# Patient Record
Sex: Female | Born: 1965
Health system: Southern US, Community
[De-identification: ages and names within clinical notes are randomized; demographics above are authoritative.]

## PROBLEM LIST (undated history)

## (undated) DIAGNOSIS — G4730 Sleep apnea, unspecified: Secondary | ICD-10-CM

## (undated) DIAGNOSIS — IMO0001 Reserved for inherently not codable concepts without codable children: Secondary | ICD-10-CM

## (undated) DIAGNOSIS — N951 Menopausal and female climacteric states: Secondary | ICD-10-CM

## (undated) DIAGNOSIS — Z8719 Personal history of other diseases of the digestive system: Secondary | ICD-10-CM

## (undated) DIAGNOSIS — E039 Hypothyroidism, unspecified: Secondary | ICD-10-CM

## (undated) DIAGNOSIS — K219 Gastro-esophageal reflux disease without esophagitis: Secondary | ICD-10-CM

## (undated) DIAGNOSIS — R7303 Prediabetes: Secondary | ICD-10-CM

## (undated) DIAGNOSIS — R112 Nausea with vomiting, unspecified: Secondary | ICD-10-CM

## (undated) DIAGNOSIS — B977 Papillomavirus as the cause of diseases classified elsewhere: Secondary | ICD-10-CM

## (undated) DIAGNOSIS — E785 Hyperlipidemia, unspecified: Secondary | ICD-10-CM

## (undated) DIAGNOSIS — N393 Stress incontinence (female) (male): Secondary | ICD-10-CM

## (undated) DIAGNOSIS — E119 Type 2 diabetes mellitus without complications: Secondary | ICD-10-CM

## (undated) DIAGNOSIS — F329 Major depressive disorder, single episode, unspecified: Secondary | ICD-10-CM

## (undated) DIAGNOSIS — F32A Depression, unspecified: Secondary | ICD-10-CM

## (undated) DIAGNOSIS — F909 Attention-deficit hyperactivity disorder, unspecified type: Secondary | ICD-10-CM

## (undated) DIAGNOSIS — S83289A Other tear of lateral meniscus, current injury, unspecified knee, initial encounter: Secondary | ICD-10-CM

## (undated) DIAGNOSIS — M1990 Unspecified osteoarthritis, unspecified site: Secondary | ICD-10-CM

## (undated) DIAGNOSIS — Z9889 Other specified postprocedural states: Secondary | ICD-10-CM

## (undated) DIAGNOSIS — F419 Anxiety disorder, unspecified: Secondary | ICD-10-CM

## (undated) HISTORY — PX: WISDOM TOOTH EXTRACTION: SHX21

## (undated) HISTORY — DX: Type 2 diabetes mellitus without complications: E11.9

## (undated) HISTORY — DX: Hyperlipidemia, unspecified: E78.5

## (undated) HISTORY — DX: Sleep apnea, unspecified: G47.30

## (undated) HISTORY — PX: OTHER SURGICAL HISTORY: SHX169

## (undated) HISTORY — DX: Menopausal and female climacteric states: N95.1

## (undated) HISTORY — DX: Anxiety disorder, unspecified: F41.9

## (undated) HISTORY — DX: Major depressive disorder, single episode, unspecified: F32.9

## (undated) HISTORY — DX: Depression, unspecified: F32.A

## (undated) HISTORY — PX: APPENDECTOMY: SHX54

---

## 1997-12-01 ENCOUNTER — Ambulatory Visit (HOSPITAL_COMMUNITY): Admission: RE | Admit: 1997-12-01 | Discharge: 1997-12-01 | Payer: Self-pay | Admitting: *Deleted

## 1999-04-04 ENCOUNTER — Other Ambulatory Visit: Admission: RE | Admit: 1999-04-04 | Discharge: 1999-04-04 | Payer: Self-pay | Admitting: Podiatry

## 1999-04-04 HISTORY — PX: EXCISION MORTON'S NEUROMA: SHX5013

## 1999-11-16 ENCOUNTER — Emergency Department (HOSPITAL_COMMUNITY): Admission: EM | Admit: 1999-11-16 | Discharge: 1999-11-16 | Payer: Self-pay | Admitting: Emergency Medicine

## 2000-06-05 ENCOUNTER — Other Ambulatory Visit: Admission: RE | Admit: 2000-06-05 | Discharge: 2000-06-05 | Payer: Self-pay | Admitting: *Deleted

## 2000-12-17 ENCOUNTER — Other Ambulatory Visit: Admission: RE | Admit: 2000-12-17 | Discharge: 2000-12-17 | Payer: Self-pay | Admitting: *Deleted

## 2000-12-17 ENCOUNTER — Encounter (INDEPENDENT_AMBULATORY_CARE_PROVIDER_SITE_OTHER): Payer: Self-pay

## 2001-05-10 ENCOUNTER — Encounter (INDEPENDENT_AMBULATORY_CARE_PROVIDER_SITE_OTHER): Payer: Self-pay | Admitting: Specialist

## 2001-05-10 ENCOUNTER — Ambulatory Visit (HOSPITAL_COMMUNITY): Admission: RE | Admit: 2001-05-10 | Discharge: 2001-05-10 | Payer: Self-pay | Admitting: *Deleted

## 2001-07-19 ENCOUNTER — Other Ambulatory Visit: Admission: RE | Admit: 2001-07-19 | Discharge: 2001-07-19 | Payer: Self-pay | Admitting: *Deleted

## 2001-11-17 ENCOUNTER — Encounter: Admission: RE | Admit: 2001-11-17 | Discharge: 2001-11-17 | Payer: Self-pay | Admitting: *Deleted

## 2002-02-16 ENCOUNTER — Emergency Department (HOSPITAL_COMMUNITY): Admission: EM | Admit: 2002-02-16 | Discharge: 2002-02-16 | Payer: Self-pay | Admitting: Emergency Medicine

## 2002-02-16 ENCOUNTER — Encounter: Payer: Self-pay | Admitting: Emergency Medicine

## 2002-03-23 ENCOUNTER — Encounter: Payer: Self-pay | Admitting: Emergency Medicine

## 2002-03-23 ENCOUNTER — Emergency Department (HOSPITAL_COMMUNITY): Admission: EM | Admit: 2002-03-23 | Discharge: 2002-03-23 | Payer: Self-pay | Admitting: *Deleted

## 2002-04-01 ENCOUNTER — Emergency Department (HOSPITAL_COMMUNITY): Admission: EM | Admit: 2002-04-01 | Discharge: 2002-04-01 | Payer: Self-pay | Admitting: Emergency Medicine

## 2002-07-21 ENCOUNTER — Other Ambulatory Visit: Admission: RE | Admit: 2002-07-21 | Discharge: 2002-07-21 | Payer: Self-pay | Admitting: *Deleted

## 2003-05-18 ENCOUNTER — Inpatient Hospital Stay (HOSPITAL_COMMUNITY): Admission: AD | Admit: 2003-05-18 | Discharge: 2003-05-18 | Payer: Self-pay | Admitting: Obstetrics and Gynecology

## 2003-05-18 ENCOUNTER — Encounter: Payer: Self-pay | Admitting: Obstetrics and Gynecology

## 2003-09-11 ENCOUNTER — Inpatient Hospital Stay (HOSPITAL_COMMUNITY): Admission: AD | Admit: 2003-09-11 | Discharge: 2003-09-15 | Payer: Self-pay | Admitting: *Deleted

## 2003-10-06 ENCOUNTER — Other Ambulatory Visit: Admission: RE | Admit: 2003-10-06 | Discharge: 2003-10-06 | Payer: Self-pay | Admitting: Obstetrics and Gynecology

## 2004-12-08 ENCOUNTER — Emergency Department (HOSPITAL_COMMUNITY): Admission: EM | Admit: 2004-12-08 | Discharge: 2004-12-08 | Payer: Self-pay | Admitting: Emergency Medicine

## 2005-03-05 ENCOUNTER — Ambulatory Visit: Payer: Self-pay | Admitting: Gastroenterology

## 2005-03-26 ENCOUNTER — Ambulatory Visit: Payer: Self-pay | Admitting: Gastroenterology

## 2005-03-31 ENCOUNTER — Ambulatory Visit: Payer: Self-pay | Admitting: Gastroenterology

## 2005-04-03 ENCOUNTER — Ambulatory Visit: Payer: Self-pay | Admitting: Gastroenterology

## 2005-04-03 ENCOUNTER — Encounter (INDEPENDENT_AMBULATORY_CARE_PROVIDER_SITE_OTHER): Payer: Self-pay | Admitting: Specialist

## 2005-04-24 ENCOUNTER — Ambulatory Visit: Payer: Self-pay | Admitting: Gastroenterology

## 2005-10-05 ENCOUNTER — Emergency Department (HOSPITAL_COMMUNITY): Admission: EM | Admit: 2005-10-05 | Discharge: 2005-10-05 | Payer: Self-pay | Admitting: Family Medicine

## 2006-10-21 ENCOUNTER — Emergency Department (HOSPITAL_COMMUNITY): Admission: EM | Admit: 2006-10-21 | Discharge: 2006-10-21 | Payer: Self-pay | Admitting: Family Medicine

## 2007-10-17 ENCOUNTER — Emergency Department (HOSPITAL_COMMUNITY): Admission: EM | Admit: 2007-10-17 | Discharge: 2007-10-17 | Payer: Self-pay | Admitting: Emergency Medicine

## 2008-06-05 ENCOUNTER — Encounter: Admission: RE | Admit: 2008-06-05 | Discharge: 2008-06-05 | Payer: Self-pay | Admitting: Obstetrics and Gynecology

## 2008-11-22 ENCOUNTER — Ambulatory Visit: Payer: Self-pay | Admitting: Family Medicine

## 2008-11-22 DIAGNOSIS — E785 Hyperlipidemia, unspecified: Secondary | ICD-10-CM | POA: Insufficient documentation

## 2008-11-22 DIAGNOSIS — H6690 Otitis media, unspecified, unspecified ear: Secondary | ICD-10-CM | POA: Insufficient documentation

## 2008-11-22 DIAGNOSIS — J019 Acute sinusitis, unspecified: Secondary | ICD-10-CM

## 2008-11-22 LAB — CONVERTED CEMR LAB: Rapid Strep: NEGATIVE

## 2008-12-26 ENCOUNTER — Ambulatory Visit: Payer: Self-pay | Admitting: Occupational Medicine

## 2008-12-26 DIAGNOSIS — M545 Low back pain, unspecified: Secondary | ICD-10-CM | POA: Insufficient documentation

## 2009-06-27 ENCOUNTER — Ambulatory Visit: Payer: Self-pay | Admitting: Family Medicine

## 2009-06-29 ENCOUNTER — Encounter: Payer: Self-pay | Admitting: Family Medicine

## 2009-09-28 ENCOUNTER — Ambulatory Visit: Payer: Self-pay | Admitting: Family Medicine

## 2009-09-28 DIAGNOSIS — J069 Acute upper respiratory infection, unspecified: Secondary | ICD-10-CM | POA: Insufficient documentation

## 2010-11-10 ENCOUNTER — Encounter: Payer: Self-pay | Admitting: Internal Medicine

## 2010-11-20 ENCOUNTER — Encounter: Payer: Self-pay | Admitting: Emergency Medicine

## 2010-11-20 ENCOUNTER — Ambulatory Visit (INDEPENDENT_AMBULATORY_CARE_PROVIDER_SITE_OTHER): Payer: Commercial Managed Care - PPO | Admitting: Emergency Medicine

## 2010-11-20 DIAGNOSIS — J069 Acute upper respiratory infection, unspecified: Secondary | ICD-10-CM

## 2010-11-21 ENCOUNTER — Encounter: Payer: Self-pay | Admitting: Emergency Medicine

## 2010-11-22 ENCOUNTER — Telehealth (INDEPENDENT_AMBULATORY_CARE_PROVIDER_SITE_OTHER): Payer: Self-pay | Admitting: *Deleted

## 2010-11-27 NOTE — Progress Notes (Signed)
  Phone Note Outgoing Call Call back at [facility] Phone [phone]   Call placed by: Lajean Saver RN,  November 22, 2010 1:10 PM Call placed to: Patient Summary of Call: CAllback: No answer, mesaage left with reason for call and call back with any questions or concerns. Throat culture negative

## 2010-11-27 NOTE — Assessment & Plan Note (Signed)
Summary: SORE THROAT,HEADACHE,EAR ACHE/TJ   Vital Signs:  Patient profile:   45 year old female Height:      55 inches Weight:      201 pounds O2 Sat:      98 % on Room air Temp:     98.8 degrees F oral Pulse rate:   79 / minute Resp:     14 per minute BP sitting:   111 / 77  (left arm) Cuff size:   regular  Vitals Entered By: Lajean Saver RN (November 20, 2010 2:26 PM)  O2 Flow:  Room air History of Present Illness Chief Complaint: sore throat, left ear pain, HA History of Present Illness: 45 Years Old Female complains of onset of cold symptoms for 2 days.  Sherrel has been using no OTC meds.  Her son Whitney Post had strep last week. + sore throat No cough No pleuritic pain No wheezing + nasal congestion + post-nasal drainage + sinus pain/pressure No chest congestion No itchy/red eyes No earache No hemoptysis No SOB No chills/sweats No fever No nausea No vomiting No abdominal pain No diarrhea No skin rashes No fatigue No myalgias No headache    CC: sore throat, left ear pain, HA   Allergies: No Known Drug Allergies  Past History:  Past Medical History: Hyperlipidemia sleep apnea anxiety/depression  Past Surgical History: Reviewed history from 12/26/2008 and no changes required. Appendectomy Feet Surgery Wisdom Teeth removed Laproscopy  Family History: Reviewed history from 12/26/2008 and no changes required. Mother healthy Father healthy  Social History: Reviewed history from 09/28/2009 and no changes required. Divorced Never Smoked Alcohol use-yes Drug use-yes Regular exercise-no  Review of Systems       patient c/o sore throat and HA and left ear pain x last night  Physical Exam  General:  Well-developed,well-nourished,in no acute distress; alert,appropriate and cooperative throughout examination Ears:  External ear exam shows no significant lesions or deformities.  Otoscopic examination reveals clear canals, tympanic membranes are  intact bilaterally without bulging, retraction, inflammation or discharge. Hearing is grossly normal bilaterally. Nose:  External nasal examination shows no deformity or inflammation. Nasal mucosa are pink and moist without lesions or exudates. Mouth:  Oral mucosa and oropharynx without lesions or exudates.  Teeth in good repair. Lungs:  Normal respiratory effort, chest expands symmetrically. Lungs are clear to auscultation, no crackles or wheezes. Heart:  Normal rate and regular rhythm. S1 and S2 normal without gallop, murmur, click, rub or other extra sounds. Cervical Nodes:  No lymphadenopathy noted Psych:  Cognition and judgment appear intact. Alert and cooperative with normal attention span and concentration. No apparent delusions, illusions, hallucinations   Impression & Recommendations:  Problem # 1:  URI (ICD-465.9) 1)  No antibiotic given today.  Rapid strep negative.  Culture is pending.  Will call patient in 2-3 days with results. 2)  Use nasal saline solution (over the counter) at least 3 times a day. 3)  Use over the counter decongestants like Zyrtec-D every 12 hours as needed to help with congestion. 4)  Can take tylenol every 6 hours or motrin every 8 hours for pain or fever. 5)  Follow up with your primary doctor  if no improvement in 5-7 days, sooner if increasing pain, fever, or new symptoms.    Orders: Rapid Strep (81191) T-Culture, Throat ([id number])  Complete Medication List: 1)  Xanax 0.5 Mg Tabs (Alprazolam) .Marland Kitchen.. 1 tab by mouth once daily 2)  Zoloft 100 Mg Tabs (Sertraline hcl) .Marland KitchenMarland KitchenMarland Kitchen 1  1/2 tab by mouth once daily 3)  Vitamin D 16109 Unit Caps (Ergocalciferol) .Marland Kitchen.. 1 tab by mouth once daily 4)  Adderall 30 Mg Tabs (Amphetamine-dextroamphetamine) .Marland Kitchen.. 1bid 5)  Azithromycin 250 Mg Tabs (Azithromycin) .... Two tabs by mouth on day 1, then 1 tab daily on days 2 through 5   Orders Added: 1)  Est. Patient Level III [60454] 2)  Rapid Strep [09811] 3)  T-Culture,  Throat [[id number]]    Laboratory Results  Date/Time Received: November 20, 2010 2:36 PM  Date/Time Reported: November 20, 2010 2:36 PM   Other Tests  Rapid Strep: negative  Kit Test Internal QC: Negative   (Normal Range: Negative)

## 2011-03-05 ENCOUNTER — Encounter: Payer: Self-pay | Admitting: Emergency Medicine

## 2011-03-05 ENCOUNTER — Inpatient Hospital Stay (INDEPENDENT_AMBULATORY_CARE_PROVIDER_SITE_OTHER)
Admission: RE | Admit: 2011-03-05 | Discharge: 2011-03-05 | Disposition: A | Discharge status: Home / Self Care | Payer: Commercial Managed Care - PPO | Source: Ambulatory Visit | Attending: Emergency Medicine | Admitting: Emergency Medicine

## 2011-03-05 DIAGNOSIS — J069 Acute upper respiratory infection, unspecified: Secondary | ICD-10-CM

## 2011-03-05 LAB — CONVERTED CEMR LAB: Rapid Strep: NEGATIVE

## 2011-03-07 ENCOUNTER — Telehealth (INDEPENDENT_AMBULATORY_CARE_PROVIDER_SITE_OTHER): Payer: Self-pay | Admitting: Emergency Medicine

## 2011-03-07 NOTE — Op Note (Signed)
NAME:  Glazer, Addilee D                               ACCOUNT NO.:  [id number]   MEDICAL RECORD NO.:  [id number]                   PATIENT TYPE:  INP   LOCATION:  9102                                 FACILITY:  [facility]   PHYSICIAN:  [place] B. Earlene Plater, M.D.               DATE OF BIRTH:  November 30, 1965   DATE OF PROCEDURE:  09/12/2003  DATE OF DISCHARGE:                                 OPERATIVE REPORT   PREOPERATIVE DIAGNOSES:  1. Term intrauterine pregnancy.  2. Gestational hypertension.  3. Suspected macrosomia.  4. Transverse position.  5. Failure to descend.  6. Repetitive variable decelerations.   POSTOPERATIVE DIAGNOSES:  1. Term intrauterine pregnancy.  2. Gestational hypertension.  3. Suspected macrosomia.  4. Transverse position.  5. Failure to descend.  6. Repetitive variable decelerations.   PROCEDURE:  Primary low transverse cesarean section.   SURGEON:  Chester Holstein. Earlene Plater, M.D.   ASSISTANT:  Pershing Cox, M.D.   ANESTHESIA:  Epidural.   FINDINGS:  Viable female infant; weight 8 pounds 12 ounces.  Apgars 8 and 9.  Cord pH 7.23.   Fibroid uterus with a 5-6 cm anterior lower uterine segment fibroid.  Normal  appearing tubes and ovaries.   INDICATIONS:  The patient was being induced at 39+ weeks for the above  issues.  She did progress to complete and was pushing for about an hour and  a half, when she was found to have repetitive variable decelerations, at  times into the 60s.  The fetal vertex was not descending much below plus-2  station for over about an hour and a half.  Given the concern regarding  fetal heart tracing and lack of descent, I recommended we proceed with a  cesarean section.  Prior to this, attempts at manual rotation from the  transverse position were unsuccessful.   PROCEDURE:  The patient was taken to the operating room with epidural  anesthesia in place.  She was prepped and draped in standard fashion and a  Foley catheter was in the bladder.  A  Pfannenstiel incision was made with  the knife and carried sharply to the fascia.  The fascia was divided  sharply.  The superior aspect of the fascial incision was elevated with  Kocher clamps, and the underlying rectus muscles dissected off sharply.  Repeated inferiorly in a similar fashion.  Midline identified at the  superior aspect of the incision and entered sharply.  Extended inferiorly  sharply with good visualization of surrounding organs.   Bladder blade inserted.  Bladder flap created with sharp and blunt  technique.  Uterine incision made in a low transverse fashion with the  knife.  Clear fluid noted at the amniotomy incision and extended laterally  with bandage scissors.  The fetal vertex was elevated through the incision  with the assistance of the Kiwi vacuum.  The vertex was delivered.  It  was  necessary to partially divide the medial aspect of each rectus muscle to  allow additional room to facilitate the head. There were no pop offs  encountered, and approximately three tractions necessary.  The device was  placed over the flexion point in the mid-green zone.  A nuchal cord x2 was  noted.  The remainder of the infant was delivered by first delivering the  anterior arm, and then the remainder of the trunk.  The cord was clamped and  cut, and the infant handed off to the awaiting pediatricians.  Arterial cord  pH obtained.   The placenta was removed manually and the uterus exteriorized and cleared of  all clots and debris.  The uterine incision was free of extension.   It was noted that there was an anterior lower uterine segment fibroid  directly in the path of delivery, and I suspect this may have been somewhat  obstructing, and may have contributed to failure to descend as did the  transverse position and large fetus.   The uterine incision was closed in a running, locked fashion with 0  Monocryl, and a second embrocating layer placed with the same suture.  Bleeder  noted in the midline of the incision and made hemostatic with two  figure-of-eight sutures of 0 Monocryl.   The uterus was returned to the abdomen.  The pelvis was irrigated.  The  incision was inspected, as was the bladder flap.  Both were hemostatic.  Subfascial space was hemostatic.   The fascia was closed in a running stitch of 0 Vicryl.  The subcutaneous  tissue was irrigated and made hemostatic with the Bovie.  The skin was  closed with staples.   The patient tolerated the procedure well and there were no complications.  She was taken to the recovery room awake, alert and in stable condition.  All counts were correct, per the operating room staff.                                               Gerri Spore B. Earlene Plater, M.D.    WBD/MEDQ  D:  09/12/2003  T:  09/12/2003  Job:  [id number]

## 2011-03-07 NOTE — Discharge Summary (Signed)
NAME:  Pulido, Nikya D                               ACCOUNT NO.:  [id number]   MEDICAL RECORD NO.:  [id number]                   PATIENT TYPE:  INP   LOCATION:  9102                                 FACILITY:  [facility]   PHYSICIAN:  [place] B. Earlene Plater, M.D.               DATE OF BIRTH:  Aug 19, 1966   DATE OF ADMISSION:  09/11/2003  DATE OF DISCHARGE:  09/15/2003                                 DISCHARGE SUMMARY   ADMISSION DIAGNOSES:  1. A 39 week intrauterine pregnancy.  2. Polyhydramnios and suspect large for gestational age fetus.  3. Elevated blood pressure and complaints of headache with trace     proteinuria.   DISCHARGE DIAGNOSES:  1. A 39 week intrauterine pregnancy.  2. Polyhydramnios and suspect large for gestational age fetus.  3. Elevated blood pressure and complaints of headache with trace     proteinuria.   PROCEDURE:  1. Admission for induction of labor.  2. Cesarean section for failure to descend and repetitive variable     decelerations.   HISTORY OF PRESENT ILLNESS:  A 45 year old white female gravida 1, para 0,  39+ weeks admitted from the office with blood pressure 140/90, trace  protein, and complaints of a headache without visual disturbances or upper  abdominal complaint of pain. The patient had an unfavorable cervix and was  admitted for cervical ripening and subsequent Pitocin.   HOSPITAL COURSE:  The patient was admitted and PIS labs checked and were  normal. Blood pressure improved with hospitalization and induction  proceeded. The subsequent morning Pitocin was started. Amniotomy performed  and patient progressed well to complete. She pushed for approximately an  hour and a half. There was no descent beyond +1 station. ROT position noted.  In addition, the patient was having repetitive variable decelerations. Given  these concerns, I recommended we proceed with a primary cesarean section.  Findings at the time of surgery included a viable female, Apgar's 8 and 9,  weight 8 pounds 12 ounces, ROT position. Arterial pH 7.23. Postoperatively,  the patient rapidly regained her ability to ambulate, void and tolerate a  regular diet. Her blood pressure's remained normal postpartum and she was  discharged home on the third postoperative day in satisfactory condition.   DISCHARGE MEDICATIONS:  Tylox 1 to 2 p.o. q. 4 to 6 hours p.r.n. pain.   FOLLOW UP:  [facility] in 4 weeks.   DISCHARGE INSTRUCTIONS:  Standard pre-printed instructions given prior to  dismissal.   CONDITION ON DISCHARGE:  Satisfactory.                                               Gerri Spore B. Earlene Plater, M.D.    WBD/MEDQ  D:  10/11/2003  T:  10/11/2003  Job:  [id number] 

## 2011-03-07 NOTE — H&P (Signed)
[name], [name]                               ACCOUNT NO.:  [id number]   MEDICAL RECORD NO.:  [id number]                   PATIENT TYPE:   LOCATION:                                       [facility]ACILITY:  [facility]   PHYSICIAN:  Shannon [name], M.D.               DATE OF BIRTH:  1966-05-20   DATE OF ADMISSION:  09/11/2003  DATE OF DISCHARGE:                                HISTORY & PHYSICAL   ADMISSION DIAGNOSES:  1. Thirty-nine-week intrauterine pregnancy.  2. Polyhydramnios and suspected large for gestational age fetus.  3. Elevated blood pressure and complaints of headache with trace     proteinuria.   HISTORY OF PRESENT ILLNESS:  [age]-year-old white female gravida 1  para 0 at 64 plus weeks seen in the office today in routine followup with a  history of polyhydramnios and probable LGA fetus.  Noted to have a blood  pressure 140/90, trace protein and complaints of a headache without visual  disturbances or upper abdominal pain.  Plan had been for induction given  concerns about the fetus size.  Considering the new onset of hypertension  and complaints of headache I recommend we proceed with this tonight.  Patient in agreement.  Her cervix is unfavorable therefore, we will proceed  with ripening tonight and Pitocin in the morning.   PAST MEDICAL HISTORY:  1. Obesity.  2. Depression.  3. Infertility.  4. Anterior lower uterine segment fibroid.  5. IBS.   FAMILY HISTORY:  Heart disease, diabetes, ovarian cancer in a cousin,  depression.   SURGICAL HISTORY:  Appendectomy in 1975 and foot surgery in 2001.   REVIEW OF SYSTEMS:  Otherwise negative.   PRENATAL LABORATORIES:  Blood type is O positive, group B strep is negative,  glucola normal.   PHYSICAL EXAMINATION:  VITAL SIGNS:  Blood pressure 140/90.  Fetal heart  tones 140s.  Weight 248.  GENERAL:  Alert and oriented; no acute distress.  SKIN:  Warm and dry; no lesions.  HEART:  Regular rate and rhythm.  LUNGS:  Clear to  auscultation.  ABDOMEN:  Obese, fundal height 42 cm.  CERVIX:  One centimeter dilated, 50% effaced, -2 station, and vertex.   Estimated fetal weight on August 31, 2003 of 3538 g (7 pounds 13 ounces)  62nd percentile with polyhydramnios with an AFI of 32.   ASSESSMENT:  1. Thirty-nine-plus week intrauterine pregnancy.  2. New onset hypertension and complaints of headache.  3. Known polyhydramnios and probable large for gestational age fetus with     unfavorable cervix.   PLAN:  Admission for cervical ripening induction of labor and check PIH  labs.  Shannon [name] B. Shannon [name], M.D.    WBD/MEDQ  D:  09/11/2003  T:  09/11/2003  Job:  [id number]

## 2011-03-07 NOTE — Consult Note (Signed)
   NAMEDAFNA, ROMO                               ACCOUNT NO.:  [id number]   MEDICAL RECORD NO.:  [id number]                   PATIENT TYPE:  MAT   LOCATION:  [facility]                                 [facility]ACILITY:  [facility]   PHYSICIAN:  Lenoard Aden, M.D.             DATE OF BIRTH:  1966-06-20   DATE OF CONSULTATION:  05/18/2003  DATE OF DISCHARGE:                                   CONSULTATION   CHIEF COMPLAINT:  Bleeding.   HISTORY OF PRESENT ILLNESS:  The patient is a 45 year old, white female, G1,  P0, EDD September 11, 2003, at 22 weeks with diarrhea and vaginal spotting  this morning.   ALLERGIES:  She has no known drug allergies.   MEDICATIONS:  Prenatal vitamins, Prozac, and BuSpar.   OBSTETRICAL HISTORY:  History of Clomid use with pregnancy.   PAST SURGICAL HISTORY:  1. History of appendectomy.  2. Foot surgery.   PAST MEDICAL HISTORY:  1. History of depression and anxiety.  2. Irritable bowel syndrome.   FAMILY HISTORY:  Myocardial infarction and heart disease.   PRENATAL LABORATORY DATA:  Blood type O positive.  Rubella immune.  Hepatitis and HIV negative.   PHYSICAL EXAMINATION:  GENERAL APPEARANCE:  She is a well-developed, well-  nourished, white female in no acute distress.  HEENT:  Normal.  LUNGS:  Clear.  HEART:  Regular rate and rhythm.  ABDOMEN:  Soft, gravid, and nontender.  PELVIC:  The speculum exam reveals a small amount of bloody mucus.  No  active bleeding.  The cervix is closed, 3 cm long, and soft.  EXTREMITIES:  No cords.  NEUROLOGIC:  Nonfocal.   LABORATORY DATA:  Toco reveals no evidence of fetal contractions.  Ultrasound reveals an appropriate for gestational age fetus at 13 weeks with  normal AFI, breech presenting, anterior placenta, and an anterior uterine  fibroid measuring 6 x 6 cm.   IMPRESSION:  Second trimester bleeding of unknown etiology.  No evidence of  cervical change.  No evidence of preterm labor.   PLAN:  1. Out of work.  2. Cautious observation.  3. Follow up in the office in 24 hours.  4. Strict preterm labor warnings given.  5. Reassurance given.                                               Lenoard Aden, M.D.    RJT/MEDQ  D:  05/18/2003  T:  05/18/2003  Job:  [id number]   cc:   [facility]

## 2011-03-07 NOTE — Op Note (Signed)
[facility]  Patient:    Shannon [name], Shannon [name]                            MRN: [id number] Proc. Date: 05/10/01 Adm. Date:  [id number] Attending:  Marin Comment CC:         Lenoard Aden, M.D.  Marinus Maw, M.D.   Operative Report  PREOPERATIVE DIAGNOSES:       1. Pelvic pain.                               2. Dyspareunia.                               3. History of cervical endometriosis.                               4. Infertility.  POSTOPERATIVE DIAGNOSES:      1. Pelvic pain.                               2. Dyspareunia.                               3. History of cervical endometriosis.                               4. History of infertility.                               5. Multiple adhesions of the pelvis including                                  both fallopian tubes and ovaries with                                  adhesions to the pelvic sidewall, uterine                                  fundus, and rectosigmoid.  OPERATION:                    1. Diagnostic laparoscopy.                               2. Extensive lysis of adhesions.                               3. Chromotubation.  SURGEON:                      Pershing Cox, M.D.  ASSISTANT:                    Lenoard Aden, M.D.  ANESTHESIA:  General endotracheal anesthesia.  INDICATIONS:                  Shannon [name] is a 45 year old female who is followed in my office for routine gynecologic care.  She has had significant pelvic pain making intercourse quite difficult.  She also had a diagnosis of cervical endometriosis made at the time of evaluation for persistent postcoital bleeding.  While in the operating room, we will look at her fallopian tubes to see if they are patent.  The diagnostic laparoscopy was successful.  There were no significant adhesions in the upper abdomen prohibiting our ability to examine the pelvis.  Once the appropriate  instrumentation was available, we were able to elevate the uterus, and there were adhesions of the uterine fundus to the posterior cul-de-sac.  There was a 1 cm bleb-like lesion which was multiseptated, lying on the uterine fundus.  There was adhesive disease from the uterine fundus in a band extending to the posterior cul-de-sac.  The left fallopian tube and ovary were involved in adhesions which extended from the rectosigmoid to the anterior abdominal wall over the round ligament.  The fallopian tube and ovary had adhesions to one another, and the ovary was densely adherent to the sidewall with these adhesions.  All of these adhesions were soft and could be lifted and, with careful dissection, could be removed so that the ovary was free from the sidewall at the time of completing the procedure.  The left ovary was adhesed also along the sidewall extending to the level of the uterosacral ligament on that side, and adhesions were taken down separating the ovary from this band of tissue along the lower broad ligament on the left.  Adhesive disease of the right ovary was also impressive but not nearly as complicated as that on the left.  The ovary had dense adhesions to the sidewall and to the uterosacral ligament.  These were taken down successfully with careful dissection.  Both fallopian tubes were normal in appearance.  Both ovaries were normal in appearance.  The uterus itself was slightly enlarged but no evidence of submucosal myomas.  Both fallopian tubes spilled a small amount of dye under significant pressure.  DESCRIPTION OF PROCEDURE:     Ninah Funari was brought to the operating room with an IV in placed.  She had received 1 g of Ancef in the holding area.  She was placed supine on the OR table, and general endotracheal anesthesia was administered without difficulty.  She was then placed into Allen stirrups, and the anterior abdominal wall, perineum, upper thighs, and vagina were  prepped with a solution of Hibiclens.  Red rubber catheter was used to empty the bladder.  An acorn device was fitted to the cervix for later chromotubation, and this device was used to elevate the uterus during the procedure.  The umbilicus was everted, and Marcaine was instilled into the skin.  A curvilinear incision was made in the umbilicus.  The abdominal wall was lifted, and the Veress needle was inserted easily into the abdomen.  Hanging drop technique was used to discern that we were indeed in the peritoneal cavity and the pressures were low.  CO2 gas was then insufflated until a pressure of 18 was noted, and 6 liters of gas had been instilled.  Through this umbilical incision, a 10-11 trocar was introduced for diagnostic scope placement.  Pictures were taken upon entry to verify that there had been no injury to underlying structures.  The  dome of the liver was also photographed to expose this area.  The uterus could not be adequately lifted.  A 5 mm trocar was placed superior to the bladder, and a probe was used to help lift the uterine fundus.  There were adhesions as noted above connecting the fundus to the cul-de-sac, and, therefore, another 5 mm probe was placed into the right lower quadrant and, through this, the operative instruments were passed.  Using the hook scissors and the probe or grasping forceps to lift the structures, all of the adhesions from the uterine fundus to the cul-de-sac were taken down.  These adhesions were collected for a specimen.  This included the bleb on the fundus of the uterus.  Once the uterus could, therefore, be lifted, we were able to see the cul-de-sac which was clean. There was no evidence of endometriosis in the deep cul-de-sac or along the uterosacral ligaments.  There was dense adhesive disease, especially on the left, and we began anteriorly taking down the adhesions of the rectosigmoid to the anterior abdominal wall just above the  round ligament.  All of this was  done with hook scissors and retraction.  We were then able to lift the rectosigmoid off the fallopian tube and ovary, and adhesions were taken down from the rectosigmoid to the adnexal structures.  We were then able to take the adhesions from the fallopian tube to the ovary which were quite filmy. The ovary was then lifted; and, using the hook scissors, we were able to lift it from the sidewall of the pelvis by careful dissection over the sidewall structures, staying very close to the ovary.  Once this had been completed, an additional two layers of adhesions were taken down in separate strips, and, when this was free, the entire ovary could be lifted freely from the sidewall. Additional adhesions were noted near the insertion of the fallopian tube; and, taking care to avoid the fallopian tube, these adhesions were taken down with hook scissors as well.  The adhesions extended along the broad ligament to the left uterosacral ligament, and these adhesions were taken down.  At no point did we see any sign of active endometriosis, just adhesions reminiscent of scarring from either old pelvic disease or endometriosis.  On the patients right, the ovary was lifted by the utero-ovarian ligament, and the hook scissors were used to taken down a layer of adhesions which extended the entire length of the ovary.  The fallopian tube was not involved in these adhesions.  Once these adhesions had been taken down, the ovary was completely free from the sidewall.  Nezhat was used to irrigate the pelvis.  We then injected 120 cc of indigo carmine to check for spill.  It required the entire amount in order for [place] to see small spill from both fallopian tubes.  The distal fallopian tubes were normal in appearance.  We released the pressure so that the surfaces of the pelvis could be visualized where the adhesions had been taken down, and there was no active bleeding.  All of  the wash material was evacuated, and 350 cc of Interceed gel was injected through the midline port to coat the cul-de-sac, posterior surface of the uterus, and the ovarian structures.  The patient was placed into reverse Trendelenburg and kept there during this procedure. Trocars were removed.  The fascia beneath the umbilical port was grasped; and, using a UR6 needle, the fascia was closed.  Subcutaneous tissues were closed. The skin sites  in all three areas were brought together.  The umbilicus was closely approximated and did not require further suturing.  The skin incisions from the 5 mm port were closed with Dermabond. DD:  05/10/01 TD:  05/11/01 Job: [id number]

## 2011-09-22 NOTE — Telephone Encounter (Signed)
  Phone Note Outgoing Call Call back at Home Phone [phone] [facility]     Call placed by: Emilio Math,  Mar 07, 2011 12:41 PM Call placed to: Patient Summary of Call: left msg hope she's feeling better call with any questions or concerns

## 2011-09-22 NOTE — Progress Notes (Signed)
Summary: SORE THROAT,COUGH,CONGESTION/TJ  (room 4)   Vital Signs:  Patient Profile:   45 Years Old Female CC:      Sore throat and low grade fever x 4days Height:     55 inches Weight:      208.25 pounds O2 Sat:      97 % O2 treatment:    Room Air Temp:     99.2 degrees F oral Pulse rate:   91 / minute Resp:     16 per minute BP sitting:   132 / 76  (left arm) Cuff size:   regular  Pt. in pain?   yes    Location:   throat    Intensity:   7    Type:       sharp  Vitals Entered By: Lavell Islam RN (Mar 05, 2011 6:24 PM)                   Current Allergies: No known allergies History of Present Illness History from: patient Chief Complaint: Sore throat and low grade fever x 4days History of Present Illness: 45 Years Old Female complains of onset of cold symptoms for 4 days.  Joee has been using Mucines which is helping a little bit. ++ sore throat No cough No pleuritic pain No wheezing + nasal congestion + post-nasal drainage + sinus pain/pressure No chest congestion No itchy/red eyes No earache No hemoptysis No SOB + chills/sweats + fever No nausea No vomiting No abdominal pain No diarrhea No skin rashes No fatigue + myalgias + headache   REVIEW OF SYSTEMS Constitutional Symptoms      Denies fever, chills, night sweats, weight loss, weight gain, and fatigue.  Eyes       Denies change in vision, eye pain, eye discharge, glasses, contact lenses, and eye surgery. Ear/Nose/Throat/Mouth       Complains of ear pain and sore throat.      Denies hearing loss/aids, change in hearing, ear discharge, dizziness, frequent runny nose, frequent nose bleeds, sinus problems, hoarseness, and tooth pain or bleeding.  Respiratory       Denies dry cough, productive cough, wheezing, shortness of breath, asthma, bronchitis, and emphysema/COPD.  Cardiovascular       Denies murmurs, chest pain, and tires easily with exhertion.    Gastrointestinal       Denies stomach  pain, nausea/vomiting, diarrhea, constipation, blood in bowel movements, and indigestion. Genitourniary       Denies painful urination, kidney stones, and loss of urinary control. Neurological       Denies paralysis, seizures, and fainting/blackouts. Musculoskeletal       Denies muscle pain, joint pain, joint stiffness, decreased range of motion, redness, swelling, muscle weakness, and gout.  Skin       Denies bruising, unusual mles/lumps or sores, and hair/skin or nail changes.  Psych       Denies mood changes, temper/anger issues, anxiety/stress, speech problems, depression, and sleep problems. Other Comments: Sore throat, low grade temp x 4 days   Past History:  Past Medical History: Last updated: 11/20/2010 Hyperlipidemia sleep apnea anxiety/depression  Family History: Last updated: 12/26/2008 Mother healthy Father healthy  Social History: Last updated: 09/28/2009 Divorced Never Smoked Alcohol use-yes Drug use-yes Regular exercise-no  Past Surgical History: Reviewed history from 12/26/2008 and no changes required. Appendectomy Feet Surgery Wisdom Teeth removed Laproscopy  Family History: Reviewed history from 12/26/2008 and no changes required. Mother healthy Father healthy  Social History: Reviewed history from 09/28/2009  and no changes required. Divorced Never Smoked Alcohol use-yes Drug use-yes Regular exercise-no Physical Exam General appearance: well developed, well nourished, no acute distress Ears: normal, no lesions or deformities Nasal: mucosa pink, nonedematous, no septal deviation, turbinates normal Oral/Pharynx: pharyngeal erythema with exudate, uvula midline without deviation Neck: ant cerv bil tenderness and LAD Chest/Lungs: no rales, wheezes, or rhonchi bilateral, breath sounds equal without effort Heart: regular rate and  rhythm, no murmur MSE: oriented to time, place, and person  Plan New Medications/Changes: AMOXICILLIN 875 MG  TABS (AMOXICILLIN) 1 by mouth two times a day for 7 days  #14 x 0, 03/05/2011, Hoyt Koch MD  New Orders: Est. Patient Level IV [78469] Pulse Oximetry (single measurment) [94760] Services provided After hours-Weekends-Holidays [99051] Rapid Strep [87880] T-Culture, Throat [[id number]] Planning Comments:   1)  Take the prescribed antibiotic as instructed.  Hold until tomorrow.  Start Sudafed as well to help with the congestion. 2)  Use nasal saline solution (over the counter) at least 3 times a day. 3)  Increase hydration 4)  Can take tylenol every 6 hours or motrin every 8 hours for pain or fever. 5)  Follow up with your primary doctor  if no improvement in 5-7 days, sooner if increasing pain, fever, or new symptoms.    The patient and/or caregiver has been counseled thoroughly with regard to medications prescribed including dosage, schedule, interactions, rationale for use, and possible side effects and they verbalize understanding.  Diagnoses and expected course of recovery discussed and will return if not improved as expected or if the condition worsens. Patient and/or caregiver verbalized understanding.  Prescriptions: AMOXICILLIN 875 MG TABS (AMOXICILLIN) 1 by mouth two times a day for 7 days  #14 x 0   Entered and Authorized by:   Hoyt Koch MD   Signed by:   Hoyt Koch MD on 03/05/2011   Method used:   Print then Give to Patient   RxID:   [id number]   Orders Added: 1)  Est. Patient Level IV [47425] 2)  Pulse Oximetry (single measurment) [94760] 3)  Services provided After hours-Weekends-Holidays [99051] 4)  Rapid Strep [87880] 5)  T-Culture, Throat [[id number]]    Laboratory Results  Date/Time Received: Mar 05, 2011 6:38 PM  Date/Time Reported: Mar 05, 2011 6:38 PM   Other Tests  Rapid Strep: negative Comments: sore throat x 4 days  Kit Test Internal QC: Negative   (Normal Range: Negative)

## 2011-12-30 ENCOUNTER — Ambulatory Visit: Payer: Commercial Managed Care - PPO | Admitting: Physician Assistant

## 2011-12-30 DIAGNOSIS — Z0289 Encounter for other administrative examinations: Secondary | ICD-10-CM

## 2012-01-30 ENCOUNTER — Encounter: Payer: Self-pay | Admitting: *Deleted

## 2012-02-05 ENCOUNTER — Encounter: Payer: Self-pay | Admitting: Family Medicine

## 2012-02-05 ENCOUNTER — Ambulatory Visit (INDEPENDENT_AMBULATORY_CARE_PROVIDER_SITE_OTHER): Payer: Commercial Managed Care - PPO | Admitting: Family Medicine

## 2012-02-05 VITALS — BP 116/69 | HR 87 | Ht 65.5 in | Wt 209.0 lb

## 2012-02-05 DIAGNOSIS — G4730 Sleep apnea, unspecified: Secondary | ICD-10-CM

## 2012-02-05 DIAGNOSIS — F39 Unspecified mood [affective] disorder: Secondary | ICD-10-CM | POA: Insufficient documentation

## 2012-02-05 NOTE — Progress Notes (Signed)
  Subjective:    Patient ID: Shannon [name], female    DOB: 09-Dec-1965, 46 y.o.   MRN: [id number]  HPI Patient is here because of sleep apnea trouble. She was evaluated for sleep apnea in 2009 but she has noticed increased fatigue increased tiredness over the last year. She realizes she's not been as faithful or compliant as she should have been in using her sleep apnea machine but even when she does she does not notice a big improvement with her fatigue. Her SO has noticed increased snoring as well. She has not had the machine tested since 2009 as far as pressure.   Review of Systems  Constitutional: Positive for fatigue.  Respiratory: Positive for apnea and shortness of breath.   Musculoskeletal: Positive for back pain.  Psychiatric/Behavioral: Positive for sleep disturbance.  All other systems reviewed and are negative.      BP 116/69  Pulse 87  Ht 5' 5.5" (1.664 m)  Wt 209 lb (94.802 kg)  BMI 34.25 kg/m2  SpO2 97%  LMP 01/23/2012 Objective:   Physical Exam  Constitutional: She is oriented to person, place, and time. She appears well-developed and well-nourished.  HENT:  Head: Normocephalic and atraumatic.  Eyes: Pupils are equal, round, and reactive to light.  Neck: Normal range of motion. Neck supple. No thyromegaly present.  Cardiovascular: Normal rate, regular rhythm and normal heart sounds.   Pulmonary/Chest: Effort normal and breath sounds normal. No respiratory distress. She has no wheezes.  Neurological: She is alert and oriented to person, place, and time.  Skin: Skin is warm and dry.  Psychiatric: She has a normal mood and affect.          Assessment & Plan:  #1 make referral back to [facility] to have the pressure and the machine and equipment recheck. I will send him lab work She reports that her previous physician evaluated her for fatigue and obtain TSH and B12 levels on her.

## 2012-02-05 NOTE — Patient Instructions (Signed)
Sleep Apnea Sleep apnea is a common disorder. The main problem of this disorder is excessive daytime sleepiness and compromised quality of life. This may include social and emotional problems. There are two types of sleep apnea.  Obstructive sleep apnea is when breathing stops due to a blocked airway.   Central sleep apnea is a malfunction of the brain's normal signal to breathe.  SYMPTOMS  Restless sleep.   Falling asleep while driving and/or during the day.   Loss of energy.   Irritability.   Mood or behavior changes.   Loud, heavy snoring.   Morning headaches.   Trouble concentrating.   Forgetfulness.   Anxiety or depression.   Decreased interest in sex.  Not all people with sleep apnea have all of these symptoms. However, people who have a few of these symptoms should visit their caregiver for an evaluation. Problems related to untreated sleep apnea include:  High blood pressure (hypertension).   Coronary artery disease.   Impotence.   Cognitive dysfunction.   Memory loss.  TREATMENT  For mild cases, treatment may include avoiding sleeping on one's back.   For people with nasal congestion, a decongestant may be prescribed.   Patients with obstructive and central apnea should avoid depressants. This includes alcohol, sedatives and narcotics. Weight loss and diet control are encouraged for overweight patients.   Many serious cases of obstructive sleep apnea can be relieved by a treatment called nasal continuous positive airway pressure (nasal CPAP). Nasal CPAP uses a mask-like device and pump that work together to keep the airway open. The pump delivers air pressure during each breath.   Surgery may help some patients by stopping or reducing the narrowing of the airway due to anatomical defects.  PROGNOSIS  Removing the obstruction usually reverses hypertension and cardiac problems. Untreated, sleep apnea sufferers have a tendency to fall asleep during the day.  This is can result in serious accident or loss of ones job. RESEARCH Sleep apnea is currently one of the most active areas of sleep research.  Document Released: 09/26/2002 Document Revised: 09/25/2011 Document Reviewed: 01/22/2006 [employer] Patient Information 2012 [employer]. 

## 2012-03-05 ENCOUNTER — Encounter: Payer: Self-pay | Admitting: *Deleted

## 2012-03-09 ENCOUNTER — Encounter: Payer: Self-pay | Admitting: Family Medicine

## 2012-03-09 ENCOUNTER — Ambulatory Visit (INDEPENDENT_AMBULATORY_CARE_PROVIDER_SITE_OTHER): Payer: 59 | Admitting: Family Medicine

## 2012-03-09 VITALS — BP 110/68 | HR 77 | Ht 65.5 in | Wt 216.0 lb

## 2012-03-09 DIAGNOSIS — R5381 Other malaise: Secondary | ICD-10-CM

## 2012-03-09 DIAGNOSIS — R7309 Other abnormal glucose: Secondary | ICD-10-CM

## 2012-03-09 DIAGNOSIS — R5383 Other fatigue: Secondary | ICD-10-CM

## 2012-03-09 DIAGNOSIS — R739 Hyperglycemia, unspecified: Secondary | ICD-10-CM

## 2012-03-09 LAB — CBC WITH DIFFERENTIAL/PLATELET
Basophils Absolute: 0.1 10*3/uL (ref 0.0–0.1)
Eosinophils Relative: 5 % (ref 0–5)
Lymphocytes Relative: 31 % (ref 12–46)
Lymphs Abs: 2.6 10*3/uL (ref 0.7–4.0)
MCV: 87.6 fL (ref 78.0–100.0)
Neutro Abs: 4.8 10*3/uL (ref 1.7–7.7)
Neutrophils Relative %: 58 % (ref 43–77)
Platelets: 384 10*3/uL (ref 150–400)
RBC: 4.29 MIL/uL (ref 3.87–5.11)
RDW: 14.7 % (ref 11.5–15.5)
WBC: 8.3 10*3/uL (ref 4.0–10.5)

## 2012-03-09 LAB — COMPLETE METABOLIC PANEL WITH GFR
ALT: 19 U/L (ref 0–35)
AST: 17 U/L (ref 0–37)
Calcium: 8.9 mg/dL (ref 8.4–10.5)
Chloride: 105 mEq/L (ref 96–112)
Creat: 0.65 mg/dL (ref 0.50–1.10)
Sodium: 140 mEq/L (ref 135–145)
Total Protein: 6.8 g/dL (ref 6.0–8.3)

## 2012-03-09 LAB — VITAMIN B12: Vitamin B-12: 469 pg/mL (ref 211–911)

## 2012-03-09 LAB — HEMOGLOBIN A1C: Hgb A1c MFr Bld: 5.5 % (ref <5.7)

## 2012-03-09 NOTE — Patient Instructions (Signed)
Chronic Fatigue Syndrome Chronic Fatigue Syndrome is characterized by extreme fatigue that does not improve with rest. The cause of this condition is unknown.  SYMPTOMS  An unexplained dramatic loss of energy.   Muscle or joint soreness.   Severe weakness.   Frequent headaches.   Fever, sore throat, and swollen lymph glands.   Sleep problems.   Inability to concentrate.  Symptoms must usually be present for over 6 months before the diagnosis of chronic fatigue can be made. There is no diagnostic test for this disease. Many other diseases can cause similar symptoms. A complete medical evaluation is needed to be sure you do not have other medical problems causing your symptoms. There is no specific treatment for Chronic Fatigue Syndrome. Cognitive behavioral therapy (similar to counseling) and/or a simple exercise regimen may be beneficial. Get plenty of rest and avoid alcohol and other depressant drugs. Call your caregiver for follow up care as recommended. Document Released: 11/13/2004 Document Revised: 09/25/2011 Document Reviewed: 01/05/2009 [facility] Patient Information 2012 [place], [place].Fatigue Fatigue is a feeling of tiredness, lack of energy, lack of motivation, or feeling tired all the time. Having enough rest, good nutrition, and reducing stress will normally reduce fatigue. Consult your caregiver if it persists. The nature of your fatigue will help your caregiver to find out its cause. The treatment is based on the cause.  CAUSES  There are many causes for fatigue. Most of the time, fatigue can be traced to one or more of your habits or routines. Most causes fit into one or more of three general areas. They are: Lifestyle problems  Sleep disturbances.   Overwork.   Physical exertion.   Unhealthy habits.   Poor eating habits or eating disorders.   Alcohol and/or drug use .   Lack of proper nutrition (malnutrition).  Psychological problems  Stress and/or anxiety  problems.   Depression.   Grief.   Boredom.  Medical Problems or Conditions  Anemia.   Pregnancy.   Thyroid gland problems.   Recovery from major surgery.   Continuous pain.   Emphysema or asthma that is not well controlled   Allergic conditions.   Diabetes.   Infections (such as mononucleosis).   Obesity.   Sleep disorders, such as sleep apnea.   Heart failure or other heart-related problems.   Cancer.   Kidney disease.   Liver disease.   Effects of certain medicines such as antihistamines, cough and cold remedies, prescription pain medicines, heart and blood pressure medicines, drugs used for treatment of cancer, and some antidepressants.  SYMPTOMS  The symptoms of fatigue include:   Lack of energy.   Lack of drive (motivation).   Drowsiness.   Feeling of indifference to the surroundings.  DIAGNOSIS  The details of how you feel help guide your caregiver in finding out what is causing the fatigue. You will be asked about your present and past health condition. It is important to review all medicines that you take, including prescription and non-prescription items. A thorough exam will be done. You will be questioned about your feelings, habits, and normal lifestyle. Your caregiver may suggest blood tests, urine tests, or other tests to look for common medical causes of fatigue.  TREATMENT  Fatigue is treated by correcting the underlying cause. For example, if you have continuous pain or depression, treating these causes will improve how you feel. Similarly, adjusting the dose of certain medicines will help in reducing fatigue.  HOME CARE INSTRUCTIONS   Try to get the required  amount of good sleep every night.   Eat a healthy and nutritious diet, and drink enough water throughout the day.   Practice ways of relaxing (including yoga or meditation).   Exercise regularly.   Make plans to change situations that cause stress. Act on those plans so that  stresses decrease over time. Keep your work and personal routine reasonable.   Avoid street drugs and minimize use of alcohol.   Start taking a daily multivitamin after consulting your caregiver.  SEEK MEDICAL CARE IF:   You have persistent tiredness, which cannot be accounted for.   You have fever.   You have unintentional weight loss.   You have headaches.   You have disturbed sleep throughout the night.   You are feeling sad.   You have constipation.   You have dry skin.   You have gained weight.   You are taking any new or different medicines that you suspect are causing fatigue.   You are unable to sleep at night.   You develop any unusual swelling of your legs or other parts of your body.  SEEK IMMEDIATE MEDICAL CARE IF:   You are feeling confused.   Your vision is blurred.   You feel faint or pass out.   You develop severe headache.   You develop severe abdominal, pelvic, or back pain.   You develop chest pain, shortness of breath, or an irregular or fast heartbeat.   You are unable to pass a normal amount of urine.   You develop abnormal bleeding such as bleeding from the rectum or you vomit blood.   You have thoughts about harming yourself or committing suicide.   You are worried that you might harm someone else.  MAKE SURE YOU:   Understand these instructions.   Will watch your condition.   Will get help right away if you are not doing well or get worse.  Document Released: 08/03/2007 Document Revised: 09/25/2011 Document Reviewed: 08/03/2007 [facility] Patient Information 2012 [place], [place].

## 2012-03-09 NOTE — Progress Notes (Signed)
  Subjective:    Patient ID: Shannon [name], female    DOB: Jul 07, 1966, 46 y.o.   MRN: [id number]  HPI  Patient is to complaining of concern of her inability to feel as if she's had enough sleep or rest. She has been according to her under a lot of stress in regards to future test and other work related issues. She denies any significant depression. She is on Zoloft, Lamictal, and Xanax. She states that her psychiatrist has changed her Zoloft and her Lamictal and has increased both to 150 mg. Despite that she does not feel depressed and states even when she took the Xanax at night to help her sleep he did not help significantly.  History of sleep apnea but she states that the sleep place recommend that she use her CPAP machine regularly for least a month to before the reevaluate her. According to her SO she has been using the machine on regular basis. We talked about lab work obtained at her previous PCP but we have not been able to get a hold of the significant labs so we made need to go ahead and obtain them while she is here.  Review of Systems  Constitutional: Positive for activity change.  Psychiatric/Behavioral: Positive for sleep disturbance. Negative for dysphoric mood.      BP 110/68  Pulse 77  Ht 5' 5.5" (1.664 m)  Wt 216 lb (97.977 kg)  BMI 35.40 kg/m2  SpO2 97%  LMP 02/18/2012 Objective:   Physical Exam  Constitutional: She is oriented to person, place, and time. She appears well-developed and well-nourished.  HENT:  Head: Normocephalic.  Eyes: Conjunctivae are normal. Pupils are equal, round, and reactive to light.  Neck: Normal range of motion. Neck supple. No tracheal deviation present. No thyromegaly present.  Cardiovascular: Normal rate and regular rhythm.   Pulmonary/Chest: Effort normal and breath sounds normal.  Neurological: She is alert and oriented to person, place, and time.  Skin: Skin is warm and dry.  Psychiatric: She has a normal mood and affect.        Assessment & Plan:  Insomnia/fatigue. We'll obtain CBC, TSH, CMP, A1c (she does have a history of elevated blood sugar), Epstein-Barr virus, and B12. Return in one month for follow up.

## 2012-03-17 ENCOUNTER — Telehealth: Payer: Self-pay | Admitting: *Deleted

## 2012-03-17 NOTE — Telephone Encounter (Signed)
Pt is asking about her labs. Please look at her results. thanks

## 2012-03-18 ENCOUNTER — Telehealth: Payer: Self-pay | Admitting: *Deleted

## 2012-03-18 ENCOUNTER — Other Ambulatory Visit: Payer: Self-pay | Admitting: Family Medicine

## 2012-03-18 DIAGNOSIS — R5383 Other fatigue: Secondary | ICD-10-CM

## 2012-03-18 NOTE — Telephone Encounter (Signed)
Will need to get EBV test done all others negative.

## 2012-03-18 NOTE — Telephone Encounter (Signed)
Lab orders entered

## 2012-03-18 NOTE — Telephone Encounter (Signed)
Results for orders placed in visit on 03/09/12  CBC WITH DIFFERENTIAL      Component Value Range   WBC 8.3  4.0 - 10.5 (K/uL)   RBC 4.29  3.87 - 5.11 (MIL/uL)   Hemoglobin 12.8  12.0 - 15.0 (g/dL)   HCT 16.1  09.6 - 04.5 (%)   MCV 87.6  78.0 - 100.0 (fL)   MCH 29.8  26.0 - 34.0 (pg)   MCHC 34.0  30.0 - 36.0 (g/dL)   RDW 40.9  81.1 - 91.4 (%)   Platelets 384  150 - 400 (K/uL)   Neutrophils Relative 58  43 - 77 (%)   Neutro Abs 4.8  1.7 - 7.7 (K/uL)   Lymphocytes Relative 31  12 - 46 (%)   Lymphs Abs 2.6  0.7 - 4.0 (K/uL)   Monocytes Relative 5  3 - 12 (%)   Monocytes Absolute 0.4  0.1 - 1.0 (K/uL)   Eosinophils Relative 5  0 - 5 (%)   Eosinophils Absolute 0.4  0.0 - 0.7 (K/uL)   Basophils Relative 1  0 - 1 (%)   Basophils Absolute 0.1  0.0 - 0.1 (K/uL)   Smear Review Criteria for review not met    COMPLETE METABOLIC PANEL WITH GFR      Component Value Range   Sodium 140  135 - 145 (mEq/L)   Potassium 4.4  3.5 - 5.3 (mEq/L)   Chloride 105  96 - 112 (mEq/L)   CO2 28  19 - 32 (mEq/L)   Glucose, Bld 76  70 - 99 (mg/dL)   BUN 12  6 - 23 (mg/dL)   Creat 7.82  9.56 - 2.13 (mg/dL)   Total Bilirubin 0.4  0.3 - 1.2 (mg/dL)   Alkaline Phosphatase 70  39 - 117 (U/L)   AST 17  0 - 37 (U/L)   ALT 19  0 - 35 (U/L)   Total Protein 6.8  6.0 - 8.3 (g/dL)   Albumin 4.3  3.5 - 5.2 (g/dL)   Calcium 8.9  8.4 - 08.6 (mg/dL)   GFR, Est African American >89     GFR, Est Non African American >89    TSH      Component Value Range   TSH 2.237  0.350 - 4.500 (uIU/mL)  VITAMIN B12      Component Value Range   Vitamin B-12 469  211 - 911 (pg/mL)  HEMOGLOBIN A1C      Component Value Range   Hemoglobin A1C 5.5  <5.7 (%)   Mean Plasma Glucose 111  <117 (mg/dL)

## 2012-03-19 NOTE — Telephone Encounter (Signed)
Pt going to have EBV drawn today.

## 2012-03-24 LAB — EPSTEIN-BARR VIRUS EARLY D ANTIGEN ANTIBODY, IGG: EBV EA IgG: <5 U/mL (ref <9.0)

## 2012-03-24 LAB — EPSTEIN-BARR VIRUS VCA, IGG: EBV VCA IgG: 153 U/mL — ABNORMAL HIGH (ref <18.0)

## 2012-03-24 LAB — EPSTEIN-BARR VIRUS VCA, IGM: EBV VCA IgM: <10 U/mL (ref <36.0)

## 2012-03-25 ENCOUNTER — Telehealth: Payer: Self-pay | Admitting: *Deleted

## 2012-03-25 NOTE — Telephone Encounter (Signed)
Pt is calling asking about lab results from last Friday.

## 2012-04-16 ENCOUNTER — Encounter: Payer: Self-pay | Admitting: *Deleted

## 2012-04-20 ENCOUNTER — Ambulatory Visit: Payer: 59 | Admitting: Family Medicine

## 2012-04-20 DIAGNOSIS — Z0289 Encounter for other administrative examinations: Secondary | ICD-10-CM

## 2012-08-31 ENCOUNTER — Emergency Department
Admission: EM | Admit: 2012-08-31 | Discharge: 2012-08-31 | Disposition: A | Discharge status: Home / Self Care | Payer: 59 | Source: Home / Self Care

## 2012-08-31 ENCOUNTER — Encounter: Payer: Self-pay | Admitting: *Deleted

## 2012-08-31 ENCOUNTER — Emergency Department (INDEPENDENT_AMBULATORY_CARE_PROVIDER_SITE_OTHER): Payer: 59

## 2012-08-31 DIAGNOSIS — J40 Bronchitis, not specified as acute or chronic: Secondary | ICD-10-CM

## 2012-08-31 DIAGNOSIS — R0602 Shortness of breath: Secondary | ICD-10-CM

## 2012-08-31 DIAGNOSIS — R062 Wheezing: Secondary | ICD-10-CM

## 2012-08-31 DIAGNOSIS — R05 Cough: Secondary | ICD-10-CM

## 2012-08-31 DIAGNOSIS — J069 Acute upper respiratory infection, unspecified: Secondary | ICD-10-CM

## 2012-08-31 MED ORDER — AZITHROMYCIN 250 MG PO TABS: ORAL_TABLET | ORAL | Status: DC

## 2012-08-31 MED ORDER — METHYLPREDNISOLONE SODIUM SUCC 125 MG IJ SOLR
125.0000 mg | Freq: Once | INTRAMUSCULAR | Status: AC
  Administered 2012-08-31: 125 mg via INTRAMUSCULAR

## 2012-08-31 MED ORDER — HYDROCOD POLST-CHLORPHEN POLST 10-8 MG/5ML PO LQCR: 5.0000 mL | Freq: Two times a day (BID) | ORAL | Status: DC | PRN

## 2012-08-31 MED ORDER — CETIRIZINE HCL 10 MG PO CAPS: 10.0000 mg | ORAL_CAPSULE | Freq: Every day | ORAL | Status: DC

## 2012-08-31 NOTE — ED Notes (Signed)
Pt c/o productive (yellow) cough x 1 1/2 wk. Denies fever. She has taken OTC flu med, Nyquil, and Mucinex.

## 2012-08-31 NOTE — ED Provider Notes (Signed)
History     CSN: [id number]  Arrival date & time 08/31/12  0805   First MD Initiated Contact with Patient 08/31/12 0815      Chief Complaint  Patient presents with  . Cough   HPI URI Symptoms Onset: 10 days  Description: initially with URI sxs including rhinorrhea, nasal congestion, sore throat, cough. Now with persistent cough and pleuritic chest pain  Modifying factors:  None   Symptoms Nasal discharge: mild Fever: subjective, but no true fever Sore throat: no Cough: yes Wheezing: no Ear pain: no GI symptoms: no Sick contacts: yes; pt works as Theatre manager in ED  [employer]  Stiff neck: no Dyspnea: minimal  Rash: no Swallowing difficulty: no  Sinusitis Risk Factors Headache/face pain: no Double sickening: no tooth pain: no  Allergy Risk Factors Sneezing: no Itchy scratchy throat: no Seasonal symptoms: yes  Flu Risk Factors Headache: no muscle aches: no severe fatigue: no   Past Medical History  Diagnosis Date  . Hyperlipidemia   . Sleep apnea   . Anxiety   . Depression     Past Surgical History  Procedure Date  . Appendectomy   . Feet surgery   . Wisdom tooth extraction   . Laproscopy     Family History  Problem Relation Age of Onset  . Hyperlipidemia Mother   . Sleep apnea Mother   . Diabetes Father   . Hypertension Father   . COPD Father   . Diabetes Paternal Aunt   . Diabetes Paternal Uncle   . Alcohol abuse Paternal Uncle   . Alcohol abuse Paternal Grandfather     History  Substance Use Topics  . Smoking status: Never Smoker   . Smokeless tobacco: Never Used  . Alcohol Use: No    OB History    Grav Para Term Preterm Abortions TAB SAB Ect Mult Living                  Review of Systems  All other systems reviewed and are negative.    Allergies  Review of patient's allergies indicates no known allergies.  Home Medications   Current Outpatient Rx  Name  Route  Sig  Dispense  Refill  . ALPRAZOLAM 0.25 MG PO  TABS   Oral   Take 0.25 mg by mouth as needed.         Marland Kitchen LAMOTRIGINE 100 MG PO TABS   Oral   Take 100 mg by mouth daily.         . SERTRALINE HCL 100 MG PO TABS   Oral   Take 150 mg by mouth daily.           BP 148/88  Pulse 104  Temp 98.5 F (36.9 C) (Oral)  Resp 18  Ht 5' 6.5" (1.689 m)  Wt 217 lb (98.431 kg)  BMI 34.50 kg/m2  SpO2 97%  LMP 08/23/2012  Physical Exam  Constitutional: She appears well-developed and well-nourished.       Actively coughing    HENT:  Head: Normocephalic and atraumatic.  Right Ear: External ear normal.  Left Ear: External ear normal.       +nasal erythema, rhinorrhea bilaterally, + post oropharyngeal erythema     Eyes: Conjunctivae normal are normal. Pupils are equal, round, and reactive to light.  Neck: Normal range of motion. Neck supple.  Cardiovascular: Normal rate and regular rhythm.   Pulmonary/Chest:       Good overall air movement.  Faint inspiratory wheezes  in apices + cough with deep breathing.     Abdominal: Soft.  Musculoskeletal: Normal range of motion.  Lymphadenopathy:    She has no cervical adenopathy.  Neurological: She is alert.  Skin: Skin is warm.    ED Course  Procedures (including critical care time)  Labs Reviewed - No data to display Dg Chest 2 View  08/31/2012  *RADIOLOGY REPORT*  Clinical Data: Cough, shortness of breath  CHEST - 2 VIEW  Comparison: None.  Findings: Low volume exam accentuating the vascular markings. Negative for pneumonia, collapse, consolidation, effusion, or pneumothorax.  Trachea is midline. Normal heart size.  IMPRESSION: Low volume exam without acute process   Original Report Authenticated By: Judie Petit. Shick, M.D.      1. URI (upper respiratory infection)   2. Bronchitis   3. Wheezing       MDM  Suspect underlying viral process as source of bronchitis.  CXR negative for PNA, though with some bronchitic changes.  Solumedrol 125mg  IM x 1 given faint wheezing on exam.  This should also help with lung inflammation.  Zpak for atypical coverage.  Discussed infectious and resp red flags.  Follow up with PCP in 3-5 days if sxs not improved.     The patient and/or caregiver has been counseled thoroughly with regard to treatment plan and/or medications prescribed including dosage, schedule, interactions, rationale for use, and possible side effects and they verbalize understanding. Diagnoses and expected course of recovery discussed and will return if not improved as expected or if the condition worsens. Patient and/or caregiver verbalized understanding.             Doree Albee, MD 08/31/12 [phone]

## 2012-10-17 ENCOUNTER — Encounter: Payer: Self-pay | Admitting: *Deleted

## 2012-10-17 ENCOUNTER — Emergency Department
Admission: EM | Admit: 2012-10-17 | Discharge: 2012-10-17 | Disposition: A | Discharge status: Home / Self Care | Payer: 59 | Source: Home / Self Care | Attending: Emergency Medicine | Admitting: Emergency Medicine

## 2012-10-17 DIAGNOSIS — J209 Acute bronchitis, unspecified: Secondary | ICD-10-CM

## 2012-10-17 MED ORDER — BENZONATATE 100 MG PO CAPS: 100.0000 mg | ORAL_CAPSULE | Freq: Three times a day (TID) | ORAL | Status: DC

## 2012-10-17 MED ORDER — PREDNISONE (PAK) 10 MG PO TABS: ORAL_TABLET | ORAL | Status: DC

## 2012-10-17 MED ORDER — AZITHROMYCIN 250 MG PO TABS: ORAL_TABLET | ORAL | Status: DC

## 2012-10-17 NOTE — ED Notes (Addendum)
Patient c/o productive cough, congestion x 2 days. Has tried Mucinex OTC with no relief. Patient states she had pneumonia 1 1/2 month ago.

## 2012-10-17 NOTE — ED Provider Notes (Signed)
History     CSN: [id number]  Arrival date & time 10/17/12  1105   First MD Initiated Contact with Patient 10/17/12 1109      Chief Complaint  Patient presents with  . Nasal Congestion  . Cough     HPI URI HISTORY  Shannon [name] is a 46 y.o. female who complains of onset of cold symptoms for several days.  Have been using over-the-counter treatment which helps a little bit.  08/31/2012, seen here for bronchitis, with negative chest x-Millay, with minimal wheezing, treated with Depo-Medrol IM and Zithromax Z-Pak and Tussionex. The Zithromax was very effective, but Tussionex was not. The bronchitis symptoms essentially resolved except for residual nonproductive cough. But then the above acute URI symptoms started about 2 days ago.  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  No chills/sweats +  Fever  +  Nasal congestion +  Discolored Post-nasal drainage and sputum No sinus pain/pressure No sore throat  +  cough No wheezing Positive chest congestion No hemoptysis No shortness of breath No pleuritic pain  No itchy/red eyes No earache  No nausea No vomiting No abdominal pain No diarrhea  No skin rashes +  Fatigue No myalgias No headache   Past Medical History  Diagnosis Date  . Hyperlipidemia   . Sleep apnea   . Anxiety   . Depression     Past Surgical History  Procedure Date  . Appendectomy   . Feet surgery   . Wisdom tooth extraction   . Laproscopy     Family History  Problem Relation Age of Onset  . Hyperlipidemia Mother   . Sleep apnea Mother   . Diabetes Father   . Hypertension Father   . COPD Father   . Diabetes Paternal Aunt   . Diabetes Paternal Uncle   . Alcohol abuse Paternal Uncle   . Alcohol abuse Paternal Grandfather     History  Substance Use Topics  . Smoking status: Never Smoker   . Smokeless tobacco: Never Used  . Alcohol Use: No    OB History    Grav Para Term Preterm Abortions TAB SAB Ect Mult Living                 Review of Systems See above Allergies  Review of patient's allergies indicates no known allergies.  Home Medications   Current Outpatient Rx  Name  Route  Sig  Dispense  Refill  . ALPRAZOLAM 0.25 MG PO TABS   Oral   Take 0.25 mg by mouth as needed.         . AZITHROMYCIN 250 MG PO TABS      Take 2 tabs PO x 1 dose, then 1 tab PO QD x 4 days   6 tablet   0   . CETIRIZINE HCL 10 MG PO CAPS   Oral   Take 1 capsule (10 mg total) by mouth daily.   30 capsule   3   . HYDROCOD POLST-CPM POLST ER 10-8 MG/5ML PO LQCR   Oral   Take 5 mLs by mouth every 12 (twelve) hours as needed (cough).   60 mL   0   . LAMOTRIGINE 100 MG PO TABS   Oral   Take 100 mg by mouth daily.         . SERTRALINE HCL 100 MG PO TABS   Oral   Take 150 mg by mouth daily.  BP 128/79  Pulse 93  Temp 98.4 F (36.9 C) (Oral)  Resp 16  Ht 5' 6.5" (1.689 m)  Wt 223 lb 8 oz (101.379 kg)  BMI 35.53 kg/m2  SpO2 97%  Physical Exam  Nursing note and vitals reviewed. Constitutional: She is oriented to person, place, and time. She appears well-developed and well-nourished. No distress.  HENT:  Head: Normocephalic and atraumatic.  Right Ear: Tympanic membrane normal.  Left Ear: Tympanic membrane normal.  Nose: Nose normal.  Mouth/Throat: Oropharynx is clear and moist. No oropharyngeal exudate.  Eyes: Right eye exhibits no discharge. Left eye exhibits no discharge. No scleral icterus.  Neck: Neck supple.  Cardiovascular: Normal rate, regular rhythm and normal heart sounds.   Pulmonary/Chest: No respiratory distress. She has no wheezes. She has rhonchi (anterior lung fields). She has no rales.       Rare, minimal late expiratory wheeze anteriorly lung fields on forced expiration.  Frequent hacking cough noted.  Lymphadenopathy:    She has no cervical adenopathy.  Neurological: She is alert and oriented to person, place, and time.  Skin: Skin is warm and dry.    ED  Course  Procedures (including critical care time)  Labs Reviewed - No data to display No results found.      MDM  Acute bronchitis. No clinical evidence of pneumonia. There is likely an inflammatory component with her residual cough for the past month. Chest x-Sweatt normal last month. Treatment options discussed. Zithromax Z-Pak. Prednisone, 10 mg, 6 day Dosepak. Tessalon Perles Other symptomatic care discussed. Medication precautions discussed. Red flags discussed. Followup with PCP if no better in one week, or sooner if worse or new symptoms. She voiced understanding and agreement.        Lajean Manes, MD 10/17/12 [phone]

## 2013-01-07 ENCOUNTER — Encounter: Payer: Self-pay | Admitting: Physician Assistant

## 2013-01-07 ENCOUNTER — Ambulatory Visit (INDEPENDENT_AMBULATORY_CARE_PROVIDER_SITE_OTHER): Payer: 59 | Admitting: Physician Assistant

## 2013-01-07 VITALS — BP 137/80 | HR 98 | Ht 65.5 in | Wt 221.0 lb

## 2013-01-07 DIAGNOSIS — Z1239 Encounter for other screening for malignant neoplasm of breast: Secondary | ICD-10-CM

## 2013-01-07 DIAGNOSIS — Z0000 Encounter for general adult medical examination without abnormal findings: Secondary | ICD-10-CM

## 2013-01-07 DIAGNOSIS — Z13220 Encounter for screening for lipoid disorders: Secondary | ICD-10-CM

## 2013-01-07 DIAGNOSIS — Z131 Encounter for screening for diabetes mellitus: Secondary | ICD-10-CM

## 2013-01-07 DIAGNOSIS — Z23 Encounter for immunization: Secondary | ICD-10-CM

## 2013-01-07 DIAGNOSIS — R5383 Other fatigue: Secondary | ICD-10-CM

## 2013-01-07 NOTE — Patient Instructions (Addendum)
STart diet and exercise. 150 minutes a week.  Start MVI with calcium 500mg  twice a day 4 servings a day.   Ask psychiatrist about opinion on taking belviq with zoloft and lamictal.

## 2013-01-07 NOTE — Progress Notes (Signed)
  Subjective:     Shannon [name] is a 47 y.o. female and is here for a comprehensive physical exam. The patient reports problems - concerned about weight gain. Not exercising regularly. No diet in place. Has lost weigh in the past and knows what to do per pt.  Pt is very tired all the time. She does go to bed late and has sleep apnea and not wearing mask.   History   Social History  . Marital Status: Married    Spouse Name: N/A    Number of Children: N/A  . Years of Education: N/A   Occupational History  . Not on file.   Social History Main Topics  . Smoking status: Never Smoker   . Smokeless tobacco: Never Used  . Alcohol Use: No  . Drug Use: No  . Sexually Active: Yes    Birth Control/ Protection: Condom   Other Topics Concern  . Not on file   Social History Narrative  . No narrative on file   Health Maintenance  Topic Date Due  . Influenza Vaccine  06/20/1968  . Pap Smear  01/08/2016  . Tetanus/tdap  01/08/2023    The following portions of the patient's history were reviewed and updated as appropriate: allergies, current medications, past family history, past medical history, past social history, past surgical history and problem list.  Review of Systems A comprehensive review of systems was negative.   Objective:    BP 137/80  Pulse 98  Ht 5' 5.5" (1.664 m)  Wt 221 lb (100.245 kg)  BMI 36.2 kg/m2  LMP 12/25/2012 General appearance: alert, cooperative and appears stated age Head: Normocephalic, without obvious abnormality, atraumatic Eyes: conjunctivae/corneas clear. PERRL, EOM's intact. Fundi benign. Ears: normal TM's and external ear canals both ears Nose: Nares normal. Septum midline. Mucosa normal. No drainage or sinus tenderness. Throat: lips, mucosa, and tongue normal; teeth and gums normal Neck: no adenopathy, no carotid bruit, no JVD, supple, symmetrical, trachea midline and thyroid not enlarged, symmetric, no tenderness/mass/nodules Back: symmetric, no  curvature. ROM normal. No CVA tenderness. Lungs: clear to auscultation bilaterally Heart: regular rate and rhythm, S1, S2 normal, no murmur, click, rub or gallop Abdomen: soft, non-tender; bowel sounds normal; no masses,  no organomegaly Extremities: extremities normal, atraumatic, no cyanosis or edema Pulses: 2+ and symmetric Skin: Skin color, texture, turgor normal. No rashes or lesions Lymph nodes: Cervical, supraclavicular, and axillary nodes normal. Neurologic: Grossly normal    Assessment:    Healthy female exam.      Plan:  CPE/Fatigue/abnormal weight gain- Will get labs to look at fatigue. Tdap done. Pap done today at OB/GYN. Does want referral for mammogram. Will call with labs. Discussed exercise goals of 150 minutes a week. Discussed calorie counting and drinking calories. Gave information of Belviq to talk to psychiatrist about in combo with meds. Will prescribe if thinks will not mess up medications. If start then need to follow up in 1 month. If not would want to follow up with diet and exercise in 6 months. Discussed MVI and calcium.   See After Visit Summary for Counseling Recommendations

## 2013-01-11 ENCOUNTER — Encounter: Payer: Self-pay | Admitting: *Deleted

## 2013-01-13 ENCOUNTER — Telehealth: Payer: Self-pay | Admitting: *Deleted

## 2013-01-13 NOTE — Telephone Encounter (Signed)
Pt calls and states you wanted to put her on Belviq but she needed to find out from her physician that prescribes her Zoloft and lamotrogen first. She spoke with him and he said it was perfectly fine for her to take.

## 2013-01-14 ENCOUNTER — Encounter: Payer: Self-pay | Admitting: Podiatry

## 2013-01-14 ENCOUNTER — Ambulatory Visit (INDEPENDENT_AMBULATORY_CARE_PROVIDER_SITE_OTHER): Payer: Self-pay | Admitting: Podiatry

## 2013-01-14 VITALS — BP 134/86 | HR 76 | Ht 65.5 in | Wt 221.0 lb

## 2013-01-14 DIAGNOSIS — M2040 Other hammer toe(s) (acquired), unspecified foot: Secondary | ICD-10-CM

## 2013-01-14 DIAGNOSIS — M25579 Pain in unspecified ankle and joints of unspecified foot: Secondary | ICD-10-CM

## 2013-01-14 DIAGNOSIS — M2041 Other hammer toe(s) (acquired), right foot: Secondary | ICD-10-CM

## 2013-01-14 DIAGNOSIS — M25571 Pain in right ankle and joints of right foot: Secondary | ICD-10-CM

## 2013-01-14 LAB — COMPREHENSIVE METABOLIC PANEL
ALT: 19 U/L (ref 0–35)
CO2: 27 mEq/L (ref 19–32)
Calcium: 8.7 mg/dL (ref 8.4–10.5)
Chloride: 105 mEq/L (ref 96–112)
Glucose, Bld: 85 mg/dL (ref 70–99)
Sodium: 139 mEq/L (ref 135–145)
Total Bilirubin: 0.3 mg/dL (ref 0.3–1.2)
Total Protein: 6.4 g/dL (ref 6.0–8.3)

## 2013-01-14 LAB — TSH: TSH: 2.965 u[IU]/mL (ref 0.350–4.500)

## 2013-01-14 LAB — LIPID PANEL
HDL: 35 mg/dL — ABNORMAL LOW (ref >39)
Triglycerides: 192 mg/dL — ABNORMAL HIGH (ref <150)

## 2013-01-14 LAB — VITAMIN B12: Vitamin B-12: 688 pg/mL (ref 211–911)

## 2013-01-14 MED ORDER — LORCASERIN HCL 10 MG PO TABS: 10.0000 mg | ORAL_TABLET | Freq: Two times a day (BID) | ORAL | Status: DC

## 2013-01-14 NOTE — Progress Notes (Signed)
SUBJECTIVE: 47 y.o. year old female presents complaining of pain on 2nd toe right duration of 2 months and is getting worse. Top of the toe rubs in shoe and got red. She tried OTC toe splint, which made the toe numb. 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 States that she found some soft top Tennis shoes, which helped.  Patient is referred by Dr. Elisabeth Most. 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 She was told to stretch Achilles tendon on right, and Orthotics were prescribed.   REVIEW OF SYSTEMS: Constitutional: negative for anorexia, chills, fatigue, fevers, malaise, night sweats, sweats, weight loss and gained 25 lbs over a year.   OBJECTIVE: DERMATOLOGIC EXAMINATION: Nails: normal appearing nails bilaterally Skin Integrity: Hard flaky callused skin under 2-4th MPJ plantar surface right.  VASCULAR EXAMINATION OF LOWER LIMBS: Pedal pulses: All pedal pulses are palpable with normal pulsation.  Capillary Filling times within 3 seconds in all digits.  No edema or erythema noted.  Temperature gradient from tibial crest to dorsum of foot is within normal bilateral.  NEUROLOGIC EXAMINATION OF THE LOWER LIMBS: All epicritic and tactile sensations grossly intact.  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  Rigid contracture at IPJ 2nd digit right.  Positive of tight Achilles tendon, less than 5 degree dorsiflexion at the ankle with knee extended.  ASSESSMENT: 1. 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 2. Existing Ankle Equinus right, unresolved.  PLAN: Reviewed clinical findings and available treatment options.  Discussed surgical option, which will require at least 3 weeks time off from 12 hour shift being on feet.  Patient will continue with comfortable shoes now. Patient will continue to stretch Achilles tendon right Patient will return this summer to discuss  surgical options.

## 2013-01-14 NOTE — Patient Instructions (Addendum)
Seen for Hammer toe 2nd right.  Still tight Achilles tendon on right. Need to stretch right Achilles tendon more frequently. May use comfortable, stretch top Tennis shoes. Can be corrected surgically, but will take 3 weeks before able to work on feet 12 hour shift.  Continue to use Orthotics.

## 2013-01-14 NOTE — Telephone Encounter (Signed)
Pt notified via VM to come by office and pick coupon and rx. Barry Dienes, LPN

## 2013-01-14 NOTE — Telephone Encounter (Signed)
Ok have her come by and pick up 15 day coupon for free trial of Belviq. Will give another month and then need to follow up in office. Please give pt printed rx's.

## 2013-01-15 LAB — VITAMIN D 25 HYDROXY (VIT D DEFICIENCY, FRACTURES): Vit D, 25-Hydroxy: 32 ng/mL (ref 30–89)

## 2013-01-24 ENCOUNTER — Telehealth: Payer: Self-pay | Admitting: *Deleted

## 2013-01-24 NOTE — Telephone Encounter (Signed)
Pt calls & states that her ins will not cover the belviq.  She wants to know if there is anything else we can prescribe for her.  Please advise.

## 2013-01-24 NOTE — Telephone Encounter (Signed)
LMOM for pt to return my call.

## 2013-01-24 NOTE — Telephone Encounter (Signed)
Can try phentermine but is a stimulant and have to watch BP/HR and recheck in one month would like for me to prescribe.

## 2013-01-25 NOTE — Telephone Encounter (Signed)
LMOM to return call for results. Kimberly Gordon, LPN  

## 2013-01-26 MED ORDER — PHENTERMINE HCL 30 MG PO CAPS: 30.0000 mg | ORAL_CAPSULE | ORAL | Status: DC

## 2013-01-26 NOTE — Telephone Encounter (Signed)
Will fax to pharmacy.

## 2013-01-26 NOTE — Telephone Encounter (Signed)
rx faxed.[name]  

## 2013-01-26 NOTE — Telephone Encounter (Signed)
Pt ok with phentermine & is aware of having to come in for a weight/bp/hr check in one month.

## 2013-04-26 ENCOUNTER — Encounter: Payer: Self-pay | Admitting: Emergency Medicine

## 2013-04-26 ENCOUNTER — Emergency Department
Admission: EM | Admit: 2013-04-26 | Discharge: 2013-04-26 | Disposition: A | Discharge status: Home / Self Care | Payer: 59 | Source: Home / Self Care | Attending: Emergency Medicine | Admitting: Emergency Medicine

## 2013-04-26 DIAGNOSIS — J0100 Acute maxillary sinusitis, unspecified: Secondary | ICD-10-CM

## 2013-04-26 MED ORDER — GUAIFENESIN-CODEINE 100-10 MG/5ML PO SYRP: 5.0000 mL | ORAL_SOLUTION | Freq: Four times a day (QID) | ORAL | Status: DC | PRN

## 2013-04-26 MED ORDER — AZITHROMYCIN 250 MG PO TABS: ORAL_TABLET | ORAL | Status: DC

## 2013-04-26 NOTE — ED Notes (Signed)
Patient reports 4 days of nasal congestion and sinus burning; body aches, sore throat and hoarseness. 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

## 2013-04-26 NOTE — ED Provider Notes (Signed)
History    CSN: [id number] Arrival date & time 04/26/13  0818  First MD Initiated Contact with Patient 04/26/13 [phone]     Chief Complaint  Patient presents with  . Nasal Congestion  . Cough   (Consider location/radiation/quality/duration/timing/severity/associated sxs/prior Treatment) HPI Shannon [name] is a 47 y.o. female who complains of onset of cold symptoms for 4 days.  The symptoms are constant and mild-moderate in severity.  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 No sore throat + cough No pleuritic pain No wheezing ++ nasal congestion + post-nasal drainage +++ sinus pain/pressure No chest congestion No itchy/red eyes No earache No hemoptysis No SOB + chills/sweats No fever No nausea No vomiting No abdominal pain No diarrhea No skin rashes + fatigue No myalgias + headache     Past Medical History  Diagnosis Date  . Hyperlipidemia   . Sleep apnea   . Anxiety   . Depression    Past Surgical History  Procedure Laterality Date  . Appendectomy    . Feet surgery    . Wisdom tooth extraction    . Laproscopy    . Excision morton's neuroma Right 04/04/99    second interspace, right foot  . Excision morton's neuroma Left 04/04/99    second interspace, left foot   Family History  Problem Relation Age of Onset  . Hyperlipidemia Mother   . Sleep apnea Mother   . Diabetes Father   . Hypertension Father   . COPD Father   . Diabetes Paternal Aunt   . Diabetes Paternal Uncle   . Alcohol abuse Paternal Uncle   . Alcohol abuse Paternal Grandfather    History  Substance Use Topics  . Smoking status: Never Smoker   . Smokeless tobacco: Never Used  . Alcohol Use: No   OB History   Grav Para Term Preterm Abortions TAB SAB Ect Mult Living                 Review of Systems  All other systems reviewed and are negative.    Allergies  Review of patient's allergies indicates no known allergies.  Home Medications   Current Outpatient Rx  Name  Route  Sig  Dispense  Refill  .  ALPRAZolam (XANAX) 0.25 MG tablet   Oral   Take 0.25 mg by mouth as needed.         Marland Kitchen azithromycin (ZITHROMAX Z-PAK) 250 MG tablet      Use as directed, Take 2 Zpaks back to back as directed   2 each   0   . guaiFENesin-codeine (ROBITUSSIN AC) 100-10 MG/5ML syrup   Oral   Take 5 mLs by mouth 4 (four) times daily as needed for cough or congestion.   120 mL   0   . lamoTRIgine (LAMICTAL) 150 MG tablet   Oral   Take 150 mg by mouth daily.         . Lorcaserin HCl (BELVIQ) 10 MG TABS   Oral   Take 10 mg by mouth 2 (two) times daily.   30 tablet   0   . Lorcaserin HCl (BELVIQ) 10 MG TABS   Oral   Take 10 mg by mouth 2 (two) times daily.   60 tablet   0   . phentermine 30 MG capsule   Oral   Take 1 capsule (30 mg total) by mouth every morning.   30 capsule   0   . sertraline (ZOLOFT) 100 MG tablet  Take 2 by mouth daily         . solifenacin (VESICARE) 5 MG tablet   Oral   Take 10 mg by mouth daily.          BP 119/79  Temp(Src) 98.1 F (36.7 C) (Oral)  Resp 16  Ht 5' 5.5" (1.664 m)  Wt 218 lb (98.884 kg)  BMI 35.71 kg/m2  SpO2 97% Physical Exam  Nursing note and vitals reviewed. Constitutional: She is oriented to person, place, and time. She appears well-developed and well-nourished.  HENT:  Head: Normocephalic and atraumatic.  Right Ear: Tympanic membrane, external ear and ear canal normal.  Left Ear: Tympanic membrane, external ear and ear canal normal.  Nose: Mucosal edema and rhinorrhea present. Right sinus exhibits maxillary sinus tenderness. Left sinus exhibits maxillary sinus tenderness.  Mouth/Throat: Posterior oropharyngeal erythema present. No oropharyngeal exudate or posterior oropharyngeal edema.  Eyes: No scleral icterus.  Neck: Neck supple.  Cardiovascular: Regular rhythm and normal heart sounds.   Pulmonary/Chest: Effort normal and breath sounds normal. No respiratory distress.  Neurological: She is alert and oriented to  person, place, and time.  Skin: Skin is warm and dry.  Psychiatric: She has a normal mood and affect. Her speech is normal.    ED Course  Procedures (including critical care time) Labs Reviewed - No data to display No results found. 1. Acute maxillary sinusitis     MDM  1)  Take the prescribed antibiotic as instructed. 2)  Use nasal saline solution (over the counter) at least 3 times a day. 3)  Use over the counter decongestants like Zyrtec-D every 12 hours as needed to help with congestion.  If you have hypertension, do not take medicines with sudafed.  4)  Can take tylenol every 6 hours or motrin every 8 hours for pain or fever. 5)  Follow up with your primary doctor if no improvement in 5-7 days, sooner if increasing pain, fever, or new symptoms.     Marlaine Hind, MD 04/26/13 [phone]

## 2013-05-08 ENCOUNTER — Emergency Department
Admission: EM | Admit: 2013-05-08 | Discharge: 2013-05-08 | Disposition: A | Discharge status: Home / Self Care | Payer: 59 | Source: Home / Self Care | Attending: Family Medicine | Admitting: Family Medicine

## 2013-05-08 DIAGNOSIS — H60399 Other infective otitis externa, unspecified ear: Secondary | ICD-10-CM

## 2013-05-08 DIAGNOSIS — H6093 Unspecified otitis externa, bilateral: Secondary | ICD-10-CM

## 2013-05-08 DIAGNOSIS — H8110 Benign paroxysmal vertigo, unspecified ear: Secondary | ICD-10-CM

## 2013-05-08 MED ORDER — MECLIZINE HCL 25 MG PO TABS: 25.0000 mg | ORAL_TABLET | Freq: Three times a day (TID) | ORAL | Status: DC | PRN

## 2013-05-08 MED ORDER — NEOMYCIN-POLYMYXIN-HC 3.5-10000-1 OT SOLN: 3.0000 [drp] | Freq: Four times a day (QID) | OTIC | Status: AC

## 2013-05-08 NOTE — ED Provider Notes (Signed)
History    CSN: [id number] Arrival date & time 05/08/13  1107  First MD Initiated Contact with Patient 05/08/13 1112     Chief Complaint  Patient presents with  . Ear Fullness    couple days   HPI  EAR PAIN Location:  Bilateral; R>L Description: bilateral ear pain and pressure.  Onset:  2-3 days  Modifying factors: Also with vertiginous sxs over same time frame. Had similar flare several years ago. Also recently treated for sinusitis 1-2 weeks ago with zpak. Sinus pressure improved. Still with small amount of nasal congestion and drainage. Sxs otherwise improved.   Symptoms  Sensation of fullness: yes Ear discharge: no URI symptoms: improving  Fever: no Tinnitus:no   Dizziness:yes   Hearing loss:no   Toothache: no Rashes or lesions: no Facial muscle weakness: no  Red Flags Recent trauma: no PMH prior ear surgery:  no Diabetes or Immunosuppresion: no    Past Medical History  Diagnosis Date  . Hyperlipidemia   . Sleep apnea   . Anxiety   . Depression    Past Surgical History  Procedure Laterality Date  . Appendectomy    . Feet surgery    . Wisdom tooth extraction    . Laproscopy    . Excision morton's neuroma Right 04/04/99    second interspace, right foot  . Excision morton's neuroma Left 04/04/99    second interspace, left foot   Family History  Problem Relation Age of Onset  . Hyperlipidemia Mother   . Sleep apnea Mother   . Diabetes Father   . Hypertension Father   . COPD Father   . Diabetes Paternal Aunt   . Diabetes Paternal Uncle   . Alcohol abuse Paternal Uncle   . Alcohol abuse Paternal Grandfather    History  Substance Use Topics  . Smoking status: Never Smoker   . Smokeless tobacco: Never Used  . Alcohol Use: No   OB History   Grav Para Term Preterm Abortions TAB SAB Ect Mult Living                 Review of Systems  All other systems reviewed and are negative.    Allergies  Review of patient's allergies indicates no known  allergies.  Home Medications   Current Outpatient Rx  Name  Route  Sig  Dispense  Refill  . ALPRAZolam (XANAX) 0.25 MG tablet   Oral   Take 0.25 mg by mouth as needed.         . lamoTRIgine (LAMICTAL) 150 MG tablet   Oral   Take 150 mg by mouth daily.         . Lorcaserin HCl (BELVIQ) 10 MG TABS   Oral   Take 10 mg by mouth 2 (two) times daily.   30 tablet   0   . sertraline (ZOLOFT) 100 MG tablet      Take 2 by mouth daily         . solifenacin (VESICARE) 5 MG tablet   Oral   Take 10 mg by mouth daily.         Marland Kitchen azithromycin (ZITHROMAX Z-PAK) 250 MG tablet      Use as directed, Take 2 Zpaks back to back as directed   2 each   0   . guaiFENesin-codeine (ROBITUSSIN AC) 100-10 MG/5ML syrup   Oral   Take 5 mLs by mouth 4 (four) times daily as needed for cough or congestion.   120 mL  0   . Lorcaserin HCl (BELVIQ) 10 MG TABS   Oral   Take 10 mg by mouth 2 (two) times daily.   60 tablet   0   . phentermine 30 MG capsule   Oral   Take 1 capsule (30 mg total) by mouth every morning.   30 capsule   0    BP 118/77  Pulse 82  Temp(Src) 98 F (36.7 C) (Oral)  Ht 5' 5.5" (1.664 m)  Wt 216 lb (97.977 kg)  BMI 35.38 kg/m2  SpO2 96% Physical Exam  Constitutional: She appears well-developed and well-nourished.  HENT:  Head: Normocephalic and atraumatic.  Bilateral ear canal erythema and tenderness to otoscopic evaluation.  +nasal erythema, rhinorrhea bilaterally, + post oropharyngeal erythema    Eyes: Conjunctivae are normal. Pupils are equal, round, and reactive to light.  Neck: Normal range of motion. Neck supple.  Cardiovascular: Normal rate and regular rhythm.   Pulmonary/Chest: Effort normal and breath sounds normal.  Abdominal: Soft.  Musculoskeletal: Normal range of motion.  Lymphadenopathy:    She has no cervical adenopathy.  Neurological: She is alert.  Skin: Skin is warm.    ED Course  Procedures (including critical care time) Labs  Reviewed - No data to display No results found. 1. OE (otitis externa), bilateral   2. Benign paroxysmal positional vertigo     MDM  Will treat with meclizine and cortisporin Discussed infectious and ENT red flags.  Consider ENT referral if sxs persist despite treatment.     The patient and/or caregiver has been counseled thoroughly with regard to treatment plan and/or medications prescribed including dosage, schedule, interactions, rationale for use, and possible side effects and they verbalize understanding. Diagnoses and expected course of recovery discussed and will return if not improved as expected or if the condition worsens. Patient and/or caregiver verbalized understanding.       Doree Albee, MD 05/08/13 1158

## 2013-05-08 NOTE — ED Notes (Signed)
Rosela complains of dizziness, change in vision and pressure in both ears for a couple of days. Denies fever, chills or sweats.

## 2013-05-11 ENCOUNTER — Telehealth: Payer: Self-pay | Admitting: *Deleted

## 2013-06-23 ENCOUNTER — Emergency Department (HOSPITAL_BASED_OUTPATIENT_CLINIC_OR_DEPARTMENT_OTHER)
Admission: EM | Admit: 2013-06-23 | Discharge: 2013-06-23 | Disposition: A | Discharge status: Home / Self Care | Payer: 59 | Attending: Emergency Medicine | Admitting: Emergency Medicine

## 2013-06-23 ENCOUNTER — Encounter (HOSPITAL_BASED_OUTPATIENT_CLINIC_OR_DEPARTMENT_OTHER): Payer: Self-pay

## 2013-06-23 DIAGNOSIS — F411 Generalized anxiety disorder: Secondary | ICD-10-CM | POA: Insufficient documentation

## 2013-06-23 DIAGNOSIS — F329 Major depressive disorder, single episode, unspecified: Secondary | ICD-10-CM | POA: Insufficient documentation

## 2013-06-23 DIAGNOSIS — F3289 Other specified depressive episodes: Secondary | ICD-10-CM | POA: Insufficient documentation

## 2013-06-23 DIAGNOSIS — Z862 Personal history of diseases of the blood and blood-forming organs and certain disorders involving the immune mechanism: Secondary | ICD-10-CM | POA: Insufficient documentation

## 2013-06-23 DIAGNOSIS — Z3202 Encounter for pregnancy test, result negative: Secondary | ICD-10-CM | POA: Insufficient documentation

## 2013-06-23 DIAGNOSIS — Z8639 Personal history of other endocrine, nutritional and metabolic disease: Secondary | ICD-10-CM | POA: Insufficient documentation

## 2013-06-23 DIAGNOSIS — Z8669 Personal history of other diseases of the nervous system and sense organs: Secondary | ICD-10-CM | POA: Insufficient documentation

## 2013-06-23 DIAGNOSIS — Z79899 Other long term (current) drug therapy: Secondary | ICD-10-CM | POA: Insufficient documentation

## 2013-06-23 DIAGNOSIS — R200 Anesthesia of skin: Secondary | ICD-10-CM

## 2013-06-23 DIAGNOSIS — R209 Unspecified disturbances of skin sensation: Secondary | ICD-10-CM | POA: Insufficient documentation

## 2013-06-23 LAB — CBC WITH DIFFERENTIAL/PLATELET
Basophils Absolute: 0.1 10*3/uL (ref 0.0–0.1)
Eosinophils Relative: 3 % (ref 0–5)
HCT: 40.3 % (ref 36.0–46.0)
Hemoglobin: 13.4 g/dL (ref 12.0–15.0)
Lymphocytes Relative: 26 % (ref 12–46)
Lymphs Abs: 3.1 10*3/uL (ref 0.7–4.0)
MCV: 90 fL (ref 78.0–100.0)
Monocytes Absolute: 1 10*3/uL (ref 0.1–1.0)
Monocytes Relative: 8 % (ref 3–12)
Neutro Abs: 7.4 10*3/uL (ref 1.7–7.7)
RBC: 4.48 MIL/uL (ref 3.87–5.11)
RDW: 14 % (ref 11.5–15.5)
WBC: 11.9 10*3/uL — ABNORMAL HIGH (ref 4.0–10.5)

## 2013-06-23 LAB — BASIC METABOLIC PANEL
CO2: 26 mEq/L (ref 19–32)
Calcium: 9.4 mg/dL (ref 8.4–10.5)
Chloride: 101 mEq/L (ref 96–112)
Creatinine, Ser: 0.7 mg/dL (ref 0.50–1.10)
Glucose, Bld: 111 mg/dL — ABNORMAL HIGH (ref 70–99)

## 2013-06-23 LAB — RAPID URINE DRUG SCREEN, HOSP PERFORMED
Amphetamines: NOT DETECTED
Barbiturates: NOT DETECTED
Benzodiazepines: NOT DETECTED
Cocaine: NOT DETECTED
Opiates: NOT DETECTED
Tetrahydrocannabinol: NOT DETECTED

## 2013-06-23 LAB — URINALYSIS, ROUTINE W REFLEX MICROSCOPIC
Glucose, UA: NEGATIVE mg/dL
Ketones, ur: NEGATIVE mg/dL
Leukocytes, UA: NEGATIVE
Nitrite: NEGATIVE
Protein, ur: NEGATIVE mg/dL
Urobilinogen, UA: 0.2 mg/dL (ref 0.0–1.0)

## 2013-06-23 LAB — PREGNANCY, URINE: Preg Test, Ur: NEGATIVE

## 2013-06-23 MED ORDER — SODIUM CHLORIDE 0.9 % IV BOLUS (SEPSIS)
1000.0000 mL | Freq: Once | INTRAVENOUS | Status: AC
  Administered 2013-06-23: 1000 mL via INTRAVENOUS

## 2013-06-23 NOTE — ED Notes (Signed)
Patient reports that she has been more emotional over the past week. Reports that she has had a lot of lower back pain the past 2 days that has prevented her from sleeping. Reports this am has had episodes of crying and laughter, states that she doesn't feel right. States that she has does have mood disorder but denies SI/HI thoughts or plan. Alert and oriented on arrival

## 2013-06-23 NOTE — ED Notes (Signed)
MD at bedside. 

## 2013-06-28 NOTE — ED Provider Notes (Signed)
CSN: [id number]     Arrival date & time 06/23/13  1220 History   First MD Initiated Contact with Patient 06/23/13 1240     Chief Complaint  Patient presents with  . Medical Clearance   (Consider location/radiation/quality/duration/timing/severity/associated sxs/prior Treatment) HPI Comments: Pt comes in with cc of sudden emotional lability. Pt has not been sleeping as well, she has been having episodes of crying and laughing - and doesn't feel "allright." She is not suicidal, homicidal, no hallucinations. She also reports some numbness to her upper extremities. Pt stopped taking zoloft about a week ago on her own. She denies any new stressors in her life.  The history is provided by the patient.    Past Medical History  Diagnosis Date  . Hyperlipidemia   . Sleep apnea   . Anxiety   . Depression    Past Surgical History  Procedure Laterality Date  . Appendectomy    . Feet surgery    . Wisdom tooth extraction    . Laproscopy    . Excision morton's neuroma Right 04/04/99    second interspace, right foot  . Excision morton's neuroma Left 04/04/99    second interspace, left foot   Family History  Problem Relation Age of Onset  . Hyperlipidemia Mother   . Sleep apnea Mother   . Diabetes Father   . Hypertension Father   . COPD Father   . Diabetes Paternal Aunt   . Diabetes Paternal Uncle   . Alcohol abuse Paternal Uncle   . Alcohol abuse Paternal Grandfather    History  Substance Use Topics  . Smoking status: Never Smoker   . Smokeless tobacco: Never Used  . Alcohol Use: No   OB History   Grav Para Term Preterm Abortions TAB SAB Ect Mult Living                 Review of Systems  Constitutional: Negative for activity change.  HENT: Negative for neck pain.   Respiratory: Negative for shortness of breath.   Cardiovascular: Negative for chest pain.  Gastrointestinal: Negative for nausea, vomiting and abdominal pain.  Genitourinary: Negative for dysuria.  Neurological:  Positive for numbness. Negative for headaches.    Allergies  Review of patient's allergies indicates no known allergies.  Home Medications   Current Outpatient Rx  Name  Route  Sig  Dispense  Refill  . ALPRAZolam (XANAX) 0.25 MG tablet   Oral   Take 0.25 mg by mouth as needed.         . lamoTRIgine (LAMICTAL) 150 MG tablet   Oral   Take 150 mg by mouth daily.         . sertraline (ZOLOFT) 100 MG tablet      Take 2 by mouth daily         . solifenacin (VESICARE) 5 MG tablet   Oral   Take 10 mg by mouth daily.          BP 133/83  Pulse 80  Temp(Src) 99 F (37.2 C) (Oral)  Resp 16  SpO2 99%  LMP 06/12/2013 Physical Exam  Nursing note and vitals reviewed. Constitutional: She is oriented to person, place, and time. She appears well-developed and well-nourished.  HENT:  Head: Normocephalic and atraumatic.  Eyes: EOM are normal. Pupils are equal, round, and reactive to light.  Neck: Neck supple.  Cardiovascular: Normal rate, regular rhythm and normal heart sounds.   No murmur heard. Pulmonary/Chest: Effort normal. No respiratory distress.  Abdominal:  Soft. She exhibits no distension. There is no tenderness. There is no rebound and no guarding.  Neurological: She is alert and oriented to person, place, and time. No cranial nerve deficit.  Cerebellar exam is normal (finger to nose) Sensory exam normal for bilateral upper and lower extremities - and patient is able to discriminate between sharp and dull.    Skin: Skin is warm and dry.    ED Course  Procedures (including critical care time) Labs Review Labs Reviewed  CBC WITH DIFFERENTIAL - Abnormal; Notable for the following:    WBC 11.9 (*)    All other components within normal limits  BASIC METABOLIC PANEL - Abnormal; Notable for the following:    Glucose, Bld 111 (*)    All other components within normal limits  URINALYSIS, ROUTINE W REFLEX MICROSCOPIC  PREGNANCY, URINE  URINE RAPID DRUG SCREEN (HOSP  PERFORMED)   Imaging Review No results found.  MDM   1. Numbness    Pt comes in with cc of numbness, and some emotional lability. Neuro exam is non focal, numbness is bilateral. No concerns for stroke - which was patient's main concern, as she had numbness and other constellation of neuro sx.  Emotional complains are not emergent, and she is psychologically stable. Psych f/u recommended.   Derwood Kaplan, MD 06/28/13 2054

## 2013-06-29 ENCOUNTER — Encounter: Payer: Self-pay | Admitting: Physician Assistant

## 2013-06-29 ENCOUNTER — Ambulatory Visit (INDEPENDENT_AMBULATORY_CARE_PROVIDER_SITE_OTHER): Payer: 59 | Admitting: Physician Assistant

## 2013-06-29 VITALS — BP 144/89 | HR 98 | Wt 223.0 lb

## 2013-06-29 DIAGNOSIS — L732 Hidradenitis suppurativa: Secondary | ICD-10-CM

## 2013-06-29 DIAGNOSIS — E669 Obesity, unspecified: Secondary | ICD-10-CM

## 2013-06-29 DIAGNOSIS — L0292 Furuncle, unspecified: Secondary | ICD-10-CM

## 2013-06-29 DIAGNOSIS — Z6836 Body mass index (BMI) 36.0-36.9, adult: Secondary | ICD-10-CM

## 2013-06-29 MED ORDER — DOXYCYCLINE HYCLATE 100 MG PO CAPS: 100.0000 mg | ORAL_CAPSULE | Freq: Two times a day (BID) | ORAL | Status: DC

## 2013-06-29 MED ORDER — PHENTERMINE HCL 15 MG PO CAPS: 15.0000 mg | ORAL_CAPSULE | ORAL | Status: DC

## 2013-06-29 NOTE — Patient Instructions (Addendum)
APP: Lose it/My fitness pal  Calories 1300-1500 calories a day.  Nutritionist referral to be made.   Hidradenitis Suppurativa, Sweat Gland Abscess Hidradenitis suppurativa is a long lasting (chronic), uncommon disease of the sweat glands. With this, boil-like lumps and scarring develop in the groin, some times under the arms (axillae), and under the breasts. It may also uncommonly occur behind the ears, in the crease of the buttocks, and around the genitals.  CAUSES  The cause is from a blocking of the sweat glands. They then become infected. It may cause drainage and odor. It is not contagious. So it cannot be given to someone else. It most often shows up in puberty (about 44 to 45 years of age). But it may happen much later. It is similar to acne which is a disease of the sweat glands. This condition is slightly more common in African-Americans and women. SYMPTOMS   Hidradenitis usually starts as one or more red, tender, swellings in the groin or under the arms (axilla).  Over a period of hours to days the lesions get larger. They often open to the skin surface, draining clear to yellow-colored fluid.  The infected area heals with scarring. DIAGNOSIS  Your caregiver makes this diagnosis by looking at you. Sometimes cultures (growing germs on plates in the lab) may be taken. This is to see what germ (bacterium) is causing the infection.  TREATMENT   Topical germ killing medicine applied to the skin (antibiotics) are the treatment of choice. Antibiotics taken by mouth (systemic) are sometimes needed when the condition is getting worse or is severe.  Avoid tight-fitting clothing which traps moisture in.  Dirt does not cause hidradenitis and it is not caused by poor hygiene.  Involved areas should be cleaned daily using an antibacterial soap. Some patients find that the liquid form of Lever 2000, applied to the involved areas as a lotion after bathing, can help reduce the odor related to this  condition.  Sometimes surgery is needed to drain infected areas or remove scarred tissue. Removal of large amounts of tissue is used only in severe cases.  Birth control pills may be helpful.  Oral retinoids (vitamin A derivatives) for 6 to 12 months which are effective for acne may also help this condition.  Weight loss will improve but not cure hidradenitis. It is made worse by being overweight. But the condition is not caused by being overweight.  This condition is more common in people who have had acne.  It may become worse under stress. There is no medical cure for hidradenitis. It can be controlled, but not cured. The condition usually continues for years with periods of getting worse and getting better (remission). Document Released: 05/20/2004 Document Revised: 12/29/2011 Document Reviewed: 06/05/2008 [facility] Patient Information 2014 [place], [place].

## 2013-06-29 NOTE — Progress Notes (Signed)
  Subjective:    Patient ID: Shannon [name], female    DOB: 1966-07-04, 47 y.o.   MRN: [id number]  HPI Patient is a 47 year old female who presents to the clinic with a boil on her left inner thigh and would like to discuss weight loss.  Patient has boils but will pop up in her inner thighs on both legs occasionally. She is gaining weight she notices they're coming more frequently. Sometimes they will pop on their own and drained and then resolve. This most recent boil has been present for the last week and a half and does not seem to be improving. It is very tender and warm to the touch. She has not tried anything to make better. Percussion seems to make worse. She denies any fever, chills, nausea, vomiting. She denies any urinary symptoms.  Patient is very concerned with her weight currently. She reports she has no self-control. She has tried phentermine in the past but did not feel like if her any benefit. She did take it at different times of the day and wonders if that could have been 1 it did not give her benefit. She has a membership to the clinic fitness but is not using it. 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 She does see the orthopedist on September 22. She is aware that her diet is very uncontrolled as well.     Review of Systems     Objective:   Physical Exam  Constitutional: She is oriented to person, place, and time. She appears well-developed and well-nourished.  Obese.  HENT:  Head: Normocephalic and atraumatic.  Cardiovascular: Normal rate, regular rhythm and normal heart sounds.   Pulmonary/Chest: Effort normal and breath sounds normal. She has no wheezes.  Neurological: She is alert and oriented to person, place, and time.  Skin:  Bilateral groin area are tracks of where boils were present and then healed. Boil on left inner thigh 1cm but 1cm. Not opened or draining. Tender to touch. Warm around area.   Psychiatric: She has a normal mood and  affect. Her behavior is normal.          Assessment & Plan:  hidradenitis suppurativa- Gave handout. Discussed keeping clean with dial soap. Discussed how weight loss could improve this. Gave Doxy for 10 days. Call if not improving. I did not feel today that I needed to excise and drain. I think abx will cover.   Obesity- discussed with pt about the fact that she is on mood medication and her on motivation is low contributing to not much success with weight loss. Discuss with patient to load onto her phone for encouragement such as my fitness panel and lose it. Talked about counting her calories just to see how many she is consuming a day and maybe to bring like to decrease him by 300-500 a day. I will get a half dose of phentermine and told patient to take in the mornings before her today. Patient is aware that could increase her blood pressure and/or pulse. We'll monitor in one month. I know she can not exercise vigorously encouraged her to incorporate walking plan for at least 30 minutes a day. Discussed she might be a good candidate for belviq however we need to try to diet and exercise due to insurance coverage. I do think pt would benefit from nutrition referral.

## 2013-07-18 ENCOUNTER — Ambulatory Visit: Payer: 59 | Admitting: Physical Therapy

## 2013-07-20 ENCOUNTER — Ambulatory Visit: Payer: 59 | Attending: Physician Assistant

## 2013-07-20 DIAGNOSIS — IMO0001 Reserved for inherently not codable concepts without codable children: Secondary | ICD-10-CM | POA: Insufficient documentation

## 2013-07-20 DIAGNOSIS — M2560 Stiffness of unspecified joint, not elsewhere classified: Secondary | ICD-10-CM | POA: Insufficient documentation

## 2013-07-20 DIAGNOSIS — M6281 Muscle weakness (generalized): Secondary | ICD-10-CM | POA: Insufficient documentation

## 2013-07-20 DIAGNOSIS — M545 Low back pain, unspecified: Secondary | ICD-10-CM | POA: Insufficient documentation

## 2013-07-20 DIAGNOSIS — R293 Abnormal posture: Secondary | ICD-10-CM | POA: Insufficient documentation

## 2013-08-02 ENCOUNTER — Ambulatory Visit: Payer: 59 | Admitting: Dietician

## 2013-11-11 ENCOUNTER — Encounter: Payer: Self-pay | Admitting: Physician Assistant

## 2013-11-11 ENCOUNTER — Ambulatory Visit (INDEPENDENT_AMBULATORY_CARE_PROVIDER_SITE_OTHER): Payer: 59 | Admitting: Physician Assistant

## 2013-11-11 VITALS — BP 124/79 | HR 111 | Temp 98.0°F | Wt 225.0 lb

## 2013-11-11 DIAGNOSIS — J069 Acute upper respiratory infection, unspecified: Secondary | ICD-10-CM

## 2013-11-11 DIAGNOSIS — R6889 Other general symptoms and signs: Secondary | ICD-10-CM

## 2013-11-11 MED ORDER — BENZONATATE 200 MG PO CAPS: 200.0000 mg | ORAL_CAPSULE | Freq: Three times a day (TID) | ORAL | Status: DC | PRN

## 2013-11-11 MED ORDER — AMOXICILLIN 500 MG PO TABS: 500.0000 mg | ORAL_TABLET | Freq: Two times a day (BID) | ORAL | Status: DC

## 2013-11-11 NOTE — Progress Notes (Signed)
   Subjective:    Patient ID: Arleta Creek, female    DOB: [birth year], 48 y.o.   MRN: [id number]  HPI Pt presents to the clinic with flu like symptoms. 2 days ago pt started having body ache and fever of 100. Yesterday she started vomiting. She has not vomited today. She does have ST and cough that was productive with clear sputum. No ear pain or abdominal pain. Cough was painful around ribs due to coughing so hard. No diarrhea. Taking ibuprofen and tylenol. Helping some with symptoms. No wheezing or SOB.    Review of Systems     Objective:   Physical Exam  Constitutional: She is oriented to person, place, and time. She appears well-developed and well-nourished.  HENT:  Head: Normocephalic and atraumatic.  Right Ear: External ear normal.  Left Ear: External ear normal.  Nose: Nose normal.  Mouth/Throat: Oropharynx is clear and moist.  TM's clear bilaterally.    Eyes: Conjunctivae are normal. Right eye exhibits no discharge. Left eye exhibits no discharge.  Neck: Normal range of motion. Neck supple.  Cardiovascular: Regular rhythm and normal heart sounds.   Tachycardia at 111.   Pulmonary/Chest: Effort normal and breath sounds normal. She has no wheezes.  Lymphadenopathy:    She has no cervical adenopathy.  Neurological: She is alert and oriented to person, place, and time.          Assessment & Plan:  URI- Discussed likely viral. Outside of flu window and low suspsion for. Gave symptomatic care instructions. Vitamin C, Zinc, ibuprofen, and mucinex were encouraged. Since weekend I did give amoxil to use if not improving or symptoms worsening. Tessalon were given for cough. Pt declined anything for nausea. Stay hydrated. Wrote out of work for another day.

## 2013-11-11 NOTE — Patient Instructions (Addendum)
Zinc and vitamin C. Mucinex DM.   Upper Respiratory Infection, Adult An upper respiratory infection (URI) is also known as the common cold. It is often caused by a type of germ (virus). Colds are easily spread (contagious). You can pass it to others by kissing, coughing, sneezing, or drinking out of the same glass. Usually, you get better in 1 or 2 weeks.  HOME CARE   Only take medicine as told by your doctor.  Use a warm mist humidifier or breathe in steam from a hot shower.  Drink enough water and fluids to keep your pee (urine) clear or pale yellow.  Get plenty of rest.  Return to work when your temperature is back to normal or as told by your doctor. You may use a face mask and wash your hands to stop your cold from spreading. GET HELP RIGHT AWAY IF:   After the first few days, you feel you are getting worse.  You have questions about your medicine.  You have chills, shortness of breath, or brown or red spit (mucus).  You have yellow or brown snot (nasal discharge) or pain in the face, especially when you bend forward.  You have a fever, puffy (swollen) neck, pain when you swallow, or white spots in the back of your throat.  You have a bad headache, ear pain, sinus pain, or chest pain.  You have a high-pitched whistling sound when you breathe in and out (wheezing).  You have a lasting cough or cough up blood.  You have sore muscles or a stiff neck. MAKE SURE YOU:   Understand these instructions.  Will watch your condition.  Will get help right away if you are not doing well or get worse. Document Released: 03/24/2008 Document Revised: 12/29/2011 Document Reviewed: 02/10/2011 [facility] Patient Information 2014 [place], [place].

## 2013-12-12 ENCOUNTER — Telehealth: Payer: Self-pay | Admitting: Physician Assistant

## 2013-12-12 NOTE — Telephone Encounter (Signed)
Pt called. She believes she may need another sleep test done(none since '09) because she's feeling tired. She does use a c-pap machine which uses for 1hr every night.

## 2013-12-12 NOTE — Telephone Encounter (Signed)
Does she still use [facility] for supplies? I could send referral there for another sleep study. Is there a reason you only use for 1 hour?

## 2013-12-14 NOTE — Telephone Encounter (Signed)
Spoke with pt & she would like another referral for sleep study sent to [facility] or [place].  She states that she has only been able to wear for about an hour at a time due to feeling like she "is smothering".

## 2013-12-19 ENCOUNTER — Other Ambulatory Visit: Payer: Self-pay | Admitting: Physician Assistant

## 2013-12-19 DIAGNOSIS — Z91199 Patient's noncompliance with other medical treatment and regimen due to unspecified reason: Secondary | ICD-10-CM

## 2013-12-19 DIAGNOSIS — G4733 Obstructive sleep apnea (adult) (pediatric): Secondary | ICD-10-CM

## 2013-12-19 DIAGNOSIS — Z9114 Patient's other noncompliance with medication regimen: Secondary | ICD-10-CM

## 2013-12-19 DIAGNOSIS — R5381 Other malaise: Secondary | ICD-10-CM

## 2013-12-19 DIAGNOSIS — R5383 Other fatigue: Secondary | ICD-10-CM

## 2013-12-19 NOTE — Telephone Encounter (Signed)
Ok referral placed.

## 2013-12-20 NOTE — Telephone Encounter (Signed)
Pt notified of referral  

## 2014-01-31 ENCOUNTER — Ambulatory Visit (HOSPITAL_BASED_OUTPATIENT_CLINIC_OR_DEPARTMENT_OTHER): Payer: 59 | Attending: Family Medicine | Admitting: Radiology

## 2014-01-31 VITALS — Ht 66.0 in | Wt 225.0 lb

## 2014-01-31 DIAGNOSIS — G4710 Hypersomnia, unspecified: Secondary | ICD-10-CM | POA: Insufficient documentation

## 2014-01-31 DIAGNOSIS — Z9989 Dependence on other enabling machines and devices: Secondary | ICD-10-CM

## 2014-01-31 DIAGNOSIS — G4733 Obstructive sleep apnea (adult) (pediatric): Secondary | ICD-10-CM

## 2014-01-31 DIAGNOSIS — G4730 Sleep apnea, unspecified: Principal | ICD-10-CM

## 2014-02-04 DIAGNOSIS — G4730 Sleep apnea, unspecified: Secondary | ICD-10-CM

## 2014-02-04 DIAGNOSIS — G4710 Hypersomnia, unspecified: Secondary | ICD-10-CM

## 2014-02-04 NOTE — Sleep Study (Signed)
   NAME: Shannon [name] DATE OF BIRTH:  November 04, 1965 MEDICAL RECORD NUMBER [id number]  [facility]  PHYSICIAN: Sadiyah Kangas D Kaoir Loree  DATE OF STUDY: 01/31/2014  SLEEP STUDY TYPE: Nocturnal Polysomnogram               REFERRING PHYSICIAN: Breeback, Jade L, PA-C  INDICATION FOR STUDY:  hypersomnia with sleep apnea  EPWORTH SLEEPINESS SCORE:   16/24 HEIGHT: 5\' 6"  (167.6 cm)  WEIGHT: 225 lb (102.059 kg)    Body mass index is 36.33 kg/(m^2).  NECK SIZE: 15 in.  MEDICATIONS: Charted for review  SLEEP ARCHITECTURE: A split study protocol. During the diagnostic phase, total sleep time 150 minutes with sleep efficiency 93.2%. Stage I was 6.7%, stage II 67.3%, stage III 26%, REM absent. Sleep latency 6 minutes, awake after sleep onset 3 minutes, arousal index 28.8, bedtime medication: None  RESPIRATORY DATA: Apnea hypopnea index (AHI) 46 per hour. 115 total events scored including 9 obstructive apneas and 106 hypopneas. Events were associated with non-supine sleep position. CPAP was titrated to 14 CWP, AHI 0 per hour. She wore a medium ResMed air fit F. 10 fullface mask with heated humidifier and an EPR of 2.  OXYGEN DATA: Moderate snoring before CPAP with oxygen desaturation to a nadir of 85% on room air. With CPAP control, snoring was prevented and mean oxygen saturation of 94.1% on room air.  CARDIAC DATA: Sinus rhythm with PACs  MOVEMENT/PARASOMNIA: No significant movement disturbance, bathroom x1  IMPRESSION/ RECOMMENDATION:   1) Severe obstructive sleep apnea/hypopneas syndrome, AHI 46 per hour with nonsupine events. Moderate snoring with oxygen desaturation to a nadir of 85% on room air. 2) Successful CPAP titration to 14 CWP, AHI 0 per hour. She wore a medium ResMed air fit F- 10 fullface mask with heated humidifier and an EPR of 2. Starting was prevented and mean oxygen saturation held 94.1% on room air with CPAP.   Signed Baird Lyons M.D. [place], [facility]  ELECTRONICALLY SIGNED ON:  02/04/2014, 3:51 PM [place] PH: [phone]   FX: [phone] [address] [place]

## 2014-02-13 ENCOUNTER — Encounter: Payer: Self-pay | Admitting: Physician Assistant

## 2014-02-13 ENCOUNTER — Telehealth: Payer: Self-pay | Admitting: Physician Assistant

## 2014-02-13 NOTE — Telephone Encounter (Signed)
Ok I am more than willing to get set up but didn't know if they were or we were.

## 2014-02-13 NOTE — Telephone Encounter (Signed)
Confirm with pt she is on CPAP and doing well? Let pt know that after starting Cpap some time within 90 days needs follow up appt for insurance purposes and then yearly.

## 2014-02-13 NOTE — Telephone Encounter (Signed)
Pt stated that she has not been using her cpap.  She states it wasn't helping. She just had another sleep study & is waiting to hear if they are going to give her a new cpap with a different type of mask.  She said she was going by there tomorrow to find out so I told her to let us know what they tell her & that she needs to f/u 90 days from that.

## 2014-02-13 NOTE — Telephone Encounter (Signed)
Pt called back & left vm stating that [facility] was suppose to be sending [place] a form or some type of referral for [place] to fill out & send back to them for a new CPAP.

## 2014-02-14 NOTE — Telephone Encounter (Signed)
Please contact [facility] and see if they can send paperwork for me to fill out.

## 2014-03-29 ENCOUNTER — Encounter: Payer: Self-pay | Admitting: Physician Assistant

## 2014-03-29 ENCOUNTER — Ambulatory Visit (INDEPENDENT_AMBULATORY_CARE_PROVIDER_SITE_OTHER): Payer: 59 | Admitting: Physician Assistant

## 2014-03-29 VITALS — BP 115/70 | HR 94 | Ht 66.0 in | Wt 230.0 lb

## 2014-03-29 DIAGNOSIS — R635 Abnormal weight gain: Secondary | ICD-10-CM

## 2014-03-29 DIAGNOSIS — E669 Obesity, unspecified: Secondary | ICD-10-CM

## 2014-03-29 DIAGNOSIS — G4733 Obstructive sleep apnea (adult) (pediatric): Secondary | ICD-10-CM

## 2014-03-29 DIAGNOSIS — Z9989 Dependence on other enabling machines and devices: Secondary | ICD-10-CM

## 2014-03-29 MED ORDER — NALTREXONE-BUPROPION HCL ER 8-90 MG PO TB12: ORAL_TABLET | ORAL | Status: DC

## 2014-03-29 NOTE — Progress Notes (Signed)
   Subjective:    Patient ID: Shannon [name], female    DOB: [birth year], 48 y.o.   MRN: [id number]  HPI  Patient is a 48 year old female who presents to the clinic to followup on starting CPAP. She reports she's been using every night for a little over one month. She approximately wears it for 5-8 hours nightly. She does feel like it is working properly she has no complaints. She does feel like she has a little more energy.  At this point she is very concerned with her weight. She feels like her weight and self-confidence is also weighing her down. She has tried phentermine in the past and it did work but since she has tried and not been effective. She really struggles with cravings. She works third shift and it's really hard not to drink soft drinks and eat sugary foods. She admits she is not exercising. She would like some motivation.   Review of Systems  All other systems reviewed and are negative.      Objective:   Physical Exam  Constitutional: She is oriented to person, place, and time. She appears well-developed and well-nourished.  Obese  HENT:  Head: Normocephalic and atraumatic.  Cardiovascular: Normal rate, regular rhythm and normal heart sounds.   Pulmonary/Chest: Effort normal and breath sounds normal. She has no wheezes.  Neurological: She is alert and oriented to person, place, and time.  Skin: Skin is dry.  Psychiatric: She has a normal mood and affect. Her behavior is normal.          Assessment & Plan:    OSA on CPaP- patient is doing very well on slice. She has no concerns or complaints. Discussed if she did have any concerns or complaints to call CPAP company first. Continue to use nightly for best benefits.  Obesity/abnormal weight gain- discussed options to try Belviq, Qsymia and contrave. That contrast has a scale down program that focuses on diet and exercise I think would be a great component. I also feel like since cravings are more of her issue that the  component could benefit her. Discussed gradual increase. If she has any changes in her mood or anxiety level please call office and stop medication. Discuss other potential side effects with patient. I did give her a coupon cards sent she will be a self pay. Followup in 4-6 weeks. Encouraged patient that the pill is only as good as diet and exercise. Take this time to really focus on making some small changes that in the and will be picked changes.  Spent 30 minutes with patient greater than 50% of patient time spent counseling patient regarding weight loss.

## 2014-03-31 DIAGNOSIS — Z9989 Dependence on other enabling machines and devices: Secondary | ICD-10-CM

## 2014-03-31 DIAGNOSIS — R635 Abnormal weight gain: Secondary | ICD-10-CM | POA: Insufficient documentation

## 2014-03-31 DIAGNOSIS — G4733 Obstructive sleep apnea (adult) (pediatric): Secondary | ICD-10-CM | POA: Insufficient documentation

## 2014-03-31 DIAGNOSIS — Z6837 Body mass index (BMI) 37.0-37.9, adult: Secondary | ICD-10-CM | POA: Insufficient documentation

## 2014-03-31 DIAGNOSIS — E669 Obesity, unspecified: Secondary | ICD-10-CM | POA: Insufficient documentation

## 2014-04-04 ENCOUNTER — Telehealth: Payer: Self-pay | Admitting: *Deleted

## 2014-04-04 NOTE — Telephone Encounter (Signed)
Stop contrave. What would she like to try belviq or qsymia?

## 2014-04-04 NOTE — Telephone Encounter (Signed)
Pt left vm stating that the Contrave is making her extremely sleepy & she has "nervousness" in her hands.  She states that she is still at the one tab a day & is worried how she would feel at the 4 tabs a day.  She's wanting to know if she needs to switch to one of the other meds you guys discussed.  Please advise.

## 2014-04-05 ENCOUNTER — Other Ambulatory Visit: Payer: Self-pay | Admitting: Physician Assistant

## 2014-04-05 MED ORDER — LORCASERIN HCL 10 MG PO TABS: 1.0000 | ORAL_TABLET | Freq: Two times a day (BID) | ORAL | Status: DC

## 2014-04-05 NOTE — Telephone Encounter (Signed)
Pt would like to try belviq.

## 2014-04-05 NOTE — Telephone Encounter (Signed)
Give her sample card a free sample and see how she tolerates. i will print rx.

## 2014-05-15 ENCOUNTER — Ambulatory Visit: Payer: 59 | Admitting: Physician Assistant

## 2014-08-07 ENCOUNTER — Ambulatory Visit: Payer: 59 | Admitting: Physician Assistant

## 2014-08-14 ENCOUNTER — Encounter: Payer: Self-pay | Admitting: Physician Assistant

## 2014-08-14 ENCOUNTER — Ambulatory Visit (INDEPENDENT_AMBULATORY_CARE_PROVIDER_SITE_OTHER): Payer: 59 | Admitting: Physician Assistant

## 2014-08-14 ENCOUNTER — Other Ambulatory Visit: Payer: Self-pay | Admitting: *Deleted

## 2014-08-14 VITALS — BP 151/99 | HR 92 | Ht 65.0 in | Wt 239.0 lb

## 2014-08-14 DIAGNOSIS — R202 Paresthesia of skin: Secondary | ICD-10-CM

## 2014-08-14 DIAGNOSIS — Z13220 Encounter for screening for lipoid disorders: Secondary | ICD-10-CM

## 2014-08-14 DIAGNOSIS — E119 Type 2 diabetes mellitus without complications: Secondary | ICD-10-CM

## 2014-08-14 DIAGNOSIS — Z79899 Other long term (current) drug therapy: Secondary | ICD-10-CM

## 2014-08-14 DIAGNOSIS — F39 Unspecified mood [affective] disorder: Secondary | ICD-10-CM

## 2014-08-14 LAB — POCT GLYCOSYLATED HEMOGLOBIN (HGB A1C): HEMOGLOBIN A1C: 6.5

## 2014-08-14 MED ORDER — MELOXICAM 15 MG PO TABS: 15.0000 mg | ORAL_TABLET | Freq: Every day | ORAL | Status: DC

## 2014-08-14 MED ORDER — PREDNISONE 50 MG PO TABS: ORAL_TABLET | ORAL | Status: DC

## 2014-08-14 MED ORDER — METFORMIN HCL 500 MG PO TABS: 500.0000 mg | ORAL_TABLET | Freq: Two times a day (BID) | ORAL | Status: DC

## 2014-08-14 NOTE — Patient Instructions (Addendum)
Peripheral Edema You have swelling in your legs (peripheral edema). This swelling is due to excess accumulation of salt and water in your body. Edema may be a sign of heart, kidney or liver disease, or a side effect of a medication. It may also be due to problems in the leg veins. Elevating your legs and using special support stockings may be very helpful, if the cause of the swelling is due to poor venous circulation. Avoid long periods of standing, whatever the cause. Treatment of edema depends on identifying the cause. Chips, pretzels, pickles and other salty foods should be avoided. Restricting salt in your diet is almost always needed. Water pills (diuretics) are often used to remove the excess salt and water from your body via urine. These medicines prevent the kidney from reabsorbing sodium. This increases urine flow. Diuretic treatment may also result in lowering of potassium levels in your body. Potassium supplements may be needed if you have to use diuretics daily. Daily weights can help you keep track of your progress in clearing your edema. You should call your caregiver for follow up care as recommended. SEEK IMMEDIATE MEDICAL CARE IF:   You have increased swelling, pain, redness, or heat in your legs.  You develop shortness of breath, especially when lying down.  You develop chest or abdominal pain, weakness, or fainting.  You have a fever. Document Released: 11/13/2004 Document Revised: 12/29/2011 Document Reviewed: 10/24/2009 [facility] Patient Information 2015 [place], [place]. This information is not intended to replace advice given to you by your health care provider. Make sure you discuss any questions you have with your health care provider. Low Back Sprain with Rehab  A sprain is an injury in which a ligament is torn. The ligaments of the lower back are vulnerable to sprains. However, they are strong and require great force to be injured. These ligaments are important for  stabilizing the spinal column. Sprains are classified into three categories. Grade 1 sprains cause pain, but the tendon is not lengthened. Grade 2 sprains include a lengthened ligament, due to the ligament being stretched or partially ruptured. With grade 2 sprains there is still function, although the function may be decreased. Grade 3 sprains involve a complete tear of the tendon or muscle, and function is usually impaired. SYMPTOMS   Severe pain in the lower back.  Sometimes, a feeling of a "pop," "snap," or tear, at the time of injury.  Tenderness and sometimes swelling at the injury site.  Uncommonly, bruising (contusion) within 48 hours of injury.  Muscle spasms in the back. CAUSES  Low back sprains occur when a force is placed on the ligaments that is greater than they can handle. Common causes of injury include:  Performing a stressful act while off-balance.  Repetitive stressful activities that involve movement of the lower back.  Direct hit (trauma) to the lower back. RISK INCREASES WITH:  Contact sports (football, wrestling).  Collisions (major skiing accidents).  Sports that require throwing or lifting (baseball, weightlifting).  Sports involving twisting of the spine (gymnastics, diving, tennis, golf).  Poor strength and flexibility.  Inadequate protection.  Previous back injury or surgery (especially fusion). PREVENTION  Wear properly fitted and padded protective equipment.  Warm up and stretch properly before activity.  Allow for adequate recovery between workouts.  Maintain physical fitness:  Strength, flexibility, and endurance.  Cardiovascular fitness.  Maintain a healthy body weight. PROGNOSIS  If treated properly, low back sprains usually heal with non-surgical treatment. The length of time for  healing depends on the severity of the injury.  RELATED COMPLICATIONS   Recurring symptoms, resulting in a chronic problem.  Chronic inflammation and  pain in the low back.  Delayed healing or resolution of symptoms, especially if activity is resumed too soon.  Prolonged impairment.  Unstable or arthritic joints of the low back. TREATMENT  Treatment first involves the use of ice and medicine, to reduce pain and inflammation. The use of strengthening and stretching exercises may help reduce pain with activity. These exercises may be performed at home or with a therapist. Severe injuries may require referral to a therapist for further evaluation and treatment, such as ultrasound. Your caregiver may advise that you wear a back brace or corset, to help reduce pain and discomfort. Often, prolonged bed rest results in greater harm then benefit. Corticosteroid injections may be recommended. However, these should be reserved for the most serious cases. It is important to avoid using your back when lifting objects. At night, sleep on your back on a firm mattress, with a pillow placed under your knees. If non-surgical treatment is unsuccessful, surgery may be needed.  MEDICATION   If pain medicine is needed, nonsteroidal anti-inflammatory medicines (aspirin and ibuprofen), or other minor pain relievers (acetaminophen), are often advised.  Do not take pain medicine for 7 days before surgery.  Prescription pain relievers may be given, if your caregiver thinks they are needed. Use only as directed and only as much as you need.  Ointments applied to the skin may be helpful.  Corticosteroid injections may be given by your caregiver. These injections should be reserved for the most serious cases, because they may only be given a certain number of times. HEAT AND COLD  Cold treatment (icing) should be applied for 10 to 15 minutes every 2 to 3 hours for inflammation and pain, and immediately after activity that aggravates your symptoms. Use ice packs or an ice massage.  Heat treatment may be used before performing stretching and strengthening activities  prescribed by your caregiver, physical therapist, or athletic trainer. Use a heat pack or a warm water soak. SEEK MEDICAL CARE IF:   Symptoms get worse or do not improve in 2 to 4 weeks, despite treatment.  You develop numbness or weakness in either leg.  You lose bowel or bladder function.  Any of the following occur after surgery: fever, increased pain, swelling, redness, drainage of fluids, or bleeding in the affected area.  New, unexplained symptoms develop. (Drugs used in treatment may produce side effects.) EXERCISES  RANGE OF MOTION (ROM) AND STRETCHING EXERCISES - Low Back Sprain Most people with lower back pain will find that their symptoms get worse with excessive bending forward (flexion) or arching at the lower back (extension). The exercises that will help resolve your symptoms will focus on the opposite motion.  Your physician, physical therapist or athletic trainer will help you determine which exercises will be most helpful to resolve your lower back pain. Do not complete any exercises without first consulting with your caregiver. Discontinue any exercises which make your symptoms worse, until you speak to your caregiver. If you have pain, numbness or tingling which travels down into your buttocks, leg or foot, the goal of the therapy is for these symptoms to move closer to your back and eventually resolve. Sometimes, these leg symptoms will get better, but your lower back pain may worsen. This is often an indication of progress in your rehabilitation. Be very alert to any changes in your symptoms  and the activities in which you participated in the 24 hours prior to the change. Sharing this information with your caregiver will allow him or her to most efficiently treat your condition. These exercises may help you when beginning to rehabilitate your injury. Your symptoms may resolve with or without further involvement from your physician, physical therapist or athletic trainer. While  completing these exercises, remember:   Restoring tissue flexibility helps normal motion to return to the joints. This allows healthier, less painful movement and activity.  An effective stretch should be held for at least 30 seconds.  A stretch should never be painful. You should only feel a gentle lengthening or release in the stretched tissue. FLEXION RANGE OF MOTION AND STRETCHING EXERCISES: STRETCH - Flexion, Single Knee to Chest   Lie on a firm bed or floor with both legs extended in front of you.  Keeping one leg in contact with the floor, bring your opposite knee to your chest. Hold your leg in place by either grabbing behind your thigh or at your knee.  Pull until you feel a gentle stretch in your low back. Hold __________ seconds.  Slowly release your grasp and repeat the exercise with the opposite side. Repeat __________ times. Complete this exercise __________ times per day.  STRETCH - Flexion, Double Knee to Chest  Lie on a firm bed or floor with both legs extended in front of you.  Keeping one leg in contact with the floor, bring your opposite knee to your chest.  Tense your stomach muscles to support your back and then lift your other knee to your chest. Hold your legs in place by either grabbing behind your thighs or at your knees.  Pull both knees toward your chest until you feel a gentle stretch in your low back. Hold __________ seconds.  Tense your stomach muscles and slowly return one leg at a time to the floor. Repeat __________ times. Complete this exercise __________ times per day.  STRETCH - Low Trunk Rotation  Lie on a firm bed or floor. Keeping your legs in front of you, bend your knees so they are both pointed toward the ceiling and your feet are flat on the floor.  Extend your arms out to the side. This will stabilize your upper body by keeping your shoulders in contact with the floor.  Gently and slowly drop both knees together to one side until you  feel a gentle stretch in your low back. Hold for __________ seconds.  Tense your stomach muscles to support your lower back as you bring your knees back to the starting position. Repeat the exercise to the other side. Repeat __________ times. Complete this exercise __________ times per day  EXTENSION RANGE OF MOTION AND FLEXIBILITY EXERCISES: STRETCH - Extension, Prone on Elbows   Lie on your stomach on the floor, a bed will be too soft. Place your palms about shoulder width apart and at the height of your head.  Place your elbows under your shoulders. If this is too painful, stack pillows under your chest.  Allow your body to relax so that your hips drop lower and make contact more completely with the floor.  Hold this position for __________ seconds.  Slowly return to lying flat on the floor. Repeat __________ times. Complete this exercise __________ times per day.  RANGE OF MOTION - Extension, Prone Press Ups  Lie on your stomach on the floor, a bed will be too soft. Place your palms about shoulder width apart  and at the height of your head.  Keeping your back as relaxed as possible, slowly straighten your elbows while keeping your hips on the floor. You may adjust the placement of your hands to maximize your comfort. As you gain motion, your hands will come more underneath your shoulders.  Hold this position __________ seconds.  Slowly return to lying flat on the floor. Repeat __________ times. Complete this exercise __________ times per day.  RANGE OF MOTION- Quadruped, Neutral Spine   Assume a hands and knees position on a firm surface. Keep your hands under your shoulders and your knees under your hips. You may place padding under your knees for comfort.  Drop your head and point your tailbone toward the ground below you. This will round out your lower back like an angry cat. Hold this position for __________ seconds.  Slowly lift your head and release your tail bone so that  your back sags into a large arch, like an old horse.  Hold this position for __________ seconds.  Repeat this until you feel limber in your low back.  Now, find your "sweet spot." This will be the most comfortable position somewhere between the two previous positions. This is your neutral spine. Once you have found this position, tense your stomach muscles to support your low back.  Hold this position for __________ seconds. Repeat __________ times. Complete this exercise __________ times per day.  STRENGTHENING EXERCISES - Low Back Sprain These exercises may help you when beginning to rehabilitate your injury. These exercises should be done near your "sweet spot." This is the neutral, low-back arch, somewhere between fully rounded and fully arched, that is your least painful position. When performed in this safe range of motion, these exercises can be used for people who have either a flexion or extension based injury. These exercises may resolve your symptoms with or without further involvement from your physician, physical therapist or athletic trainer. While completing these exercises, remember:   Muscles can gain both the endurance and the strength needed for everyday activities through controlled exercises.  Complete these exercises as instructed by your physician, physical therapist or athletic trainer. Increase the resistance and repetitions only as guided.  You may experience muscle soreness or fatigue, but the pain or discomfort you are trying to eliminate should never worsen during these exercises. If this pain does worsen, stop and make certain you are following the directions exactly. If the pain is still present after adjustments, discontinue the exercise until you can discuss the trouble with your caregiver. STRENGTHENING - Deep Abdominals, Pelvic Tilt   Lie on a firm bed or floor. Keeping your legs in front of you, bend your knees so they are both pointed toward the ceiling and  your feet are flat on the floor.  Tense your lower abdominal muscles to press your low back into the floor. This motion will rotate your pelvis so that your tail bone is scooping upwards rather than pointing at your feet or into the floor. With a gentle tension and even breathing, hold this position for __________ seconds. Repeat __________ times. Complete this exercise __________ times per day.  STRENGTHENING - Abdominals, Crunches   Lie on a firm bed or floor. Keeping your legs in front of you, bend your knees so they are both pointed toward the ceiling and your feet are flat on the floor. Cross your arms over your chest.  Slightly tip your chin down without bending your neck.  Tense your abdominals and  slowly lift your trunk high enough to just clear your shoulder blades. Lifting higher can put excessive stress on the lower back and does not further strengthen your abdominal muscles.  Control your return to the starting position. Repeat __________ times. Complete this exercise __________ times per day.  STRENGTHENING - Quadruped, Opposite UE/LE Lift   Assume a hands and knees position on a firm surface. Keep your hands under your shoulders and your knees under your hips. You may place padding under your knees for comfort.  Find your neutral spine and gently tense your abdominal muscles so that you can maintain this position. Your shoulders and hips should form a rectangle that is parallel with the floor and is not twisted.  Keeping your trunk steady, lift your right hand no higher than your shoulder and then your left leg no higher than your hip. Make sure you are not holding your breath. Hold this position for __________ seconds.  Continuing to keep your abdominal muscles tense and your back steady, slowly return to your starting position. Repeat with the opposite arm and leg. Repeat __________ times. Complete this exercise __________ times per day.  STRENGTHENING - Abdominals and  Quadriceps, Straight Leg Raise   Lie on a firm bed or floor with both legs extended in front of you.  Keeping one leg in contact with the floor, bend the other knee so that your foot can rest flat on the floor.  Find your neutral spine, and tense your abdominal muscles to maintain your spinal position throughout the exercise.  Slowly lift your straight leg off the floor about 6 inches for a count of 15, making sure to not hold your breath.  Still keeping your neutral spine, slowly lower your leg all the way to the floor. Repeat this exercise with each leg __________ times. Complete this exercise __________ times per day. POSTURE AND BODY MECHANICS CONSIDERATIONS - Low Back Sprain Keeping correct posture when sitting, standing or completing your activities will reduce the stress put on different body tissues, allowing injured tissues a chance to heal and limiting painful experiences. The following are general guidelines for improved posture. Your physician or physical therapist will provide you with any instructions specific to your needs. While reading these guidelines, remember:  The exercises prescribed by your provider will help you have the flexibility and strength to maintain correct postures.  The correct posture provides the best environment for your joints to work. All of your joints have less wear and tear when properly supported by a spine with good posture. This means you will experience a healthier, less painful body.  Correct posture must be practiced with all of your activities, especially prolonged sitting and standing. Correct posture is as important when doing repetitive low-stress activities (typing) as it is when doing a single heavy-load activity (lifting). RESTING POSITIONS Consider which positions are most painful for you when choosing a resting position. If you have pain with flexion-based activities (sitting, bending, stooping, squatting), choose a position that allows  you to rest in a less flexed posture. You would want to avoid curling into a fetal position on your side. If your pain worsens with extension-based activities (prolonged standing, working overhead), avoid resting in an extended position such as sleeping on your stomach. Most people will find more comfort when they rest with their spine in a more neutral position, neither too rounded nor too arched. Lying on a non-sagging bed on your side with a pillow between your knees, or on your  back with a pillow under your knees will often provide some relief. Keep in mind, being in any one position for a prolonged period of time, no matter how correct your posture, can still lead to stiffness. PROPER SITTING POSTURE In order to minimize stress and discomfort on your spine, you must sit with correct posture. Sitting with good posture should be effortless for a healthy body. Returning to good posture is a gradual process. Many people can work toward this most comfortably by using various supports until they have the flexibility and strength to maintain this posture on their own. When sitting with proper posture, your ears will fall over your shoulders and your shoulders will fall over your hips. You should use the back of the chair to support your upper back. Your lower back will be in a neutral position, just slightly arched. You may place a small pillow or folded towel at the base of your lower back for  support.  When working at a desk, create an environment that supports good, upright posture. Without extra support, muscles tire, which leads to excessive strain on joints and other tissues. Keep these recommendations in mind: CHAIR:  A chair should be able to slide under your desk when your back makes contact with the back of the chair. This allows you to work closely.  The chair's height should allow your eyes to be level with the upper part of your monitor and your hands to be slightly lower than your  elbows. BODY POSITION  Your feet should make contact with the floor. If this is not possible, use a foot rest.  Keep your ears over your shoulders. This will reduce stress on your neck and low back. INCORRECT SITTING POSTURES  If you are feeling tired and unable to assume a healthy sitting posture, do not slouch or slump. This puts excessive strain on your back tissues, causing more damage and pain. Healthier options include:  Using more support, like a lumbar pillow.  Switching tasks to something that requires you to be upright or walking.  Talking a brief walk.  Lying down to rest in a neutral-spine position. PROLONGED STANDING WHILE SLIGHTLY LEANING FORWARD  When completing a task that requires you to lean forward while standing in one place for a long time, place either foot up on a stationary 2-4 inch high object to help maintain the best posture. When both feet are on the ground, the lower back tends to lose its slight inward curve. If this curve flattens (or becomes too large), then the back and your other joints will experience too much stress, tire more quickly, and can cause pain. CORRECT STANDING POSTURES Proper standing posture should be assumed with all daily activities, even if they only take a few moments, like when brushing your teeth. As in sitting, your ears should fall over your shoulders and your shoulders should fall over your hips. You should keep a slight tension in your abdominal muscles to brace your spine. Your tailbone should point down to the ground, not behind your body, resulting in an over-extended swayback posture.  INCORRECT STANDING POSTURES  Common incorrect standing postures include a forward head, locked knees and/or an excessive swayback. WALKING Walk with an upright posture. Your ears, shoulders and hips should all line-up. PROLONGED ACTIVITY IN A FLEXED POSITION When completing a task that requires you to bend forward at your waist or lean over a low  surface, try to find a way to stabilize 3 out of 4  of your limbs. You can place a hand or elbow on your thigh or rest a knee on the surface you are reaching across. This will provide you more stability, so that your muscles do not tire as quickly. By keeping your knees relaxed, or slightly bent, you will also reduce stress across your lower back. CORRECT LIFTING TECHNIQUES DO :  Assume a wide stance. This will provide you more stability and the opportunity to get as close as possible to the object which you are lifting.  Tense your abdominals to brace your spine. Bend at the knees and hips. Keeping your back locked in a neutral-spine position, lift using your leg muscles. Lift with your legs, keeping your back straight.  Test the weight of unknown objects before attempting to lift them.  Try to keep your elbows locked down at your sides in order get the best strength from your shoulders when carrying an object.  Always ask for help when lifting heavy or awkward objects. INCORRECT LIFTING TECHNIQUES DO NOT:   Lock your knees when lifting, even if it is a small object.  Bend and twist. Pivot at your feet or move your feet when needing to change directions.  Assume that you can safely pick up even a paperclip without proper posture. Document Released: 10/06/2005 Document Revised: 12/29/2011 Document Reviewed: 01/18/2009 [facility] Patient Information 2015 [place], [place]. This information is not intended to replace advice given to you by your health care provider. Make sure you discuss any questions you have with your health care provider.

## 2014-08-14 NOTE — Progress Notes (Signed)
   Subjective:    Patient ID: Shannon [name], female    DOB: 1966-04-04, 48 y.o.   MRN: [id number]  HPI Pt presents to the clinic very concerned with bilateral toes numbness and tingling. Has been going on and off for that couple of months but until the last week seems to be fairly constant and noticed more swelling of feet as well. Socks seem to make worse. Will resolve spontanously. Located over middle 3 toes on both feet but worse on left. On left side numbness and tingling pain seems to start around her thigh and go into her middle 3 toes.  She has not tried anything to make better. 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 She has been seen by [facility]. She has even been to PT but not for these symptoms. PT did help. She admits that her low back pain has worsened some as well. She is concerned about her borderline sugar level.   Mood disorder- recently had reaction to latuda with sever aggitation. Office did not call her back for days. She would like to switch providers.   She is also very concerned because she is still very fatigued despite CPAP therapy. She continues to gain weight. Could not tolerate belviq because increased her appetitie and contrave made her more sleepy.       Review of Systems  All other systems reviewed and are negative.      Objective:   Physical Exam  Constitutional: She is oriented to person, place, and time. She appears well-developed and well-nourished.  obesity  HENT:  Head: Normocephalic and atraumatic.  Cardiovascular: Normal rate, regular rhythm and normal heart sounds.   Pulmonary/Chest: Effort normal and breath sounds normal. She has no wheezes.  Musculoskeletal:  Bilateral feet sensation intact today.  Bilateral feet strength 5/5.  No swelling or brusing.  Strength of lower legs 5/5 bilaterally.   Neurological: She is alert and oriented to person, place, and time.  Skin: Skin is dry.  Psychiatric: She has a normal mood and affect. Her  behavior is normal.          Assessment & Plan:  Bilateral numbness and tingling of feet/ddd lumbar spine/periphal edema- ordered lumbar spine xrays. i feel like pt has some lumbar radiculopathy going on. Treated with prednisone burst, mobic daily for next 2 weeks and given exercises. Follow up in 2 weeks or follow up with [facility] where you were previously treated.   DM- A1C was 6.5. Dx of DM. Pt very emotional and cried. Discussed I do not think sugars are what are causing feet numbness and tingling. Started on metformin bid. Discussed progression of disease. Discussed diet. Follow up in 3 months. Will check cmp and lipid.   Fatigue/abnormal weight gain- b12, vitamin D and thyroid will check.   Mood disorder- having problems with office calling her back. Would like new referal. Currently only on lamictal.will make referral for downstairs.

## 2014-08-15 ENCOUNTER — Other Ambulatory Visit: Payer: Self-pay | Admitting: Physician Assistant

## 2014-08-15 ENCOUNTER — Encounter: Payer: Self-pay | Admitting: Physician Assistant

## 2014-08-15 DIAGNOSIS — E119 Type 2 diabetes mellitus without complications: Secondary | ICD-10-CM

## 2014-08-15 DIAGNOSIS — E1121 Type 2 diabetes mellitus with diabetic nephropathy: Secondary | ICD-10-CM | POA: Insufficient documentation

## 2014-08-15 DIAGNOSIS — E039 Hypothyroidism, unspecified: Secondary | ICD-10-CM | POA: Insufficient documentation

## 2014-08-15 HISTORY — DX: Type 2 diabetes mellitus without complications: E11.9

## 2014-08-15 LAB — COMPLETE METABOLIC PANEL WITH GFR
ALK PHOS: 80 U/L (ref 39–117)
ALT: 29 U/L (ref 0–35)
AST: 31 U/L (ref 0–37)
Albumin: 4.2 g/dL (ref 3.5–5.2)
BILIRUBIN TOTAL: 0.4 mg/dL (ref 0.2–1.2)
BUN: 8 mg/dL (ref 6–23)
CO2: 23 mEq/L (ref 19–32)
CREATININE: 0.57 mg/dL (ref 0.50–1.10)
Calcium: 8.9 mg/dL (ref 8.4–10.5)
Chloride: 103 mEq/L (ref 96–112)
GFR, Est African American: >89 mL/min
GLUCOSE: 111 mg/dL — AB (ref 70–99)
Potassium: 4.2 mEq/L (ref 3.5–5.3)
SODIUM: 138 meq/L (ref 135–145)
TOTAL PROTEIN: 6.8 g/dL (ref 6.0–8.3)

## 2014-08-15 LAB — LIPID PANEL
Cholesterol: 228 mg/dL — ABNORMAL HIGH (ref 0–200)
HDL: 37 mg/dL — ABNORMAL LOW (ref >39)
LDL Cholesterol: 125 mg/dL — ABNORMAL HIGH (ref 0–99)
Total CHOL/HDL Ratio: 6.2 Ratio
Triglycerides: 330 mg/dL — ABNORMAL HIGH (ref <150)
VLDL: 66 mg/dL — AB (ref 0–40)

## 2014-08-15 LAB — T4, FREE: Free T4: 0.76 ng/dL — ABNORMAL LOW (ref 0.80–1.80)

## 2014-08-15 LAB — TSH: TSH: 4.366 u[IU]/mL (ref 0.350–4.500)

## 2014-08-15 LAB — VITAMIN D 25 HYDROXY (VIT D DEFICIENCY, FRACTURES): Vit D, 25-Hydroxy: 39 ng/mL (ref 30–89)

## 2014-08-15 LAB — VITAMIN B12: VITAMIN B 12: 705 pg/mL (ref 211–911)

## 2014-08-15 MED ORDER — SIMVASTATIN 20 MG PO TABS: 20.0000 mg | ORAL_TABLET | Freq: Every day | ORAL | Status: DC

## 2014-08-15 MED ORDER — LEVOTHYROXINE SODIUM 25 MCG PO TABS: 25.0000 ug | ORAL_TABLET | Freq: Every day | ORAL | Status: DC

## 2014-08-16 ENCOUNTER — Other Ambulatory Visit: Payer: Self-pay | Admitting: *Deleted

## 2014-08-16 MED ORDER — LEVOTHYROXINE SODIUM 25 MCG PO TABS: 25.0000 ug | ORAL_TABLET | Freq: Every day | ORAL | Status: DC

## 2014-08-16 MED ORDER — SIMVASTATIN 20 MG PO TABS
20.0000 mg | ORAL_TABLET | Freq: Every day | ORAL | Status: DC
Start: 2014-08-16 — End: 2015-01-04

## 2014-08-21 ENCOUNTER — Encounter: Payer: Self-pay | Admitting: Physician Assistant

## 2014-08-28 ENCOUNTER — Encounter: Payer: Self-pay | Admitting: Physician Assistant

## 2014-08-28 ENCOUNTER — Ambulatory Visit (INDEPENDENT_AMBULATORY_CARE_PROVIDER_SITE_OTHER): Payer: 59 | Admitting: Physician Assistant

## 2014-08-28 ENCOUNTER — Ambulatory Visit (INDEPENDENT_AMBULATORY_CARE_PROVIDER_SITE_OTHER): Payer: 59

## 2014-08-28 VITALS — BP 124/73 | HR 79 | Ht 65.0 in | Wt 235.0 lb

## 2014-08-28 DIAGNOSIS — R202 Paresthesia of skin: Secondary | ICD-10-CM

## 2014-08-28 DIAGNOSIS — R208 Other disturbances of skin sensation: Secondary | ICD-10-CM | POA: Insufficient documentation

## 2014-08-28 DIAGNOSIS — F39 Unspecified mood [affective] disorder: Secondary | ICD-10-CM

## 2014-08-28 DIAGNOSIS — M5136 Other intervertebral disc degeneration, lumbar region: Secondary | ICD-10-CM

## 2014-08-28 DIAGNOSIS — R200 Anesthesia of skin: Secondary | ICD-10-CM

## 2014-08-28 DIAGNOSIS — E669 Obesity, unspecified: Secondary | ICD-10-CM

## 2014-08-28 NOTE — Patient Instructions (Signed)
Will get scheduled for ABI.

## 2014-08-28 NOTE — Progress Notes (Signed)
   Subjective:    Patient ID: Shannon [name], female    DOB: 1966-06-06, 48 y.o.   MRN: [id number]  HPI  Pt presents to the clinic for 2 week follow up from bilateral numbness and tingling in lower extremities and feet. Left being worse than right. Hx of lumbar back problems and scaitca. Treated with prednisone, mobic, flexeril and exercise at last visit. No improvement but not worsening. No new symptoms. No over back pain other than her normal lumbar tenderness and on and off pain. Numbness and tingling worse in extremities Worse when she sits down or walks for longer timeframe. Did not get xrays done at last visit. Admits to not doing exercises. Taking mobic and does seem to help some.   Elevated blood pressure- pt does feel more calm. No CP, palpitations, headaches, or vision changes.        Review of Systems  All other systems reviewed and are negative.      Objective:   Physical Exam  Constitutional: She is oriented to person, place, and time. She appears well-developed and well-nourished.  obesity  HENT:  Head: Normocephalic and atraumatic.  Cardiovascular: Normal rate, regular rhythm and normal heart sounds.   Pulmonary/Chest: Effort normal and breath sounds normal.  Musculoskeletal:  Continues to have numbness and tingling of left lateral foot and of every toe except great toe and right medial foot including great toe.   No pain or swelling over calves. She does report on and off numbness and pain after sitting or walking for long timeframes.   Neurological: She is alert and oriented to person, place, and time.  Skin: Skin is dry.  Psychiatric: She has a normal mood and affect. Her behavior is normal.          Assessment & Plan:  Bilateral lower extremity numbness/tingling/feet paresthesia bilateral- will get lumbar xrays today. Discussed certainly sounds like some lumbar radiculopathy. Referred to Dr. Darene Lamer for evaluation. Will get ABI. Continue mobic. Discussed importance of  trying home exercises. Suggested formal PT but pt would like evaluation first and opportunity to try some exercises at home.   Xray- mild DDD, no acute changes.   Elevated blood pressure- looks great today.   Mood disorder- not been called downstairs. Will follow up and see what is going on. Referral placed 10/27.  Obesity- pt has appt with weight loss clinic on November 19th. Not able to tolerate medications that we have tried. belviq and contrave.

## 2014-08-30 ENCOUNTER — Ambulatory Visit (INDEPENDENT_AMBULATORY_CARE_PROVIDER_SITE_OTHER): Payer: 59 | Admitting: Sports Medicine

## 2014-08-30 ENCOUNTER — Telehealth: Payer: Self-pay | Admitting: *Deleted

## 2014-08-30 ENCOUNTER — Encounter: Payer: Self-pay | Admitting: Sports Medicine

## 2014-08-30 VITALS — BP 126/68 | HR 87 | Ht 65.5 in | Wt 239.0 lb

## 2014-08-30 DIAGNOSIS — M51369 Other intervertebral disc degeneration, lumbar region without mention of lumbar back pain or lower extremity pain: Secondary | ICD-10-CM

## 2014-08-30 DIAGNOSIS — M5136 Other intervertebral disc degeneration, lumbar region: Secondary | ICD-10-CM

## 2014-08-30 MED ORDER — PREDNISONE 50 MG PO TABS: ORAL_TABLET | ORAL | Status: DC

## 2014-08-30 NOTE — Progress Notes (Signed)
   Subjective:    I'm seeing this patient as a consultation for:  Iran Planas, PA-C  CC: back pain, foot numbness  HPI: This is an extremely pleasant 48 year old female, for 5 years now she's had problems with her back, pain with sitting, flexion, Valsalva. More recently she developed bilateral numbness in both feet, left worse than right. Moderate, persistent, no bowel or bladder dysfunction or saddle numbness. She has tried chiropractic care which was moderately effective however temporary and expensive. She had an MRI of the distant past that did show a disc protrusion however this was almost 20 years ago.  Past medical history, Surgical history, Family history not pertinant except as noted below, Social history, Allergies, and medications have been entered into the medical record, reviewed, and no changes needed.   Review of Systems: No headache, visual changes, nausea, vomiting, diarrhea, constipation, dizziness, abdominal pain, skin rash, fevers, chills, night sweats, weight loss, swollen lymph nodes, body aches, joint swelling, muscle aches, chest pain, shortness of breath, mood changes, visual or auditory hallucinations.   Objective:   General: Well Developed, well nourished, and in no acute distress.  Neuro/Psych: Alert and oriented x3, extra-ocular muscles intact, able to move all 4 extremities, sensation grossly intact. Skin: Warm and dry, no rashes noted.  Respiratory: Not using accessory muscles, speaking in full sentences, trachea midline.  Cardiovascular: Pulses palpable, no extremity edema. Abdomen: Does not appear distended. Back Exam:  Inspection: Unremarkable  Motion: Flexion 45 deg, Extension 45 deg, Side Bending to 45 deg bilaterally,  Rotation to 45 deg bilaterally  SLR laying: positive XSLR laying: Negative  Palpable tenderness: None. FABER: negative. Sensory change: Gross sensation intact to all lumbar and sacral dermatomes.  Reflexes: 2+ at both patellar  tendons, 2+ at achilles tendons, Babinski's downgoing.  Strength at foot  Plantar-flexion: 5/5 Dorsi-flexion: 5/5 Eversion: 5/5 Inversion: 5/5  Leg strength  Quad: 5/5 Hamstring: 5/5 Hip flexor: 5/5 Hip abductors: 5/5  Gait unremarkable.  Impression and Recommendations:   This case required medical decision making of moderate complexity.

## 2014-08-30 NOTE — Telephone Encounter (Signed)
No prior auth required as long as performed at [facility] facility. Margette Fast, CMA

## 2014-08-30 NOTE — Assessment & Plan Note (Signed)
With bilateral left worse than right L5 radiculitis. She does okay with chiropractic care, relief is temporary. She is looking for longer-term solution. At this point as she is in significant pain we are going to do a prednisone burst. MRI for interventional injection planning which will likely take the form of a bilateral L5-S1 transforaminal epidural.  Return to go over MRI results. Certainly we can further investigate diabetic peripheral neuropathy if the MRI is negative.

## 2014-08-31 ENCOUNTER — Encounter (HOSPITAL_COMMUNITY): Payer: Self-pay | Admitting: Psychiatry

## 2014-08-31 ENCOUNTER — Ambulatory Visit (INDEPENDENT_AMBULATORY_CARE_PROVIDER_SITE_OTHER): Payer: 59 | Admitting: Psychiatry

## 2014-08-31 VITALS — BP 140/90 | HR 90 | Ht 65.5 in | Wt 239.0 lb

## 2014-08-31 DIAGNOSIS — F331 Major depressive disorder, recurrent, moderate: Secondary | ICD-10-CM

## 2014-08-31 DIAGNOSIS — F410 Panic disorder [episodic paroxysmal anxiety] without agoraphobia: Secondary | ICD-10-CM | POA: Diagnosis not present

## 2014-08-31 DIAGNOSIS — F0630 Mood disorder due to known physiological condition, unspecified: Secondary | ICD-10-CM | POA: Diagnosis not present

## 2014-08-31 MED ORDER — LAMOTRIGINE 150 MG PO TABS: 150.0000 mg | ORAL_TABLET | Freq: Every day | ORAL | Status: DC

## 2014-08-31 MED ORDER — CITALOPRAM HYDROBROMIDE 20 MG PO TABS: ORAL_TABLET | ORAL | Status: DC

## 2014-08-31 NOTE — Progress Notes (Signed)
Patient ID: Shannon [name], female   DOB: May 04, 1966, 48 y.o.   MRN: [id number]  [facility] Initial Psychiatric Assessment   LY WASS [id number] 48 y.o.  08/31/2014 9:30 AM  Chief Complaint:  Depression and feeling edgy  History of Present Illness:   Patient Presents for Initial Evaluation with symptoms of depression. She is followed with Dr. Letta Moynahan psychiatrist office for many years. She has been on Lamictal and Zoloft. Recently her Zoloft was discontinued and she was started on that to [place]. Apparently she felt more agitation on the latuda so she called their office for a change. She was not getting a quick responses so she wanted to change provider. Recent aggravating factors included that of her cousin who she was closely attached to. She also has been on steroids. Says medications were changed but apparently it made her more agitated. She wants to get back on Lamictal and some other SSRI.  Her other factors include difficult marriage that started 20 years ago leading to divorced. Tended to take care of her son.  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  Associated symptoms include in the past depressive episode including disturbed sleep, energy, appetite feeling of hopelessness despair. Does not endorse hopelessness to the point of suicidal or homicidal thoughts but has crying spells and anhedonia. She withdraws and does not future she's going to get better when she is in a depressed mood  She has been diagnosed with mood disorder there is no clear history of bipolar but she does get irritable or edgy for some time but she feels it is out of frustration and depression  This family history depression in the family  She also endorses having panic like symptoms in 2013 she had to go to the ED because of chest pain palpitations and she was diagnosed with panic disorder. She does not have frequent panic attacks and with the  current medication regimen she is not too much worried about having another panic attack she does takes Xanax when necessary a small dose of 0.25 mg.  No associated psychotic symptoms delusions, hallucinations. No history of physical or sexual trauma when she was growing up she had difficult relationship with her husband and then later on with her boyfriend but not to the point of having flashbacks or nightmares about it.   Past Psychiatric History/Hospitalization(s) Manged for depression and mood symptoms for more then 20 years.  Hospitalization for psychiatric illness: No History of Electroconvulsive Shock Therapy: No Prior Suicide Attempts: No  Medical History; Past Medical History  Diagnosis Date  . Hyperlipidemia   . Sleep apnea   . Anxiety   . Depression     Allergies: Allergies  Allergen Reactions  . Belviq [Lorcaserin Hcl]     Increased appetitie  . Contrave [Naltrexone-Bupropion Hcl Er]     Sleepy.  Anette Guarneri [Lurasidone Hcl]     Severe aggitation    Medications: Outpatient Encounter Prescriptions as of 08/31/2014  Medication Sig  . ALPRAZolam (XANAX) 0.25 MG tablet Take 0.25 mg by mouth as needed.  . citalopram (CELEXA) 20 MG tablet Take half for first 4 days then change to 1 tablet a day.  . lamoTRIgine (LAMICTAL) 150 MG tablet Take 1 tablet (150 mg total) by mouth daily.  Marland Kitchen levothyroxine (SYNTHROID, LEVOTHROID) 25 MCG tablet Take 1 tablet (25 mcg total) by mouth daily before breakfast.  . meloxicam (MOBIC) 15 MG tablet Take 1 tablet (  15 mg total) by mouth daily.  . metFORMIN (GLUCOPHAGE) 500 MG tablet Take 1 tablet (500 mg total) by mouth 2 (two) times daily with a meal.  . predniSONE (DELTASONE) 50 MG tablet One tab PO daily for 5 days.  . simvastatin (ZOCOR) 20 MG tablet Take 1 tablet (20 mg total) by mouth at bedtime.  . [DISCONTINUED] lamoTRIgine (LAMICTAL) 150 MG tablet Take 150 mg by mouth daily.     Substance Abuse History: Denies using illicit  drugs, alcohol or other substances.  Family History; Family History  Problem Relation Age of Onset  . Hyperlipidemia Mother   . Sleep apnea Mother   . Depression Mother   . Diabetes Father   . Hypertension Father   . COPD Father   . Diabetes Paternal Aunt   . Diabetes Paternal Uncle   . Alcohol abuse Paternal Uncle   . Alcohol abuse Paternal Grandfather   . Depression Sister       Biopsychosocial History:  She grew up with her parents. Growing up was good reasonable. There is no physical or sexual trauma. She has a family history of depression her mom also attempted suicide her sister also attempted suicide.  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 she has 1 son whom she takes care of    Labs:  Recent Results (from the past 2160 hour(s))  POCT HgB A1C     Status: None   Collection Time: 08/14/14  8:44 AM  Result Value Ref Range   Hemoglobin A1C 6.5   TSH     Status: None   Collection Time: 08/14/14  9:23 AM  Result Value Ref Range   TSH 4.366 0.350 - 4.500 uIU/mL  T4, free     Status: Abnormal   Collection Time: 08/14/14  9:23 AM  Result Value Ref Range   Free T4 0.76 (L) 0.80 - 1.80 ng/dL  COMPLETE METABOLIC PANEL WITH GFR     Status: Abnormal   Collection Time: 08/14/14  9:23 AM  Result Value Ref Range   Sodium 138 135 - 145 mEq/L   Potassium 4.2 3.5 - 5.3 mEq/L   Chloride 103 96 - 112 mEq/L   CO2 23 19 - 32 mEq/L   Glucose, Bld 111 (H) 70 - 99 mg/dL   BUN 8 6 - 23 mg/dL   Creat 0.57 0.50 - 1.10 mg/dL   Total Bilirubin 0.4 0.2 - 1.2 mg/dL   Alkaline Phosphatase 80 39 - 117 U/L   AST 31 0 - 37 U/L   ALT 29 0 - 35 U/L   Total Protein 6.8 6.0 - 8.3 g/dL   Albumin 4.2 3.5 - 5.2 g/dL   Calcium 8.9 8.4 - 10.5 mg/dL   GFR, Est African American >89 mL/min   GFR, Est Non African American >89 mL/min    Comment:   The estimated GFR is a calculation valid for adults (>=65 years old) that uses the  CKD-EPI algorithm to adjust for age and sex. It is   not to be used for children, pregnant women, hospitalized patients,    patients on dialysis, or with rapidly changing kidney function. According to the NKDEP, eGFR >89 is normal, 60-89 shows mild impairment, 30-59 shows moderate impairment, 15-29 shows severe impairment and <15 is ESRD.    B12     Status: None   Collection Time: 08/14/14  9:23 AM  Result Value Ref Range  Vitamin B-12 705 211 - 911 pg/mL  Vitamin D (25 hydroxy)     Status: None   Collection Time: 08/14/14  9:23 AM  Result Value Ref Range   Vit D, 25-Hydroxy 39 30 - 89 ng/mL    Comment: This assay accurately quantifies Vitamin D, which is the sum of the 25-Hydroxy forms of Vitamin D2 and D3.  Studies have shown that the optimum concentration of 25-Hydroxy Vitamin D is 30 ng/mL or higher.  Concentrations of Vitamin D between 20 and 29 ng/mL are considered to be insufficient and concentrations less than 20 ng/mL are considered to be deficient for Vitamin D.  Lipid panel     Status: Abnormal   Collection Time: 08/14/14  9:23 AM  Result Value Ref Range   Cholesterol 228 (H) 0 - 200 mg/dL    Comment: ATP III Classification:       < 200        mg/dL        Desirable      200 - 239     mg/dL        Borderline High      >= 240        mg/dL        High     Triglycerides 330 (H) <150 mg/dL   HDL 37 (L) >39 mg/dL   Total CHOL/HDL Ratio 6.2 Ratio   VLDL 66 (H) 0 - 40 mg/dL   LDL Cholesterol 125 (H) 0 - 99 mg/dL    Comment:   Total Cholesterol/HDL Ratio:CHD Risk                        Coronary Heart Disease Risk Table                                        Men       Women          1/2 Average Risk              3.4        3.3              Average Risk              5.0        4.4           2X Average Risk              9.6        7.1           3X Average Risk             23.4       11.0 Use the calculated Patient Ratio above and the CHD Risk table  to determine the  patient's CHD Risk. ATP III Classification (LDL):       < 100        mg/dL         Optimal      100 - 129     mg/dL         Near or Above Optimal      130 - 159     mg/dL         Borderline High      160 - 189     mg/dL  High       > 190        mg/dL         Very High         Musculoskeletal: Strength & Muscle Tone: within normal limits Gait & Station: normal Patient leans: N/A  Mental Status Examination;   Psychiatric Specialty Exam: Physical Exam  Constitutional: She appears well-developed and well-nourished. No distress.  Skin: She is not diaphoretic.    Review of Systems  Constitutional: Negative.   Gastrointestinal: Negative for nausea.  Skin: Negative for rash.  Neurological: Negative for tremors and headaches.  Psychiatric/Behavioral: Positive for depression. Negative for suicidal ideas, hallucinations and substance abuse. The patient is nervous/anxious. The patient does not have insomnia.     Blood pressure 140/90, pulse 90, height 5' 5.5" (1.664 m), weight 239 lb (108.41 kg), last menstrual period 08/18/2014.Body mass index is 39.15 kg/(m^2).  General Appearance: Casual  Eye Contact::  Fair  Speech:  Normal Rate  Volume:  Normal  Mood:  Dysphoric  Affect:  Congruent  Thought Process:  Coherent  Orientation:  Full (Time, Place, and Person)  Thought Content:  Rumination  Suicidal Thoughts:  No  Homicidal Thoughts:  No  Memory:  Immediate;   Fair Recent;   Fair  Judgement:  Fair  Insight:  Shallow  Psychomotor Activity:  Normal  Concentration:  Fair  Recall:  Fair  Akathisia:  Negative  Handed:  Right  AIMS (if indicated):     Assets:  Communication Skills Desire for Improvement Financial Resources/Insurance Housing  Sleep:        Assessment: Axis I: mood disorder unspecified. Rule out mood disorder secondary to general medical condition. Major depressive disorder recurrent moderate. Rule out panic disorder  Axis II: deferred  Axis III:   Past Medical History  Diagnosis Date  . Hyperlipidemia   . Sleep apnea   . Anxiety   . Depression     Axis IV: psychosocial   Treatment Plan and Summary: Discontinue it today she is not taking because she felt agitated on it. Continue Lamictal 150 mg refill given. Start her on Celexa 10 mg increasing to 20 mg the next for 5 days. I would recommend therapy because of psychosocial issues including that of her cousin. She is currently on steroids that may be also causing some imbalance in her moods. Follow primary care providers regarding her hypothyroidism that may also contribute to depression. Pertinent Labs and Relevant Prior Notes reviewed. Medication Side effects, benefits and risks reviewed/discussed with Patient. Time given for patient to respond and asks questions regarding the Diagnosis and Medications. Safety concerns and to report to ER if suicidal or call 911. Relevant Medications refilled or called in to pharmacy. Discussed weight maintenance and Sleep Hygiene. Follow up with Primary care provider in regards to Medical conditions. Recommend compliance with medications and follow up office appointments. Discussed to avail opportunity to consider or/and continue Individual therapy with Counselor. Greater than 50% of time was spend in counseling and coordination of care with the patient.  Schedule for Follow up visit in 3 weeks or call in earlier as necessary.   Merian Capron, MD 08/31/2014

## 2014-09-04 ENCOUNTER — Ambulatory Visit (INDEPENDENT_AMBULATORY_CARE_PROVIDER_SITE_OTHER): Payer: 59

## 2014-09-04 DIAGNOSIS — M2578 Osteophyte, vertebrae: Secondary | ICD-10-CM

## 2014-09-04 DIAGNOSIS — M5136 Other intervertebral disc degeneration, lumbar region: Secondary | ICD-10-CM

## 2014-09-04 DIAGNOSIS — M5137 Other intervertebral disc degeneration, lumbosacral region: Secondary | ICD-10-CM

## 2014-09-05 ENCOUNTER — Other Ambulatory Visit: Payer: Self-pay | Admitting: Physician Assistant

## 2014-09-05 DIAGNOSIS — F39 Unspecified mood [affective] disorder: Secondary | ICD-10-CM

## 2014-09-06 ENCOUNTER — Ambulatory Visit (INDEPENDENT_AMBULATORY_CARE_PROVIDER_SITE_OTHER): Payer: 59 | Admitting: Sports Medicine

## 2014-09-06 ENCOUNTER — Encounter: Payer: Self-pay | Admitting: Sports Medicine

## 2014-09-06 DIAGNOSIS — M51369 Other intervertebral disc degeneration, lumbar region without mention of lumbar back pain or lower extremity pain: Secondary | ICD-10-CM

## 2014-09-06 DIAGNOSIS — M5136 Other intervertebral disc degeneration, lumbar region: Secondary | ICD-10-CM

## 2014-09-06 NOTE — Assessment & Plan Note (Signed)
MRI does confirm an L5-S1 disc protrusion, midline, it is possible that this protrusion can affect both nerve roots, L5 and S1.  Symptoms are predominantly bilateral L5, left worse than right. At this point we are going to proceed with a left-sided L5-S1 interlaminar epidural injection.  Return to see me 2-3 weeks after the injection to evaluate response.

## 2014-09-06 NOTE — Progress Notes (Signed)
  Subjective:    CC: MRI results  HPI: This is a pleasant 48 year old female who returns for follow-up of a lumbar spine MRI, she has left worse than right lumbar radiculitis, L5 distribution with bilateral foot numbness. Symptoms are moderate, persistent, she has failed conservative measures. No bowel or bladder dysfunction, saddle numbness, or constitutional symptoms.  Past medical history, Surgical history, Family history not pertinant except as noted below, Social history, Allergies, and medications have been entered into the medical record, reviewed, and no changes needed.   Review of Systems: No fevers, chills, night sweats, weight loss, chest pain, or shortness of breath.   Objective:    General: Well Developed, well nourished, and in no acute distress.  Neuro: Alert and oriented x3, extra-ocular muscles intact, sensation grossly intact.  HEENT: Normocephalic, atraumatic, pupils equal round reactive to light, neck supple, no masses, no lymphadenopathy, thyroid nonpalpable.  Skin: Warm and dry, no rashes. Cardiac: Regular rate and rhythm, no murmurs rubs or gallops, no lower extremity edema.  Respiratory: Clear to auscultation bilaterally. Not using accessory muscles, speaking in full sentences. Back Exam:  Inspection: Unremarkable  Motion: Flexion 45 deg, Extension 45 deg, Side Bending to 45 deg bilaterally,  Rotation to 45 deg bilaterally  SLR laying: Negative  XSLR laying: Negative  Palpable tenderness: None. FABER: negative. Sensory change: Gross sensation intact to all lumbar and sacral dermatomes.  Reflexes: 2+ at both patellar tendons, 2+ at achilles tendons, Babinski's downgoing.  Strength at foot  Plantar-flexion: 5/5 Dorsi-flexion: 5/5 Eversion: 5/5 Inversion: 5/5  Leg strength  Quad: 5/5 Hamstring: 5/5 Hip flexor: 5/5 Hip abductors: 5/5  Gait unremarkable.  Lumbar spine MRI shows a midline L5-S1 disc protrusion that does appear to indent the thecal sac  slightly.  Impression and Recommendations:

## 2014-09-11 ENCOUNTER — Ambulatory Visit
Admission: RE | Admit: 2014-09-11 | Discharge: 2014-09-11 | Disposition: A | Discharge status: Home / Self Care | Payer: 59 | Source: Ambulatory Visit | Attending: Sports Medicine | Admitting: Sports Medicine

## 2014-09-11 MED ORDER — IOHEXOL 180 MG/ML  SOLN
1.0000 mL | Freq: Once | INTRAMUSCULAR | Status: AC | PRN
  Administered 2014-09-11: 1 mL via EPIDURAL

## 2014-09-11 MED ORDER — METHYLPREDNISOLONE ACETATE 40 MG/ML INJ SUSP (RADIOLOG
120.0000 mg | Freq: Once | INTRAMUSCULAR | Status: AC
  Administered 2014-09-11: 120 mg via EPIDURAL

## 2014-09-11 NOTE — Discharge Instructions (Signed)

## 2014-09-18 ENCOUNTER — Encounter (HOSPITAL_COMMUNITY): Payer: Self-pay | Admitting: Licensed Clinical Social Worker

## 2014-09-18 ENCOUNTER — Ambulatory Visit (INDEPENDENT_AMBULATORY_CARE_PROVIDER_SITE_OTHER): Payer: 59 | Admitting: Licensed Clinical Social Worker

## 2014-09-18 DIAGNOSIS — F411 Generalized anxiety disorder: Secondary | ICD-10-CM

## 2014-09-18 DIAGNOSIS — F341 Dysthymic disorder: Secondary | ICD-10-CM

## 2014-09-18 NOTE — Progress Notes (Signed)
Shannon Scheuermann, LCSW 09/18/2014 Patient:   Shannon [name]   DOB:   07/27/1966  MR Number:  [id number]  Location:  [facility] [address] [place] [address] Dept: [phone]           Date of Service:   09/18/14  Start Time:   9:00am End Time:   10:00am  Provider/Observer:  [facility]       Billing Code/Service: [phone]  Comprehensive Clinical Assessment  Information for assessment provided by: patient   Chief Complaint:    Depression and anxiety     Presenting Problem/Symptoms:  "I'm having a lot of anxiety and depression.Marland Kitchenit's mainly the anxiety.  My heart starts beating really fast.  I've been really antsy.  Just not feeling myself.  It can come on at any time."    Last Wednesday she was feeling overwhelmed and started to have panic symptoms...tightness in her chest, crying spells.   Recent life stressors include the loss of her cousin to cardiac arrest in May (they had been very close), her father had to go to the hospital for his COPD last week (he's home now), and she has had her own health issues: diabetes, hypothyroidism, and had to get an injection in her back (has a bulging disc against a nerve).     Last significant panic attack was "last summer."  She went to the hospital for treatment.  Behavioral Observation: Shannon [name]  presents as a 48 y.o.-year-old  Caucasian Female who appeared her stated age. her dress was Appropriate and she was Casual and her manners were Appropriate to the situation.  There were not any physical disabilities noted.  she displayed an appropriate level of cooperation and motivation.       Mental Health Symptoms:  Depression:  Some tearfulness, insomnia, irritability, fatigue, some hopelessness, worthlessness, difficulty concentrating  Describe severity and duration: reports that these symptoms have decreased within the  past month, estimates she has been feeling depressed for "twenty years"  Mania/hypomania: increased energy, irritability, euphoria, overconfidence, change in energy/activity, flight of ideas  Describe severity and duration: reports the longest she has had these symptoms at the same time is one day  Anxiety: restlessness, fatigue, irritability, tension, worrying, difficulty concentrating, sleep disturbance  Describe severity and duration:  Has been experiencing these symptoms for "a long time", reports symptoms became apparent after her mom tried to kill herself, [name] was 14    Psychosis: na    Trauma: na    Obsessions: na Describe severity and duration:  Compulsions: na     Inattention: disorganized, easily distracted, does not seem to listen, poor follow through on some tasks, forgetful, loses things, avoids/dislikes activities that require focus, symptoms before age 53, symptoms present in 2 or more settings  Reports "I usually I have to do things a couple times, can't focus enough to read a book do housecleaning, tends to procrastinate  Hyperactivity/Impulsivity: always on the go, feelings of restlessness    Oppositional/Defiant Behaviors: na    Borderline Personality: na    Mental Status  Interactions:    Active   Attention:   Good  Memory:   Intact  Speech:   Normal  Flow of Thought:  Normal  Thought Content:  WNL  Orientation:   person, place and time/date  Judgment:   Good  Affect/Mood:   Anxious  Insight:  Good  Intelligence:   normal      Medical History:    Past Medical History  Diagnosis Date  . Hyperlipidemia   . Sleep apnea   . Anxiety   . Depression      Current medications:         Outpatient Encounter Prescriptions as of 09/18/2014  Medication Sig  . ALPRAZolam (XANAX) 0.5 MG tablet   . citalopram (CELEXA) 20 MG tablet Take half for first 4 days then change to 1 tablet a day.  . lamoTRIgine (LAMICTAL) 150 MG  tablet Take 1 tablet (150 mg total) by mouth daily.  Marland Kitchen levothyroxine (SYNTHROID, LEVOTHROID) 25 MCG tablet Take 1 tablet (25 mcg total) by mouth daily before breakfast.  . meloxicam (MOBIC) 15 MG tablet Take 1 tablet (15 mg total) by mouth daily.  . metFORMIN (GLUCOPHAGE) 500 MG tablet Take 1 tablet (500 mg total) by mouth 2 (two) times daily with a meal.  . simvastatin (ZOCOR) 20 MG tablet Take 1 tablet (20 mg total) by mouth at bedtime.  . ALPRAZolam (XANAX) 0.25 MG tablet Take 0.25 mg by mouth as needed.  . lamoTRIgine (LAMICTAL) 200 MG tablet               Mental Health/Substance Use Treatment History:    Type of Treatment: counseling Facility: Dr Solmon Ice When: on and off in 2008 Treated for: marital problems  Was it beneficial? To some extent       Family Med/Psych History:  Family History  Problem Relation Age of Onset  . Hyperlipidemia Mother   . Sleep apnea Mother   . Depression Mother   . Diabetes Father   . Hypertension Father   . COPD Father   . Diabetes Paternal Aunt   . Diabetes Paternal Uncle   . Alcohol abuse Paternal Uncle   . Alcohol abuse Paternal Grandfather   . Depression Sister     Risk of Suicide/Violence:  Self-Harm Potential: Thoughts of Self-Harm: vague thoughts in the past month Method: no plan Availability of means: na Is there a family history of suicide? Mom and sister have attempted Previous attempts? no Preoccupation with death? no History of acts of self-harm? no   Dangerousness to Others Potential:  Method: na Availability of means: na Intent: na Family history of violence? no Active psychosis? no Previous attempts? no       Substance Use History:  Denies current substance use  Alcohol: In early 20s drank regularly on the weekends    Marital Status: Divorced from husband March 2009.  They had been married for 13 years.  "He pretty much chose the internet over me and our son, Rolla Plate."    Lives with: son, Rolla Plate  (13)  Family Relationships:   Very close with her son.    She reports he is having a hard time dealing with the fact that his dad left and moved to [place] about a year ago.  Spent two months over the summer with his dad.  Reports "He is very protective of me."      Parents are married and live in [place], [place].  Close with both.   Has an older sister.  Lives in [place].  "We're not that close."  Sister has depression and fibromyalgia.  Hospitalized for [place] for a year at one point.      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  Likes her job.  Past Employment:  Psychologist, counselling for 18 years  Education:   Secretary/administrator    Religion/Spirituality:  Surveyor, minerals to a [employer], not currently attending church, would like to find a place to go  Hobbies:  Take walks, do crafts, go to [employer] or the [employer]  Strengths/Protective Factors: great smile and accent, has some friends  Legal History:  none  Military Involvement: none   Impression/DX:  Rolla meets criteria for Generalized Anxiety Disorder.  She indicated she worries excessively and finds it difficult to control the worries.  Worries are not specific, primarily related to life stressors.  She endorses restlessness, being easily fatigued, difficulty concentrating, muscle tension, irritability, and sleep disturbance.    She also meets criteria for Persistent Depressive Disorder.  She indicated that she has had depressive symptoms for many years.  Symptoms became apparent in the later years of her marriage.  The PHQ-9 administered today indicates her symptoms are mild at this time.      Disposition/Plan:  Individual therapy every other week with a focus on CBT and DBT interventions to promote development of positive and realistic thinking patterns and integration of relaxation skills into daily life.  Continued medication management is also  recommended.  Diagnosis:    F41.1 Generalized Anxiety Disorder  F34.1 Persistent Depressive Disorder, with intermittent major depressive episodes, mild

## 2014-09-19 ENCOUNTER — Encounter: Payer: 59 | Attending: Physician Assistant

## 2014-09-19 VITALS — Ht 65.5 in | Wt 230.4 lb

## 2014-09-19 DIAGNOSIS — Z713 Dietary counseling and surveillance: Secondary | ICD-10-CM | POA: Diagnosis not present

## 2014-09-19 DIAGNOSIS — E119 Type 2 diabetes mellitus without complications: Secondary | ICD-10-CM | POA: Insufficient documentation

## 2014-09-21 ENCOUNTER — Ambulatory Visit: Payer: Self-pay

## 2014-09-22 NOTE — Progress Notes (Signed)

## 2014-09-23 ENCOUNTER — Ambulatory Visit: Payer: Self-pay

## 2014-09-26 ENCOUNTER — Ambulatory Visit: Payer: Self-pay | Admitting: Sports Medicine

## 2014-09-28 ENCOUNTER — Ambulatory Visit (HOSPITAL_COMMUNITY): Payer: Self-pay | Admitting: Psychiatry

## 2014-09-28 ENCOUNTER — Ambulatory Visit: Payer: Self-pay

## 2014-09-29 ENCOUNTER — Ambulatory Visit (HOSPITAL_COMMUNITY): Payer: Self-pay | Admitting: Psychiatry

## 2014-10-02 ENCOUNTER — Ambulatory Visit: Payer: Self-pay | Admitting: Sports Medicine

## 2014-10-05 ENCOUNTER — Ambulatory Visit (INDEPENDENT_AMBULATORY_CARE_PROVIDER_SITE_OTHER): Payer: 59 | Admitting: Sports Medicine

## 2014-10-05 ENCOUNTER — Encounter: Payer: Self-pay | Admitting: Sports Medicine

## 2014-10-05 VITALS — BP 117/69 | HR 76 | Ht 65.5 in | Wt 232.0 lb

## 2014-10-05 DIAGNOSIS — M51369 Other intervertebral disc degeneration, lumbar region without mention of lumbar back pain or lower extremity pain: Secondary | ICD-10-CM

## 2014-10-05 DIAGNOSIS — M5136 Other intervertebral disc degeneration, lumbar region: Secondary | ICD-10-CM

## 2014-10-05 NOTE — Progress Notes (Signed)
  Subjective:    CC: Follow-up after epidural  HPI: Approximately 3 weeks ago this very pleasant 48 year old female had an L5-S1 interlaminar epidural, she returns today with 70% relief of her pain, she had 100% relief the day following the injection. She is happy with where her symptoms are currently, and does not need any further interventional treatment.  Past medical history, Surgical history, Family history not pertinant except as noted below, Social history, Allergies, and medications have been entered into the medical record, reviewed, and no changes needed.   Review of Systems: No fevers, chills, night sweats, weight loss, chest pain, or shortness of breath.   Objective:    General: Well Developed, well nourished, and in no acute distress.  Neuro: Alert and oriented x3, extra-ocular muscles intact, sensation grossly intact.  HEENT: Normocephalic, atraumatic, pupils equal round reactive to light, neck supple, no masses, no lymphadenopathy, thyroid nonpalpable.  Skin: Warm and dry, no rashes. Cardiac: Regular rate and rhythm, no murmurs rubs or gallops, no lower extremity edema.  Respiratory: Clear to auscultation bilaterally. Not using accessory muscles, speaking in full sentences. Back Exam:  Inspection: Unremarkable  Motion: Flexion 45 deg, Extension 45 deg, Side Bending to 45 deg bilaterally,  Rotation to 45 deg bilaterally  SLR laying: Negative  XSLR laying: Negative  Palpable tenderness: None. FABER: negative. Sensory change: Gross sensation intact to all lumbar and sacral dermatomes.  Reflexes: 2+ at both patellar tendons, 2+ at achilles tendons, Babinski's downgoing.  Strength at foot  Plantar-flexion: 5/5 Dorsi-flexion: 5/5 Eversion: 5/5 Inversion: 5/5  Leg strength  Quad: 5/5 Hamstring: 5/5 Hip flexor: 5/5 Hip abductors: 5/5  Gait unremarkable.  Impression and Recommendations:

## 2014-10-05 NOTE — Assessment & Plan Note (Signed)
70% improvement in symptoms after L5-S1 interlaminar epidural. Happy with results so far, does not need any further intervention. I can see her back on an as-needed basis, and we can order epidurals as needed. Of note she is buying a puppy for Christmas.

## 2014-10-09 ENCOUNTER — Telehealth (HOSPITAL_COMMUNITY): Payer: Self-pay | Admitting: *Deleted

## 2014-10-09 DIAGNOSIS — F331 Major depressive disorder, recurrent, moderate: Secondary | ICD-10-CM

## 2014-10-09 MED ORDER — LAMOTRIGINE 150 MG PO TABS: 150.0000 mg | ORAL_TABLET | Freq: Every day | ORAL | Status: DC

## 2014-10-09 MED ORDER — CITALOPRAM HYDROBROMIDE 20 MG PO TABS: ORAL_TABLET | ORAL | Status: DC

## 2014-10-09 NOTE — Telephone Encounter (Signed)
Pt requested refill for Celexa 20mg  and Lamictal 150mg . Per Dr. De Nurse, pt is authorized for 1 refill for Celexa 20mg , Qty 30 and Lamictal 150mg , Qty 30. Pt was informed of follow up appt on 10/27/14 with Dr. De Nurse. Pt states and shows understanding.

## 2014-10-11 ENCOUNTER — Ambulatory Visit: Payer: Self-pay

## 2014-10-12 ENCOUNTER — Ambulatory Visit (HOSPITAL_COMMUNITY): Payer: Self-pay | Admitting: Licensed Clinical Social Worker

## 2014-10-24 ENCOUNTER — Ambulatory Visit: Payer: Self-pay

## 2014-10-26 ENCOUNTER — Ambulatory Visit (INDEPENDENT_AMBULATORY_CARE_PROVIDER_SITE_OTHER): Payer: 59 | Admitting: Licensed Clinical Social Worker

## 2014-10-26 DIAGNOSIS — F411 Generalized anxiety disorder: Secondary | ICD-10-CM | POA: Diagnosis not present

## 2014-10-26 NOTE — Psych (Signed)
   THERAPIST PROGRESS NOTE  Session Time: 9:00am-9:55am  Participation Level: Active  Behavioral Response: DisheveledAlertDepressed and tearful  Type of Therapy: Individual Therapy  Treatment Goals addressed: Anxiety  Interventions: CBT  Therapist Interventions:  Gathered information about significant events and changes in mood and functioning since last seen in late November.  Discussed how she has come to the conclusion that she cannot continue operating at the same level of stress.  Encouraged patient to give herself permission to take a few minutes each day to slow down and focus her attention on calming herself.       Summary: Javayah reported that over the past month she has been feeling more sad, stressed, and agitated.  She identified several life stressors she has experienced.  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   Dnya said she is considering looking into getting approved for FMLA.  If approved she plans to focus on getting herself well so that she can do a better job of caring for her family.       Suicidal/Homicidal: Patient admitted to having vague SI about not wanting to wake up in the morning.      Plan: Return again in approximately 2 weeks.  Will share relaxation exercises with patient.  Diagnosis: Generalized Anxiety Disorder       Armandina Stammer 10/26/2014

## 2014-10-27 ENCOUNTER — Encounter (HOSPITAL_COMMUNITY): Payer: Self-pay | Admitting: Psychiatry

## 2014-10-27 ENCOUNTER — Ambulatory Visit (INDEPENDENT_AMBULATORY_CARE_PROVIDER_SITE_OTHER): Payer: 59 | Admitting: Psychiatry

## 2014-10-27 VITALS — BP 140/90 | HR 80 | Ht 65.5 in | Wt 239.0 lb

## 2014-10-27 DIAGNOSIS — F331 Major depressive disorder, recurrent, moderate: Secondary | ICD-10-CM

## 2014-10-27 DIAGNOSIS — F39 Unspecified mood [affective] disorder: Secondary | ICD-10-CM

## 2014-10-27 DIAGNOSIS — F411 Generalized anxiety disorder: Secondary | ICD-10-CM

## 2014-10-27 MED ORDER — CITALOPRAM HYDROBROMIDE 20 MG PO TABS: ORAL_TABLET | ORAL | Status: DC

## 2014-10-27 MED ORDER — LAMOTRIGINE 150 MG PO TABS: 150.0000 mg | ORAL_TABLET | Freq: Every day | ORAL | Status: DC

## 2014-10-27 NOTE — Progress Notes (Signed)
Patient ID: Shannon [name], female   DOB: [birth year], 49 y.o.   MRN: [id number]  [facility] Follow up visit  Shannon [name] [id number] 49 y.o.  10/27/2014 12:33 PM  Chief Complaint:  Depression and feeling edgy  History of Present Illness:   Patient Presents for follow up and medication management for Major depression and Generalized anxiety disorder. In past she has  followed with Dr. Letta Moynahan psychiatrist office for many years. She has been on Lamictal and Zoloft. Recently her Zoloft was discontinued and she was started on that to [facility]. Apparently she felt more agitation on the latuda so she called their office for a change. She was not getting a quick responses so she wanted to change provider.   Recent aggravating factors included that of her cousin who she was closely attached to. She also has been on steroids. Says medications were changed but apparently it made her more agitated. She wants to get back on Lamictal and some other SSRI.  Last visit we added celexa which is now 66m. Lamictal 1520m She has noticed some impvorment but continues to have stress related with her son. Her son is 117ears of age but [place] and is diagnosed with ADHD. 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   Her other factors include difficult marriage that started 20 years ago leading to divorced. She now take care of her son.  Modifying factors; she likes to work on crafts goes to [employer] also arts work.  Associated symptoms include in the past depressive episode including disturbed sleep, energy, appetite feeling of hopelessness despair. Does not endorse hopelessness to the point of suicidal or homicidal thoughts but has crying spells  and anhedonia. She withdraws and does not future she's going to get better when she is in a depressed mood  She has been diagnosed with mood disorder there is no clear history of bipolar but she does get irritable or edgy for some time but she feels it is out of frustration and depression  This family history depression in the family  She also endorses having panic like symptoms in 2013 she had to go to the ED because of chest pain palpitations and she was diagnosed with panic disorder. She does not have frequent panic attacks and with the current medication regimen she is not too much worried about having another panic attack she does takes Xanax when necessary a small dose of 0.25 mg.  No associated psychotic symptoms delusions, hallucinations. No history of physical or sexual trauma when she was growing up she had difficult relationship with her husband and then later on with her boyfriend but not to the point of having flashbacks or nightmares about it.   Past Psychiatric History/Hospitalization(s) Manged for depression and mood symptoms for more then 20 years.  Hospitalization for psychiatric illness: No History of Electroconvulsive Shock Therapy: No Prior Suicide Attempts: No  Medical History; Past Medical History  Diagnosis Date  . Hyperlipidemia   . Sleep apnea   . Anxiety   . Depression   . Diabetes mellitus without complication     Allergies: Allergies  Allergen Reactions  . Belviq [Lorcaserin Hcl]     Increased appetitie  .  Contrave [Naltrexone-Bupropion Hcl Er]     Sleepy.  . Food     Big Red Gum  . Latuda [Lurasidone Hcl]     Severe aggitation    Medications: Outpatient Encounter Prescriptions as of 10/27/2014  Medication Sig  . ALPRAZolam (XANAX) 0.25 MG tablet Take 0.25 mg by mouth as needed.  . ALPRAZolam (XANAX) 0.5 MG tablet   . citalopram (CELEXA) 20 MG tablet Take one tablet and half. Total of 18m qd  . lamoTRIgine (LAMICTAL) 150 MG tablet Take 1  tablet (150 mg total) by mouth daily.  .Marland Kitchenlevothyroxine (SYNTHROID, LEVOTHROID) 25 MCG tablet Take 1 tablet (25 mcg total) by mouth daily before breakfast.  . meloxicam (MOBIC) 15 MG tablet Take 1 tablet (15 mg total) by mouth daily.  . metFORMIN (GLUCOPHAGE) 500 MG tablet Take 1 tablet (500 mg total) by mouth 2 (two) times daily with a meal.  . simvastatin (ZOCOR) 20 MG tablet Take 1 tablet (20 mg total) by mouth at bedtime.  . [DISCONTINUED] citalopram (CELEXA) 20 MG tablet Take half for first 4 days then change to 1 tablet a day.  . [DISCONTINUED] lamoTRIgine (LAMICTAL) 150 MG tablet Take 1 tablet (150 mg total) by mouth daily.  . [DISCONTINUED] lamoTRIgine (LAMICTAL) 200 MG tablet      Substance Abuse History: Denies using illicit drugs, alcohol or other substances.  Family History; Family History  Problem Relation Age of Onset  . Hyperlipidemia Mother   . Sleep apnea Mother   . Depression Mother   . Diabetes Father   . Hypertension Father   . COPD Father   . Diabetes Paternal Aunt   . Diabetes Paternal Uncle   . Alcohol abuse Paternal Uncle   . Alcohol abuse Paternal Grandfather   . Depression Sister       Labs:  Recent Results (from the past 2160 hour(s))  POCT HgB A1C     Status: None   Collection Time: 08/14/14  8:44 AM  Result Value Ref Range   Hemoglobin A1C 6.5   TSH     Status: None   Collection Time: 08/14/14  9:23 AM  Result Value Ref Range   TSH 4.366 0.350 - 4.500 uIU/mL  T4, free     Status: Abnormal   Collection Time: 08/14/14  9:23 AM  Result Value Ref Range   Free T4 0.76 (L) 0.80 - 1.80 ng/dL  COMPLETE METABOLIC PANEL WITH GFR     Status: Abnormal   Collection Time: 08/14/14  9:23 AM  Result Value Ref Range   Sodium 138 135 - 145 mEq/L   Potassium 4.2 3.5 - 5.3 mEq/L   Chloride 103 96 - 112 mEq/L   CO2 23 19 - 32 mEq/L   Glucose, Bld 111 (H) 70 - 99 mg/dL   BUN 8 6 - 23 mg/dL   Creat 0.57 0.50 - 1.10 mg/dL   Total Bilirubin 0.4 0.2 - 1.2  mg/dL   Alkaline Phosphatase 80 39 - 117 U/L   AST 31 0 - 37 U/L   ALT 29 0 - 35 U/L   Total Protein 6.8 6.0 - 8.3 g/dL   Albumin 4.2 3.5 - 5.2 g/dL   Calcium 8.9 8.4 - 10.5 mg/dL   GFR, Est African American >89 mL/min   GFR, Est Non African American >89 mL/min    Comment:   The estimated GFR is a calculation valid for adults (>=150years old) that uses the CKD-EPI algorithm to adjust for age and sex.  It is   not to be used for children, pregnant women, hospitalized patients,    patients on dialysis, or with rapidly changing kidney function. According to the NKDEP, eGFR >89 is normal, 60-89 shows mild impairment, 30-59 shows moderate impairment, 15-29 shows severe impairment and <15 is ESRD.    B12     Status: None   Collection Time: 08/14/14  9:23 AM  Result Value Ref Range   Vitamin B-12 705 211 - 911 pg/mL  Vitamin D (25 hydroxy)     Status: None   Collection Time: 08/14/14  9:23 AM  Result Value Ref Range   Vit D, 25-Hydroxy 39 30 - 89 ng/mL    Comment: This assay accurately quantifies Vitamin D, which is the sum of the 25-Hydroxy forms of Vitamin D2 and D3.  Studies have shown that the optimum concentration of 25-Hydroxy Vitamin D is 30 ng/mL or higher.  Concentrations of Vitamin D between 20 and 29 ng/mL are considered to be insufficient and concentrations less than 20 ng/mL are considered to be deficient for Vitamin D.  Lipid panel     Status: Abnormal   Collection Time: 08/14/14  9:23 AM  Result Value Ref Range   Cholesterol 228 (H) 0 - 200 mg/dL    Comment: ATP III Classification:       < 200        mg/dL        Desirable      200 - 239     mg/dL        Borderline High      >= 240        mg/dL        High     Triglycerides 330 (H) <150 mg/dL   HDL 37 (L) >39 mg/dL   Total CHOL/HDL Ratio 6.2 Ratio   VLDL 66 (H) 0 - 40 mg/dL   LDL Cholesterol 125 (H) 0 - 99 mg/dL    Comment:   Total Cholesterol/HDL Ratio:CHD Risk                        Coronary Heart Disease Risk  Table                                        Men       Women          1/2 Average Risk              3.4        3.3              Average Risk              5.0        4.4           2X Average Risk              9.6        7.1           3X Average Risk             23.4       11.0 Use the calculated Patient Ratio above and the CHD Risk table  to determine the patient's CHD Risk. ATP III Classification (LDL):       < 100        mg/dL  Optimal      100 - 129     mg/dL         Near or Above Optimal      130 - 159     mg/dL         Borderline High      160 - 189     mg/dL         High       > 190        mg/dL         Very High         Musculoskeletal: Strength & Muscle Tone: within normal limits Gait & Station: normal Patient leans: N/A  Mental Status Examination;   Psychiatric Specialty Exam: Physical Exam  Constitutional: She appears well-developed and well-nourished. No distress.  Skin: She is not diaphoretic.    Review of Systems  Constitutional: Negative for fever.  Cardiovascular: Negative for chest pain.  Gastrointestinal: Negative for heartburn.  Skin: Negative for itching.  Neurological: Negative for tingling and headaches.  Psychiatric/Behavioral: Positive for depression. Negative for suicidal ideas, hallucinations and substance abuse. The patient is nervous/anxious. The patient does not have insomnia.     Blood pressure 140/90, pulse 80, height 5' 5.5" (1.664 m), weight 239 lb (108.41 kg).Body mass index is 39.15 kg/(m^2).  General Appearance: Casual  Eye Contact::  Fair  Speech:  Normal Rate  Volume:  Normal  Mood:  Dysphoric  Affect:  Congruent  Thought Process:  Coherent  Orientation:  Full (Time, Place, and Person)  Thought Content:  Rumination  Suicidal Thoughts:  No  Homicidal Thoughts:  No  Memory:  Immediate;   Fair Recent;   Fair  Judgement:  Fair  Insight:  Shallow  Psychomotor Activity:  Normal  Concentration:  Fair  Recall:  Fair   Akathisia:  Negative  Handed:  Right  AIMS (if indicated):     Assets:  Communication Skills Desire for Improvement Financial Resources/Insurance Housing  Sleep:        Assessment: Axis I: mood disorder unspecified. Rule out mood disorder secondary to general medical condition. Major depressive disorder recurrent moderate. Rule out panic disorder. Generalized anxiety disorder  Axis II: deferred  Axis III:  Past Medical History  Diagnosis Date  . Hyperlipidemia   . Sleep apnea   . Anxiety   . Depression   . Diabetes mellitus without complication     Axis IV: psychosocial   Treatment Plan and Summary: Increase Celexa to 30 mg.  Continue Lamictal 150 mg. There is no rash otherwise reported side effects. She is seeing or counselor that has been helping her psychosocial issues. She understands medication did not be the only modality of treatment considering her stressors related to psychosocial issue Pertinent Labs and Relevant Prior Notes reviewed. Medication Side effects, benefits and risks reviewed/discussed with Patient. Time given for patient to respond and asks questions regarding the Diagnosis and Medications. Safety concerns and to report to ER if suicidal or call 911. Relevant Medications refilled or called in to pharmacy. Discussed weight maintenance and Sleep Hygiene. Follow up with Primary care provider in regards to Medical conditions. Recommend compliance with medications and follow up office appointments. Discussed to avail opportunity to consider or/and continue Individual therapy with Counselor. Greater than 50% of time was spend in counseling and coordination of care with the patient.  Schedule for Follow up visit in 4  weeks or call in earlier as necessary.   Merian Capron, MD 10/27/2014

## 2014-10-31 ENCOUNTER — Encounter: Payer: 59 | Attending: Physician Assistant

## 2014-10-31 ENCOUNTER — Ambulatory Visit (INDEPENDENT_AMBULATORY_CARE_PROVIDER_SITE_OTHER): Payer: 59 | Admitting: Sports Medicine

## 2014-10-31 ENCOUNTER — Encounter: Payer: Self-pay | Admitting: Sports Medicine

## 2014-10-31 DIAGNOSIS — M51369 Other intervertebral disc degeneration, lumbar region without mention of lumbar back pain or lower extremity pain: Secondary | ICD-10-CM

## 2014-10-31 DIAGNOSIS — E119 Type 2 diabetes mellitus without complications: Secondary | ICD-10-CM | POA: Insufficient documentation

## 2014-10-31 DIAGNOSIS — Z713 Dietary counseling and surveillance: Secondary | ICD-10-CM | POA: Diagnosis not present

## 2014-10-31 DIAGNOSIS — M5136 Other intervertebral disc degeneration, lumbar region: Secondary | ICD-10-CM

## 2014-10-31 MED ORDER — IBUPROFEN 800 MG PO TABS: 800.0000 mg | ORAL_TABLET | Freq: Three times a day (TID) | ORAL | Status: DC | PRN

## 2014-10-31 NOTE — Assessment & Plan Note (Signed)
Good response to left-sided L5-S1 interlaminar epidural 2 months ago. Now having a recurrence of pain, repeat injection as above, and increasing ibuprofen to 800 mg 3 times a day.

## 2014-10-31 NOTE — Progress Notes (Signed)
Patient was seen on 10/31/2014 for the third of a series of three diabetes self-management courses at the [facility]. The following learning objectives were met by the patient during this class:  . State the amount of activity recommended for healthy living . Describe activities suitable for individual needs . Identify ways to regularly incorporate activity into daily life . Identify barriers to activity and ways to over come these barriers  Identify diabetes medications being personally used and their primary action for lowering glucose and possible side effects . Describe role of stress on blood glucose and develop strategies to address psychosocial issues . Identify diabetes complications and ways to prevent them  Explain how to manage diabetes during illness . Evaluate success in meeting personal goal . Establish 2-3 goals that they will plan to diligently work on until they return for the  88-monthfollow-up visit  Goals:   I will count my carb choices at most meals and snacks  I will be active 30 minutes or more 3 times a week  I will take my diabetes medications as scheduled  I will eat less unhealthy fats  I will test my glucose at least 1 times a day, 7 days a week  I will look at patterns in my record book at least 2 days a month  To help manage stress I will  walk at least 5 times a week  Your patient has identified these potential barriers to change:  Motivation Finances Stress  Your patient has identified their diabetes self-care support plan as  Family Support Link to wellness program  Plan:  Attend Core 4 in 4 months

## 2014-10-31 NOTE — Progress Notes (Signed)
  Subjective:    CC: Recheck low back pain  HPI: This is a very pleasant 49 year old female with bilateral low back pain and radiculopathy, left worse than right. She did respond initially very well to a left-sided L5-S1 interlaminar epidural, this was 2 months ago and she is now starting to have a mild recurrence of pain. She is amenable to try epidural #2 of the series, she is currently only using over-the-counter ibuprofen. Symptoms are moderate, persistent.  Past medical history, Surgical history, Family history not pertinant except as noted below, Social history, Allergies, and medications have been entered into the medical record, reviewed, and no changes needed.   Review of Systems: No fevers, chills, night sweats, weight loss, chest pain, or shortness of breath.   Objective:    General: Well Developed, well nourished, and in no acute distress.  Neuro: Alert and oriented x3, extra-ocular muscles intact, sensation grossly intact.  HEENT: Normocephalic, atraumatic, pupils equal round reactive to light, neck supple, no masses, no lymphadenopathy, thyroid nonpalpable.  Skin: Warm and dry, no rashes. Cardiac: Regular rate and rhythm, no murmurs rubs or gallops, no lower extremity edema.  Respiratory: Clear to auscultation bilaterally. Not using accessory muscles, speaking in full sentences.  Impression and Recommendations:

## 2014-11-01 ENCOUNTER — Other Ambulatory Visit: Payer: Self-pay | Admitting: Physician Assistant

## 2014-11-09 ENCOUNTER — Ambulatory Visit (HOSPITAL_COMMUNITY): Payer: Self-pay | Admitting: Licensed Clinical Social Worker

## 2014-11-10 ENCOUNTER — Ambulatory Visit: Payer: Self-pay | Admitting: Family Medicine

## 2014-11-10 ENCOUNTER — Ambulatory Visit: Payer: Self-pay | Admitting: Physician Assistant

## 2014-11-14 ENCOUNTER — Encounter: Payer: Self-pay | Admitting: Physician Assistant

## 2014-11-14 ENCOUNTER — Ambulatory Visit (INDEPENDENT_AMBULATORY_CARE_PROVIDER_SITE_OTHER): Payer: 59 | Admitting: Physician Assistant

## 2014-11-14 VITALS — BP 129/77 | HR 100 | Ht 65.5 in | Wt 230.0 lb

## 2014-11-14 DIAGNOSIS — M5136 Other intervertebral disc degeneration, lumbar region: Secondary | ICD-10-CM

## 2014-11-14 NOTE — Progress Notes (Signed)
   Subjective:    Patient ID: Shannon [name], female    DOB: 12/21/1965, 49 y.o.   MRN: [id number]  HPI Patient is a 49 year old female who presents to the clinic to have FMLA paperwork filled out for her lumbar degenerative disc disease. She's previously seen Dr.Thekkendam in office for this condition. Lumbar DDD seems to be getting some better with epidural injections. She gets her 2nd injection this week. She certainly feels like it is time with her numbness and tingling starting to come back in bilateral legs.    Review of Systems  All other systems reviewed and are negative.      Objective:   Physical Exam        Assessment & Plan:  Lumbar DDD- discuss with patient that Dr. Sophronia Simas should have filled out paperwork. I discussed with him and filled out today. We gave her 1 episode every 3-6 months of 2-3 days per epsiode to miss work with epidural injections as needed every 3 months. Paperwork filled out and copied for out records to scan in.   Discussed with patient need follow up for diabetes to schedule.

## 2014-11-16 ENCOUNTER — Ambulatory Visit
Admission: RE | Admit: 2014-11-16 | Discharge: 2014-11-16 | Disposition: A | Discharge status: Home / Self Care | Payer: 59 | Source: Ambulatory Visit | Attending: Sports Medicine | Admitting: Sports Medicine

## 2014-11-16 DIAGNOSIS — M5136 Other intervertebral disc degeneration, lumbar region: Secondary | ICD-10-CM

## 2014-11-16 MED ORDER — IOHEXOL 180 MG/ML  SOLN
1.0000 mL | Freq: Once | INTRAMUSCULAR | Status: AC | PRN
  Administered 2014-11-16: 1 mL via EPIDURAL

## 2014-11-16 MED ORDER — METHYLPREDNISOLONE ACETATE 40 MG/ML INJ SUSP (RADIOLOG
120.0000 mg | Freq: Once | INTRAMUSCULAR | Status: AC
  Administered 2014-11-16: 120 mg via EPIDURAL

## 2014-11-16 NOTE — Discharge Instructions (Signed)

## 2014-11-23 ENCOUNTER — Ambulatory Visit (INDEPENDENT_AMBULATORY_CARE_PROVIDER_SITE_OTHER): Payer: 59 | Admitting: Licensed Clinical Social Worker

## 2014-11-23 DIAGNOSIS — F411 Generalized anxiety disorder: Secondary | ICD-10-CM | POA: Diagnosis not present

## 2014-11-23 DIAGNOSIS — F341 Dysthymic disorder: Secondary | ICD-10-CM

## 2014-11-23 NOTE — Psych (Signed)
   THERAPIST PROGRESS NOTE  Session Time: 9:00am-9:55am  Participation Level: Active  Behavioral Response: DisheveledAlertDepressed and tearful  Type of Therapy: Individual Therapy  Treatment Goals addressed: Anxiety  Interventions: CBT  Therapist Interventions:  Gathered information about significant events and changes in mood and functioning since last seen about a month ago.  Taught patient some relaxation exercises: deep breathing, visualization, and progressive muscle relaxation.  Encouraged her to take some time each day to practice the exercises.          Summary: Reported that she has moved forward with requesting FMLA.  Continues to experience a significant level of anxiety and depression.  Indicated she was familiar with how to do deep breathing.  Indicated she plans to practice some of the exercises.         Suicidal/Homicidal: Patient admitted to having vague SI about not wanting to wake up in the morning.      Plan: Return again in approximately one to two weeks.  May start helping patient recognize and change negative thinking patterns.  Diagnosis: Generalized Anxiety Disorder       Armandina Stammer 11/23/2014

## 2014-11-24 ENCOUNTER — Telehealth (HOSPITAL_COMMUNITY): Payer: Self-pay

## 2014-11-24 MED ORDER — ALPRAZOLAM 0.5 MG PO TABS: 0.5000 mg | ORAL_TABLET | Freq: Every evening | ORAL | Status: DC | PRN

## 2014-11-24 NOTE — Telephone Encounter (Signed)
Xanax 0.5mg  printed to be faxed. refill

## 2014-11-24 NOTE — Telephone Encounter (Signed)
PT needs a script for Xanax she will pick up asap .

## 2014-11-29 ENCOUNTER — Encounter: Payer: Self-pay | Admitting: Physician Assistant

## 2014-12-01 ENCOUNTER — Encounter (HOSPITAL_COMMUNITY): Payer: Self-pay | Admitting: Psychiatry

## 2014-12-01 ENCOUNTER — Ambulatory Visit (INDEPENDENT_AMBULATORY_CARE_PROVIDER_SITE_OTHER): Payer: 59 | Admitting: Psychiatry

## 2014-12-01 VITALS — Ht 65.5 in | Wt 230.0 lb

## 2014-12-01 DIAGNOSIS — F331 Major depressive disorder, recurrent, moderate: Secondary | ICD-10-CM

## 2014-12-01 DIAGNOSIS — F410 Panic disorder [episodic paroxysmal anxiety] without agoraphobia: Secondary | ICD-10-CM

## 2014-12-01 DIAGNOSIS — F411 Generalized anxiety disorder: Secondary | ICD-10-CM

## 2014-12-01 MED ORDER — LAMOTRIGINE 200 MG PO TABS: 200.0000 mg | ORAL_TABLET | Freq: Every day | ORAL | Status: DC

## 2014-12-01 MED ORDER — CITALOPRAM HYDROBROMIDE 40 MG PO TABS: ORAL_TABLET | ORAL | Status: DC

## 2014-12-01 NOTE — Progress Notes (Signed)
Patient ID: Shannon [name], female   DOB: Feb 12, 1966, 49 y.o.   MRN: [id number]  [facility] up visit  MERCER STALLWORTH [id number] 49 y.o.  12/01/2014 10:14 AM  Chief Complaint:  Depression and feeling edgy  History of Present Illness:   Patient Presents for follow up and medication management for Major depression and Generalized anxiety disorder. In past she has  followed with Dr. Letta Moynahan psychiatrist office for many years. She has been on Lamictal and Zoloft. Recently her Zoloft was discontinued and she was started on that to [place]. Apparently she felt more agitation on the latuda so she called their office for a change. She was not getting a quick responses so she wanted to change provider.    Her son is 33 years of age but his dad has moved to [place]. He is having difficulty adjusting and is diagnosed with ADHD. 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  Last visit and increase the Celexa and Lamictal. She is still having depressive symptoms including anhedonia and decreased interest she still manages her work. She worries about her work and water her son. At times when she wakes up she feels The morning decrease focusing decreased concentration and tiredness. She is thinking of getting FMLA as his having coping concerns at work and guessed stressed out easily she feels that at least she should give more try for taking a FMLA. She still has not talked to much about her divorce with the therapist, which has been a significant contributing factor to her depression.   Severity of depression. 5 out of 10. 10 being no depression.  Her other factors include difficult marriage that started 20 years ago leading to divorced. She now take care of her son.  Modifying factors; she  likes to work on crafts goes to [employer] and also arts work.  She has been diagnosed with mood disorder there is no clear history of bipolar but she does get irritable or edgy for some time but she feels it is out of frustration and depression  This family history depression in the family  She also endorses having panic like symptoms in 2013 she had to go to the ED because of chest pain palpitations and she was diagnosed with panic disorder. She does not have frequent panic attacks and with the current medication regimen she is not too much worried about having another panic attack she does takes Xanax when necessary a small dose of 0.25 mg.  No associated psychotic symptoms delusions, hallucinations. No history of physical or sexual trauma when she was growing up she had difficult relationship with her husband and then later on with her boyfriend but not to the point of having flashbacks or nightmares about it.   Past Psychiatric History/Hospitalization(s) Manged for depression and mood symptoms for more then 20 years.  Hospitalization for psychiatric illness: No History of Electroconvulsive Shock Therapy: No Prior Suicide Attempts: No  Medical History; Past Medical History  Diagnosis Date  . Hyperlipidemia   . Sleep apnea   . Anxiety   . Depression   . Diabetes mellitus without complication     Allergies: Allergies  Allergen Reactions  . Belviq [Lorcaserin Hcl]     Increased appetitie  . Contrave [Naltrexone-Bupropion  Hcl Er]     Sleepy.  . Food     Big Red Gum  . Latuda [Lurasidone Hcl]     Severe aggitation    Medications: Outpatient Encounter Prescriptions as of 12/01/2014  Medication Sig  . ALPRAZolam (XANAX) 0.5 MG tablet   . ALPRAZolam (XANAX) 0.5 MG tablet Take 1 tablet (0.5 mg total) by mouth at bedtime as needed for anxiety.  . citalopram (CELEXA) 40 MG tablet Take one tablet and half. Total of 30mg  qd  . ibuprofen (ADVIL,MOTRIN) 800 MG tablet Take 1 tablet  (800 mg total) by mouth every 8 (eight) hours as needed.  . lamoTRIgine (LAMICTAL) 200 MG tablet Take 1 tablet (200 mg total) by mouth daily.  Marland Kitchen levothyroxine (SYNTHROID, LEVOTHROID) 25 MCG tablet TAKE 1 TABLET (25 MCG TOTAL) BY MOUTH DAILY BEFORE BREAKFAST.  . meloxicam (MOBIC) 15 MG tablet   . metFORMIN (GLUCOPHAGE) 500 MG tablet Take 1 tablet (500 mg total) by mouth 2 (two) times daily with a meal.  . simvastatin (ZOCOR) 20 MG tablet Take 1 tablet (20 mg total) by mouth at bedtime.  . [DISCONTINUED] citalopram (CELEXA) 20 MG tablet Take one tablet and half. Total of 30mg  qd  . [DISCONTINUED] lamoTRIgine (LAMICTAL) 150 MG tablet Take 1 tablet (150 mg total) by mouth daily.     Substance Abuse History: Denies using illicit drugs, alcohol or other substances.  Family History; Family History  Problem Relation Age of Onset  . Hyperlipidemia Mother   . Sleep apnea Mother   . Depression Mother   . Diabetes Father   . Hypertension Father   . COPD Father   . Diabetes Paternal Aunt   . Diabetes Paternal Uncle   . Alcohol abuse Paternal Uncle   . Alcohol abuse Paternal Grandfather   . Depression Sister       Labs:  No results found for this or any previous visit (from the past 2160 hour(s)).     Musculoskeletal: Strength & Muscle Tone: within normal limits Gait & Station: normal Patient leans: N/A  Mental Status Examination;   Psychiatric Specialty Exam: Physical Exam  Constitutional: She appears well-developed and well-nourished. No distress.  Skin: She is not diaphoretic.    Review of Systems  Constitutional: Negative.   Cardiovascular: Negative for chest pain.  Skin: Negative for rash.  Neurological: Negative for tremors.  Psychiatric/Behavioral: Positive for depression.    Height 5' 5.5" (1.664 m), weight 230 lb (104.327 kg).Body mass index is 37.68 kg/(m^2).  General Appearance: Casual  Eye Contact::  Fair  Speech:  Normal Rate  Volume:  Normal  Mood:   Dysphoric  Affect:  Congruent  Thought Process:  Coherent  Orientation:  Full (Time, Place, and Person)  Thought Content:  Rumination  Suicidal Thoughts:  No  Homicidal Thoughts:  No  Memory:  Immediate;   Fair Recent;   Fair  Judgement:  Fair  Insight:  Shallow  Psychomotor Activity:  Normal  Concentration:  Fair  Recall:  Fair  Akathisia:  Negative  Handed:  Right  AIMS (if indicated):     Assets:  Communication Skills Desire for Improvement Financial Resources/Insurance Housing  Sleep:        Assessment: Axis I: mood disorder unspecified. Rule out mood disorder secondary to general medical condition. Major depressive disorder recurrent moderate. Rule out panic disorder. Generalized anxiety disorder  Axis II: deferred  Axis III:  Past Medical History  Diagnosis Date  . Hyperlipidemia   . Sleep apnea   .  Anxiety   . Depression   . Diabetes mellitus without complication     Axis IV: psychosocial   Treatment Plan and Summary: Increase Celexa to 40 mg. Increase lamictal 200mg  qd.  Xanax 0.5 mg qd prn for anxiety and panic attacks.  Talk with therapist in regard to her divorce.   She is seeing or counselor that has been helping her psychosocial issues. She understands medication did not be the only modality of treatment considering her stressors related to psychosocial issue Pertinent Labs and Relevant Prior Notes reviewed. Medication Side effects, benefits and risks reviewed/discussed with Patient. Time given for patient to respond and asks questions regarding the Diagnosis and Medications. Safety concerns and to report to ER if suicidal or call 911. Relevant Medications refilled or called in to pharmacy. Discussed weight maintenance and Sleep Hygiene. Follow up with Primary care provider in regards to Medical conditions. Recommend compliance with medications and follow up office appointments. Discussed to avail opportunity to consider or/and continue Individual  therapy with Counselor. Greater than 50% of time was spend in counseling and coordination of care with the patient.  Schedule for Follow up visit in 4  weeks or call in earlier as necessary.   Merian Capron, MD 12/01/2014

## 2014-12-04 ENCOUNTER — Encounter: Payer: Self-pay | Admitting: Physician Assistant

## 2014-12-08 ENCOUNTER — Ambulatory Visit (HOSPITAL_COMMUNITY): Payer: Self-pay | Admitting: Licensed Clinical Social Worker

## 2014-12-20 ENCOUNTER — Encounter: Payer: Self-pay | Admitting: Physician Assistant

## 2014-12-26 ENCOUNTER — Ambulatory Visit (HOSPITAL_COMMUNITY): Payer: Self-pay | Admitting: Licensed Clinical Social Worker

## 2014-12-29 ENCOUNTER — Ambulatory Visit (INDEPENDENT_AMBULATORY_CARE_PROVIDER_SITE_OTHER): Payer: 59 | Admitting: Physician Assistant

## 2014-12-29 ENCOUNTER — Encounter: Payer: Self-pay | Admitting: Physician Assistant

## 2014-12-29 ENCOUNTER — Ambulatory Visit (HOSPITAL_COMMUNITY): Payer: Self-pay | Admitting: Psychiatry

## 2014-12-29 VITALS — BP 126/80 | HR 87 | Ht 65.5 in | Wt 232.0 lb

## 2014-12-29 DIAGNOSIS — Z0000 Encounter for general adult medical examination without abnormal findings: Secondary | ICD-10-CM | POA: Diagnosis not present

## 2014-12-29 DIAGNOSIS — E669 Obesity, unspecified: Secondary | ICD-10-CM

## 2014-12-29 DIAGNOSIS — Z6838 Body mass index (BMI) 38.0-38.9, adult: Secondary | ICD-10-CM

## 2014-12-29 DIAGNOSIS — E118 Type 2 diabetes mellitus with unspecified complications: Secondary | ICD-10-CM

## 2014-12-29 DIAGNOSIS — E039 Hypothyroidism, unspecified: Secondary | ICD-10-CM

## 2014-12-29 DIAGNOSIS — Z1239 Encounter for other screening for malignant neoplasm of breast: Secondary | ICD-10-CM | POA: Diagnosis not present

## 2014-12-29 DIAGNOSIS — Z23 Encounter for immunization: Secondary | ICD-10-CM | POA: Diagnosis not present

## 2014-12-29 DIAGNOSIS — E785 Hyperlipidemia, unspecified: Secondary | ICD-10-CM

## 2014-12-29 LAB — LIPID PANEL
CHOLESTEROL: 157 mg/dL (ref 0–200)
HDL: 33 mg/dL — AB (ref >=46)
LDL Cholesterol: 92 mg/dL (ref 0–99)
Total CHOL/HDL Ratio: 4.8 Ratio
Triglycerides: 160 mg/dL — ABNORMAL HIGH (ref <150)
VLDL: 32 mg/dL (ref 0–40)

## 2014-12-29 LAB — POCT GLYCOSYLATED HEMOGLOBIN (HGB A1C): Hemoglobin A1C: 6.4

## 2014-12-29 LAB — COMPLETE METABOLIC PANEL WITH GFR
ALBUMIN: 3.9 g/dL (ref 3.5–5.2)
ALT: 20 U/L (ref 0–35)
AST: 17 U/L (ref 0–37)
Alkaline Phosphatase: 77 U/L (ref 39–117)
BUN: 12 mg/dL (ref 6–23)
CALCIUM: 8.7 mg/dL (ref 8.4–10.5)
CHLORIDE: 104 meq/L (ref 96–112)
CO2: 25 mEq/L (ref 19–32)
CREATININE: 0.62 mg/dL (ref 0.50–1.10)
GFR, Est African American: >89 mL/min
GFR, Est Non African American: >89 mL/min
Glucose, Bld: 89 mg/dL (ref 70–99)
POTASSIUM: 4.5 meq/L (ref 3.5–5.3)
Sodium: 138 mEq/L (ref 135–145)
Total Bilirubin: 0.3 mg/dL (ref 0.2–1.2)
Total Protein: 6.4 g/dL (ref 6.0–8.3)

## 2014-12-29 LAB — POCT UA - MICROALBUMIN
Albumin/Creatinine Ratio, Urine, POC: <30
Creatinine, POC: 100 mg/dL
Microalbumin Ur, POC: 10 mg/L

## 2014-12-29 LAB — TSH: TSH: 1.779 u[IU]/mL (ref 0.350–4.500)

## 2014-12-29 NOTE — Progress Notes (Signed)
Subjective:    Patient ID: Shannon [name], female    DOB: 16-Nov-1965, 49 y.o.   MRN: [id number]  HPI DM- walking dog/out of metformin for a    Review of Systems     Objective:   Physical Exam        Assessment & Plan:   Subjective:     Shannon [name] is a 49 y.o. female and is here for a comprehensive physical exam. The patient reports problems - needs DM rechecked. not checking sugars. she is taking metformin daily. no hypoglycemic events. needs medication for thyroid refilled. she is very frustrated with her weight. she would like referral to talk about surgery. Marland Kitchen  History   Social History  . Marital Status: Married    Spouse Name: N/A  . Number of Children: N/A  . Years of Education: N/A   Occupational History  . Not on file.   Social History Main Topics  . Smoking status: Never Smoker   . Smokeless tobacco: Never Used  . Alcohol Use: No  . Drug Use: No  . Sexual Activity: Not Currently    Birth Control/ Protection: Condom   Other Topics Concern  . Not on file   Social History Narrative   Health Maintenance  Topic Date Due  . MAMMOGRAM  06/05/2009  . OPHTHALMOLOGY EXAM  02/07/2015  . INFLUENZA VACCINE  05/21/2015  . HEMOGLOBIN A1C  07/01/2015  . FOOT EXAM  12/29/2015  . URINE MICROALBUMIN  12/29/2015  . PAP SMEAR  01/08/2016  . PNEUMOCOCCAL POLYSACCHARIDE VACCINE (2) 12/29/2019  . TETANUS/TDAP  01/08/2023  . HIV Screening  Completed    The following portions of the patient's history were reviewed and updated as appropriate: allergies, current medications, past family history, past medical history, past social history, past surgical history and problem list.  Review of Systems A comprehensive review of systems was negative.   Objective:    BP 126/80 mmHg  Pulse 87  Ht 5' 5.5" (1.664 m)  Wt 232 lb (105.235 kg)  BMI 38.01 kg/m2 General appearance: alert, cooperative, appears stated age and moderately obese Head: Normocephalic, without obvious  abnormality, atraumatic Eyes: conjunctivae/corneas clear. PERRL, EOM's intact. Fundi benign. Ears: normal TM's and external ear canals both ears Nose: Nares normal. Septum midline. Mucosa normal. No drainage or sinus tenderness. Throat: lips, mucosa, and tongue normal; teeth and gums normal Neck: no adenopathy, no carotid bruit, no JVD, supple, symmetrical, trachea midline and thyroid not enlarged, symmetric, no tenderness/mass/nodules Back: symmetric, no curvature. ROM normal. No CVA tenderness. Lungs: clear to auscultation bilaterally Heart: regular rate and rhythm, S1, S2 normal, no murmur, click, rub or gallop Abdomen: soft, non-tender; bowel sounds normal; no masses,  no organomegaly Extremities: extremities normal, atraumatic, no cyanosis or edema Pulses: 2+ and symmetric Skin: Skin color, texture, turgor normal. No rashes or lesions Lymph nodes: Cervical, supraclavicular, and axillary nodes normal. Neurologic: Grossly normal    Assessment:    Healthy female exam.      Plan:    CPE- pap up to date. mammo ordered today. Fasting labs ordered. Hx of hyperlipidemia. Discussed weight see below. Calcium 1200mg  and vitamin d 800 units daily given.   Hypothyroidism- TSH ordered. Will adjust medications accordingly.   DM-.. Lab Results  Component Value Date   HGBA1C 6.4 12/29/2014   Doing great. Metformin refilled.  Follow up in 3 months.  Pneumonia 23 given today.  mirco normal.   Obesity/abnormal weight gain- tried and failed phentermine with  no weight loss. beliviq could not tolerate. Does not like the way wellbutrin made her feel. Referral made to bariatric surgery to discuss options.  See After Visit Summary for Counseling Recommendations

## 2014-12-29 NOTE — Patient Instructions (Signed)

## 2014-12-30 LAB — HIV ANTIBODY (ROUTINE TESTING W REFLEX): HIV: NONREACTIVE

## 2015-01-02 ENCOUNTER — Encounter (HOSPITAL_COMMUNITY): Payer: Self-pay | Admitting: Psychiatry

## 2015-01-02 ENCOUNTER — Ambulatory Visit (INDEPENDENT_AMBULATORY_CARE_PROVIDER_SITE_OTHER): Payer: 59 | Admitting: Psychiatry

## 2015-01-02 VITALS — BP 122/64 | HR 84 | Ht 65.5 in | Wt 232.0 lb

## 2015-01-02 DIAGNOSIS — F39 Unspecified mood [affective] disorder: Secondary | ICD-10-CM

## 2015-01-02 DIAGNOSIS — F331 Major depressive disorder, recurrent, moderate: Secondary | ICD-10-CM

## 2015-01-02 DIAGNOSIS — F411 Generalized anxiety disorder: Secondary | ICD-10-CM

## 2015-01-02 MED ORDER — CITALOPRAM HYDROBROMIDE 40 MG PO TABS: 40.0000 mg | ORAL_TABLET | Freq: Every day | ORAL | Status: DC

## 2015-01-02 MED ORDER — ALPRAZOLAM 0.5 MG PO TABS: 0.5000 mg | ORAL_TABLET | Freq: Every evening | ORAL | Status: DC | PRN

## 2015-01-02 NOTE — Progress Notes (Signed)
Patient ID: Shannon [name], female   DOB: Mar 12, 1966, 49 y.o.   MRN: [id number]  [facility] visit  KENZLI BARRITT [id number] 49 y.o.  01/02/2015 11:22 AM  Chief Complaint:  Depression and feeling edgy  History of Present Illness:   Patient Presents for follow up and medication management for Major depression and Generalized anxiety disorder. In past she has  followed with Dr. Letta Moynahan psychiatrist office for many years. She has been on Lamictal and Zoloft. Recently her Zoloft was discontinued and she was started on that to [place]. Apparently she felt more agitation on the latuda so she called their office for a change. She was not getting a quick responses so she wanted to change provider.    Her son is 36 years of age but his dad has moved to [place]. He is having difficulty adjusting and is diagnosed with ADHD. 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  Last visit she continued to have crying spells and depression and anxiety. We increased the Celexa to 40 mg. Increase Lamictal to 200 mg. She has benefited from and she appears to be euthymic and is having a reactive affect she feels that medication has helped is no reported side effects. She has not used her FMLA.  She still has not talked to much about her divorce with the therapist, which has been a significant contributing factor to her depression.   Severity of depression. 7 out of 10. 10 being no depression.  Her other factors include difficult marriage that started 20 years ago leading to divorced. She now take care of her son.  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  She has been diagnosed with mood disorder there is no clear history of bipolar but she does get  irritable or edgy for some time but she feels it is out of frustration and depression    Past Psychiatric History/Hospitalization(s) Manged for depression and mood symptoms for more then 20 years.  Hospitalization for psychiatric illness: No History of Electroconvulsive Shock Therapy: No Prior Suicide Attempts: No  Medical History; Past Medical History  Diagnosis Date  . Hyperlipidemia   . Sleep apnea   . Anxiety   . Depression   . Diabetes mellitus without complication     Allergies: Allergies  Allergen Reactions  . Belviq [Lorcaserin Hcl]     Increased appetitie  . Contrave [Naltrexone-Bupropion Hcl Er]     Sleepy.  . Food     Big Red Gum  . Latuda [Lurasidone Hcl]     Severe aggitation    Medications: Outpatient Encounter Prescriptions as of 01/02/2015  Medication Sig  . ALPRAZolam (XANAX) 0.5 MG tablet Take 1 tablet (0.5 mg total) by mouth at bedtime as needed for anxiety.  . citalopram (CELEXA) 40 MG tablet Take 1 tablet (40 mg total) by mouth daily. Take one tablet of 61m qd.  . ibuprofen (ADVIL,MOTRIN) 800 MG tablet Take 1 tablet (800 mg total) by mouth every 8 (eight) hours as needed.  . lamoTRIgine (LAMICTAL) 200 MG tablet Take 1 tablet (200 mg total) by mouth daily.  .Marland Kitchenlevothyroxine (SYNTHROID, LEVOTHROID) 25 MCG tablet TAKE 1 TABLET (25 MCG TOTAL) BY MOUTH DAILY BEFORE BREAKFAST.  .Marland Kitchen  meloxicam (MOBIC) 15 MG tablet   . metFORMIN (GLUCOPHAGE) 500 MG tablet Take 1 tablet (500 mg total) by mouth 2 (two) times daily with a meal.  . simvastatin (ZOCOR) 20 MG tablet Take 1 tablet (20 mg total) by mouth at bedtime.  . [DISCONTINUED] ALPRAZolam (XANAX) 0.5 MG tablet Take 1 tablet (0.5 mg total) by mouth at bedtime as needed for anxiety.  . [DISCONTINUED] citalopram (CELEXA) 40 MG tablet Take one tablet and half. Total of 59m qd (Patient taking differently: 40 mg. Take one tablet and half. Total of 35mqd)     Substance Abuse History: Denies using illicit drugs,  alcohol or other substances.  Family History; Family History  Problem Relation Age of Onset  . Hyperlipidemia Mother   . Sleep apnea Mother   . Depression Mother   . Diabetes Father   . Hypertension Father   . COPD Father   . Diabetes Paternal Aunt   . Diabetes Paternal Uncle   . Alcohol abuse Paternal Uncle   . Alcohol abuse Paternal Grandfather   . Depression Sister       Labs:  Recent Results (from the past 2160 hour(s))  POCT HgB A1C     Status: None   Collection Time: 12/29/14  9:02 AM  Result Value Ref Range   Hemoglobin A1C 6.4   TSH     Status: None   Collection Time: 12/29/14  9:23 AM  Result Value Ref Range   TSH 1.779 0.350 - 4.500 uIU/mL  HIV antibody (with reflex)     Status: None   Collection Time: 12/29/14  9:23 AM  Result Value Ref Range   HIV 1&2 Ab, 4th Generation NONREACTIVE NONREACTIVE    Comment:   A NONREACTIVE HIV Ag/Ab result does not exclude HIV infection since the time frame for seroconversion is variable. If acute HIV infection is suspected, a HIV-1 RNA Qualitative TMA test is recommended.   HIV-1/2 Antibody Diff         Not indicated. HIV-1 RNA, Qual TMA           Not indicated.   PLEASE NOTE: This information has been disclosed to you from records whose confidentiality may be protected by state law. If your state requires such protection, then the state law prohibits you from making any further disclosure of the information without the specific written consent of the person to whom it pertains, or as otherwise permitted by law. A general authorization for the release of medical or other information is NOT sufficient for this purpose.   The performance of this assay has not been clinically validated in patients less than 2 85ears old.   COMPLETE METABOLIC PANEL WITH GFR     Status: None   Collection Time: 12/29/14  9:23 AM  Result Value Ref Range   Sodium 138 135 - 145 mEq/L   Potassium 4.5 3.5 - 5.3 mEq/L   Chloride 104 96 - 112  mEq/L   CO2 25 19 - 32 mEq/L   Glucose, Bld 89 70 - 99 mg/dL   BUN 12 6 - 23 mg/dL   Creat 0.62 0.50 - 1.10 mg/dL   Total Bilirubin 0.3 0.2 - 1.2 mg/dL   Alkaline Phosphatase 77 39 - 117 U/L   AST 17 0 - 37 U/L   ALT 20 0 - 35 U/L   Total Protein 6.4 6.0 - 8.3 g/dL   Albumin 3.9 3.5 - 5.2 g/dL   Calcium 8.7 8.4 - 10.5 mg/dL  GFR, Est African American >89 mL/min   GFR, Est Non African American >89 mL/min    Comment:   The estimated GFR is a calculation valid for adults (>=51 years old) that uses the CKD-EPI algorithm to adjust for age and sex. It is   not to be used for children, pregnant women, hospitalized patients,    patients on dialysis, or with rapidly changing kidney function. According to the NKDEP, eGFR >89 is normal, 60-89 shows mild impairment, 30-59 shows moderate impairment, 15-29 shows severe impairment and <15 is ESRD.     Lipid panel     Status: Abnormal   Collection Time: 12/29/14  9:25 AM  Result Value Ref Range   Cholesterol 157 0 - 200 mg/dL    Comment: ATP III Classification:       < 200        mg/dL        Desirable      200 - 239     mg/dL        Borderline High      >= 240        mg/dL        High      Triglycerides 160 (H) <150 mg/dL   HDL 33 (L) >=46 mg/dL    Comment: ** Please note change in reference range(s). **   Total CHOL/HDL Ratio 4.8 Ratio   VLDL 32 0 - 40 mg/dL   LDL Cholesterol 92 0 - 99 mg/dL    Comment:   Total Cholesterol/HDL Ratio:CHD Risk                        Coronary Heart Disease Risk Table                                        Men       Women          1/2 Average Risk              3.4        3.3              Average Risk              5.0        4.4           2X Average Risk              9.6        7.1           3X Average Risk             23.4       11.0 Use the calculated Patient Ratio above and the CHD Risk table  to determine the patient's CHD Risk. ATP III Classification (LDL):       < 100        mg/dL          Optimal      100 - 129     mg/dL         Near or Above Optimal      130 - 159     mg/dL         Borderline High      160 - 189     mg/dL         High       >  190        mg/dL         Very High     POCT UA - Microalbumin     Status: None   Collection Time: 12/29/14 10:45 AM  Result Value Ref Range   Microalbumin Ur, POC 10 mg/L   Creatinine, POC 100 mg/dL   Albumin/Creatinine Ratio, Urine, POC <30        Musculoskeletal: Strength & Muscle Tone: within normal limits Gait & Station: normal Patient leans: N/A  Mental Status Examination;   Psychiatric Specialty Exam: Physical Exam  Constitutional: She appears well-developed and well-nourished. No distress.  Skin: She is not diaphoretic.    Review of Systems  Constitutional: Negative.   Skin: Negative for rash.  Psychiatric/Behavioral: Negative for depression, suicidal ideas and substance abuse.    Blood pressure 122/64, pulse 84, height 5' 5.5" (1.664 m), weight 232 lb (105.235 kg).Body mass index is 38.01 kg/(m^2).  General Appearance: Casual  Eye Contact::  Fair  Speech:  Normal Rate  Volume:  Normal  Mood:  euthymic  Affect:  Congruent  Thought Process:  Coherent  Orientation:  Full (Time, Place, and Person)  Thought Content:  Rumination  Suicidal Thoughts:  No  Homicidal Thoughts:  No  Memory:  Immediate;   Fair Recent;   Fair  Judgement:  Fair  Insight:  Shallow  Psychomotor Activity:  Normal  Concentration:  Fair  Recall:  Fair  Akathisia:  Negative  Handed:  Right  AIMS (if indicated):     Assets:  Communication Skills Desire for Improvement Financial Resources/Insurance Housing  Sleep:        Assessment: Axis I: mood disorder unspecified. Rule out mood disorder secondary to general medical condition. Major depressive disorder recurrent moderate. Rule out panic disorder. Generalized anxiety disorder  Axis II: deferred  Axis III:  Past Medical History  Diagnosis Date  . Hyperlipidemia   .  Sleep apnea   . Anxiety   . Depression   . Diabetes mellitus without complication     Axis IV: psychosocial   Treatment Plan and Summary: continue Celexa to 40 mg. continue lamictal 233m qd.  Xanax 0.5 mg qd prn for anxiety and panic attacks.  Continue therapy   She is seeing or counselor that has been helping her psychosocial issues. She understands medication did not be the only modality of treatment considering her stressors related to psychosocial issue Pertinent Labs and Relevant Prior Notes reviewed. Medication Side effects, benefits and risks reviewed/discussed with Patient. Time given for patient to respond and asks questions regarding the Diagnosis and Medications. Safety concerns and to report to ER if suicidal or call 911. Relevant Medications refilled or called in to pharmacy. Discussed weight maintenance and Sleep Hygiene. Follow up with Primary care provider in regards to Medical conditions. Recommend compliance with medications and follow up office appointments. Discussed to avail opportunity to consider or/and continue Individual therapy with Counselor. Greater than 50% of time was spend in counseling and coordination of care with the patient.  Schedule for Follow up visit in 6  weeks or call in earlier as necessary.   AMerian Capron MD 01/02/2015

## 2015-01-03 ENCOUNTER — Ambulatory Visit: Payer: Self-pay

## 2015-01-03 ENCOUNTER — Other Ambulatory Visit: Payer: Self-pay | Admitting: *Deleted

## 2015-01-03 MED ORDER — AMBULATORY NON FORMULARY MEDICATION: Status: DC

## 2015-01-04 ENCOUNTER — Other Ambulatory Visit: Payer: Self-pay | Admitting: *Deleted

## 2015-01-04 MED ORDER — SIMVASTATIN 20 MG PO TABS: 20.0000 mg | ORAL_TABLET | Freq: Every day | ORAL | Status: DC

## 2015-01-04 MED ORDER — METFORMIN HCL 500 MG PO TABS: 500.0000 mg | ORAL_TABLET | Freq: Two times a day (BID) | ORAL | Status: DC

## 2015-01-04 MED ORDER — LEVOTHYROXINE SODIUM 25 MCG PO TABS: ORAL_TABLET | ORAL | Status: DC

## 2015-01-08 ENCOUNTER — Telehealth: Payer: Self-pay | Admitting: Physician Assistant

## 2015-01-08 NOTE — Telephone Encounter (Signed)
Received PA for Truetest strips and sent through cover my meds waiting on auth. - CF

## 2015-01-09 ENCOUNTER — Ambulatory Visit (HOSPITAL_COMMUNITY): Payer: Self-pay | Admitting: Licensed Clinical Social Worker

## 2015-01-11 ENCOUNTER — Ambulatory Visit (INDEPENDENT_AMBULATORY_CARE_PROVIDER_SITE_OTHER): Payer: 59

## 2015-01-11 DIAGNOSIS — Z1231 Encounter for screening mammogram for malignant neoplasm of breast: Secondary | ICD-10-CM | POA: Diagnosis not present

## 2015-01-18 ENCOUNTER — Other Ambulatory Visit: Payer: Self-pay

## 2015-01-18 NOTE — Patient Instructions (Addendum)
1. Plan to eat 30-45 GM (2-3) servings of carbohydrate a meal and 15 GM for snacks. 2. Plan to check blood sugars daily either fasting or 1 -2hrs after a meal.  Goals of 80-130 fasting and less than 140 after meals 3. Plan to walk dog for 40 minutes a day  4. Plan to complete EMMI programs by 02/18/2015 5. Plan to see Link to Wellness in April 26, 2015     Diabetes and Exercise Exercising regularly is important. It is not just about losing weight. It has many health benefits, such as:  Improving your overall fitness, flexibility, and endurance.  Increasing your bone density.  Helping with weight control.  Decreasing your body fat.  Increasing your muscle strength.  Reducing stress and tension.  Improving your overall health. People with diabetes who exercise gain additional benefits because exercise:  Reduces appetite.  Improves the body's use of blood sugar (glucose).  Helps lower or control blood glucose.  Decreases blood pressure.  Helps control blood lipids (such as cholesterol and triglycerides).  Improves the body's use of the hormone insulin by:  Increasing the body's insulin sensitivity.  Reducing the body's insulin needs.  Decreases the risk for heart disease because exercising:  Lowers cholesterol and triglycerides levels.  Increases the levels of good cholesterol (such as high-density lipoproteins [HDL]) in the body.  Lowers blood glucose levels. YOUR ACTIVITY PLAN  Choose an activity that you enjoy and set realistic goals. Your health care provider or diabetes educator can help you make an activity plan that works for you. Exercise regularly as directed by your health care provider. This includes:  Performing resistance training twice a week such as push-ups, sit-ups, lifting weights, or using resistance bands.  Performing 150 minutes of cardio exercises each week such as walking, running, or playing sports.  Staying active and spending no more than 90  minutes at one time being inactive. Even short bursts of exercise are good for you. Three 10-minute sessions spread throughout the day are just as beneficial as a single 30-minute session. Some exercise ideas include:  Taking the dog for a walk.  Taking the stairs instead of the elevator.  Dancing to your favorite song.  Doing an exercise video.  Doing your favorite exercise with a friend. RECOMMENDATIONS FOR EXERCISING WITH TYPE 1 OR TYPE 2 DIABETES   Check your blood glucose before exercising. If blood glucose levels are greater than 240 mg/dL, check for urine ketones. Do not exercise if ketones are present.  Avoid injecting insulin into areas of the body that are going to be exercised. For example, avoid injecting insulin into:  The arms when playing tennis.  The legs when jogging.  Keep a record of:  Food intake before and after you exercise.  Expected peak times of insulin action.  Blood glucose levels before and after you exercise.  The type and amount of exercise you have done.  Review your records with your health care provider. Your health care provider will help you to develop guidelines for adjusting food intake and insulin amounts before and after exercising.  If you take insulin or oral hypoglycemic agents, watch for signs and symptoms of hypoglycemia. They include:  Dizziness.  Shaking.  Sweating.  Chills.  Confusion.  Drink plenty of water while you exercise to prevent dehydration or heat stroke. Body water is lost during exercise and must be replaced.  Talk to your health care provider before starting an exercise program to make sure it is safe  for you. Remember, almost any type of activity is better than none. Document Released: 12/27/2003 Document Revised: 02/20/2014 Document Reviewed: 03/15/2013 [facility] Patient Information 2015 [place], [place]. This information is not intended to replace advice given to you by your health care provider. Make sure  you discuss any questions you have with your health care provider.

## 2015-01-18 NOTE — Patient Outreach (Addendum)
[facility]) Care Management   01/18/2015  Shannon [name] 02/03/1966 [id number]  Shannon [name] is an 49 y.o. female  Subjective: States that she learned a lot from her diabetes classes.  States that she has looked into having bariatric surgery.  States she did the online information session and she is ready to turn in her paperwork.  States that her blood sugars have been good and her cholesterol was much better when she had it drawn earlier this month.  Objective:   Review of Systems  All other systems reviewed and are negative.   Physical Exam  Filed Vitals:   01/18/15 0908  BP: 122/84  Pulse: 72     Current Medications:   Current Outpatient Prescriptions  Medication Sig Dispense Refill  . ALPRAZolam (XANAX) 0.5 MG tablet Take 1 tablet (0.5 mg total) by mouth at bedtime as needed for anxiety. 30 tablet 0  . AMBULATORY NON FORMULARY MEDICATION True Result test strips and lancets Dx: E11.9 100 each 3  . citalopram (CELEXA) 40 MG tablet Take 1 tablet (40 mg total) by mouth daily. Take one tablet of 40mg  qd. 30 tablet 0  . ibuprofen (ADVIL,MOTRIN) 800 MG tablet Take 1 tablet (800 mg total) by mouth every 8 (eight) hours as needed. 60 tablet 2  . lamoTRIgine (LAMICTAL) 200 MG tablet Take 1 tablet (200 mg total) by mouth daily. 30 tablet 0  . levothyroxine (SYNTHROID, LEVOTHROID) 25 MCG tablet TAKE 1 TABLET (25 MCG TOTAL) BY MOUTH DAILY BEFORE BREAKFAST. 30 tablet 1  . metFORMIN (GLUCOPHAGE) 500 MG tablet Take 1 tablet (500 mg total) by mouth 2 (two) times daily with a meal. 60 tablet 2  . simvastatin (ZOCOR) 20 MG tablet Take 1 tablet (20 mg total) by mouth at bedtime. 30 tablet 5  . meloxicam (MOBIC) 15 MG tablet   0   No current facility-administered medications for this visit.    Functional Status:   In your present state of health, do you have any difficulty performing the following activities: 01/18/2015  Is the patient deaf or have difficulty hearing? N  Hearing N   Vision N  Difficulty concentrating or making decisions N  Walking or climbing stairs? N  Doing errands, shopping? N    Fall/Depression Screening:    PHQ 2/9 Scores 01/18/2015 09/22/2014  PHQ - 2 Score 2 1  PHQ- 9 Score 8 -  Some encounter information is confidential and restricted. Go to Review Flowsheets activity to see all data.   [place]        Patient Outreach from 01/18/2015 in [place] Problem One  Potential for elevated blood sugars related to dx of Type 2 DM   Care Plan for Problem One  Active   Interventions for Problem One Long Term Goal  Reviewed carbohydrate counting and portion control, Discussed making  healthy choices when eating out, Instructed on importance of weight loss and how it can effect  her diabetes, Encourged to  continue to be evaluated for bariatric surgery   [facility] Long Term Goal (31-90 days)  Member will maintain hemoglobin A1C at or below 6.5 for the next 90 days   [facility] Long Term Goal Start Date  01/18/15   [facility] CM Short Term Goal #1 (0-30 days)  Member will complete EMMI programs by 02/18/15   [facility] CM Short Term Goal #1 Start Date  01/18/15       Assessment:  Presents to [employer] to [employer] for self management program for Type 2 DM.  Member has maintained hemoglobin A1C at 6.4.  She has made changes in her diet but her weight has been steady.  She needs to increase her activity and has plan to walk her dog more.  Plan: Plan to see for Link to Wellness follow up in 3 months  Fayne Mediate [place] [facility] [phone]

## 2015-01-23 ENCOUNTER — Ambulatory Visit (INDEPENDENT_AMBULATORY_CARE_PROVIDER_SITE_OTHER): Payer: 59 | Admitting: Licensed Clinical Social Worker

## 2015-01-23 DIAGNOSIS — F411 Generalized anxiety disorder: Secondary | ICD-10-CM

## 2015-01-23 NOTE — Psych (Signed)
   THERAPIST PROGRESS NOTE  Session Time: 9:00am-10am  Participation Level: Active  Behavioral Response: Disheveled Drowsy Euthymic    Type of Therapy: Individual Therapy  Treatment Goals addressed: Anxiety  Interventions: CBT  Therapist Interventions:  Gathered information about significant events and changes in mood and functioning since last seen about two months ago.   Discussed how worry initiates the fight or flight response.  Introduced a Theatre manager called a Editor, commissioning.  Explained that it is our interpretation of the situation that determines how we feel rather than the situation itself.  Emphasized that you don't have control of the thoughts that pop in your head, but you do have control over whether you hold onto those thoughts or let them go and replace them with more helpful thoughts.       Summary: Decided not to request FMLA for fear it may worsen her depression.  Work continues to be stressful as they continue to be short staffed.  Mood has improved.  PHQ-9 score is currently in the mild range.  Expressed a belief that medication is helping.      Already familiar with the concept of fight or flight.  Seemed to understand the concepts discussed.  With help from therapist was able to complete an entry in the Thought Log.  Indicated she plans to practice using the Thought Log.  Continues to worry much of the day.       Suicidal/Homicidal: Denied both    Plan: Next session scheduled in approximately 2 weeks.  Will review Thought Journal if patient brings it in.  May go over some examples of common unhelpful thinking patterns.  Diagnosis: Generalized Anxiety Disorder       Armandina Stammer 01/23/2015

## 2015-01-30 ENCOUNTER — Telehealth: Payer: Self-pay | Admitting: *Deleted

## 2015-01-30 NOTE — Telephone Encounter (Signed)
Patient called stating she needs letter of medical necessity for  [facility], She decided on Sleve vs the Lap Band She would like to come by on Thursday to pick up letter to drop off all her paper work

## 2015-01-31 ENCOUNTER — Encounter: Payer: Self-pay | Admitting: *Deleted

## 2015-01-31 NOTE — Telephone Encounter (Signed)
Pt has tried and failed belviq and contrave. She did not lose weight on phentermine. She has tried exercise for over a year. She has DM, hyperlipidemia and Lumbar DDD all which weight loss would help with. In medical opinion pt would greatly benefit from weight loss surgery.

## 2015-01-31 NOTE — Telephone Encounter (Signed)
Letter written

## 2015-02-06 ENCOUNTER — Ambulatory Visit (INDEPENDENT_AMBULATORY_CARE_PROVIDER_SITE_OTHER): Payer: 59 | Admitting: Psychiatry

## 2015-02-06 ENCOUNTER — Encounter (HOSPITAL_COMMUNITY): Payer: Self-pay | Admitting: Psychiatry

## 2015-02-06 ENCOUNTER — Ambulatory Visit (INDEPENDENT_AMBULATORY_CARE_PROVIDER_SITE_OTHER): Payer: 59 | Admitting: Licensed Clinical Social Worker

## 2015-02-06 VITALS — BP 118/70 | HR 84 | Ht 65.5 in | Wt 233.0 lb

## 2015-02-06 DIAGNOSIS — F39 Unspecified mood [affective] disorder: Secondary | ICD-10-CM

## 2015-02-06 DIAGNOSIS — F331 Major depressive disorder, recurrent, moderate: Secondary | ICD-10-CM | POA: Diagnosis not present

## 2015-02-06 DIAGNOSIS — F410 Panic disorder [episodic paroxysmal anxiety] without agoraphobia: Secondary | ICD-10-CM

## 2015-02-06 DIAGNOSIS — F411 Generalized anxiety disorder: Secondary | ICD-10-CM

## 2015-02-06 DIAGNOSIS — F0630 Mood disorder due to known physiological condition, unspecified: Secondary | ICD-10-CM

## 2015-02-06 MED ORDER — LAMOTRIGINE 200 MG PO TABS: 200.0000 mg | ORAL_TABLET | Freq: Every day | ORAL | Status: DC

## 2015-02-06 MED ORDER — CITALOPRAM HYDROBROMIDE 40 MG PO TABS: 40.0000 mg | ORAL_TABLET | Freq: Every day | ORAL | Status: DC

## 2015-02-06 NOTE — Psych (Signed)
   THERAPIST PROGRESS NOTE  Session Time: 10am-11am  Participation Level: Active  Behavioral Response: Fairly Groomed Drowsy Euthymic    Type of Therapy: Individual Therapy  Treatment Goals addressed: Anxiety  Interventions: CBT  Therapist Interventions:  Reviewed a list of unhelpful thinking styles.  Asked patient to comment on which ones she could relate to.   Introduced the process of Socratic questioning to counter negative self-talk. Emphasized that even if you have been using unhelpful thinking styles for a long time you can learn to change it, but naturally it takes some time with practice.   Discussed how thinking patterns can change as a result of significant life experiences.          Summary: Indicated she could relate to most of the unhelpful thinking styles.  Was able to provide some examples.   Acknowledged that she has been using those thinking patterns for a long time.  Reported that she believes it is possible for her to learn to change her thinking.   Reflected on how negative experiences in her marriage have changed her thinking to be less trusting.   Expressed satisfaction with how she is coping with anxiety.  Continues to worry quite a bit though.       Suicidal/Homicidal: Denied both    Plan: Next session scheduled in approximately 2 weeks.      Diagnosis: Generalized Anxiety Disorder       Armandina Stammer 02/06/2015

## 2015-02-06 NOTE — Progress Notes (Signed)
Patient ID: Shannon [name], female   DOB: 10-24-1965, 49 y.o.   MRN: [id number]  [facility] Follow up visit  Shannon [name] [id number] 49 y.o.  02/06/2015 10:05 AM  Chief Complaint:  Depression follow up  History of Present Illness:   Patient Presents for follow up and medication management for Major depression and Generalized anxiety disorder. In past she has  followed with Dr. Letta Moynahan psychiatrist office for many years. She has been on Lamictal and Zoloft. Recently her Zoloft was discontinued and she was started on that to [facility]. Apparently she felt more agitation on the latuda so she called their office for a change. She was not getting a quick responses so she wanted to change provider.   She is benefitting from increased dose of celexa and lamictal. She has benefited from and she appears to be euthymic and is having a reactive affect she feels that medication has helped is no reported side effects. She has not used her FMLA. She has seldom used her xanax.  She has gone thru divorce, which has been a significant contributing factor to her depression. She has appointment today with therapist.  Severity of depression. 7 out of 10. 10 being no depression.  Her other factors include difficult marriage that started 20 years ago leading to divorced. She now take care of her son.  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  She has been diagnosed with mood disorder there is no clear history of bipolar but she does get irritable or edgy for some time but she feels it is out of frustration and depression    Past Psychiatric History/Hospitalization(s) Manged for depression and mood symptoms for more then 20 years.  Hospitalization for psychiatric illness: No History of Electroconvulsive Shock Therapy: No Prior Suicide Attempts: No  Medical History; Past Medical History  Diagnosis Date  . Hyperlipidemia   . Sleep apnea    . Anxiety   . Depression   . Diabetes mellitus without complication     Allergies: Allergies  Allergen Reactions  . Belviq [Lorcaserin Hcl]     Increased appetitie  . Contrave [Naltrexone-Bupropion Hcl Er]     Sleepy.  . Food     Big Red Gum  . Latuda [Lurasidone Hcl]     Severe aggitation  . Lipitor [Atorvastatin] Other (See Comments)    Muscle aches    Medications: Outpatient Encounter Prescriptions as of 02/06/2015  Medication Sig  . ALPRAZolam (XANAX) 0.5 MG tablet Take 1 tablet (0.5 mg total) by mouth at bedtime as needed for anxiety.  . AMBULATORY NON FORMULARY MEDICATION True Result test strips and lancets Dx: E11.9  . citalopram (CELEXA) 40 MG tablet Take 1 tablet (40 mg total) by mouth daily. Take one tablet of 46m qd.  . ibuprofen (ADVIL,MOTRIN) 800 MG tablet Take 1 tablet (800 mg total) by mouth every 8 (eight) hours as needed.  . lamoTRIgine (LAMICTAL) 200 MG tablet Take 1 tablet (200 mg total) by mouth daily.  .Marland Kitchenlevothyroxine (SYNTHROID, LEVOTHROID) 25 MCG tablet TAKE 1 TABLET (25 MCG TOTAL) BY MOUTH DAILY BEFORE BREAKFAST.  . meloxicam (MOBIC) 15 MG tablet   . metFORMIN (GLUCOPHAGE) 500 MG tablet Take 1 tablet (500 mg total) by mouth 2 (two) times daily with a meal.  . [DISCONTINUED] citalopram (CELEXA) 40 MG tablet Take 1 tablet (40 mg total) by mouth daily. Take one tablet of 478mqd.  . [DISCONTINUED] lamoTRIgine (  LAMICTAL) 200 MG tablet Take 1 tablet (200 mg total) by mouth daily.  . simvastatin (ZOCOR) 20 MG tablet Take 1 tablet (20 mg total) by mouth at bedtime.     Substance Abuse History: Denies using illicit drugs, alcohol or other substances.  Family History; Family History  Problem Relation Age of Onset  . Hyperlipidemia Mother   . Sleep apnea Mother   . Depression Mother   . Diabetes Father   . Hypertension Father   . COPD Father   . Diabetes Paternal Aunt   . Diabetes Paternal Uncle   . Alcohol abuse Paternal Uncle   . Alcohol abuse  Paternal Grandfather   . Depression Sister       Labs:  Recent Results (from the past 2160 hour(s))  POCT HgB A1C     Status: None   Collection Time: 12/29/14  9:02 AM  Result Value Ref Range   Hemoglobin A1C 6.4   TSH     Status: None   Collection Time: 12/29/14  9:23 AM  Result Value Ref Range   TSH 1.779 0.350 - 4.500 uIU/mL  HIV antibody (with reflex)     Status: None   Collection Time: 12/29/14  9:23 AM  Result Value Ref Range   HIV 1&2 Ab, 4th Generation NONREACTIVE NONREACTIVE    Comment:   A NONREACTIVE HIV Ag/Ab result does not exclude HIV infection since the time frame for seroconversion is variable. If acute HIV infection is suspected, a HIV-1 RNA Qualitative TMA test is recommended.   HIV-1/2 Antibody Diff         Not indicated. HIV-1 RNA, Qual TMA           Not indicated.   PLEASE NOTE: This information has been disclosed to you from records whose confidentiality may be protected by state law. If your state requires such protection, then the state law prohibits you from making any further disclosure of the information without the specific written consent of the person to whom it pertains, or as otherwise permitted by law. A general authorization for the release of medical or other information is NOT sufficient for this purpose.   The performance of this assay has not been clinically validated in patients less than 30 years old.   COMPLETE METABOLIC PANEL WITH GFR     Status: None   Collection Time: 12/29/14  9:23 AM  Result Value Ref Range   Sodium 138 135 - 145 mEq/L   Potassium 4.5 3.5 - 5.3 mEq/L   Chloride 104 96 - 112 mEq/L   CO2 25 19 - 32 mEq/L   Glucose, Bld 89 70 - 99 mg/dL   BUN 12 6 - 23 mg/dL   Creat 0.62 0.50 - 1.10 mg/dL   Total Bilirubin 0.3 0.2 - 1.2 mg/dL   Alkaline Phosphatase 77 39 - 117 U/L   AST 17 0 - 37 U/L   ALT 20 0 - 35 U/L   Total Protein 6.4 6.0 - 8.3 g/dL   Albumin 3.9 3.5 - 5.2 g/dL   Calcium 8.7 8.4 - 10.5 mg/dL    GFR, Est African American >89 mL/min   GFR, Est Non African American >89 mL/min    Comment:   The estimated GFR is a calculation valid for adults (>=64 years old) that uses the CKD-EPI algorithm to adjust for age and sex. It is   not to be used for children, pregnant women, hospitalized patients,    patients on dialysis, or with rapidly  changing kidney function. According to the NKDEP, eGFR >89 is normal, 60-89 shows mild impairment, 30-59 shows moderate impairment, 15-29 shows severe impairment and <15 is ESRD.     Lipid panel     Status: Abnormal   Collection Time: 12/29/14  9:25 AM  Result Value Ref Range   Cholesterol 157 0 - 200 mg/dL    Comment: ATP III Classification:       < 200        mg/dL        Desirable      200 - 239     mg/dL        Borderline High      >= 240        mg/dL        High      Triglycerides 160 (H) <150 mg/dL   HDL 33 (L) >=46 mg/dL    Comment: ** Please note change in reference range(s). **   Total CHOL/HDL Ratio 4.8 Ratio   VLDL 32 0 - 40 mg/dL   LDL Cholesterol 92 0 - 99 mg/dL    Comment:   Total Cholesterol/HDL Ratio:CHD Risk                        Coronary Heart Disease Risk Table                                        Men       Women          1/2 Average Risk              3.4        3.3              Average Risk              5.0        4.4           2X Average Risk              9.6        7.1           3X Average Risk             23.4       11.0 Use the calculated Patient Ratio above and the CHD Risk table  to determine the patient's CHD Risk. ATP III Classification (LDL):       < 100        mg/dL         Optimal      100 - 129     mg/dL         Near or Above Optimal      130 - 159     mg/dL         Borderline High      160 - 189     mg/dL         High       > 190        mg/dL         Very High     POCT UA - Microalbumin     Status: None   Collection Time: 12/29/14 10:45 AM  Result Value Ref Range   Microalbumin Ur, POC 10 mg/L    Creatinine, POC 100 mg/dL   Albumin/Creatinine Ratio,  Urine, POC <30        Musculoskeletal: Strength & Muscle Tone: within normal limits Gait & Station: normal Patient leans: N/A  Mental Status Examination;   Psychiatric Specialty Exam: Physical Exam  Constitutional: She appears well-developed and well-nourished. No distress.  Skin: She is not diaphoretic.    Review of Systems  Cardiovascular: Negative for chest pain.  Skin: Negative for rash.  Neurological: Negative for headaches.  Psychiatric/Behavioral: Negative for suicidal ideas and substance abuse.    Blood pressure 118/70, pulse 84, height 5' 5.5" (1.664 m), weight 233 lb (105.688 kg).Body mass index is 38.17 kg/(m^2).  General Appearance: Casual  Eye Contact::  Fair  Speech:  Normal Rate  Volume:  Normal  Mood:  euthymic  Affect:  Congruent  Thought Process:  Coherent  Orientation:  Full (Time, Place, and Person)  Thought Content:  Rumination  Suicidal Thoughts:  No  Homicidal Thoughts:  No  Memory:  Immediate;   Fair Recent;   Fair  Judgement:  Fair  Insight:  Shallow  Psychomotor Activity:  Normal  Concentration:  Fair  Recall:  Fair  Akathisia:  Negative  Handed:  Right  AIMS (if indicated):     Assets:  Communication Skills Desire for Improvement Financial Resources/Insurance Housing  Sleep:        Assessment: Axis I: mood disorder unspecified. Rule out mood disorder secondary to general medical condition. Major depressive disorder recurrent moderate. Rule out panic disorder. Generalized anxiety disorder  Axis II: deferred  Axis III:  Past Medical History  Diagnosis Date  . Hyperlipidemia   . Sleep apnea   . Anxiety   . Depression   . Diabetes mellitus without complication     Axis IV: psychosocial   Treatment Plan and Summary: continue Celexa to 40 mg. continue lamictal 249m qd.  Xanax 0.5 mg qd prn for anxiety and panic attacks.  Continue therapy   She is seeing or  counselor that has been helping her psychosocial issues. She understands medication did not be the only modality of treatment considering her stressors related to psychosocial issue Pertinent Labs and Relevant Prior Notes reviewed. Medication Side effects, benefits and risks reviewed/discussed with Patient. Time given for patient to respond and asks questions regarding the Diagnosis and Medications. Safety concerns and to report to ER if suicidal or call 911. Relevant Medications refilled or called in to pharmacy. Discussed weight maintenance and Sleep Hygiene. Follow up with Primary care provider in regards to Medical conditions. Recommend compliance with medications and follow up office appointments. Discussed to avail opportunity to consider or/and continue Individual therapy with Counselor. Greater than 50% of time was spend in counseling and coordination of care with the patient.  Schedule for Follow up visit in 8  weeks or call in earlier as necessary.   AMerian Capron MD 02/06/2015

## 2015-02-08 NOTE — Telephone Encounter (Signed)
Test strips denied they will only cover these strips after patient has tried Accucheck and one touch - CF

## 2015-02-09 ENCOUNTER — Other Ambulatory Visit: Payer: Self-pay | Admitting: *Deleted

## 2015-02-09 MED ORDER — AMBULATORY NON FORMULARY MEDICATION: Status: DC

## 2015-02-20 ENCOUNTER — Ambulatory Visit (HOSPITAL_COMMUNITY): Payer: Self-pay | Admitting: Licensed Clinical Social Worker

## 2015-02-23 ENCOUNTER — Encounter: Payer: Self-pay | Admitting: Gastroenterology

## 2015-03-02 ENCOUNTER — Other Ambulatory Visit: Payer: Self-pay | Admitting: General Surgery

## 2015-03-05 ENCOUNTER — Ambulatory Visit: Payer: Self-pay | Admitting: Sports Medicine

## 2015-03-14 ENCOUNTER — Ambulatory Visit: Payer: Self-pay

## 2015-03-16 ENCOUNTER — Ambulatory Visit (INDEPENDENT_AMBULATORY_CARE_PROVIDER_SITE_OTHER): Payer: 59 | Admitting: Licensed Clinical Social Worker

## 2015-03-16 DIAGNOSIS — F411 Generalized anxiety disorder: Secondary | ICD-10-CM | POA: Insufficient documentation

## 2015-03-16 NOTE — Progress Notes (Signed)
  THERAPIST PROGRESS NOTE  Session Time: 11:00am-11:40am  Participation Level: Active  Behavioral Response: Casual   Alert  Euthymic   Type of Therapy: Individual Therapy  Treatment Goals addressed: Anxiety  Interventions: Supportive and Solution Focused  Therapist Interventions:  Had patient complete a GAD-7 to assess for generalized anxiety.  Discussed coping strategies patient has been using to effectively manage stress.  Discussed how patient can best adjust to having time without her son now that his father has moved back into town.      Summary:   Score on the GAD-7 was 7.  This is an improvement.  She indicated that symptoms have been less severe and much less frequent.   Talked about making the decision to get rid of the puppy they have had for the past several months.  Found that it was too stressful to care for him.  He required a lot of attention and had a habit of biting the other animals in the house.   Ex moved back to town about 2 weeks ago.  Their son has been staying with him.  Reports she has felt a bit lonely but she has been taking advantage of the "me time" to get extra rest and accomplish more things around the house.   Continues to express satisfaction with how she is coping with anxiety.       Suicidal/Homicidal: Denied both    Plan: Next session scheduled in approximately one month.     Diagnosis:Generalized Anxiety Disorder

## 2015-04-03 ENCOUNTER — Encounter (HOSPITAL_COMMUNITY)
Admission: RE | Disposition: A | Discharge status: Home / Self Care | Payer: 59 | Source: Ambulatory Visit | Attending: General Surgery

## 2015-04-03 ENCOUNTER — Ambulatory Visit (HOSPITAL_COMMUNITY)
Admission: RE | Admit: 2015-04-03 | Discharge: 2015-04-03 | Disposition: A | Discharge status: Home / Self Care | Payer: 59 | Source: Ambulatory Visit | Attending: General Surgery | Admitting: General Surgery

## 2015-04-03 HISTORY — PX: BREATH TEK H PYLORI: SHX5422

## 2015-04-03 SURGERY — BREATH TEST, FOR HELICOBACTER PYLORI

## 2015-04-03 NOTE — Progress Notes (Signed)
   04/03/15 [place]  Referring MD Greer Pickerel  Time of Last PO Intake 2330  Baseline Breath At: 8022  Pranactin Given At: 1798  Post-Dose Breath At: 0754  Sample 1 1.5  Sample 2 1.6  Test Negative

## 2015-04-04 ENCOUNTER — Encounter (HOSPITAL_COMMUNITY): Payer: Self-pay | Admitting: General Surgery

## 2015-04-05 ENCOUNTER — Ambulatory Visit (HOSPITAL_COMMUNITY)
Admission: RE | Admit: 2015-04-05 | Discharge: 2015-04-05 | Disposition: A | Discharge status: Home / Self Care | Payer: 59 | Source: Ambulatory Visit | Attending: General Surgery | Admitting: General Surgery

## 2015-04-05 ENCOUNTER — Other Ambulatory Visit: Payer: Self-pay

## 2015-04-05 DIAGNOSIS — G4730 Sleep apnea, unspecified: Secondary | ICD-10-CM | POA: Insufficient documentation

## 2015-04-05 DIAGNOSIS — E039 Hypothyroidism, unspecified: Secondary | ICD-10-CM | POA: Insufficient documentation

## 2015-04-05 DIAGNOSIS — G4733 Obstructive sleep apnea (adult) (pediatric): Secondary | ICD-10-CM | POA: Diagnosis not present

## 2015-04-05 DIAGNOSIS — K449 Diaphragmatic hernia without obstruction or gangrene: Secondary | ICD-10-CM | POA: Diagnosis not present

## 2015-04-05 DIAGNOSIS — F39 Unspecified mood [affective] disorder: Secondary | ICD-10-CM | POA: Diagnosis not present

## 2015-04-05 DIAGNOSIS — Z6838 Body mass index (BMI) 38.0-38.9, adult: Secondary | ICD-10-CM | POA: Insufficient documentation

## 2015-04-05 DIAGNOSIS — E119 Type 2 diabetes mellitus without complications: Secondary | ICD-10-CM | POA: Diagnosis not present

## 2015-04-05 DIAGNOSIS — M5136 Other intervertebral disc degeneration, lumbar region: Secondary | ICD-10-CM | POA: Diagnosis not present

## 2015-04-11 ENCOUNTER — Ambulatory Visit (HOSPITAL_COMMUNITY): Payer: Self-pay | Admitting: Psychiatry

## 2015-04-11 ENCOUNTER — Ambulatory Visit (INDEPENDENT_AMBULATORY_CARE_PROVIDER_SITE_OTHER): Payer: 59 | Admitting: Licensed Clinical Social Worker

## 2015-04-12 ENCOUNTER — Encounter: Payer: 59 | Attending: General Surgery | Admitting: Dietician

## 2015-04-12 ENCOUNTER — Encounter: Payer: Self-pay | Admitting: Dietician

## 2015-04-12 VITALS — Ht 65.0 in | Wt 237.4 lb

## 2015-04-12 DIAGNOSIS — E669 Obesity, unspecified: Secondary | ICD-10-CM | POA: Insufficient documentation

## 2015-04-12 DIAGNOSIS — Z713 Dietary counseling and surveillance: Secondary | ICD-10-CM | POA: Insufficient documentation

## 2015-04-12 DIAGNOSIS — Z6838 Body mass index (BMI) 38.0-38.9, adult: Secondary | ICD-10-CM | POA: Diagnosis not present

## 2015-04-12 NOTE — Patient Instructions (Signed)

## 2015-04-12 NOTE — Progress Notes (Signed)
  Pre-Op Assessment Visit:  Pre-Operative Sleeve Gastrectomy Surgery  Medical Nutrition Therapy:  Appt start time: 1000   End time:  1610.  Patient was seen on 04/12/2015 for Pre-Operative Nutrition Assessment. Assessment and letter of approval faxed to [facility] Bariatric Surgery Program coordinator on 04/12/2015.   Preferred Learning Style:   No preference indicated   Learning Readiness:   Ready  Handouts given during visit include:  Pre-Op Goals Bariatric Surgery Protein Shakes   During the appointment today the following Pre-Op Goals were reviewed with the patient: Maintain or lose weight as instructed by your surgeon Make healthy food choices Begin to limit portion sizes Limited concentrated sugars and fried foods Keep fat/sugar in the single digits per serving on   food labels Practice CHEWING your food  (aim for 30 chews per bite or until applesauce consistency) Practice not drinking 15 minutes before, during, and 30 minutes after each meal/snack Avoid all carbonated beverages  Avoid/limit caffeinated beverages  Avoid all sugar-sweetened beverages Consume 3 meals per day; eat every 3-5 hours Make a list of non-food related activities Aim for 64-100 ounces of FLUID daily  Aim for at least 60-80 grams of PROTEIN daily Look for a liquid protein source that contain ?15 g protein and ?5 g carbohydrate  (ex: shakes, drinks, shots)  Patient-Centered Goals: Goals: get rid of CPAP machine, improve/reverse diabetes, increase energy, improve back pain  9 level of confidence/10 level of importance   Demonstrated degree of understanding via:  Teach Back  Teaching Method Utilized:  Visual Auditory Hands on  Barriers to learning/adherence to lifestyle change: none  Patient to call the [facility] to enroll in Pre-Op and Post-Op Nutrition Education when surgery date is scheduled.

## 2015-04-13 ENCOUNTER — Ambulatory Visit (HOSPITAL_COMMUNITY): Payer: Self-pay | Admitting: Licensed Clinical Social Worker

## 2015-04-26 ENCOUNTER — Ambulatory Visit: Payer: Self-pay

## 2015-04-30 ENCOUNTER — Other Ambulatory Visit: Payer: Self-pay | Admitting: Physician Assistant

## 2015-05-02 ENCOUNTER — Other Ambulatory Visit: Payer: Self-pay

## 2015-05-02 ENCOUNTER — Ambulatory Visit (INDEPENDENT_AMBULATORY_CARE_PROVIDER_SITE_OTHER): Payer: 59 | Admitting: Licensed Clinical Social Worker

## 2015-05-02 NOTE — Patient Outreach (Signed)
[facility]) Care Management  05/02/2015  Shannon [name] 1966-08-10 [id number]   Call to member to reschedule missed appointment on 04/26/15.   Member states she has been approved to have bariatric surgery and she plans to have the sleeve surgery.  States that she does not have a date set up yet but expects it to be in the next week or so. Scheduled appointment for 06/14/15 which will be after her surgery. Peter Garter RN, [facility] Care Management Coordinator-Link to [facility] [phone]

## 2015-05-03 ENCOUNTER — Ambulatory Visit (INDEPENDENT_AMBULATORY_CARE_PROVIDER_SITE_OTHER): Payer: 59 | Admitting: Psychiatry

## 2015-05-03 ENCOUNTER — Encounter (HOSPITAL_COMMUNITY): Payer: Self-pay | Admitting: Psychiatry

## 2015-05-03 VITALS — BP 135/79 | HR 80 | Wt 235.0 lb

## 2015-05-03 DIAGNOSIS — F410 Panic disorder [episodic paroxysmal anxiety] without agoraphobia: Secondary | ICD-10-CM

## 2015-05-03 DIAGNOSIS — F331 Major depressive disorder, recurrent, moderate: Secondary | ICD-10-CM | POA: Diagnosis not present

## 2015-05-03 DIAGNOSIS — F341 Dysthymic disorder: Secondary | ICD-10-CM

## 2015-05-03 DIAGNOSIS — F411 Generalized anxiety disorder: Secondary | ICD-10-CM

## 2015-05-03 DIAGNOSIS — F39 Unspecified mood [affective] disorder: Secondary | ICD-10-CM

## 2015-05-03 MED ORDER — CITALOPRAM HYDROBROMIDE 40 MG PO TABS: 40.0000 mg | ORAL_TABLET | Freq: Every day | ORAL | Status: DC

## 2015-05-03 MED ORDER — LAMOTRIGINE 200 MG PO TABS: 200.0000 mg | ORAL_TABLET | Freq: Every day | ORAL | Status: DC

## 2015-05-03 MED ORDER — ALPRAZOLAM 0.5 MG PO TABS: 0.5000 mg | ORAL_TABLET | Freq: Every evening | ORAL | Status: DC | PRN

## 2015-05-03 NOTE — Progress Notes (Signed)
Patient ID: Shannon [name], female   DOB: [birth year], 49 y.o.   MRN: [id number]  [facility] Follow up visit  Shannon [name] [id number] 49 y.o.  05/03/2015 9:11 AM  Chief Complaint:  Depression follow up  History of Present Illness:   Patient Presents for follow up and medication management for Major depression and Generalized anxiety disorder. In past she has  followed with Dr. Letta Moynahan psychiatrist office for many years. She has been on Lamictal and Zoloft. Recently her Zoloft was discontinued and she was started on that to Shannon [name]. Apparently she felt more agitation on the latuda so she called their office for a change. She was not getting a quick responses so she wanted to change provider.  She continues to benefit from Lamictal, Celexa. She is not endorsing frequent panic attacks. Depression was states doing reasonable. She is worried about her expected surgery for bariatric surgery in the next 1 month. She uses Xanax infrequently  She has gone thru divorce, which has been a significant contributing factor to her depression. She has appointment today with therapist.  Severity of depression. 7 out of 10. 10 being no depression.  Her other factors include difficult marriage that started 20 years ago leading to divorced. She now take care of her son.  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  She has been diagnosed with mood disorder there is no clear history of bipolar but she does get irritable or edgy for some time but she feels it is out of frustration and depression    Past Psychiatric History/Hospitalization(s) Manged for depression and mood symptoms for more then 20 years.  Prior Suicide Attempts: No  Medical History; Past Medical History  Diagnosis Date  . Hyperlipidemia   . Sleep apnea   . Anxiety   . Depression   . Diabetes mellitus without complication     Allergies: Allergies  Allergen Reactions   . Belviq [Lorcaserin Hcl]     Increased appetitie  . Contrave [Naltrexone-Bupropion Hcl Er]     Sleepy.  . Food     Big Red Gum  . Latuda [Lurasidone Hcl]     Severe aggitation  . Lipitor [Atorvastatin] Other (See Comments)    Muscle aches    Medications: Outpatient Encounter Prescriptions as of 05/03/2015  Medication Sig  . ALPRAZolam (XANAX) 0.5 MG tablet Take 1 tablet (0.5 mg total) by mouth at bedtime as needed for anxiety.  . AMBULATORY NON FORMULARY MEDICATION AccuCheck One Touch test strips and lancets Dx: E11.9  . citalopram (CELEXA) 40 MG tablet Take 1 tablet (40 mg total) by mouth daily. Take one tablet of 40mg  qd.  . ibuprofen (ADVIL,MOTRIN) 800 MG tablet Take 1 tablet (800 mg total) by mouth every 8 (eight) hours as needed.  . lamoTRIgine (LAMICTAL) 200 MG tablet Take 1 tablet (200 mg total) by mouth daily.  Marland Kitchen levothyroxine (SYNTHROID, LEVOTHROID) 25 MCG tablet TAKE 1 TABLET (25 MCG TOTAL) BY MOUTH DAILY BEFORE BREAKFAST.  . meloxicam (MOBIC) 15 MG tablet   . metFORMIN (GLUCOPHAGE) 500 MG tablet Take 1 tablet (500 mg total) by mouth 2 (two) times daily with a meal.  . simvastatin (ZOCOR) 20 MG tablet Take 1 tablet (20 mg total) by mouth at bedtime.  . [DISCONTINUED] ALPRAZolam (XANAX) 0.5 MG tablet Take 1 tablet (0.5 mg total) by mouth at bedtime as needed for anxiety.  . [DISCONTINUED] citalopram (CELEXA) 40 MG tablet Take 1  tablet (40 mg total) by mouth daily. Take one tablet of 40mg  qd.  . [DISCONTINUED] lamoTRIgine (LAMICTAL) 200 MG tablet Take 1 tablet (200 mg total) by mouth daily.   No facility-administered encounter medications on file as of 05/03/2015.     Family History; Family History  Problem Relation Age of Onset  . Hyperlipidemia Mother   . Sleep apnea Mother   . Depression Mother   . Diabetes Father   . Hypertension Father   . COPD Father   . Diabetes Paternal Aunt   . Diabetes Paternal Uncle   . Alcohol abuse Paternal Uncle   . Alcohol abuse  Paternal Grandfather   . Depression Sister       Labs:  No results found for this or any previous visit (from the past 2160 hour(s)).     Musculoskeletal: Strength & Muscle Tone: within normal limits Gait & Station: normal Patient leans: N/A  Mental Status Examination;   Psychiatric Specialty Exam: Physical Exam  Constitutional: She appears well-developed and well-nourished. No distress.  Skin: She is not diaphoretic.    Review of Systems  Cardiovascular: Negative for chest pain.  Skin: Negative for itching and rash.  Neurological: Negative for tremors and headaches.  Psychiatric/Behavioral: Negative for depression, suicidal ideas and substance abuse.    Blood pressure 135/79, pulse 80, weight 235 lb (106.595 kg), last menstrual period 03/26/2015.Body mass index is 39.11 kg/(m^2).  General Appearance: Casual  Eye Contact::  Fair  Speech:  Normal Rate  Volume:  Normal  Mood:  euthymic  Affect:  Congruent  Thought Process:  Coherent  Orientation:  Full (Time, Place, and Person)  Thought Content:  Rumination  Suicidal Thoughts:  No  Homicidal Thoughts:  No  Memory:  Immediate;   Fair Recent;   Fair  Judgement:  Fair  Insight:  Shallow  Psychomotor Activity:  Normal  Concentration:  Fair  Recall:  Fair  Akathisia:  Negative  Handed:  Right  AIMS (if indicated):     Assets:  Communication Skills Desire for Improvement Financial Resources/Insurance Housing  Sleep:        Assessment: Axis I: mood disorder unspecified. Rule out mood disorder secondary to general medical condition. Major depressive disorder recurrent moderate. Rule out panic disorder. Generalized anxiety disorder  Axis II: deferred  Axis III:  Past Medical History  Diagnosis Date  . Hyperlipidemia   . Sleep apnea   . Anxiety   . Depression   . Diabetes mellitus without complication     Axis IV: psychosocial   Treatment Plan and Summary: continue Celexa to 40 mg. For depression,  anxiety continue lamictal 200mg  qd.  Xanax 0.5 mg qd prn for anxiety and panic attacks.  Continue therapy  She is a non-smoker Pertinent Labs and Relevant Prior Notes reviewed. Medication Side effects, benefits and risks reviewed/discussed with Patient. Time given for patient to respond and asks questions regarding the Diagnosis and Medications. Safety concerns and to report to ER if suicidal or call 911. Relevant Medications refilled or called in to pharmacy. Discussed weight maintenance and Sleep Hygiene. Follow up with Primary care provider in regards to Medical conditions. Recommend compliance with medications and follow up office appointments. Discussed to avail opportunity to consider or/and continue Individual therapy with Counselor. Greater than 50% of time was spend in counseling and coordination of care with the patient.  Schedule for Follow up visit in 8  weeks or call in earlier as necessary. She can come early after bariatric surgery.  Merian Capron, MD 05/03/2015

## 2015-06-14 ENCOUNTER — Ambulatory Visit: Payer: Self-pay

## 2015-06-18 ENCOUNTER — Other Ambulatory Visit: Payer: Self-pay | Admitting: Physician Assistant

## 2015-07-05 ENCOUNTER — Ambulatory Visit (INDEPENDENT_AMBULATORY_CARE_PROVIDER_SITE_OTHER): Payer: 59 | Admitting: Psychiatry

## 2015-07-05 ENCOUNTER — Encounter (HOSPITAL_COMMUNITY): Payer: Self-pay | Admitting: Psychiatry

## 2015-07-05 DIAGNOSIS — F39 Unspecified mood [affective] disorder: Secondary | ICD-10-CM

## 2015-07-05 DIAGNOSIS — F331 Major depressive disorder, recurrent, moderate: Secondary | ICD-10-CM

## 2015-07-05 DIAGNOSIS — F411 Generalized anxiety disorder: Secondary | ICD-10-CM | POA: Diagnosis not present

## 2015-07-05 DIAGNOSIS — F410 Panic disorder [episodic paroxysmal anxiety] without agoraphobia: Secondary | ICD-10-CM

## 2015-07-05 MED ORDER — ALPRAZOLAM 0.5 MG PO TABS: 0.5000 mg | ORAL_TABLET | Freq: Every evening | ORAL | Status: DC | PRN

## 2015-07-05 MED ORDER — LAMOTRIGINE 200 MG PO TABS: 200.0000 mg | ORAL_TABLET | Freq: Every day | ORAL | Status: DC

## 2015-07-05 MED ORDER — CITALOPRAM HYDROBROMIDE 40 MG PO TABS: 40.0000 mg | ORAL_TABLET | Freq: Every day | ORAL | Status: DC

## 2015-07-05 NOTE — Progress Notes (Signed)
Patient ID: Shannon [name], female   DOB: Nov 26, 1965, 49 y.o.   MRN: [id number]  [facility] Follow up visit  Shannon [name] [id number] 49 y.o.  07/05/2015 8:49 AM  Chief Complaint:  Depression follow up  History of Present Illness:   Patient Presents for follow up and medication management for Major depression and Generalized anxiety disorder. In past she has  followed with Dr. Letta Moynahan psychiatrist office for many years. She has been on Lamictal and Zoloft. Recently her Zoloft was discontinued and she was started on that to [place]. Apparently she felt more agitation on the latuda so she called their office for a change. She was not getting a quick responses so she wanted to change provider.  Depression; she had some concern with the first refill said that the Celexa was a different kind of bran. But now she has cut the refill and this medication is working better. Bariatric surgery; expected October 23. She understands that it may lead to depression and she will have to follow up more frequently and she will not stop her medications Anxiety; worried about the surgery. 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  No side effects on Lamictal or rash She has gone thru divorce, which has been a significant contributing factor to her depression. She has appointment today with therapist.  Severity of depression. 7 out of 10. 10 being no depression.  Her other factors include difficult marriage that started 20 years ago leading to divorced. She now take care of her son 11 years of age  Modifying factors; she likes to work on crafts goes to [employer] and also arts work.  She has been diagnosed with mood disorder there is no clear history of bipolar but she does get irritable or edgy for some time but she feels it is out of frustration and depression    Past Psychiatric History/Hospitalization(s) Manged for depression and mood symptoms for more then 20  years.  Prior Suicide Attempts: No  Medical History; Past Medical History  Diagnosis Date  . Hyperlipidemia   . Sleep apnea   . Anxiety   . Depression   . Diabetes mellitus without complication     Allergies: Allergies  Allergen Reactions  . Belviq [Lorcaserin Hcl]     Increased appetitie  . Contrave [Naltrexone-Bupropion Hcl Er]     Sleepy.  . Food     Big Red Gum  . Latuda [Lurasidone Hcl]     Severe aggitation  . Lipitor [Atorvastatin] Other (See Comments)    Muscle aches    Medications: Outpatient Encounter Prescriptions as of 07/05/2015  Medication Sig  . ALPRAZolam (XANAX) 0.5 MG tablet Take 1 tablet (0.5 mg total) by mouth at bedtime as needed for anxiety.  . AMBULATORY NON FORMULARY MEDICATION AccuCheck One Touch test strips and lancets Dx: E11.9  . citalopram (CELEXA) 40 MG tablet Take 1 tablet (40 mg total) by mouth daily. Take one tablet of 40mg  qd.  . ibuprofen (ADVIL,MOTRIN) 800 MG tablet Take 1 tablet (800 mg total) by mouth every 8 (eight) hours as needed.  . lamoTRIgine (LAMICTAL) 200 MG tablet Take 1 tablet (200 mg total) by mouth daily.  Marland Kitchen levothyroxine (SYNTHROID, LEVOTHROID) 25 MCG tablet TAKE 1 TABLET (25 MCG TOTAL) BY MOUTH DAILY BEFORE BREAKFAST.  . meloxicam (MOBIC) 15 MG tablet   . metFORMIN (GLUCOPHAGE) 500 MG tablet Take 1 tablet (500 mg total) by mouth 2 (two) times daily with a meal. Patient needs  to schedule a follow up appointment before more refills.  . simvastatin (ZOCOR) 20 MG tablet Take 1 tablet (20 mg total) by mouth at bedtime.  . [DISCONTINUED] ALPRAZolam (XANAX) 0.5 MG tablet Take 1 tablet (0.5 mg total) by mouth at bedtime as needed for anxiety.  . [DISCONTINUED] citalopram (CELEXA) 40 MG tablet Take 1 tablet (40 mg total) by mouth daily. Take one tablet of 40mg  qd.  . [DISCONTINUED] lamoTRIgine (LAMICTAL) 200 MG tablet Take 1 tablet (200 mg total) by mouth daily.   No facility-administered encounter medications on file as of  07/05/2015.     Family History; Family History  Problem Relation Age of Onset  . Hyperlipidemia Mother   . Sleep apnea Mother   . Depression Mother   . Diabetes Father   . Hypertension Father   . COPD Father   . Diabetes Paternal Aunt   . Diabetes Paternal Uncle   . Alcohol abuse Paternal Uncle   . Alcohol abuse Paternal Grandfather   . Depression Sister       Labs:  No results found for this or any previous visit (from the past 2160 hour(s)).     Musculoskeletal: Strength & Muscle Tone: within normal limits Gait & Station: normal Patient leans: N/A  Mental Status Examination;   Psychiatric Specialty Exam: Physical Exam  Constitutional: She appears well-developed and well-nourished. No distress.  Skin: She is not diaphoretic.    Review of Systems  Cardiovascular: Negative for chest pain and palpitations.  Skin: Negative for itching and rash.  Neurological: Negative for tremors and headaches.  Psychiatric/Behavioral: Negative for suicidal ideas and substance abuse.    There were no vitals taken for this visit.There is no weight on file to calculate BMI.  General Appearance: Casual  Eye Contact::  Fair  Speech:  Normal Rate  Volume:  Normal  Mood:  euthymic  Affect:  Congruent  Thought Process:  Coherent  Orientation:  Full (Time, Place, and Person)  Thought Content:  Rumination  Suicidal Thoughts:  No  Homicidal Thoughts:  No  Memory:  Immediate;   Fair Recent;   Fair  Judgement:  Fair  Insight:  Shallow  Psychomotor Activity:  Normal  Concentration:  Fair  Recall:  Fair  Akathisia:  Negative  Handed:  Right  AIMS (if indicated):     Assets:  Communication Skills Desire for Improvement Financial Resources/Insurance Housing  Sleep:        Assessment: Axis I: mood disorder unspecified. Rule out mood disorder secondary to general medical condition. Major depressive disorder recurrent moderate. Rule out panic disorder. Generalized anxiety  disorder  Axis II: deferred  Axis III:  Past Medical History  Diagnosis Date  . Hyperlipidemia   . Sleep apnea   . Anxiety   . Depression   . Diabetes mellitus without complication     Axis IV: psychosocial   Treatment Plan and Summary: continue Celexa to 40 mg. For depression, anxiety continue lamictal 200mg  qd.  Xanax 0.5 mg qd prn for anxiety and panic attacks. Take drug disorder nor those at times to prevent dependence Continue therapy  She is a non-smoker Pertinent Labs and Relevant Prior Notes reviewed. Medication Side effects, benefits and risks reviewed/discussed with Patient. Time given for patient to respond and asks questions regarding the Diagnosis and Medications. Safety concerns and to report to ER if suicidal or call 911. Relevant Medications refilled or called in to pharmacy. Discussed weight maintenance and Sleep Hygiene. Follow up with Primary care provider in  regards to Medical conditions. Recommend compliance with medications and follow up office appointments. Discussed to avail opportunity to consider or/and continue Individual therapy with Counselor. Greater than 50% of time was spend in counseling and coordination of care with the patient.  Schedule for Follow up visit in 8  weeks or call in earlier as necessary. She can come early after bariatric surgery expected October 23rd.   Merian Capron, MD 07/05/2015

## 2015-07-06 ENCOUNTER — Ambulatory Visit: Payer: Self-pay | Admitting: General Surgery

## 2015-07-09 ENCOUNTER — Ambulatory Visit: Payer: Self-pay

## 2015-07-16 ENCOUNTER — Encounter: Payer: 59 | Attending: General Surgery

## 2015-07-16 VITALS — Wt 241.3 lb

## 2015-07-16 DIAGNOSIS — Z6841 Body Mass Index (BMI) 40.0 and over, adult: Secondary | ICD-10-CM | POA: Insufficient documentation

## 2015-07-16 DIAGNOSIS — Z713 Dietary counseling and surveillance: Secondary | ICD-10-CM | POA: Diagnosis not present

## 2015-07-16 DIAGNOSIS — E669 Obesity, unspecified: Secondary | ICD-10-CM | POA: Insufficient documentation

## 2015-07-17 ENCOUNTER — Other Ambulatory Visit: Payer: Self-pay | Admitting: Physician Assistant

## 2015-07-18 NOTE — Progress Notes (Signed)
  Pre-Operative Nutrition Class:  Appt start time: 7793   End time:  1830.  Patient was seen on 07/16/15 for Pre-Operative Bariatric Surgery Education at [facility].   Surgery date: 08/13/15 Surgery type: Sleeve gastrectomy Start weight at [facility]: 237 lbs on 04/12/15 Weight today: 241.3 lbs  TANITA  BODY COMP RESULTS  07/16/15   BMI (kg/m^2) N/A   Fat Mass (lbs)    Fat Free Mass (lbs)    Total Body Water (lbs)     The following the learning objectives were met by the patient during this course:  Identify Pre-Op Dietary Goals and will begin 2 weeks pre-operatively  Identify appropriate sources of fluids and proteins   State protein recommendations and appropriate sources pre and post-operatively  Identify Post-Operative Dietary Goals and will follow for 2 weeks post-operatively  Identify appropriate multivitamin and calcium sources  Describe the need for physical activity post-operatively and will follow MD recommendations  State when to call healthcare provider regarding medication questions or post-operative complications  Handouts given during class include:  Pre-Op Bariatric Surgery Diet Handout  Protein Shake Handout  Post-Op Bariatric Surgery Nutrition Handout  BELT Program Information Flyer  Support Group Information Flyer  [facility] Bariatric Supplements Price List  Follow-Up Plan: Patient will follow-up at [facility] 2 weeks post operatively for diet advancement per MD.

## 2015-07-26 ENCOUNTER — Ambulatory Visit: Payer: Self-pay | Admitting: General Surgery

## 2015-07-26 ENCOUNTER — Encounter (HOSPITAL_COMMUNITY): Payer: Self-pay | Admitting: *Deleted

## 2015-07-26 NOTE — H&P (Signed)
Shannon [name] 07/26/2015 9:35 AM Location: [facility] Patient #: [id number] DOB: 06-30-1966 Divorced / Language: [name] / Race: White Female History of Present Illness [name] M. Sieara Bremer MD; 07/26/2015 9:44 AM) Patient words: bari preop.  The patient is a 49 year old female who presents for a pre-op visit. She comes in today for her preoperative appointment. She has been approved for laparoscopic sleeve gastrectomy with possible hiatal hernia repair and upper endoscopy. I initially met her on May 13. Her weight at that time was 228 pounds. She denies any changes to her medical history since I initially met her. She denies any chest pain, chest pressure, shortness of breath, dyspnea on exertion, orthopnea, abdominal pain, diarrhea, or constipation. She denies any lightheadedness or dizziness. She denies any chest pain presents a hospital.  She does have some occasional reflux.  Her preoperative workup showed a small hiatal hernia on her upper GI. Her chest x-Scarlata was normal. Her CBC and other labs were within normal limits. Her vitamin D level was borderline at 31. 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 Cesarean Section - 1 Appendectomy  Diagnostic Studies History Gayland Curry, MD; 07/26/2015 9:44 AM) Mammogram within last year Colonoscopy >10 years ago Pap Smear 1-5 years ago  Allergies (Ammie Eversole, LPN; 72/0/9470 9:62 AM) Melynda Keller  *ADHD/ANTI-NARCOLEPSY/ANTI-OBESITY/ANOREXIANTS* Anette Guarneri *ANTIPSYCHOTICS/ANTIMANIC AGENTS* Lipitor *ANTIHYPERLIPIDEMICS* Contrave *ADHD/ANTI-NARCOLEPSY/ANTI-OBESITY/ANOREXIANTS*  Medication History (Ammie Eversole, LPN; 83/03/6293 7:65 AM) Xanax (0.5MG Tablet, Oral) Active. Citalopram Hydrobromide (40MG Tablet, Oral) Active. Levothyroxine Sodium (25MCG Tablet, Oral) Active. MetFORMIN HCl (500MG Tablet, Oral) Active. Simvastatin (20MG Tablet, Oral) Active. Medications Reconciled  Social History Gayland Curry, MD; 07/26/2015 9:44 AM) No drug use Caffeine use Carbonated beverages, Tea. Tobacco use Never smoker. Alcohol use Occasional alcohol use.  Family History Gayland Curry, MD; 07/26/2015 9:44 AM) Colon Polyps Mother. Arthritis Mother. Diabetes Mellitus Father. Depression Mother, Sister. Respiratory Condition Father. Hypertension Father.  Pregnancy / Birth History Gayland Curry, MD; 07/26/2015 9:44 AM) Romie Minus 1 Maternal age 63-35 Regular periods Gravida 1 Age at menarche 18 years.     Review of Systems [name] M. Nyeemah Jennette MD; 07/26/2015 9:40 AM) General Present- Fatigue, Night Sweats and Weight Gain. Not Present- Appetite Loss, Chills, Fever and Weight Loss. Skin Present- Dryness. Not Present- Change in Wart/Mole, Hives, Jaundice, New Lesions, Non-Healing Wounds, Rash and Ulcer. HEENT Not Present- Earache, Hearing Loss, Hoarseness, Nose Bleed, Oral Ulcers, Ringing in the Ears, Seasonal Allergies, Sinus Pain, Sore Throat, Visual Disturbances, Wears glasses/contact lenses and Yellow Eyes. Respiratory Not Present- Bloody sputum, Chronic Cough, Difficulty Breathing, Snoring and Wheezing. Breast Not Present- Breast Mass, Breast Pain, Nipple Discharge and Skin Changes. Cardiovascular Present- Swelling of Extremities. Not Present- Chest Pain, Difficulty Breathing Lying Down, Leg Cramps, Palpitations, Rapid Heart Rate and Shortness of Breath. Gastrointestinal  Present- Bloating. Not Present- Abdominal Pain, Bloody Stool, Change in Bowel Habits, Chronic diarrhea, Constipation, Difficulty Swallowing, Excessive gas, Gets full quickly at meals, Hemorrhoids, Indigestion, Nausea, Rectal Pain and Vomiting. Female Genitourinary Present- Urgency. Not Present- Frequency, Nocturia, Painful Urination and Pelvic Pain. Musculoskeletal Present- Back Pain and Muscle Weakness.  Not Present- Joint Pain, Joint Stiffness, Muscle Pain and Swelling of Extremities. Neurological Present- Numbness and Tingling. Not Present- Decreased Memory, Fainting, Headaches, Seizures, Tremor, Trouble walking and Weakness. Psychiatric Present- Anxiety and Depression. Not Present- Bipolar, Change in Sleep Pattern, Fearful and Frequent crying. Endocrine Present- Hot flashes. Not Present- Cold Intolerance, Excessive Hunger, Hair Changes, Heat Intolerance and New Diabetes. Hematology Not Present- Easy Bruising, Excessive bleeding, Gland problems, HIV and Persistent Infections.  Vitals (Ammie Eversole LPN; 62/12/7626 3:15 AM) 07/26/2015 9:35 AM Weight: 243.6 lb Height: 65.5in Body Surface Area: 2.26 m Body Mass Index: 39.92 kg/m Temp.: 98.31F(Oral)  Pulse: 74 (Regular)  BP: 144/88 (Sitting, Left Arm, Standard)     Physical Exam [name] M. Rozanne Heumann MD; 07/26/2015 9:40 AM)  General Mental Status-Alert. General Appearance-Consistent with stated age. Hydration-Well hydrated. Voice-Normal. Note: obese   Head and Neck Head-normocephalic, atraumatic with no lesions or palpable masses. Trachea-midline. Thyroid Gland Characteristics - normal size and consistency.  Eye Eyeball - Bilateral-Extraocular movements intact. Sclera/Conjunctiva - Bilateral-No scleral icterus.  Chest and Lung Exam Chest and lung exam reveals -quiet, even and easy respiratory effort with no use of accessory muscles and on auscultation, normal breath sounds, no adventitious sounds and  normal vocal resonance. Inspection Chest Wall - Normal. Back - normal.  Breast - Did not examine.  Cardiovascular Cardiovascular examination reveals -normal heart sounds, regular rate and rhythm with no murmurs and normal pedal pulses bilaterally.  Abdomen Inspection Inspection of the abdomen reveals - No Hernias. Skin - Scar - Note: old c/s scar. Palpation/Percussion Palpation and Percussion of the abdomen reveal - Soft, Non Tender, No Rebound tenderness, No Rigidity (guarding) and No hepatosplenomegaly. Auscultation Auscultation of the abdomen reveals - Bowel sounds normal.  Peripheral Vascular Upper Extremity Palpation - Pulses bilaterally normal.  Neurologic Neurologic evaluation reveals -alert and oriented x 3 with no impairment of recent or remote memory. Mental Status-Normal.  Neuropsychiatric The patient's mood and affect are described as -normal. Judgment and Insight-insight is appropriate concerning matters relevant to self.  Musculoskeletal Normal Exam - Left-Upper Extremity Strength Normal and Lower Extremity Strength Normal. Normal Exam - Right-Upper Extremity Strength Normal and Lower Extremity Strength Normal. Note: bilateral knee crepitus   Lymphatic Head & Neck  General Head & Neck Lymphatics: Bilateral - Description - Normal. Axillary - Did not examine. Femoral & Inguinal - Did not examine.    Assessment & Plan [name] M. Gelila Well MD; 07/26/2015 9:44 AM)  OBESITY (BMI 30-39.9) (E66.9) Impression: We reviewed her preoperative workup. We discussed the finding of a hiatal hernia. We discussed that we would test for one during surgery. If found to have a clinically significant hiatal hernia I recommended proceeding with repair at the time of surgery. We discussed what that would involve including the risk and benefits. We discussed the preoperative diet. We've rediscussed the typical postoperative course. She was given postoperative prescriptions  (on paper) for pain, reflux, and nausea medications today. I encouraged her to contact the office should she have any questions between now and surgery.  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 Redmond Pulling, MD, FACS General, Bariatric, & Minimally Invasive Surgery [facility], [place]

## 2015-07-30 NOTE — Patient Instructions (Signed)
Emerie D Artley  07/30/2015   Your procedure is scheduled on: 08/13/2015    Report to [facility] Main  Entrance take [place]  elevators to 3rd floor to  [place] at   [place] AM.  Call this number if you have problems the morning of surgery [phone]   Remember: ONLY 1 PERSON MAY GO WITH YOU TO SHORT STAY TO GET  READY MORNING OF YOUR SURGERY.  Do not eat food or drink liquids :After Midnight.     Take these medicines the morning of surgery with A SIP OF WATER: lamictal synthroid  DO NOT TAKE ANY DIABETIC MEDICATIONS DAY OF YOUR SURGERY                               You may not have any metal on your body including hair pins and              piercings  Do not wear jewelry, make-up, lotions, powders or perfumes, deodorant             Do not wear nail polish.  Do not shave  48 hours prior to surgery.                Do not bring valuables to the hospital. [place].  Contacts, dentures or bridgework may not be worn into surgery.  Leave suitcase in the car. After surgery it may be brought to your room.       Special Instructions: coughing and deep breathing exercises, leg exercises               Please read over the following fact sheets you were given: _____________________________________________________________________             [facility] - Preparing for Surgery Before surgery, you can play an important role.  Because skin is not sterile, your skin needs to be as free of germs as possible.  You can reduce the number of germs on your skin by washing with CHG (chlorahexidine gluconate) soap before surgery.  CHG is an antiseptic cleaner which kills germs and bonds with the skin to continue killing germs even after washing. Please DO NOT use if you have an allergy to CHG or antibacterial soaps.  If your skin becomes reddened/irritated stop using the CHG and inform your nurse when you arrive at Short  Stay. Do not shave (including legs and underarms) for at least 48 hours prior to the first CHG shower.  You may shave your face/neck. Please follow these instructions carefully:  1.  Shower with CHG Soap the night before surgery and the  morning of Surgery.  2.  If you choose to wash your hair, wash your hair first as usual with your  normal  shampoo.  3.  After you shampoo, rinse your hair and body thoroughly to remove the  shampoo.                           4.  Use CHG as you would any other liquid soap.  You can apply chg directly  to the skin and wash  Gently with a scrungie or clean washcloth.  5.  Apply the CHG Soap to your body ONLY FROM THE NECK DOWN.   Do not use on face/ open                           Wound or open sores. Avoid contact with eyes, ears mouth and genitals (private parts).                       Wash face,  Genitals (private parts) with your normal soap.             6.  Wash thoroughly, paying special attention to the area where your surgery  will be performed.  7.  Thoroughly rinse your body with warm water from the neck down.  8.  DO NOT shower/wash with your normal soap after using and rinsing off  the CHG Soap.                9.  Pat yourself dry with a clean towel.            10.  Wear clean pajamas.            11.  Place clean sheets on your bed the night of your first shower and do not  sleep with pets. Day of Surgery : Do not apply any lotions/deodorants the morning of surgery.  Please wear clean clothes to the hospital/surgery center.  FAILURE TO FOLLOW THESE INSTRUCTIONS MAY RESULT IN THE CANCELLATION OF YOUR SURGERY PATIENT SIGNATURE_________________________________  NURSE SIGNATURE__________________________________  ________________________________________________________________________

## 2015-07-31 ENCOUNTER — Encounter (HOSPITAL_COMMUNITY): Payer: Self-pay

## 2015-07-31 ENCOUNTER — Encounter (HOSPITAL_COMMUNITY)
Admission: RE | Admit: 2015-07-31 | Discharge: 2015-07-31 | Disposition: A | Discharge status: Home / Self Care | Payer: 59 | Source: Ambulatory Visit | Attending: General Surgery | Admitting: General Surgery

## 2015-07-31 DIAGNOSIS — Z01818 Encounter for other preprocedural examination: Secondary | ICD-10-CM | POA: Insufficient documentation

## 2015-07-31 HISTORY — DX: Personal history of other diseases of the digestive system: Z87.19

## 2015-07-31 HISTORY — DX: Hypothyroidism, unspecified: E03.9

## 2015-07-31 HISTORY — DX: Gastro-esophageal reflux disease without esophagitis: K21.9

## 2015-07-31 HISTORY — DX: Reserved for inherently not codable concepts without codable children: IMO0001

## 2015-07-31 HISTORY — DX: Other specified postprocedural states: Z98.890

## 2015-07-31 HISTORY — DX: Stress incontinence (female) (male): N39.3

## 2015-07-31 HISTORY — DX: Unspecified osteoarthritis, unspecified site: M19.90

## 2015-07-31 HISTORY — DX: Nausea with vomiting, unspecified: R11.2

## 2015-07-31 LAB — CBC WITH DIFFERENTIAL/PLATELET
BASOS PCT: 1 %
Basophils Absolute: 0.1 10*3/uL (ref 0.0–0.1)
Eosinophils Absolute: 0.4 10*3/uL (ref 0.0–0.7)
Eosinophils Relative: 5 %
HEMATOCRIT: 40.2 % (ref 36.0–46.0)
HEMOGLOBIN: 12.9 g/dL (ref 12.0–15.0)
LYMPHS PCT: 34 %
Lymphs Abs: 2.6 10*3/uL (ref 0.7–4.0)
MCH: 29.4 pg (ref 26.0–34.0)
MCHC: 32.1 g/dL (ref 30.0–36.0)
MCV: 91.6 fL (ref 78.0–100.0)
MONOS PCT: 5 %
Monocytes Absolute: 0.4 10*3/uL (ref 0.1–1.0)
NEUTROS ABS: 4.3 10*3/uL (ref 1.7–7.7)
NEUTROS PCT: 55 %
Platelets: 419 10*3/uL — ABNORMAL HIGH (ref 150–400)
RBC: 4.39 MIL/uL (ref 3.87–5.11)
RDW: 13.3 % (ref 11.5–15.5)
WBC: 7.8 10*3/uL (ref 4.0–10.5)

## 2015-07-31 LAB — COMPREHENSIVE METABOLIC PANEL
ALBUMIN: 4 g/dL (ref 3.5–5.0)
ALT: 43 U/L (ref 14–54)
ANION GAP: 7 (ref 5–15)
AST: 38 U/L (ref 15–41)
Alkaline Phosphatase: 78 U/L (ref 38–126)
BILIRUBIN TOTAL: 0.3 mg/dL (ref 0.3–1.2)
BUN: 20 mg/dL (ref 6–20)
CALCIUM: 9.4 mg/dL (ref 8.9–10.3)
CO2: 29 mmol/L (ref 22–32)
Chloride: 102 mmol/L (ref 101–111)
Creatinine, Ser: 0.66 mg/dL (ref 0.44–1.00)
GFR calc Af Amer: >60 mL/min (ref >60)
GFR calc non Af Amer: >60 mL/min (ref >60)
GLUCOSE: 113 mg/dL — AB (ref 65–99)
Potassium: 4 mmol/L (ref 3.5–5.1)
Sodium: 138 mmol/L (ref 135–145)
TOTAL PROTEIN: 7.2 g/dL (ref 6.5–8.1)

## 2015-07-31 LAB — HCG, SERUM, QUALITATIVE: Preg, Serum: NEGATIVE

## 2015-07-31 NOTE — Progress Notes (Signed)
EKG and CXR- 03/2015 in [facility]

## 2015-08-08 ENCOUNTER — Encounter: Payer: Self-pay | Admitting: Physician Assistant

## 2015-08-08 ENCOUNTER — Ambulatory Visit (INDEPENDENT_AMBULATORY_CARE_PROVIDER_SITE_OTHER): Payer: 59 | Admitting: Physician Assistant

## 2015-08-08 VITALS — BP 129/62 | HR 93 | Ht 65.5 in | Wt 234.0 lb

## 2015-08-08 DIAGNOSIS — G4733 Obstructive sleep apnea (adult) (pediatric): Secondary | ICD-10-CM | POA: Diagnosis not present

## 2015-08-08 DIAGNOSIS — E669 Obesity, unspecified: Secondary | ICD-10-CM

## 2015-08-08 DIAGNOSIS — E119 Type 2 diabetes mellitus without complications: Secondary | ICD-10-CM

## 2015-08-08 DIAGNOSIS — Z01818 Encounter for other preprocedural examination: Secondary | ICD-10-CM

## 2015-08-08 DIAGNOSIS — Z9989 Dependence on other enabling machines and devices: Secondary | ICD-10-CM

## 2015-08-08 NOTE — Progress Notes (Signed)
   Subjective:    Patient ID: Shannon [name], female    DOB: 19-Apr-1966, 49 y.o.   MRN: [id number]  HPI Patient is a 49 year old female who presents to the clinic for presurgical clearance for gastric sleeve that is scheduled for 08/20/2015. She has no concerns today and feels great.   Review of Systems  All other systems reviewed and are negative.      Objective:   Physical Exam  Constitutional: She is oriented to person, place, and time. She appears well-developed and well-nourished.  HENT:  Head: Normocephalic and atraumatic.  Cardiovascular: Normal rate, regular rhythm and normal heart sounds.   Pulmonary/Chest: Effort normal and breath sounds normal. She has no wheezes.  Neurological: She is alert and oriented to person, place, and time.  Skin: Skin is dry.  Psychiatric: She has a normal mood and affect. Her behavior is normal.          Assessment & Plan:  Presurgical clearance/obesity-EKG normal sinus rhythm, no acute EKG changes, no ST depression or elevation.  Viewed labs drawn in chart. hgb looks great. Kidney and liver look great.  Released for surgery.   Hypothyroidism-patient is due for recheck. She requests to have this done after the surgery. Will refill until we check up 3 months after surgery.  DM type II-patient is on metformin and last A!C 6.4. I suspect that we will be able to discontinue this medicine in 3 months. I thought the surgery will help get her sugars more under control. Discussed with pt metformin can help with weight loss as well.   Sleep apnea-reassured patient that certainly when she starts to lose weight she may not need her CPAP machine anymore. When she gets to her weight loss goal we could consider retesting for sleep apnea.

## 2015-08-13 ENCOUNTER — Encounter (HOSPITAL_COMMUNITY)
Admission: AD | Disposition: A | Discharge status: Home / Self Care | Payer: Self-pay | Source: Ambulatory Visit | Attending: General Surgery

## 2015-08-13 ENCOUNTER — Inpatient Hospital Stay (HOSPITAL_COMMUNITY): Payer: 59 | Admitting: Anesthesiology

## 2015-08-13 ENCOUNTER — Inpatient Hospital Stay (HOSPITAL_COMMUNITY)
Admission: AD | Admit: 2015-08-13 | Discharge: 2015-08-15 | DRG: 621 | Disposition: A | Discharge status: Home / Self Care | Payer: 59 | Source: Ambulatory Visit | Attending: General Surgery | Admitting: General Surgery

## 2015-08-13 ENCOUNTER — Encounter (HOSPITAL_COMMUNITY): Payer: Self-pay | Admitting: Anesthesiology

## 2015-08-13 DIAGNOSIS — Z79899 Other long term (current) drug therapy: Secondary | ICD-10-CM | POA: Diagnosis not present

## 2015-08-13 DIAGNOSIS — F329 Major depressive disorder, single episode, unspecified: Secondary | ICD-10-CM | POA: Diagnosis present

## 2015-08-13 DIAGNOSIS — M47816 Spondylosis without myelopathy or radiculopathy, lumbar region: Secondary | ICD-10-CM | POA: Diagnosis present

## 2015-08-13 DIAGNOSIS — E039 Hypothyroidism, unspecified: Secondary | ICD-10-CM | POA: Diagnosis present

## 2015-08-13 DIAGNOSIS — F419 Anxiety disorder, unspecified: Secondary | ICD-10-CM | POA: Diagnosis present

## 2015-08-13 DIAGNOSIS — K219 Gastro-esophageal reflux disease without esophagitis: Secondary | ICD-10-CM | POA: Diagnosis present

## 2015-08-13 DIAGNOSIS — K449 Diaphragmatic hernia without obstruction or gangrene: Secondary | ICD-10-CM | POA: Diagnosis present

## 2015-08-13 DIAGNOSIS — Z01812 Encounter for preprocedural laboratory examination: Secondary | ICD-10-CM

## 2015-08-13 DIAGNOSIS — R110 Nausea: Secondary | ICD-10-CM

## 2015-08-13 DIAGNOSIS — Z6837 Body mass index (BMI) 37.0-37.9, adult: Secondary | ICD-10-CM

## 2015-08-13 DIAGNOSIS — M5136 Other intervertebral disc degeneration, lumbar region: Secondary | ICD-10-CM | POA: Diagnosis present

## 2015-08-13 DIAGNOSIS — E119 Type 2 diabetes mellitus without complications: Secondary | ICD-10-CM | POA: Diagnosis present

## 2015-08-13 DIAGNOSIS — Z833 Family history of diabetes mellitus: Secondary | ICD-10-CM | POA: Diagnosis not present

## 2015-08-13 DIAGNOSIS — E781 Pure hyperglyceridemia: Secondary | ICD-10-CM | POA: Diagnosis present

## 2015-08-13 DIAGNOSIS — E7800 Pure hypercholesterolemia, unspecified: Secondary | ICD-10-CM | POA: Diagnosis present

## 2015-08-13 DIAGNOSIS — E785 Hyperlipidemia, unspecified: Secondary | ICD-10-CM | POA: Diagnosis present

## 2015-08-13 DIAGNOSIS — E669 Obesity, unspecified: Secondary | ICD-10-CM | POA: Diagnosis present

## 2015-08-13 DIAGNOSIS — Z8249 Family history of ischemic heart disease and other diseases of the circulatory system: Secondary | ICD-10-CM | POA: Diagnosis not present

## 2015-08-13 DIAGNOSIS — Z9884 Bariatric surgery status: Secondary | ICD-10-CM

## 2015-08-13 DIAGNOSIS — G4733 Obstructive sleep apnea (adult) (pediatric): Secondary | ICD-10-CM | POA: Diagnosis present

## 2015-08-13 DIAGNOSIS — M542 Cervicalgia: Secondary | ICD-10-CM | POA: Diagnosis present

## 2015-08-13 DIAGNOSIS — E1121 Type 2 diabetes mellitus with diabetic nephropathy: Secondary | ICD-10-CM

## 2015-08-13 DIAGNOSIS — Z9989 Dependence on other enabling machines and devices: Secondary | ICD-10-CM

## 2015-08-13 HISTORY — PX: LAPAROSCOPIC GASTRIC SLEEVE RESECTION WITH HIATAL HERNIA REPAIR: SHX6512

## 2015-08-13 HISTORY — PX: UPPER GI ENDOSCOPY: SHX6162

## 2015-08-13 LAB — GLUCOSE, CAPILLARY
GLUCOSE-CAPILLARY: 133 mg/dL — AB (ref 65–99)
GLUCOSE-CAPILLARY: 152 mg/dL — AB (ref 65–99)
GLUCOSE-CAPILLARY: 170 mg/dL — AB (ref 65–99)
Glucose-Capillary: 76 mg/dL (ref 65–99)
Glucose-Capillary: 68 mg/dL (ref 65–99)

## 2015-08-13 LAB — HEMOGLOBIN AND HEMATOCRIT, BLOOD
HEMATOCRIT: 39.3 % (ref 36.0–46.0)
HEMOGLOBIN: 12.9 g/dL (ref 12.0–15.0)

## 2015-08-13 SURGERY — GASTRECTOMY, SLEEVE, LAPAROSCOPIC, WITH HIATAL HERNIA REPAIR
Anesthesia: General

## 2015-08-13 MED ORDER — OXYCODONE HCL 5 MG/5ML PO SOLN
5.0000 mg | ORAL | Status: DC | PRN
  Administered 2015-08-14 – 2015-08-15 (×3): 5 mg via ORAL
  Filled 2015-08-13 (×3): qty 5

## 2015-08-13 MED ORDER — LACTATED RINGERS IR SOLN
Status: DC | PRN
  Administered 2015-08-13: 1000 mL

## 2015-08-13 MED ORDER — 0.9 % SODIUM CHLORIDE (POUR BTL) OPTIME
TOPICAL | Status: DC | PRN
  Administered 2015-08-13: 1000 mL

## 2015-08-13 MED ORDER — SUGAMMADEX SODIUM 500 MG/5ML IV SOLN
INTRAVENOUS | Status: DC | PRN
  Administered 2015-08-13: 250 mg via INTRAVENOUS

## 2015-08-13 MED ORDER — SUCCINYLCHOLINE CHLORIDE 20 MG/ML IJ SOLN
INTRAMUSCULAR | Status: DC | PRN
  Administered 2015-08-13: 100 mg via INTRAVENOUS

## 2015-08-13 MED ORDER — DEXTROSE 50 % IV SOLN
INTRAVENOUS | Status: DC | PRN
  Administered 2015-08-13: 15 mL via INTRAVENOUS

## 2015-08-13 MED ORDER — HYDROMORPHONE HCL 1 MG/ML IJ SOLN
INTRAMUSCULAR | Status: AC
  Filled 2015-08-13: qty 1

## 2015-08-13 MED ORDER — LACTATED RINGERS IV SOLN: INTRAVENOUS | Status: DC

## 2015-08-13 MED ORDER — LIDOCAINE HCL (CARDIAC) 20 MG/ML IV SOLN
INTRAVENOUS | Status: DC | PRN
  Administered 2015-08-13: 50 mg via INTRAVENOUS

## 2015-08-13 MED ORDER — SUGAMMADEX SODIUM 500 MG/5ML IV SOLN
INTRAVENOUS | Status: AC
  Filled 2015-08-13: qty 5

## 2015-08-13 MED ORDER — METOCLOPRAMIDE HCL 5 MG/ML IJ SOLN
INTRAMUSCULAR | Status: AC
  Filled 2015-08-13: qty 2

## 2015-08-13 MED ORDER — BUPIVACAINE LIPOSOME 1.3 % IJ SUSP
20.0000 mL | Freq: Once | INTRAMUSCULAR | Status: DC
  Filled 2015-08-13: qty 20

## 2015-08-13 MED ORDER — ONDANSETRON HCL 4 MG/2ML IJ SOLN
4.0000 mg | INTRAMUSCULAR | Status: DC | PRN
  Administered 2015-08-14 (×2): 4 mg via INTRAVENOUS
  Filled 2015-08-13 (×2): qty 2

## 2015-08-13 MED ORDER — FENTANYL CITRATE (PF) 100 MCG/2ML IJ SOLN
INTRAMUSCULAR | Status: DC | PRN
  Administered 2015-08-13 (×3): 50 ug via INTRAVENOUS
  Administered 2015-08-13: 100 ug via INTRAVENOUS

## 2015-08-13 MED ORDER — CHLORHEXIDINE GLUCONATE 4 % EX LIQD: 60.0000 mL | Freq: Once | CUTANEOUS | Status: DC

## 2015-08-13 MED ORDER — DEXTROSE 50 % IV SOLN
INTRAVENOUS | Status: AC
  Filled 2015-08-13: qty 50

## 2015-08-13 MED ORDER — PREMIER PROTEIN SHAKE
2.0000 [oz_av] | Freq: Four times a day (QID) | ORAL | Status: DC
  Administered 2015-08-15: 2 [oz_av] via ORAL

## 2015-08-13 MED ORDER — PROPOFOL 10 MG/ML IV BOLUS
INTRAVENOUS | Status: AC
  Filled 2015-08-13: qty 20

## 2015-08-13 MED ORDER — INSULIN ASPART 100 UNIT/ML ~~LOC~~ SOLN
0.0000 [IU] | SUBCUTANEOUS | Status: DC
  Administered 2015-08-13 – 2015-08-14 (×2): 2 [IU] via SUBCUTANEOUS

## 2015-08-13 MED ORDER — PANTOPRAZOLE SODIUM 40 MG IV SOLR
40.0000 mg | Freq: Every day | INTRAVENOUS | Status: DC
  Administered 2015-08-13 – 2015-08-14 (×2): 40 mg via INTRAVENOUS
  Filled 2015-08-13 (×3): qty 40

## 2015-08-13 MED ORDER — POTASSIUM CHLORIDE IN NACL 20-0.45 MEQ/L-% IV SOLN
INTRAVENOUS | Status: DC
  Administered 2015-08-13: 18:00:00 via INTRAVENOUS
  Administered 2015-08-14: 125 mL/h via INTRAVENOUS
  Administered 2015-08-14: 03:00:00 via INTRAVENOUS
  Administered 2015-08-15: 125 mL/h via INTRAVENOUS
  Filled 2015-08-13 (×10): qty 1000

## 2015-08-13 MED ORDER — ACETAMINOPHEN 160 MG/5ML PO SOLN: 325.0000 mg | ORAL | Status: DC | PRN

## 2015-08-13 MED ORDER — HEPARIN SODIUM (PORCINE) 5000 UNIT/ML IJ SOLN
5000.0000 [IU] | INTRAMUSCULAR | Status: AC
  Administered 2015-08-13: 5000 [IU] via SUBCUTANEOUS
  Filled 2015-08-13: qty 1

## 2015-08-13 MED ORDER — ROCURONIUM BROMIDE 100 MG/10ML IV SOLN
INTRAVENOUS | Status: DC | PRN
  Administered 2015-08-13: 10 mg via INTRAVENOUS
  Administered 2015-08-13: 40 mg via INTRAVENOUS
  Administered 2015-08-13 (×2): 10 mg via INTRAVENOUS

## 2015-08-13 MED ORDER — PROMETHAZINE HCL 25 MG/ML IJ SOLN
6.2500 mg | INTRAMUSCULAR | Status: DC | PRN
  Administered 2015-08-13: 12.5 mg via INTRAVENOUS

## 2015-08-13 MED ORDER — PROMETHAZINE HCL 25 MG/ML IJ SOLN
INTRAMUSCULAR | Status: AC
  Filled 2015-08-13: qty 1

## 2015-08-13 MED ORDER — SODIUM CHLORIDE 0.9 % IJ SOLN
INTRAMUSCULAR | Status: DC | PRN
  Administered 2015-08-13: 50 mL via INTRAVENOUS

## 2015-08-13 MED ORDER — CEFOTETAN DISODIUM-DEXTROSE 2-2.08 GM-% IV SOLR
INTRAVENOUS | Status: AC
  Filled 2015-08-13: qty 50

## 2015-08-13 MED ORDER — METHOCARBAMOL 1000 MG/10ML IJ SOLN
500.0000 mg | Freq: Three times a day (TID) | INTRAVENOUS | Status: DC
  Administered 2015-08-13 – 2015-08-15 (×6): 500 mg via INTRAVENOUS
  Filled 2015-08-13 (×12): qty 5

## 2015-08-13 MED ORDER — MORPHINE SULFATE (PF) 2 MG/ML IV SOLN
2.0000 mg | INTRAVENOUS | Status: DC | PRN
  Administered 2015-08-13 – 2015-08-14 (×2): 2 mg via INTRAVENOUS
  Administered 2015-08-14: 6 mg via INTRAVENOUS
  Administered 2015-08-14: 2 mg via INTRAVENOUS
  Administered 2015-08-14 – 2015-08-15 (×3): 4 mg via INTRAVENOUS
  Filled 2015-08-13: qty 1
  Filled 2015-08-13 (×3): qty 2
  Filled 2015-08-13 (×2): qty 1
  Filled 2015-08-13: qty 3

## 2015-08-13 MED ORDER — METOCLOPRAMIDE HCL 5 MG/ML IJ SOLN
INTRAMUSCULAR | Status: DC | PRN
  Administered 2015-08-13: 10 mg via INTRAVENOUS

## 2015-08-13 MED ORDER — ROCURONIUM BROMIDE 100 MG/10ML IV SOLN
INTRAVENOUS | Status: AC
  Filled 2015-08-13: qty 1

## 2015-08-13 MED ORDER — BUPIVACAINE LIPOSOME 1.3 % IJ SUSP
INTRAMUSCULAR | Status: DC | PRN
  Administered 2015-08-13: 20 mL

## 2015-08-13 MED ORDER — STERILE WATER FOR IRRIGATION IR SOLN
Status: DC | PRN
  Administered 2015-08-13: 2000 mL

## 2015-08-13 MED ORDER — PROPOFOL 10 MG/ML IV BOLUS
INTRAVENOUS | Status: DC | PRN
  Administered 2015-08-13: 200 mg via INTRAVENOUS

## 2015-08-13 MED ORDER — METOCLOPRAMIDE HCL 5 MG/ML IJ SOLN
10.0000 mg | Freq: Three times a day (TID) | INTRAMUSCULAR | Status: DC | PRN
  Administered 2015-08-14: 10 mg via INTRAVENOUS
  Filled 2015-08-13: qty 2

## 2015-08-13 MED ORDER — ENOXAPARIN SODIUM 30 MG/0.3ML ~~LOC~~ SOLN
30.0000 mg | Freq: Two times a day (BID) | SUBCUTANEOUS | Status: DC
  Administered 2015-08-14 – 2015-08-15 (×3): 30 mg via SUBCUTANEOUS
  Filled 2015-08-13 (×5): qty 0.3

## 2015-08-13 MED ORDER — EPHEDRINE SULFATE 50 MG/ML IJ SOLN
INTRAMUSCULAR | Status: DC | PRN
  Administered 2015-08-13: 5 mg via INTRAVENOUS

## 2015-08-13 MED ORDER — DEXAMETHASONE SODIUM PHOSPHATE 10 MG/ML IJ SOLN
INTRAMUSCULAR | Status: AC
  Filled 2015-08-13: qty 1

## 2015-08-13 MED ORDER — PROMETHAZINE HCL 25 MG/ML IJ SOLN: 12.5000 mg | Freq: Four times a day (QID) | INTRAMUSCULAR | Status: DC | PRN

## 2015-08-13 MED ORDER — SODIUM CHLORIDE 0.9 % IJ SOLN
INTRAMUSCULAR | Status: AC
  Filled 2015-08-13: qty 50

## 2015-08-13 MED ORDER — HYDROMORPHONE HCL 1 MG/ML IJ SOLN
0.2500 mg | INTRAMUSCULAR | Status: DC | PRN
  Administered 2015-08-13 (×3): 0.25 mg via INTRAVENOUS

## 2015-08-13 MED ORDER — MIDAZOLAM HCL 2 MG/2ML IJ SOLN
INTRAMUSCULAR | Status: AC
  Filled 2015-08-13: qty 4

## 2015-08-13 MED ORDER — MIDAZOLAM HCL 5 MG/5ML IJ SOLN
INTRAMUSCULAR | Status: DC | PRN
  Administered 2015-08-13: 2 mg via INTRAVENOUS

## 2015-08-13 MED ORDER — ONDANSETRON HCL 4 MG/2ML IJ SOLN
INTRAMUSCULAR | Status: DC | PRN
  Administered 2015-08-13 (×2): 4 mg via INTRAVENOUS

## 2015-08-13 MED ORDER — ONDANSETRON HCL 4 MG/2ML IJ SOLN
INTRAMUSCULAR | Status: AC
  Filled 2015-08-13: qty 2

## 2015-08-13 MED ORDER — FENTANYL CITRATE (PF) 250 MCG/5ML IJ SOLN
INTRAMUSCULAR | Status: AC
  Filled 2015-08-13: qty 25

## 2015-08-13 MED ORDER — CEFOTETAN DISODIUM-DEXTROSE 2-2.08 GM-% IV SOLR
2.0000 g | INTRAVENOUS | Status: AC
  Administered 2015-08-13: 2 g via INTRAVENOUS

## 2015-08-13 MED ORDER — LACTATED RINGERS IV SOLN
INTRAVENOUS | Status: DC
  Administered 2015-08-13 (×2): via INTRAVENOUS
  Administered 2015-08-13: 1000 mL via INTRAVENOUS

## 2015-08-13 MED ORDER — ACETAMINOPHEN 160 MG/5ML PO SOLN: 650.0000 mg | ORAL | Status: DC | PRN

## 2015-08-13 SURGICAL SUPPLY — 61 items
APPLICATOR COTTON TIP 6IN STRL (MISCELLANEOUS) IMPLANT
APPLIER CLIP ROT 10 11.4 M/L (STAPLE)
BLADE SURG SZ11 CARB STEEL (BLADE) ×4 IMPLANT
CABLE HIGH FREQUENCY MONO STRZ (ELECTRODE) ×4 IMPLANT
CHLORAPREP W/TINT 26ML (MISCELLANEOUS) ×8 IMPLANT
CLIP APPLIE ROT 10 11.4 M/L (STAPLE) IMPLANT
COVER SURGICAL LIGHT HANDLE (MISCELLANEOUS) ×4 IMPLANT
DERMABOND ADVANCED (GAUZE/BANDAGES/DRESSINGS) ×2
DERMABOND ADVANCED .7 DNX12 (GAUZE/BANDAGES/DRESSINGS) ×2 IMPLANT
DEVICE SUT QUICK LOAD TK 5 (STAPLE) ×6 IMPLANT
DEVICE SUT TI-KNOT TK 5X26 (MISCELLANEOUS) ×3 IMPLANT
DEVICE SUTURE ENDOST 10MM (ENDOMECHANICALS) ×4 IMPLANT
DEVICE TI KNOT TK5 (MISCELLANEOUS) ×1
DEVICE TROCAR PUNCTURE CLOSURE (ENDOMECHANICALS) ×4 IMPLANT
DRAPE CAMERA CLOSED 9X96 (DRAPES) ×4 IMPLANT
DRAPE UTILITY XL STRL (DRAPES) ×8 IMPLANT
ELECT REM PT RETURN 9FT ADLT (ELECTROSURGICAL) ×4
ELECTRODE REM PT RTRN 9FT ADLT (ELECTROSURGICAL) ×2 IMPLANT
GAUZE SPONGE 4X4 12PLY STRL (GAUZE/BANDAGES/DRESSINGS) IMPLANT
GLOVE BIOGEL M STRL SZ7.5 (GLOVE) ×16 IMPLANT
GOWN STRL REUS W/TWL XL LVL3 (GOWN DISPOSABLE) ×16 IMPLANT
HOVERMATT SINGLE USE (MISCELLANEOUS) ×4 IMPLANT
KIT BASIN OR (CUSTOM PROCEDURE TRAY) ×4 IMPLANT
NEEDLE SPNL 22GX3.5 QUINCKE BK (NEEDLE) ×4 IMPLANT
PACK UNIVERSAL I (CUSTOM PROCEDURE TRAY) ×4 IMPLANT
PEN SKIN MARKING BROAD (MISCELLANEOUS) ×4 IMPLANT
QUICK LOAD TK 5 (STAPLE) ×2
RELOAD STAPLER BLUE 60MM (STAPLE) ×8 IMPLANT
RELOAD STAPLER GOLD 60MM (STAPLE) IMPLANT
RELOAD STAPLER GREEN 60MM (STAPLE) ×4 IMPLANT
SCISSORS LAP 5X45 EPIX DISP (ENDOMECHANICALS) ×4 IMPLANT
SEALANT SURGICAL APPL DUAL CAN (MISCELLANEOUS) IMPLANT
SET IRRIG TUBING LAPAROSCOPIC (IRRIGATION / IRRIGATOR) ×4 IMPLANT
SHEARS CURVED HARMONIC AC 45CM (MISCELLANEOUS) ×4 IMPLANT
SLEEVE ADV FIXATION 5X100MM (TROCAR) ×8 IMPLANT
SLEEVE GASTRECTOMY 36FR VISIGI (MISCELLANEOUS) ×4 IMPLANT
SLEEVE XCEL OPT CAN 5 100 (ENDOMECHANICALS) IMPLANT
SOLUTION ANTI FOG 6CC (MISCELLANEOUS) ×4 IMPLANT
SPONGE LAP 18X18 X RAY DECT (DISPOSABLE) ×4 IMPLANT
STAPLER ECHELON BIOABSB 60 FLE (MISCELLANEOUS) ×24 IMPLANT
STAPLER ECHELON LONG 60 440 (INSTRUMENTS) ×4 IMPLANT
STAPLER RELOAD BLUE 60MM (STAPLE) ×16
STAPLER RELOAD GOLD 60MM (STAPLE)
STAPLER RELOAD GREEN 60MM (STAPLE) ×8
SUT MNCRL AB 4-0 PS2 18 (SUTURE) ×4 IMPLANT
SUT SURGIDAC NAB ES-9 0 48 120 (SUTURE) IMPLANT
SUT VICRYL 0 TIES 12 18 (SUTURE) ×4 IMPLANT
SYR 10ML ECCENTRIC (SYRINGE) ×4 IMPLANT
SYR 20CC LL (SYRINGE) ×4 IMPLANT
SYR 50ML LL SCALE MARK (SYRINGE) ×4 IMPLANT
TOWEL OR 17X26 10 PK STRL BLUE (TOWEL DISPOSABLE) ×4 IMPLANT
TOWEL OR NON WOVEN STRL DISP B (DISPOSABLE) ×4 IMPLANT
TRAY FOLEY W/METER SILVER 14FR (SET/KITS/TRAYS/PACK) IMPLANT
TRAY FOLEY W/METER SILVER 16FR (SET/KITS/TRAYS/PACK) IMPLANT
TROCAR ADV FIXATION 5X100MM (TROCAR) ×4 IMPLANT
TROCAR BLADELESS 15MM (ENDOMECHANICALS) ×4 IMPLANT
TROCAR BLADELESS OPT 5 100 (ENDOMECHANICALS) ×4 IMPLANT
TUBING CONNECTING 10 (TUBING) ×3 IMPLANT
TUBING CONNECTING 10' (TUBING) ×1
TUBING ENDO SMARTCAP (MISCELLANEOUS) ×4 IMPLANT
TUBING FILTER THERMOFLATOR (ELECTROSURGICAL) ×4 IMPLANT

## 2015-08-13 NOTE — Anesthesia Procedure Notes (Signed)
Procedure Name: Intubation Date/Time: 08/13/2015 1:30 PM Performed by: Tagg Eustice, Virgel Gess Pre-anesthesia Checklist: Patient identified, Emergency Drugs available, Suction available, Patient being monitored and Timeout performed Patient Re-evaluated:Patient Re-evaluated prior to inductionOxygen Delivery Method: Circle system utilized Preoxygenation: Pre-oxygenation with 100% oxygen Intubation Type: IV induction Ventilation: Mask ventilation without difficulty Laryngoscope Size: Mac and 3 Grade View: Grade II Tube type: Oral Tube size: 7.5 mm Number of attempts: 1 Airway Equipment and Method: Stylet Placement Confirmation: ETT inserted through vocal cords under direct vision,  positive ETCO2,  CO2 detector and breath sounds checked- equal and bilateral Secured at: 21 cm Tube secured with: Tape Dental Injury: Teeth and Oropharynx as per pre-operative assessment

## 2015-08-13 NOTE — Anesthesia Postprocedure Evaluation (Signed)
  Anesthesia Post-op Note  Patient: Britainy D Broshears  Procedure(s) Performed: Procedure(s) (LRB): LAPAROSCOPIC GASTRIC SLEEVE RESECTION WITH HIATAL HERNIA REPAIR (N/A) UPPER GI ENDOSCOPY  Patient Location: PACU  Anesthesia Type: General  Level of Consciousness: awake and alert   Airway and Oxygen Therapy: Patient Spontanous Breathing  Post-op Pain: mild  Post-op Assessment: Post-op Vital signs reviewed, Patient's Cardiovascular Status Stable, Respiratory Function Stable, Patent Airway and No signs of Nausea or vomiting  Last Vitals:  Filed Vitals:   08/13/15 1729  BP: 134/70  Pulse: 76  Temp: 36.6 C  Resp: 20    Post-op Vital Signs: stable   Complications: No apparent anesthesia complications

## 2015-08-13 NOTE — Op Note (Signed)
08/13/2015 Shannon [name] 10/26/1965 [id number]   PRE-OPERATIVE DIAGNOSIS:   Obesity BMI 37 Hiatal hernia OSA on CPAP Diabetes Mellitus 2 Hypertriglyceridemia  Hypercholesterolemia Osteoarthritis lumbar spine  POST-OPERATIVE DIAGNOSIS:  same  PROCEDURE:  Procedure(s): LAPAROSCOPIC SLEEVE GASTRECTOMY WITH HIATAL HERNIA REPAIR UPPER GI ENDOSCOPY  SURGEON:  Surgeon(s): Gayland Curry, MD FACS FASMBS  ASSISTANTS: Gurney Maxin MD   ANESTHESIA:   general  DRAINS: none   BOUGIE: 36 fr ViSiGi  LOCAL MEDICATIONS USED:  MARCAINE + Exparel  SPECIMEN:  Source of Specimen:  Greater curvature of stomach  DISPOSITION OF SPECIMEN:  PATHOLOGY  COUNTS:  YES  INDICATION FOR PROCEDURE: This is a very pleasant 49 year old morbidly obese female who has had unsuccessful attempts for sustained weight loss. She presents today for a planned laparoscopic sleeve gastrectomy with upper endoscopy & possible hiatal hernia repair. We have discussed the risk and benefits of the procedure extensively preoperatively. Please see my separate notes.  PROCEDURE: After obtaining informed consent and receiving 5000 units of subcutaneous heparin, the patient was brought to the operating room at [facility] and placed supine on the operating room table. General endotracheal anesthesia was established. Sequential compression devices were placed. A orogastric tube was placed. The patient's abdomen was prepped and draped in the usual standard surgical fashion. She received preoperative IV antibiotics. A surgical timeout was performed.  Access to the abdomen was achieved using a 5 mm 0 laparoscope thru a 5 mm trocar In the left upper Quadrant 2 fingerbreadths below the left subcostal margin using the Optiview technique. Pneumoperitoneum was smoothly established up to 15 mm of mercury. The laparoscope was advanced and the abdominal cavity was surveilled. The patient was then placed in reverse Trendelenburg. There was  evidence of a hiatal hernia on laparoscopy - gap in the left and right crus anteriorly.  A 5 mm trocar was placed slightly above and to the left of the umbilicus under direct visualization.  The [facility] liver retractor was placed under the left lobe of the liver through a 5 mm trocar incision site in the subxiphoid position. A 5 mm trocar was placed in the lateral right upper quadrant along with a 15 mm trocar in the mid right abdomen. A final 5 mm trocar was placed in the lateral LUQ.  All under direct visualization after local had been infiltrated.  The stomach was inspected. It was completely decompressed and the orogastric tube was removed. She had evidence of a sliding hiatal hernia on preop UGI.  There was a small anterior dimple that was obviously visible. The calibration tube was placed in the oropharynx and guided down into the stomach by the CRNA. 10 mL of air was insufflated into the calibration balloon. The calibration tubing was then gently pulled back by the CRNA and it slid past the GE junction. At this point the calibration tubing was desufflated and pulled back into the esophagus. This confirmed my suspicion of a clinically significant hiatal hernia. The gastrohepatic ligament was incised with harmonic scalpel. The right crus was identified. We identified the crossing fat along the right crus. The adipose tissue just above this area was incised with harmonic scalpel. I then bluntly dissected out this area and identified the left crus. There was evidence of a hiatal hernia. I then mobilized the esophagus. The left and right crus were further mobilized with blunt dissection. I was then able to reapproximate the left and right crus with 0 Ethibond using an Endostitch suture device and securing it  with a titanium tyknot. I placed a second suture in a similar fashion. We then had the CRNA readvanced the calibration tubing back into the stomach. 10 mL of air was insufflated into the calibration tube  balloon. The calibration tube was then gently pulled back and there was resistance at the GE junction. The tube did not slide back up into the esophagus. At this point the calibration tubing was deflated and removed from the patient's body.  We identified the pylorus and measured 6 cm proximal to the pylorus and identified an area of where we would start taking down the short gastric vessels. Harmonic scalpel was used to take down the short gastric vessels along the greater curvature of the stomach. We were able to enter the lesser sac. We continued to march along the greater curvature of the stomach taking down the short gastrics. As we approached the gastrosplenic ligament we took care in this area not to injure the spleen. We were able to take down the entire gastrosplenic ligament. We then mobilized the fundus away from the left crus of diaphragm. There were not any significant posterior gastric avascular attachments. This left the stomach completely mobilized. No vessels had been taken down along the lesser curvature of the stomach.  We then reidentified the pylorus. A 36Fr ViSiGi was then placed in the oropharynx and advanced down into the stomach and placed in the distal antrum and positioned along the lesser curvature. It was placed under suction which secured the 36Fr ViSiGi in place along the lesser curve. Then using the Ethicon echelon 60 mm stapler with a green load with Seamguard, I placed a stapler along the antrum approximately 5 cm from the pylorus. The stapler was angled so that there is ample room at the angularis incisura. I then fired the first staple load after inspecting it posteriorly to ensure adequate space both anteriorly and posteriorly. At this point I still was not completely past the angularis so with another green load with Seamguard, I placed the stapler in position just inside the prior stapleline. We then rotated the stomach to insure that there was adequate anteriorly as well  as posteriorly. The stapler was then fired. At this point I started using 60 mm blue load staple cartridges with Seamguard. Her stomach did not seem that thick at this point. The echelon stapler was then repositioned with a 60 mm blue load with Seamguard and we continued to march up along the [place]. My assistant was holding traction along the greater curvature stomach along the cauterized short gastric vessels ensuring that the stomach was symmetrically retracted. Prior to each firing of the staple, we rotated the stomach to ensure that there is adequate stomach left.  As we approached the fundus, I used 60 mm blue cartridge with Seamguard aiming slightly lateral to the esophageal fat pad. Although the staples on this fire had completely gone thru the last part of the stomach it had not completely cut it. Therefore 1 additional 60 blue load was used to free the remaining stomach. The sleeve was inspected. There is no evidence of cork screw. The staple line appeared hemostatic. The CRNA inflated the ViSiGi to the green zone and the upper abdomen was flooded with saline. There were no bubbles. The sleeve was decompressed and the ViSiGi removed. My assistant scrubbed out and performed an upper endoscopy. The sleeve easily distended with air and the scope was easily advanced to the pylorus. There is no evidence of internal bleeding or cork screwing.  There was no narrowing at the angularis. There is no evidence of bubbles. Please see his operative note for further details. The gastric sleeve was decompressed and the endoscope was removed.  The greater curvature the stomach was grasped with a laparoscopic grasper and removed from the 15 mm trocar site.  The liver retractor was removed. I then closed the 15 mm trocar site with 1 interrupted 0 Vicryl sutures through the fascia using the endoclose. The closure was viewed laparoscopically and it was airtight. 70 cc of Exparel was then infiltrated in the preperitoneal spaces  around the trocar sites. Pneumoperitoneum was released. All trocar sites were closed with a 4-0 Monocryl in a subcuticular fashion followed by the application of Dermabond. The patient was extubated and taken to the recovery room in stable condition. All needle, instrument, and sponge counts were correct x2. There are no immediate complications  (2) 60 mm green with Seamguard (4) 60 mm blue with seamguard  PLAN OF CARE: Admit to inpatient   PATIENT DISPOSITION:  PACU - hemodynamically stable.   Delay start of Pharmacological VTE agent (>24hrs) due to surgical blood loss or risk of bleeding:  no  [name]. Redmond Pulling, MD, FACS FASMBS General, Bariatric, & Minimally Invasive Surgery [facility], [place]

## 2015-08-13 NOTE — Op Note (Signed)
Preoperative diagnosis: laparoscopic sleeve gastrectomy  Postoperative diagnosis: Same   Procedure: Upper endoscopy   Surgeon: Luke Kinsinger, M.D.  Anesthesia: Gen.   Indications for procedure: This patient was undergoing a laparoscopic sleeve gastrectomy.   Description of procedure: The endoscopy was placed in the mouth and into the oropharynx and under endoscopic vision it was advanced to the esophagogastric junction. The pouch was insufflated and no bleeding or bubbles were seen. The GEJ was identified at 38 cm from the teeth. No bleeding or leaks were detected. The scope was withdrawn without difficulty.   Luke Kinsinger, M.D. General, Bariatric, & Minimally Invasive Surgery [facility]    

## 2015-08-13 NOTE — Anesthesia Preprocedure Evaluation (Signed)
Anesthesia Evaluation  Patient identified by MRN, date of birth, ID band Patient awake    Reviewed: Allergy & Precautions, H&P , NPO status , Patient's Chart, lab work & pertinent test results  History of Anesthesia Complications (+) PONV  Airway Mallampati: III  TM Distance: >3 FB Neck ROM: full    Dental no notable dental hx. (+) Dental Advisory Given, Teeth Intact   Pulmonary neg pulmonary ROS, shortness of breath and with exertion, sleep apnea and Continuous Positive Airway Pressure Ventilation ,    Pulmonary exam normal breath sounds clear to auscultation       Cardiovascular Exercise Tolerance: Good negative cardio ROS Normal cardiovascular exam Rhythm:regular Rate:Normal     Neuro/Psych Anxiety Depression negative neurological ROS  negative psych ROS   GI/Hepatic negative GI ROS, Neg liver ROS, hiatal hernia, GERD  Medicated and Controlled,  Endo/Other  diabetes, Well Controlled, Type 2, Oral Hypoglycemic AgentsHypothyroidism   Renal/GU negative Renal ROS  negative genitourinary   Musculoskeletal   Abdominal   Peds  Hematology negative hematology ROS (+)   Anesthesia Other Findings   Reproductive/Obstetrics negative OB ROS                             Anesthesia Physical Anesthesia Plan  ASA: III  Anesthesia Plan: General   Post-op Pain Management:    Induction: Intravenous  Airway Management Planned: Oral ETT  Additional Equipment:   Intra-op Plan:   Post-operative Plan: Extubation in OR  Informed Consent: I have reviewed the patients History and Physical, chart, labs and discussed the procedure including the risks, benefits and alternatives for the proposed anesthesia with the patient or authorized representative who has indicated his/her understanding and acceptance.   Dental Advisory Given  Plan Discussed with: CRNA and Surgeon  Anesthesia Plan Comments:          Anesthesia Quick Evaluation

## 2015-08-13 NOTE — Progress Notes (Addendum)
Pt has intertrigo like rash in folds of skin right groin  Called to [place] in OR/note on chart

## 2015-08-13 NOTE — Transfer of Care (Signed)
Immediate Anesthesia Transfer of Care Note  Patient: Shannon [name]  Procedure(s) Performed: Procedure(s): LAPAROSCOPIC GASTRIC SLEEVE RESECTION WITH HIATAL HERNIA REPAIR (N/A) UPPER GI ENDOSCOPY  Patient Location: PACU  Anesthesia Type:General  Level of Consciousness: awake, alert , oriented and patient cooperative  Airway & Oxygen Therapy: Patient Spontanous Breathing and Patient connected to face mask oxygen  Post-op Assessment: Report given to RN, Post -op Vital signs reviewed and stable and Patient moving all extremities  Post vital signs: Reviewed and stable  Last Vitals:  Filed Vitals:   08/13/15 1137  BP: 125/70  Pulse: 65  Temp: 36.4 C  Resp: 16    Complications: No apparent anesthesia complications

## 2015-08-13 NOTE — Interval H&P Note (Signed)
History and Physical Interval Note:  08/13/2015 12:43 PM  Shannon [name]  has presented today for surgery, with the diagnosis of Morbid Obesity  The various methods of treatment have been discussed with the patient and family. After consideration of risks, benefits and other options for treatment, the patient has consented to  Procedure(s): LAPAROSCOPIC GASTRIC SLEEVE RESECTION WITH HIATAL HERNIA REPAIR (N/A) as a surgical intervention .  The patient's history has been reviewed, patient examined, no change in status, stable for surgery.  I have reviewed the patient's chart and labs.  Questions were answered to the patient's satisfaction.    [name]. Redmond Pulling, MD, Jay, Bariatric, & Minimally Invasive Surgery [facility], [place]   [facility] M

## 2015-08-13 NOTE — H&P (View-Only) (Signed)
Shannon [name] 07/26/2015 9:35 AM Location: [facility] Patient #: [id number] DOB: 24-Aug-1966 Divorced / Language: Shannon [name] / Race: White Female History of Present Illness Shannon [name] M. Shannon Hollibaugh [name]; 07/26/2015 9:44 AM) Patient words: bari preop.  The patient is a 49 year old female who presents for a pre-op visit. She comes in today for her preoperative appointment. She has been approved for laparoscopic sleeve gastrectomy with possible hiatal hernia repair and upper endoscopy. I initially met her on May 13. Her weight at that time was 228 pounds. She denies any changes to her medical history since I initially met her. She denies any chest pain, chest pressure, shortness of breath, dyspnea on exertion, orthopnea, abdominal pain, diarrhea, or constipation. She denies any lightheadedness or dizziness. She denies any chest pain presents a hospital.  She does have some occasional reflux.  Her preoperative workup showed a small hiatal hernia on her upper GI. Her chest x-Hu was normal. Her CBC and other labs were within normal limits. Her vitamin D level was borderline at 31. Problem List/Past Medical Shannon [name] Shannon Derby, [name]; 07/26/2015 9:44 AM) OBESITY (BMI 30-39.9) (E66.9)  Other Problems Shannon Curry, [name]; 07/26/2015 9:44 AM) OBSTRUCTIVE SLEEP APNEA ON CPAP (G47.33) ELEVATED TRIGLYCERIDES WITH HIGH CHOLESTEROL (E78.2) OSTEOARTHRITIS OF LUMBAR SPINE, UNSPECIFIED SPINAL OSTEOARTHRITIS COMPLICATION STATUS (V95.638) HYPOTHYROIDISM, ADULT (E03.9) DIABETES MELLITUS TYPE 2 IN OBESE (E11.9) Depression Back Pain Hemorrhoids Anxiety Disorder  Past Surgical History Shannon Curry, [name]; 07/26/2015 9:44 AM) Oral Surgery Foot Surgery Bilateral. Cesarean Section - 1 Appendectomy  Diagnostic Studies History Shannon Curry, [name]; 07/26/2015 9:44 AM) Mammogram within last year Colonoscopy >10 years ago Pap Smear 1-5 years ago  Allergies (Shannon Eversole, [name]; 75/03/4331 9:51 AM) Shannon [name]  *ADHD/ANTI-NARCOLEPSY/ANTI-OBESITY/ANOREXIANTS* Shannon [name] *ANTIPSYCHOTICS/ANTIMANIC AGENTS* Lipitor *ANTIHYPERLIPIDEMICS* Contrave *ADHD/ANTI-NARCOLEPSY/ANTI-OBESITY/ANOREXIANTS*  Medication History (Shannon Eversole, [name]; 88/01/1659 6:30 AM) Xanax (0.5MG Tablet, Oral) Active. Citalopram Hydrobromide (40MG Tablet, Oral) Active. Levothyroxine Sodium (25MCG Tablet, Oral) Active. MetFORMIN HCl (500MG Tablet, Oral) Active. Simvastatin (20MG Tablet, Oral) Active. Medications Reconciled  Social History Shannon Curry, [name]; 07/26/2015 9:44 AM) No drug use Caffeine use Carbonated beverages, Tea. Tobacco use Never smoker. Alcohol use Occasional alcohol use.  Family History Shannon Curry, [name]; 07/26/2015 9:44 AM) Colon Polyps Mother. Arthritis Mother. Diabetes Mellitus Father. Depression Mother, Sister. Respiratory Condition Father. Hypertension Father.  Pregnancy / Birth History Shannon Curry, [name]; 07/26/2015 9:44 AM) Shannon [name] 1 Maternal age 71-35 Regular periods Gravida 1 Age at menarche 7 years.     Review of Systems Shannon [name] M. Shannon Hughlett [name]; 07/26/2015 9:40 AM) General Present- Fatigue, Night Sweats and Weight Gain. Not Present- Appetite Loss, Chills, Fever and Weight Loss. Skin Present- Dryness. Not Present- Change in Wart/Mole, Hives, Jaundice, New Lesions, Non-Healing Wounds, Rash and Ulcer. HEENT Not Present- Earache, Hearing Loss, Hoarseness, Nose Bleed, Oral Ulcers, Ringing in the Ears, Seasonal Allergies, Sinus Pain, Sore Throat, Visual Disturbances, Wears glasses/contact lenses and Yellow Eyes. Respiratory Not Present- Bloody sputum, Chronic Cough, Difficulty Breathing, Snoring and Wheezing. Breast Not Present- Breast Mass, Breast Pain, Nipple Discharge and Skin Changes. Cardiovascular Present- Swelling of Extremities. Not Present- Chest Pain, Difficulty Breathing Lying Down, Leg Cramps, Palpitations, Rapid Heart Rate and Shortness of Breath. Gastrointestinal  Present- Bloating. Not Present- Abdominal Pain, Bloody Stool, Change in Bowel Habits, Chronic diarrhea, Constipation, Difficulty Swallowing, Excessive gas, Gets full quickly at meals, Hemorrhoids, Indigestion, Nausea, Rectal Pain and Vomiting. Female Genitourinary Present- Urgency. Not Present- Frequency, Nocturia, Painful Urination and Pelvic Pain. Musculoskeletal Present- Back Pain and Muscle Weakness.  Not Present- Joint Pain, Joint Stiffness, Muscle Pain and Swelling of Extremities. Neurological Present- Numbness and Tingling. Not Present- Decreased Memory, Fainting, Headaches, Seizures, Tremor, Trouble walking and Weakness. Psychiatric Present- Anxiety and Depression. Not Present- Bipolar, Change in Sleep Pattern, Fearful and Frequent crying. Endocrine Present- Hot flashes. Not Present- Cold Intolerance, Excessive Hunger, Hair Changes, Heat Intolerance and New Diabetes. Hematology Not Present- Easy Bruising, Excessive bleeding, Gland problems, HIV and Persistent Infections.  Vitals (Shannon [name]; 53/03/6439 3:47 AM) 07/26/2015 9:35 AM Weight: 243.6 lb Height: 65.5in Body Surface Area: 2.26 m Body Mass Index: 39.92 kg/m Temp.: 98.64F(Oral)  Pulse: 74 (Regular)  BP: 144/88 (Sitting, Left Arm, Standard)     Physical Exam Shannon [name] M. Shannon [name]; 07/26/2015 9:40 AM)  General Mental Status-Alert. General Appearance-Consistent with stated age. Hydration-Well hydrated. Voice-Normal. Note: obese   Head and Neck Head-normocephalic, atraumatic with no lesions or palpable masses. Trachea-midline. Thyroid Gland Characteristics - normal size and consistency.  Eye Eyeball - Bilateral-Extraocular movements intact. Sclera/Conjunctiva - Bilateral-No scleral icterus.  Chest and Lung Exam Chest and lung exam reveals -quiet, even and easy respiratory effort with no use of accessory muscles and on auscultation, normal breath sounds, no adventitious sounds and  normal vocal resonance. Inspection Chest Wall - Normal. Back - normal.  Breast - Did not examine.  Cardiovascular Cardiovascular examination reveals -normal heart sounds, regular rate and rhythm with no murmurs and normal pedal pulses bilaterally.  Abdomen Inspection Inspection of the abdomen reveals - No Hernias. Skin - Scar - Note: old c/s scar. Palpation/Percussion Palpation and Percussion of the abdomen reveal - Soft, Non Tender, No Rebound tenderness, No Rigidity (guarding) and No hepatosplenomegaly. Auscultation Auscultation of the abdomen reveals - Bowel sounds normal.  Peripheral Vascular Upper Extremity Palpation - Pulses bilaterally normal.  Neurologic Neurologic evaluation reveals -alert and oriented x 3 with no impairment of recent or remote memory. Mental Status-Normal.  Neuropsychiatric The patient's mood and affect are described as -normal. Judgment and Insight-insight is appropriate concerning matters relevant to self.  Musculoskeletal Normal Exam - Left-Upper Extremity Strength Normal and Lower Extremity Strength Normal. Normal Exam - Right-Upper Extremity Strength Normal and Lower Extremity Strength Normal. Note: bilateral knee crepitus   Lymphatic Head & Neck  General Head & Neck Lymphatics: Bilateral - Description - Normal. Axillary - Did not examine. Femoral & Inguinal - Did not examine.    Assessment & Plan Shannon [name] M. Amarius Toto [name]; 07/26/2015 9:44 AM)  OBESITY (BMI 30-39.9) (E66.9) Impression: We reviewed her preoperative workup. We discussed the finding of a hiatal hernia. We discussed that we would test for one during surgery. If found to have a clinically significant hiatal hernia I recommended proceeding with repair at the time of surgery. We discussed what that would involve including the risk and benefits. We discussed the preoperative diet. We've rediscussed the typical postoperative course. She was given postoperative prescriptions  (on paper) for pain, reflux, and nausea medications today. I encouraged her to contact the office should she have any questions between now and surgery.  Current Plans Pt Education - EMW_preopbariatric OSTEOARTHRITIS OF LUMBAR SPINE, UNSPECIFIED SPINAL OSTEOARTHRITIS COMPLICATION STATUS (Q25.956) Impression: per ortho [name]  HYPOTHYROIDISM, ADULT (E03.9) Impression: per pcp  OBSTRUCTIVE SLEEP APNEA ON CPAP (G47.33) Impression: per pcp  ELEVATED TRIGLYCERIDES WITH HIGH CHOLESTEROL (E78.2) Impression: per pcp  DIABETES MELLITUS TYPE 2 IN OBESE (E11.9) Impression: per pcp  [name]. Redmond Pulling, [name], FACS General, Bariatric, & Minimally Invasive Surgery [facility], [place]

## 2015-08-14 ENCOUNTER — Inpatient Hospital Stay (HOSPITAL_COMMUNITY): Payer: 59

## 2015-08-14 LAB — GLUCOSE, CAPILLARY
GLUCOSE-CAPILLARY: 122 mg/dL — AB (ref 65–99)
GLUCOSE-CAPILLARY: 82 mg/dL (ref 65–99)
GLUCOSE-CAPILLARY: 99 mg/dL (ref 65–99)
Glucose-Capillary: 144 mg/dL — ABNORMAL HIGH (ref 65–99)
Glucose-Capillary: 97 mg/dL (ref 65–99)
Glucose-Capillary: 99 mg/dL (ref 65–99)
Glucose-Capillary: 79 mg/dL (ref 65–99)

## 2015-08-14 LAB — CBC WITH DIFFERENTIAL/PLATELET
BASOS ABS: 0 10*3/uL (ref 0.0–0.1)
BASOS PCT: 0 %
EOS ABS: 0 10*3/uL (ref 0.0–0.7)
EOS PCT: 0 %
HCT: 40.2 % (ref 36.0–46.0)
Hemoglobin: 12.8 g/dL (ref 12.0–15.0)
Lymphocytes Relative: 18 %
Lymphs Abs: 2.1 10*3/uL (ref 0.7–4.0)
MCH: 29.1 pg (ref 26.0–34.0)
MCHC: 31.8 g/dL (ref 30.0–36.0)
MCV: 91.4 fL (ref 78.0–100.0)
Monocytes Absolute: 0.6 10*3/uL (ref 0.1–1.0)
Monocytes Relative: 5 %
Neutro Abs: 9.1 10*3/uL — ABNORMAL HIGH (ref 1.7–7.7)
Neutrophils Relative %: 77 %
PLATELETS: 379 10*3/uL (ref 150–400)
RBC: 4.4 MIL/uL (ref 3.87–5.11)
RDW: 13.3 % (ref 11.5–15.5)
WBC: 11.8 10*3/uL — AB (ref 4.0–10.5)

## 2015-08-14 LAB — COMPREHENSIVE METABOLIC PANEL
ALBUMIN: 4 g/dL (ref 3.5–5.0)
ALT: 78 U/L — AB (ref 14–54)
AST: 69 U/L — AB (ref 15–41)
Alkaline Phosphatase: 67 U/L (ref 38–126)
Anion gap: 9 (ref 5–15)
BUN: 8 mg/dL (ref 6–20)
CHLORIDE: 101 mmol/L (ref 101–111)
CO2: 26 mmol/L (ref 22–32)
CREATININE: 0.7 mg/dL (ref 0.44–1.00)
Calcium: 8.3 mg/dL — ABNORMAL LOW (ref 8.9–10.3)
GFR calc non Af Amer: >60 mL/min (ref >60)
Glucose, Bld: 102 mg/dL — ABNORMAL HIGH (ref 65–99)
Potassium: 4.3 mmol/L (ref 3.5–5.1)
SODIUM: 136 mmol/L (ref 135–145)
Total Bilirubin: 0.7 mg/dL (ref 0.3–1.2)
Total Protein: 7.2 g/dL (ref 6.5–8.1)

## 2015-08-14 LAB — HEMOGLOBIN AND HEMATOCRIT, BLOOD
HEMATOCRIT: 41 % (ref 36.0–46.0)
Hemoglobin: 13 g/dL (ref 12.0–15.0)

## 2015-08-14 MED ORDER — IOHEXOL 300 MG/ML  SOLN
50.0000 mL | Freq: Once | INTRAMUSCULAR | Status: DC | PRN
  Administered 2015-08-14: 50 mL via INTRAVENOUS
  Filled 2015-08-14: qty 50

## 2015-08-14 MED ORDER — KETOROLAC TROMETHAMINE 30 MG/ML IJ SOLN
30.0000 mg | Freq: Once | INTRAMUSCULAR | Status: AC
  Administered 2015-08-14: 30 mg via INTRAVENOUS
  Filled 2015-08-14: qty 1

## 2015-08-14 NOTE — Care Management Note (Signed)
Case Management Note  Patient Details  Name: Shannon [name] MRN: [id number] Date of Birth: 1966/05/28  Subjective/Objective:    laparoscopic sleeve gastrectomy                Action/Plan: Discharge planning, no new needs  Expected Discharge Date:                  Expected Discharge Plan:  Home/Self Care  In-House Referral:  NA  Discharge planning Services  CM Consult  Post Acute Care Choice:  NA Choice offered to:  NA  DME Arranged:  N/A DME Agency:  NA  HH Arranged:  NA HH Agency:  NA  Status of Service:  Completed, signed off  Medicare Important Message Given:    Date Medicare IM Given:    Medicare IM give by:    Date Additional Medicare IM Given:    Additional Medicare Important Message give by:     If discussed at [place] of Stay Meetings, dates discussed:    Additional Comments:  Guadalupe Maple, RN 08/14/2015, 2:13 PM

## 2015-08-14 NOTE — Progress Notes (Signed)
1 Day Post-Op  Subjective: Just got back from postop UGI. Had a fair amount of post neck pain around c7 last night, this am. Some nausea. abd pain . ambulated Objective: Vital signs in last 24 hours: Temp:  [97.6 F (36.4 C)-98.9 F (37.2 C)] 98.9 F (37.2 C) (10/25 1014) Pulse Rate:  [63-96] 64 (10/25 1014) Resp:  [14-25] 18 (10/25 1014) BP: (125-168)/(62-94) 149/67 mmHg (10/25 1014) SpO2:  [91 %-100 %] 100 % (10/25 1014) Last BM Date: 08/12/15  Intake/Output from previous day: 10/24 0701 - 10/25 0700 In: 3102.5 [I.V.:2937.5; IV Piggyback:165] Out: 1480 [Urine:1450; Emesis/NG output:5; Blood:25] Intake/Output this shift:    Alert, nontoxic Midline neck TTP at around c7. No obvious trauma. No neuro deficits cta b/l Reg Soft, mild TTP, incisions c/d/i +SCDs  Lab Results:   Recent Labs  08/13/15 1600 08/14/15 0546  WBC  --  11.8*  HGB 12.9 12.8  HCT 39.3 40.2  PLT  --  379   BMET  Recent Labs  08/14/15 0546  NA 136  K 4.3  CL 101  CO2 26  GLUCOSE 102*  BUN 8  CREATININE 0.70  CALCIUM 8.3*   PT/INR No results for input(s): LABPROT, INR in the last 72 hours. ABG No results for input(s): PHART, HCO3 in the last 72 hours.  Invalid input(s): PCO2, PO2  Studies/Results: No results found.  Anti-infectives: Anti-infectives    Start     Dose/Rate Route Frequency Ordered Stop   08/13/15 0852  cefoTEtan in Dextrose 5% (CEFOTAN) IVPB 2 g     2 g Intravenous On call to O.R. 08/13/15 0852 08/13/15 1330      Assessment/Plan: s/p Procedure(s): LAPAROSCOPIC GASTRIC SLEEVE RESECTION WITH HIATAL HERNIA REPAIR (N/A) UPPER GI ENDOSCOPY Vitals ok. UGI looks ok to me. Start pOD 1 diet Neck pain - could be CO2 gas, positional during surgery? Cold pack. toradol x 1, muscle relaxant Ambulate, pulm toilet vte prophylaxis  Shannon Ruff. [place] Pull[place], Shannon [name], [name]ally Invasive [facility], [place]   LOS: 1 day    Shannon [name] 08/14/2015

## 2015-08-14 NOTE — Plan of Care (Signed)
Problem: Food- and Nutrition-Related Knowledge Deficit (NB-1.1) Goal: Nutrition education Formal process to instruct or train a patient/client in a skill or to impart knowledge to help patients/clients voluntarily manage or modify food choices and eating behavior to maintain or improve health. Outcome: Completed/Met Date Met:  08/14/15 Nutrition Education Note  Received consult for diet education per DROP protocol.   Discussed 2 week post op diet with pt. Emphasized that liquids must be non carbonated, non caffeinated, and sugar free. Fluid goals discussed. Pt to follow up with outpatient bariatric RD for further diet progression after 2 weeks. Multivitamins and minerals also reviewed. Teach back method used, pt expressed understanding, expect good compliance.   Diet: First 2 Weeks  You will see the nutritionist about two (2) weeks after your surgery. The nutritionist will increase the types of foods you can eat if you are handling liquids well:  If you have severe vomiting or nausea and cannot handle clear liquids lasting longer than 1 day, call your surgeon  Protein Shake  Drink at least 2 ounces of shake 5-6 times per day  Each serving of protein shakes (usually 8 - 12 ounces) should have a minimum of:  15 grams of protein  And no more than 5 grams of carbohydrate  Goal for protein each day:  Men = 80 grams per day  Women = 60 grams per day  Protein powder may be added to fluids such as non-fat milk or Lactaid milk or Soy milk (limit to 35 grams added protein powder per serving)   Hydration  Slowly increase the amount of water and other clear liquids as tolerated (See Acceptable Fluids)  Slowly increase the amount of protein shake as tolerated  Sip fluids slowly and throughout the day  May use sugar substitutes in small amounts (no more than 6 - 8 packets per day; i.e. Splenda)   Fluid Goal  The first goal is to drink at least 8 ounces of protein shake/drink per day (or as directed  by the nutritionist); some examples of protein shakes are Syntrax Nectar, Adkins Advantage, EAS Edge HP, and Unjury. See handout from pre-op Bariatric Education Class:  Slowly increase the amount of protein shake you drink as tolerated  You may find it easier to slowly sip shakes throughout the day  It is important to get your proteins in first  Your fluid goal is to drink 64 - 100 ounces of fluid daily  It may take a few weeks to build up to this  32 oz (or more) should be clear liquids  And  32 oz (or more) should be full liquids (see below for examples)  Liquids should not contain sugar, caffeine, or carbonation   Clear Liquids:  Water or Sugar-free flavored water (i.e. Fruit H2O, Propel)  Decaffeinated coffee or tea (sugar-free)  Crystal Lite, Wyler's Lite, Minute Maid Lite  Sugar-free Jell-O  Bouillon or broth  Sugar-free Popsicle: *Less than 20 calories each; Limit 1 per day   Full Liquids:  Protein Shakes/Drinks + 2 choices per day of other full liquids  Full liquids must be:  No More Than 12 grams of Carbs per serving  No More Than 3 grams of Fat per serving  Strained low-fat cream soup  Non-Fat milk  Fat-free Lactaid Milk  Sugar-free yogurt (Dannon Lite & Fit, Greek yogurt)     Atiyana Welte, MS, RD, LDN Pager: [phone] After Hours Pager: [phone]        

## 2015-08-14 NOTE — Progress Notes (Signed)
Patient alert and oriented, Post op day 1.  Provided support and encouragement.  Encouraged pulmonary toilet, ambulation and small sips of liquids when swallow study returned satisfactorily.  All questions answered.  Will continue to monitor. 

## 2015-08-15 LAB — CBC WITH DIFFERENTIAL/PLATELET
Basophils Absolute: 0 10*3/uL (ref 0.0–0.1)
Basophils Relative: 0 %
EOS PCT: 2 %
Eosinophils Absolute: 0.1 10*3/uL (ref 0.0–0.7)
HCT: 38.4 % (ref 36.0–46.0)
Hemoglobin: 12.2 g/dL (ref 12.0–15.0)
LYMPHS ABS: 2.3 10*3/uL (ref 0.7–4.0)
LYMPHS PCT: 27 %
MCH: 29.4 pg (ref 26.0–34.0)
MCHC: 31.8 g/dL (ref 30.0–36.0)
MCV: 92.5 fL (ref 78.0–100.0)
MONO ABS: 0.5 10*3/uL (ref 0.1–1.0)
MONOS PCT: 6 %
Neutro Abs: 5.6 10*3/uL (ref 1.7–7.7)
Neutrophils Relative %: 65 %
PLATELETS: 346 10*3/uL (ref 150–400)
RBC: 4.15 MIL/uL (ref 3.87–5.11)
RDW: 13.7 % (ref 11.5–15.5)
WBC: 8.6 10*3/uL (ref 4.0–10.5)

## 2015-08-15 LAB — GLUCOSE, CAPILLARY
GLUCOSE-CAPILLARY: 89 mg/dL (ref 65–99)
Glucose-Capillary: 71 mg/dL (ref 65–99)
Glucose-Capillary: 74 mg/dL (ref 65–99)
Glucose-Capillary: 78 mg/dL (ref 65–99)

## 2015-08-15 MED ORDER — OXYCODONE HCL 5 MG/5ML PO SOLN: 5.0000 mg | ORAL | Status: DC | PRN

## 2015-08-15 MED ORDER — METHOCARBAMOL 500 MG PO TABS: 500.0000 mg | ORAL_TABLET | Freq: Four times a day (QID) | ORAL | Status: DC | PRN

## 2015-08-15 MED ORDER — KETOROLAC TROMETHAMINE 30 MG/ML IJ SOLN
30.0000 mg | Freq: Once | INTRAMUSCULAR | Status: AC
  Administered 2015-08-15: 30 mg via INTRAVENOUS
  Filled 2015-08-15: qty 1

## 2015-08-15 NOTE — Progress Notes (Signed)
Patient alert and oriented, pain is controlled. Patient is tolerating fluids,  advanced to protein shake today, patient tolerated well.  Reviewed Gastric sleeve discharge instructions with patient and patient is able to articulate understanding.  Provided information on BELT program, Support Group and [facility] outpatient pharmacy. All questions answered, will continue to monitor.  

## 2015-08-15 NOTE — Discharge Summary (Signed)
Physician Discharge Summary  Shannon [name] TDD:[id number] DOB: 1965-12-22 DOA: 08/13/2015  PCP: Iran Planas, PA-C  Admit date: 08/13/2015 Discharge date: 08/15/2015  Recommendations for Outpatient Follow-up:   Follow-up Information    Follow up with Gayland Curry, MD. Go on 08/29/2015.   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 Please see operative note. Preoperatively the patient was given 5000 units of subcutaneous heparin for DVT prophylaxis. Postoperative prophylactic Lovenox dosing was started on the morning of postoperative day 1. The patient underwent an upper GI on postoperative day 1 which demonstrated no extravasation of contrast and emptying of the contrast into the small bowel. The patient was started on ice chips and water which they tolerated.she complained of a fair amount of neck pain around C7.  I initially attributed to pneumoperitoneum from surgery however it continued onto postoperative day 2.  She denied any neuropathy.  There is no obvious trauma.  It was controlled with muscle relaxant and 2 doses of Toradol. On postoperative day 2 The patient's diet was advanced to protein shakes which they also tolerated. The  patient was ambulating without difficulty. Their vital signs are stable without fever or tachycardia. Their hemoglobin had remained stable. The patient was maintained on their home settings for CPAP therapy. The patient had received discharge instructions and counseling. They were deemed stable for discharge.  BP 163/88 mmHg  Pulse 71  Temp(Src) 98.1 F (36.7 C) (Oral)  Resp 20  Ht 5' 5.5" (1.664 m)  Wt 101.662 kg (224 lb 2 oz)  BMI 36.72 kg/m2  SpO2 98%  LMP 07/23/2015 (Exact Date)  Gen: alert, NAD, non-toxic appearing Pupils: equal, no scleral icterus Pulm: Lungs clear to auscultation, symmetric chest rise CV: regular rate and rhythm Abd: soft, nontender, nondistended. healing trocar sites. No cellulitis. No incisional hernia Ext: no edema, no calf tenderness Skin: no rash, no jaundice    Discharge Instructions  Discharge Instructions    Ambulate hourly while awake    Complete by:  As directed      Call MD for:  difficulty breathing, headache or visual disturbances    Complete by:  As directed      Call MD for:  persistant dizziness or light-headedness    Complete by:  As directed      Call MD for:  persistant nausea and vomiting    Complete by:  As directed      Call MD for:  redness, tenderness, or signs of infection (pain, swelling, redness, odor or green/yellow discharge around incision site)    Complete by:  As directed      Call MD for:  severe uncontrolled pain    Complete by:  As directed  Call MD for:  temperature >101 F    Complete by:  As directed      Diet bariatric full liquid    Complete by:  As directed      Discharge instructions    Complete by:  As directed   See bariatric discharge instructions     Incentive spirometry    Complete by:  As directed   Perform hourly while awake            Medication List    STOP taking these medications        ibuprofen 800 MG tablet  Commonly known as:  ADVIL,MOTRIN     metFORMIN 500 MG tablet   Commonly known as:  GLUCOPHAGE      TAKE these medications        ALPRAZolam 0.5 MG tablet  Commonly known as:  XANAX  Take 1 tablet (0.5 mg total) by mouth at bedtime as needed for anxiety.     AMBULATORY NON FORMULARY MEDICATION  AccuCheck One Touch test strips and lancets Dx: E11.9     BIOTIN PO  Take 1 tablet by mouth daily.     citalopram 40 MG tablet  Commonly known as:  CELEXA  Take 1 tablet (40 mg total) by mouth daily. Take one tablet of 40mg  qd.     lamoTRIgine 200 MG tablet  Commonly known as:  LAMICTAL  Take 1 tablet (200 mg total) by mouth daily.     levothyroxine 25 MCG tablet  Commonly known as:  SYNTHROID, LEVOTHROID  TAKE 1 TABLET (25 MCG TOTAL) BY MOUTH DAILY BEFORE BREAKFAST. Needs appointment for refills.     methocarbamol 500 MG tablet  Commonly known as:  ROBAXIN  Take 1 tablet (500 mg total) by mouth every 6 (six) hours as needed for muscle spasms.     oxyCODONE 5 MG/5ML solution  Commonly known as:  ROXICODONE  Take 5-10 mLs (5-10 mg total) by mouth every 4 (four) hours as needed for moderate pain or severe pain.     simvastatin 20 MG tablet  Commonly known as:  ZOCOR  Take 1 tablet (20 mg total) by mouth at bedtime.           Follow-up Information    Follow up with Gayland Curry, MD. Go on 08/29/2015.   Specialty:  General Surgery   Why:  For Post-Op Check at 11:00   Contact information:   [place] [address] [phone]        The results of significant diagnostics from this hospitalization (including imaging, microbiology, ancillary and laboratory) are listed below for reference.    Significant Diagnostic Studies: Dg Ugi W/water Sol Cm  08/14/2015  CLINICAL DATA:  49 year old female postoperative day 1 from laparoscopic sleeve gastrectomy with hiatal hernia repair. Initial encounter. EXAM: WATER SOLUBLE UPPER GI SERIES TECHNIQUE: Single-column upper GI series was performed using water soluble contrast.  CONTRAST:  10mL OMNIPAQUE IOHEXOL 300 MG/ML  SOLN COMPARISON:  Preoperative upper GI series 616 2016. FLUOROSCOPY TIME:  Radiation Exposure Index (as provided by the fluoroscopic device): If the device does not provide the exposure index: Fluoroscopy Time (in minutes and seconds):  1 minutes 51 seconds Number of Acquired Images:  None FINDINGS: Preprocedural scout view demonstrating a staple line in the left epigastrium. Bowel gas pattern within normal limits. The patient was first given small sips of water which she tolerated well and without difficulty. She was then administered 50 mL Omnipaque  300. No obstruction to the forward flow of contrast from the esophagus into the stomach with prompt distal gastric transit of contrast and prompt opacification of the duodenum C loop and proximal jejunum. Along the lateral contour of the gastroesophageal junction there is a smooth oval outpouching (see series 10 and series 22). This could be related to the hiatal hernia repair. No residual hiatal hernia. No evidence of contrast extravasation. IMPRESSION: 1. Prompt transit of contrast through the stomach and to the proximal jejunum. 2. Small out pouching along the lateral gastric cardia, favor the sequelae of hernia repair. No contrast leak or adverse features identified. Electronically Signed   By: Genevie Ann M.D.   On: 08/14/2015 11:14    Labs: Basic Metabolic Panel:  Recent Labs Lab 08/14/15 0546  NA 136  K 4.3  CL 101  CO2 26  GLUCOSE 102*  BUN 8  CREATININE 0.70  CALCIUM 8.3*   Liver Function Tests:  Recent Labs Lab 08/14/15 0546  AST 69*  ALT 78*  ALKPHOS 67  BILITOT 0.7  PROT 7.2  ALBUMIN 4.0    CBC:  Recent Labs Lab 08/13/15 1600 08/14/15 0546 08/14/15 1603 08/15/15 0503  WBC  --  11.8*  --  8.6  NEUTROABS  --  9.1*  --  5.6  HGB 12.9 12.8 13.0 12.2  HCT 39.3 40.2 41.0 38.4  MCV  --  91.4  --  92.5  PLT  --  379  --  346    CBG:  Recent Labs Lab 08/14/15 2009  08/14/15 2359 08/15/15 0356 08/15/15 0743 08/15/15 1201  GLUCAP 79 71 78 74 89    Principal Problem:   Obesity Active Problems:   Hyperlipidemia   OSA on CPAP   Diabetes mellitus type II, controlled ([place])   DDD (degenerative disc disease), lumbar   Hiatal hernia   S/P laparoscopic sleeve gastrectomy   Time coordinating discharge: 15 min  Signed:  Gayland Curry, MD [facility], [place] 08/15/2015, 12:19 PM

## 2015-08-15 NOTE — Discharge Instructions (Signed)

## 2015-08-20 ENCOUNTER — Telehealth (HOSPITAL_COMMUNITY): Payer: Self-pay

## 2015-08-20 NOTE — Telephone Encounter (Signed)
Made discharge phone call to patient per DROP protocol. Asking the following questions.    1. Do you have someone to care for you now that you are home?  Yes 2. Are you having pain now that is not relieved by your pain medication?  no 3. Are you able to drink the recommended daily amount of fluids (48 ounces minimum/day) and protein (60-80 grams/day) as prescribed by the dietitian or nutritional counselor?  yes 4. Are you taking the vitamins and minerals as prescribed?  yes 5. Do you have the on call number to contact your surgeon if you have a problem or question?  yes 6. Are your incisions free of redness, swelling or drainage? (If steri strips, address that these can fall off, shower as tolerated) no issues 7. Have your bowels moved since your surgery?  If not, are you passing gas?  Moving slowly & has used miralax. May need to get milk of mg if needed but aware to do so. 8. Are you up and walking 3-4 times per day?  yes

## 2015-08-28 ENCOUNTER — Encounter: Payer: 59 | Attending: General Surgery

## 2015-08-28 VITALS — Ht 65.0 in | Wt 211.0 lb

## 2015-08-28 DIAGNOSIS — Z713 Dietary counseling and surveillance: Secondary | ICD-10-CM | POA: Insufficient documentation

## 2015-08-28 DIAGNOSIS — Z6841 Body Mass Index (BMI) 40.0 and over, adult: Secondary | ICD-10-CM | POA: Diagnosis not present

## 2015-08-28 DIAGNOSIS — E669 Obesity, unspecified: Secondary | ICD-10-CM | POA: Diagnosis not present

## 2015-08-28 NOTE — Progress Notes (Signed)
Bariatric Class:  Appt start time: 1530 end time:  1630.  2 Week Post-Operative Nutrition Class  Patient was seen on 08/28/2015 for Post-Operative Nutrition education at [facility].   Surgery date: 08/13/15 Surgery type: Sleeve gastrectomy Start weight at [facility]: 237 lbs on 04/12/15 Weight today: 211.0 lbs  Weight change: 30.3 lbs  TANITA  BODY COMP RESULTS  07/16/15 08/28/15   BMI (kg/m^2) N/A 35.1   Fat Mass (lbs)  80.5   Fat Free Mass (lbs)  130.5   Total Body Water (lbs)  95.5    The following the learning objectives were met by the patient during this course:  Identifies Phase 3A (Soft, High Proteins) Dietary Goals and will begin from 2 weeks post-operatively to 2 months post-operatively  Identifies appropriate sources of fluids and proteins   States protein recommendations and appropriate sources post-operatively  Identifies the need for appropriate texture modifications, mastication, and bite sizes when consuming solids  Identifies appropriate multivitamin and calcium sources post-operatively  Describes the need for physical activity post-operatively and will follow MD recommendations  States when to call healthcare provider regarding medication questions or post-operative complications  Handouts given during class include:  Phase 3A: Soft, High Protein Diet Handout  Follow-Up Plan: Patient will follow-up at [facility] in 6 weeks for 2 month post-op nutrition visit for diet advancement per MD.

## 2015-08-31 ENCOUNTER — Other Ambulatory Visit: Payer: Self-pay | Admitting: Physician Assistant

## 2015-09-06 ENCOUNTER — Ambulatory Visit (INDEPENDENT_AMBULATORY_CARE_PROVIDER_SITE_OTHER): Payer: 59 | Admitting: Psychiatry

## 2015-09-06 ENCOUNTER — Encounter (HOSPITAL_COMMUNITY): Payer: Self-pay | Admitting: Psychiatry

## 2015-09-06 VITALS — BP 126/76 | HR 77 | Ht 65.5 in | Wt 204.0 lb

## 2015-09-06 DIAGNOSIS — F411 Generalized anxiety disorder: Secondary | ICD-10-CM | POA: Diagnosis not present

## 2015-09-06 DIAGNOSIS — F331 Major depressive disorder, recurrent, moderate: Secondary | ICD-10-CM | POA: Diagnosis not present

## 2015-09-06 DIAGNOSIS — F410 Panic disorder [episodic paroxysmal anxiety] without agoraphobia: Secondary | ICD-10-CM

## 2015-09-06 DIAGNOSIS — F39 Unspecified mood [affective] disorder: Secondary | ICD-10-CM | POA: Diagnosis not present

## 2015-09-06 MED ORDER — CITALOPRAM HYDROBROMIDE 40 MG PO TABS: 40.0000 mg | ORAL_TABLET | Freq: Every day | ORAL | Status: DC

## 2015-09-06 MED ORDER — LAMOTRIGINE 200 MG PO TABS
200.0000 mg | ORAL_TABLET | Freq: Every day | ORAL | Status: DC
Start: 2015-09-06 — End: 2016-02-08

## 2015-09-06 NOTE — Progress Notes (Signed)
Patient ID: Shannon [name], female   DOB: Feb 12, 1966, 49 y.o.   MRN: [id number]  [facility] Follow up visit  CIMBERLY STOFFEL [id number] 49 y.o.  09/06/2015 9:05 AM  Chief Complaint:  Depression follow up  History of Present Illness:   Patient Presents for follow up and medication management for Major depression and Generalized anxiety disorder. In past she has  followed with Dr. Letta Moynahan psychiatrist office for many years. She has been on Lamictal and Zoloft. Recently her Zoloft was discontinued and she was started on that to [place]. Apparently she felt more agitation on the latuda so she called their office for a change. She was not getting a quick responses so she wanted to change provider.  Depression; she had some concern with the first refill said that the Celexa was a different kind of brand. But now she has cut the refill and this medication is working better. Bariatric surgery; done in October . She has already lost 39 lbs. She feels excited and has not shown signs of depression. Anxiety; some post op anxiety but not improved. . 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  No side effects on Lamictal or rash She has gone thru divorce, which has been a significant contributing factor to her depression. She has appointment today with therapist.  Severity of depression. 7 out of 10. 10 being no depression.  Her other factors include difficult marriage that started 20 years ago leading to divorced. She now take care of her son 12 years of age  Modifying factors; she likes to work on crafts goes to [employer] and also arts work.  She has been diagnosed with mood disorder there is no clear history of bipolar but she does get irritable or edgy for some time but she feels it is out of frustration and depression    Past Psychiatric History/Hospitalization(s) Manged for depression and mood symptoms for more then 20 years.  Prior Suicide Attempts:  No  Medical History; Past Medical History  Diagnosis Date  . Hyperlipidemia   . Anxiety   . Depression   . Diabetes mellitus without complication ([place])   . PONV (postoperative nausea and vomiting)     slow to wake up   . Sleep apnea     cpap -0 setting at 16   . Shortness of breath dyspnea     with exertion   . Hypothyroidism   . Stress incontinence   . History of hiatal hernia   . GERD (gastroesophageal reflux disease)   . Arthritis     back and hips     Allergies: Allergies  Allergen Reactions  . Belviq [Lorcaserin Hcl]     Increased appetitie  . Contrave [Naltrexone-Bupropion Hcl Er]     Sleepy.  . Food     Big Red Gum  . Latuda [Lurasidone Hcl]     Severe aggitation  . Lipitor [Atorvastatin] Other (See Comments)    Muscle aches    Medications: Outpatient Encounter Prescriptions as of 09/06/2015  Medication Sig  . ALPRAZolam (XANAX) 0.5 MG tablet Take 1 tablet (0.5 mg total) by mouth at bedtime as needed for anxiety.  . AMBULATORY NON FORMULARY MEDICATION AccuCheck One Touch test strips and lancets Dx: E11.9  . BIOTIN PO Take 1 tablet by mouth daily.  . citalopram (CELEXA) 40 MG tablet Take 1 tablet (40 mg total) by mouth daily. Take one tablet of 26m qd.  . lamoTRIgine (LAMICTAL) 200 MG tablet Take  1 tablet (200 mg total) by mouth daily.  Marland Kitchen levothyroxine (SYNTHROID, LEVOTHROID) 25 MCG tablet TAKE 1 TABLET (25 MCG TOTAL) BY MOUTH DAILY BEFORE BREAKFAST ** NEED APPOINTMENT FOR REFILLS**  . simvastatin (ZOCOR) 20 MG tablet Take 1 tablet (20 mg total) by mouth at bedtime.  . [DISCONTINUED] citalopram (CELEXA) 40 MG tablet Take 1 tablet (40 mg total) by mouth daily. Take one tablet of 19m qd.  . [DISCONTINUED] lamoTRIgine (LAMICTAL) 200 MG tablet Take 1 tablet (200 mg total) by mouth daily.  . methocarbamol (ROBAXIN) 500 MG tablet Take 1 tablet (500 mg total) by mouth every 6 (six) hours as needed for muscle spasms. (Patient not taking: Reported on 09/06/2015)  .  oxyCODONE (ROXICODONE) 5 MG/5ML solution Take 5-10 mLs (5-10 mg total) by mouth every 4 (four) hours as needed for moderate pain or severe pain. (Patient not taking: Reported on 09/06/2015)   No facility-administered encounter medications on file as of 09/06/2015.     Family History; Family History  Problem Relation Age of Onset  . Hyperlipidemia Mother   . Sleep apnea Mother   . Depression Mother   . Diabetes Father   . Hypertension Father   . COPD Father   . Diabetes Paternal Aunt   . Diabetes Paternal Uncle   . Alcohol abuse Paternal Uncle   . Alcohol abuse Paternal Grandfather   . Depression Sister       Labs:  Recent Results (from the past 2160 hour(s))  CBC WITH DIFFERENTIAL     Status: Abnormal   Collection Time: 07/31/15  8:30 AM  Result Value Ref Range   WBC 7.8 4.0 - 10.5 K/uL   RBC 4.39 3.87 - 5.11 MIL/uL   Hemoglobin 12.9 12.0 - 15.0 g/dL   HCT 40.2 36.0 - 46.0 %   MCV 91.6 78.0 - 100.0 fL   MCH 29.4 26.0 - 34.0 pg   MCHC 32.1 30.0 - 36.0 g/dL   RDW 13.3 11.5 - 15.5 %   Platelets 419 (H) 150 - 400 K/uL   Neutrophils Relative % 55 %   Neutro Abs 4.3 1.7 - 7.7 K/uL   Lymphocytes Relative 34 %   Lymphs Abs 2.6 0.7 - 4.0 K/uL   Monocytes Relative 5 %   Monocytes Absolute 0.4 0.1 - 1.0 K/uL   Eosinophils Relative 5 %   Eosinophils Absolute 0.4 0.0 - 0.7 K/uL   Basophils Relative 1 %   Basophils Absolute 0.1 0.0 - 0.1 K/uL  Comprehensive metabolic panel     Status: Abnormal   Collection Time: 07/31/15  8:30 AM  Result Value Ref Range   Sodium 138 135 - 145 mmol/L   Potassium 4.0 3.5 - 5.1 mmol/L   Chloride 102 101 - 111 mmol/L   CO2 29 22 - 32 mmol/L   Glucose, Bld 113 (H) 65 - 99 mg/dL   BUN 20 6 - 20 mg/dL   Creatinine, Ser 0.66 0.44 - 1.00 mg/dL   Calcium 9.4 8.9 - 10.3 mg/dL   Total Protein 7.2 6.5 - 8.1 g/dL   Albumin 4.0 3.5 - 5.0 g/dL   AST 38 15 - 41 U/L   ALT 43 14 - 54 U/L   Alkaline Phosphatase 78 38 - 126 U/L   Total Bilirubin 0.3 0.3  - 1.2 mg/dL   GFR calc non Af Amer >60 >60 mL/min   GFR calc Af Amer >60 >60 mL/min    Comment: (NOTE) The eGFR has been calculated using the CKD  EPI equation. This calculation has not been validated in all clinical situations. eGFR's persistently <60 mL/min signify possible Chronic Kidney Disease.    Anion gap 7 5 - 15  hCG, serum, qualitative     Status: None   Collection Time: 07/31/15  8:30 AM  Result Value Ref Range   Preg, Serum NEGATIVE NEGATIVE    Comment:        THE SENSITIVITY OF THIS METHODOLOGY IS >10 mIU/mL.   Glucose, capillary     Status: None   Collection Time: 08/13/15  8:53 AM  Result Value Ref Range   Glucose-Capillary 76 65 - 99 mg/dL  Glucose, capillary     Status: None   Collection Time: 08/13/15 11:44 AM  Result Value Ref Range   Glucose-Capillary 68 65 - 99 mg/dL  Glucose, capillary     Status: Abnormal   Collection Time: 08/13/15  1:11 PM  Result Value Ref Range   Glucose-Capillary 133 (H) 65 - 99 mg/dL  Glucose, capillary     Status: Abnormal   Collection Time: 08/13/15  3:35 PM  Result Value Ref Range   Glucose-Capillary 152 (H) 65 - 99 mg/dL  Hemoglobin and hematocrit, blood     Status: None   Collection Time: 08/13/15  4:00 PM  Result Value Ref Range   Hemoglobin 12.9 12.0 - 15.0 g/dL   HCT 39.3 36.0 - 46.0 %  Glucose, capillary     Status: Abnormal   Collection Time: 08/13/15  6:36 PM  Result Value Ref Range   Glucose-Capillary 170 (H) 65 - 99 mg/dL  Glucose, capillary     Status: Abnormal   Collection Time: 08/13/15  8:00 PM  Result Value Ref Range   Glucose-Capillary 144 (H) 65 - 99 mg/dL  Glucose, capillary     Status: Abnormal   Collection Time: 08/13/15 11:52 PM  Result Value Ref Range   Glucose-Capillary 122 (H) 65 - 99 mg/dL  Glucose, capillary     Status: None   Collection Time: 08/14/15  3:51 AM  Result Value Ref Range   Glucose-Capillary 99 65 - 99 mg/dL  CBC WITH DIFFERENTIAL     Status: Abnormal   Collection Time:  08/14/15  5:46 AM  Result Value Ref Range   WBC 11.8 (H) 4.0 - 10.5 K/uL   RBC 4.40 3.87 - 5.11 MIL/uL   Hemoglobin 12.8 12.0 - 15.0 g/dL   HCT 40.2 36.0 - 46.0 %   MCV 91.4 78.0 - 100.0 fL   MCH 29.1 26.0 - 34.0 pg   MCHC 31.8 30.0 - 36.0 g/dL   RDW 13.3 11.5 - 15.5 %   Platelets 379 150 - 400 K/uL   Neutrophils Relative % 77 %   Neutro Abs 9.1 (H) 1.7 - 7.7 K/uL   Lymphocytes Relative 18 %   Lymphs Abs 2.1 0.7 - 4.0 K/uL   Monocytes Relative 5 %   Monocytes Absolute 0.6 0.1 - 1.0 K/uL   Eosinophils Relative 0 %   Eosinophils Absolute 0.0 0.0 - 0.7 K/uL   Basophils Relative 0 %   Basophils Absolute 0.0 0.0 - 0.1 K/uL  Comprehensive metabolic panel     Status: Abnormal   Collection Time: 08/14/15  5:46 AM  Result Value Ref Range   Sodium 136 135 - 145 mmol/L   Potassium 4.3 3.5 - 5.1 mmol/L   Chloride 101 101 - 111 mmol/L   CO2 26 22 - 32 mmol/L   Glucose, Bld 102 (H) 65 - 99  mg/dL   BUN 8 6 - 20 mg/dL   Creatinine, Ser 0.70 0.44 - 1.00 mg/dL   Calcium 8.3 (L) 8.9 - 10.3 mg/dL   Total Protein 7.2 6.5 - 8.1 g/dL   Albumin 4.0 3.5 - 5.0 g/dL   AST 69 (H) 15 - 41 U/L   ALT 78 (H) 14 - 54 U/L   Alkaline Phosphatase 67 38 - 126 U/L   Total Bilirubin 0.7 0.3 - 1.2 mg/dL   GFR calc non Af Amer >60 >60 mL/min   GFR calc Af Amer >60 >60 mL/min    Comment: (NOTE) The eGFR has been calculated using the CKD EPI equation. This calculation has not been validated in all clinical situations. eGFR's persistently <60 mL/min signify possible Chronic Kidney Disease.    Anion gap 9 5 - 15  Glucose, capillary     Status: None   Collection Time: 08/14/15  7:45 AM  Result Value Ref Range   Glucose-Capillary 82 65 - 99 mg/dL  Glucose, capillary     Status: None   Collection Time: 08/14/15 11:41 AM  Result Value Ref Range   Glucose-Capillary 97 65 - 99 mg/dL  Hemoglobin and hematocrit, blood     Status: None   Collection Time: 08/14/15  4:03 PM  Result Value Ref Range   Hemoglobin  13.0 12.0 - 15.0 g/dL   HCT 41.0 36.0 - 46.0 %  Glucose, capillary     Status: None   Collection Time: 08/14/15  4:29 PM  Result Value Ref Range   Glucose-Capillary 99 65 - 99 mg/dL  Glucose, capillary     Status: None   Collection Time: 08/14/15  8:09 PM  Result Value Ref Range   Glucose-Capillary 79 65 - 99 mg/dL  Glucose, capillary     Status: None   Collection Time: 08/14/15 11:59 PM  Result Value Ref Range   Glucose-Capillary 71 65 - 99 mg/dL  Glucose, capillary     Status: None   Collection Time: 08/15/15  3:56 AM  Result Value Ref Range   Glucose-Capillary 78 65 - 99 mg/dL  CBC with Differential     Status: None   Collection Time: 08/15/15  5:03 AM  Result Value Ref Range   WBC 8.6 4.0 - 10.5 K/uL   RBC 4.15 3.87 - 5.11 MIL/uL   Hemoglobin 12.2 12.0 - 15.0 g/dL   HCT 38.4 36.0 - 46.0 %   MCV 92.5 78.0 - 100.0 fL   MCH 29.4 26.0 - 34.0 pg   MCHC 31.8 30.0 - 36.0 g/dL   RDW 13.7 11.5 - 15.5 %   Platelets 346 150 - 400 K/uL   Neutrophils Relative % 65 %   Neutro Abs 5.6 1.7 - 7.7 K/uL   Lymphocytes Relative 27 %   Lymphs Abs 2.3 0.7 - 4.0 K/uL   Monocytes Relative 6 %   Monocytes Absolute 0.5 0.1 - 1.0 K/uL   Eosinophils Relative 2 %   Eosinophils Absolute 0.1 0.0 - 0.7 K/uL   Basophils Relative 0 %   Basophils Absolute 0.0 0.0 - 0.1 K/uL  Glucose, capillary     Status: None   Collection Time: 08/15/15  7:43 AM  Result Value Ref Range   Glucose-Capillary 74 65 - 99 mg/dL  Glucose, capillary     Status: None   Collection Time: 08/15/15 12:01 PM  Result Value Ref Range   Glucose-Capillary 89 65 - 99 mg/dL       Musculoskeletal: Strength &  Muscle Tone: within normal limits Gait & Station: normal Patient leans: N/A  Mental Status Examination;   Psychiatric Specialty Exam: Physical Exam  Constitutional: She appears well-developed and well-nourished. No distress.  Skin: She is not diaphoretic.    Review of Systems  Cardiovascular: Negative for chest  pain and palpitations.  Skin: Negative for itching and rash.  Neurological: Negative for tremors and headaches.  Psychiatric/Behavioral: Negative for depression, suicidal ideas and substance abuse.    Blood pressure 126/76, pulse 77, height 5' 5.5" (1.664 m), weight 204 lb (92.534 kg), SpO2 96 %.Body mass index is 33.42 kg/(m^2).  General Appearance: Casual  Eye Contact::  Fair  Speech:  Normal Rate  Volume:  Normal  Mood:  euthymic  Affect:  Congruent  Thought Process:  Coherent  Orientation:  Full (Time, Place, and Person)  Thought Content:  Rumination  Suicidal Thoughts:  No  Homicidal Thoughts:  No  Memory:  Immediate;   Fair Recent;   Fair  Judgement:  Fair  Insight:  Shallow  Psychomotor Activity:  Normal  Concentration:  Fair  Recall:  Fair  Akathisia:  Negative  Handed:  Right  AIMS (if indicated):     Assets:  Communication Skills Desire for Improvement Financial Resources/Insurance Housing  Sleep:        Assessment: Axis I: mood disorder unspecified. Rule out mood disorder secondary to general medical condition. Major depressive disorder recurrent moderate. Rule out panic disorder. Generalized anxiety disorder  Axis II: deferred  Axis III:  Past Medical History  Diagnosis Date  . Hyperlipidemia   . Anxiety   . Depression   . Diabetes mellitus without complication ([place])   . PONV (postoperative nausea and vomiting)     slow to wake up   . Sleep apnea     cpap -0 setting at 16   . Shortness of breath dyspnea     with exertion   . Hypothyroidism   . Stress incontinence   . History of hiatal hernia   . GERD (gastroesophageal reflux disease)   . Arthritis     back and hips     Axis IV: psychosocial   Treatment Plan and Summary: continue Celexa to 40 mg. For depression, anxiety continue lamictal 225m qd. Refills given.  Xanax 0.5 mg qd prn for anxiety and panic attacks. No refill given she takes prn. Bariatric surgery: recovering and loosing  weight.  Continue therapy  She is a non-smoker Pertinent Labs and Relevant Prior Notes reviewed. Medication Side effects, benefits and risks reviewed/discussed with Patient. Time given for patient to respond and asks questions regarding the Diagnosis and Medications. Safety concerns and to report to ER if suicidal or call 911. Relevant Medications refilled or called in to pharmacy. Discussed weight maintenance and Sleep Hygiene. Follow up with Primary care provider in regards to Medical conditions. Recommend compliance with medications and follow up office appointments. Discussed to avail opportunity to consider or/and continue Individual therapy with Counselor. Greater than 50% of time was spend in counseling and coordination of care with the patient.  Schedule for Follow up visit in 8  weeks or call in earlier as necessary. She can come early after bariatric surgery expected October 23rd.    AMerian Capron MD 09/06/2015

## 2015-10-09 ENCOUNTER — Encounter: Payer: Self-pay | Admitting: Dietician

## 2015-10-09 ENCOUNTER — Encounter: Payer: 59 | Attending: General Surgery | Admitting: Dietician

## 2015-10-09 VITALS — Ht 65.0 in | Wt 184.0 lb

## 2015-10-09 DIAGNOSIS — Z6841 Body Mass Index (BMI) 40.0 and over, adult: Secondary | ICD-10-CM | POA: Insufficient documentation

## 2015-10-09 DIAGNOSIS — E669 Obesity, unspecified: Secondary | ICD-10-CM | POA: Diagnosis present

## 2015-10-09 DIAGNOSIS — Z713 Dietary counseling and surveillance: Secondary | ICD-10-CM | POA: Diagnosis not present

## 2015-10-09 DIAGNOSIS — Z6838 Body mass index (BMI) 38.0-38.9, adult: Secondary | ICD-10-CM

## 2015-10-09 NOTE — Patient Instructions (Addendum)
Goals:  Follow Phase 3B: High Protein + Non-Starchy Vegetables  Eat 3-6 small meals/snacks, every 3-5 hrs  Increase lean protein foods to meet 60g goal  Increase fluid intake to 64oz +  Avoid drinking 15 minutes before, during and 30 minutes after eating  Aim for >30 min of physical activity daily  Try Citracal Petites and NatureMade multivitamins  Surgery date: 08/13/15 Surgery type: Sleeve gastrectomy Start weight at [facility]: 237 lbs on 04/12/15 Weight today: 184 lbs Weight change: 27 lbs Total weight lost: 53 lbs  TANITA  BODY COMP RESULTS  07/16/15 08/28/15 10/09/15   BMI (kg/m^2) N/A 35.1 30.6   Fat Mass (lbs)  80.5 71   Fat Free Mass (lbs)  130.5 113   Total Body Water (lbs)  95.5 82.5

## 2015-10-09 NOTE — Progress Notes (Signed)
  Follow-up visit:  8 Weeks Post-Operative Sleeve gastrectomy Surgery  Medical Nutrition Therapy:  Appt start time: 0900 end time:  0920.  Primary concerns today: Post-operative Bariatric Surgery Nutrition Management. Feeling the best she has in years. Drinking 2-3 shakes per day.   Surgery date: 08/13/15 Surgery type: Sleeve gastrectomy Start weight at [facility]: 237 lbs on 04/12/15 Weight today: 184 lbs Weight change: 27 lbs Total weight lost: 53 lbs  TANITA  BODY COMP RESULTS  07/16/15 08/28/15 10/09/15   BMI (kg/m^2) N/A 35.1 30.6   Fat Mass (lbs)  80.5 71   Fat Free Mass (lbs)  130.5 113   Total Body Water (lbs)  95.5 82.5     Preferred Learning Style:  No preference indicated   Learning Readiness:   Ready  24-hr recall: B (AM): [place] sausage and egg (10g) Snk (AM): Premier protein shake (30g)  L (PM): 1-2 oz chicken or fish (7-14g) Snk (PM): [place] yogurt with PB2 (15g)  D (PM): lean hamburger patty with A1 steak sauce (14g) Snk (PM): Premier protein shake (30g)  Fluid intake: struggling to get fluids at work; 64 oz when she is at home (sugar free twist, water, crystal light, and water) Estimated total protein intake: 90-110g/day  Medications: no longer on Metformin Supplementation: sometimes forgets Calcium  Using straws: no Drinking while eating: no Hair loss: none, taking Biotin Carbonated beverages: none N/V/D/C: nausea; occasional constipation, taking Miralax Dumping syndrome: none  Recent physical activity:  Walking   Progress Towards Goal(s):  In progress.  Handouts given during visit include:  Phase 3B lean protein + non starchy vegetables   Nutritional Diagnosis:  Trainer-3.3 Overweight/obesity related to past poor dietary habits and physical inactivity as evidenced by patient w/ recent sleeve gastrectomy surgery following dietary guidelines for continued weight loss.     Intervention:  Nutrition counseling provided.  Teaching Method Utilized:   Visual Auditory Hands on  Barriers to learning/adherence to lifestyle change: none  Demonstrated degree of understanding via:  Teach Back   Monitoring/Evaluation:  Dietary intake, exercise, and body weight. Follow up in 2 months for 4 month post-op visit.

## 2015-11-06 ENCOUNTER — Ambulatory Visit (HOSPITAL_COMMUNITY): Payer: 59 | Admitting: Psychiatry

## 2015-11-08 ENCOUNTER — Ambulatory Visit: Payer: Self-pay | Admitting: Physician Assistant

## 2015-11-09 ENCOUNTER — Ambulatory Visit (INDEPENDENT_AMBULATORY_CARE_PROVIDER_SITE_OTHER): Payer: 59 | Admitting: Psychiatry

## 2015-11-09 ENCOUNTER — Encounter (HOSPITAL_COMMUNITY): Payer: Self-pay | Admitting: Psychiatry

## 2015-11-09 ENCOUNTER — Ambulatory Visit (INDEPENDENT_AMBULATORY_CARE_PROVIDER_SITE_OTHER): Payer: 59 | Admitting: Physician Assistant

## 2015-11-09 VITALS — BP 97/50 | HR 66 | Ht 65.5 in | Wt 170.0 lb

## 2015-11-09 VITALS — BP 110/64 | HR 75 | Ht 65.5 in | Wt 171.0 lb

## 2015-11-09 DIAGNOSIS — F331 Major depressive disorder, recurrent, moderate: Secondary | ICD-10-CM

## 2015-11-09 DIAGNOSIS — F410 Panic disorder [episodic paroxysmal anxiety] without agoraphobia: Secondary | ICD-10-CM

## 2015-11-09 DIAGNOSIS — F0630 Mood disorder due to known physiological condition, unspecified: Secondary | ICD-10-CM

## 2015-11-09 DIAGNOSIS — Z9884 Bariatric surgery status: Secondary | ICD-10-CM

## 2015-11-09 DIAGNOSIS — G4733 Obstructive sleep apnea (adult) (pediatric): Secondary | ICD-10-CM | POA: Diagnosis not present

## 2015-11-09 DIAGNOSIS — E039 Hypothyroidism, unspecified: Secondary | ICD-10-CM | POA: Diagnosis not present

## 2015-11-09 DIAGNOSIS — F341 Dysthymic disorder: Secondary | ICD-10-CM

## 2015-11-09 DIAGNOSIS — E119 Type 2 diabetes mellitus without complications: Secondary | ICD-10-CM | POA: Diagnosis not present

## 2015-11-09 DIAGNOSIS — R634 Abnormal weight loss: Secondary | ICD-10-CM

## 2015-11-09 DIAGNOSIS — F411 Generalized anxiety disorder: Secondary | ICD-10-CM

## 2015-11-09 DIAGNOSIS — Z9989 Dependence on other enabling machines and devices: Secondary | ICD-10-CM

## 2015-11-09 LAB — POCT GLYCOSYLATED HEMOGLOBIN (HGB A1C): HEMOGLOBIN A1C: 5.4

## 2015-11-09 MED ORDER — ALPRAZOLAM 0.5 MG PO TABS: 0.5000 mg | ORAL_TABLET | Freq: Every evening | ORAL | Status: DC | PRN

## 2015-11-09 NOTE — Progress Notes (Signed)
Patient ID: Shannon [name], female   DOB: Jan 07, 1966, 50 y.o.   MRN: [id number]  [facility] Follow up visit  Shannon [name] [id number] 50 y.o.  11/09/2015 8:48 AM  Chief Complaint:  Depression follow up  History of Present Illness:   Patient Presents for follow up and medication management for Major depression and Generalized anxiety disorder. In past she has  followed with Dr. Letta Moynahan psychiatrist office for many years. She has been on Lamictal and Zoloft. Recently her Zoloft was discontinued and she was started on that to [place]. Apparently she felt more agitation on the latuda so she called their office for a change. She was not getting a quick responses so she wanted to change provider.  Depression; .says didn't get the brand name celexa so it doesn't work that well. Some down days. Bariatric surgery; done in October . She continues to loose weight and feels less tired. That makes her exceited. Anxiety; infrequent , worries about her son.  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  No side effects on Lamictal or rash She has gone thru divorce, which has been a significant contributing factor to her depression. Now has a boyfriend.  Severity of depression. 7 out of 10. 10 being no depression.  Her other factors include difficult marriage that started 20 years ago leading to divorced. She now take care of her son 29 years of age  Modifying factors; she likes to work on crafts goes to [employer] and also arts work.  She has been diagnosed with mood disorder there is no clear history of bipolar but she does get irritable or edgy for some time but she feels it is out of frustration and depression    Past Psychiatric History/Hospitalization(s) Manged for depression and mood symptoms for more then 20 years.  Prior Suicide Attempts: No  Medical History; Past Medical History  Diagnosis Date  . Hyperlipidemia   . Anxiety   . Depression   .  Diabetes mellitus without complication ([place])   . PONV (postoperative nausea and vomiting)     slow to wake up   . Sleep apnea     cpap -0 setting at 16   . Shortness of breath dyspnea     with exertion   . Hypothyroidism   . Stress incontinence   . History of hiatal hernia   . GERD (gastroesophageal reflux disease)   . Arthritis     back and hips     Allergies: Allergies  Allergen Reactions  . Belviq [Lorcaserin Hcl]     Increased appetitie  . Contrave [Naltrexone-Bupropion Hcl Er]     Sleepy.  . Food     Big Red Gum  . Latuda [Lurasidone Hcl]     Severe aggitation  . Lipitor [Atorvastatin] Other (See Comments)    Muscle aches    Medications: Outpatient Encounter Prescriptions as of 11/09/2015  Medication Sig  . ALPRAZolam (XANAX) 0.5 MG tablet Take 1 tablet (0.5 mg total) by mouth at bedtime as needed for anxiety.  . AMBULATORY NON FORMULARY MEDICATION AccuCheck One Touch test strips and lancets Dx: E11.9  . BIOTIN PO Take 1 tablet by mouth daily.  . citalopram (CELEXA) 40 MG tablet Take 1 tablet (40 mg total) by mouth daily. Take one tablet of 77m qd. (Patient taking differently: Take 40 mg by mouth daily. Take one tablet of 452mqd. Brand name medically neccessary.)  . lamoTRIgine (LAMICTAL) 200 MG tablet Take 1 tablet (  200 mg total) by mouth daily.  Marland Kitchen levothyroxine (SYNTHROID, LEVOTHROID) 25 MCG tablet TAKE 1 TABLET (25 MCG TOTAL) BY MOUTH DAILY BEFORE BREAKFAST ** NEED APPOINTMENT FOR REFILLS**  . [DISCONTINUED] ALPRAZolam (XANAX) 0.5 MG tablet Take 1 tablet (0.5 mg total) by mouth at bedtime as needed for anxiety.  . [DISCONTINUED] methocarbamol (ROBAXIN) 500 MG tablet Take 1 tablet (500 mg total) by mouth every 6 (six) hours as needed for muscle spasms. (Patient not taking: Reported on 09/06/2015)  . [DISCONTINUED] oxyCODONE (ROXICODONE) 5 MG/5ML solution Take 5-10 mLs (5-10 mg total) by mouth every 4 (four) hours as needed for moderate pain or severe pain. (Patient  not taking: Reported on 09/06/2015)  . [DISCONTINUED] simvastatin (ZOCOR) 20 MG tablet Take 1 tablet (20 mg total) by mouth at bedtime. (Patient not taking: Reported on 10/09/2015)   No facility-administered encounter medications on file as of 11/09/2015.     Family History; Family History  Problem Relation Age of Onset  . Hyperlipidemia Mother   . Sleep apnea Mother   . Depression Mother   . Diabetes Father   . Hypertension Father   . COPD Father   . Diabetes Paternal Aunt   . Diabetes Paternal Uncle   . Alcohol abuse Paternal Uncle   . Alcohol abuse Paternal Grandfather   . Depression Sister       Labs:  Recent Results (from the past 2160 hour(s))  Glucose, capillary     Status: None   Collection Time: 08/13/15  8:53 AM  Result Value Ref Range   Glucose-Capillary 76 65 - 99 mg/dL  Glucose, capillary     Status: None   Collection Time: 08/13/15 11:44 AM  Result Value Ref Range   Glucose-Capillary 68 65 - 99 mg/dL  Glucose, capillary     Status: Abnormal   Collection Time: 08/13/15  1:11 PM  Result Value Ref Range   Glucose-Capillary 133 (H) 65 - 99 mg/dL  Glucose, capillary     Status: Abnormal   Collection Time: 08/13/15  3:35 PM  Result Value Ref Range   Glucose-Capillary 152 (H) 65 - 99 mg/dL  Hemoglobin and hematocrit, blood     Status: None   Collection Time: 08/13/15  4:00 PM  Result Value Ref Range   Hemoglobin 12.9 12.0 - 15.0 g/dL   HCT 39.3 36.0 - 46.0 %  Glucose, capillary     Status: Abnormal   Collection Time: 08/13/15  6:36 PM  Result Value Ref Range   Glucose-Capillary 170 (H) 65 - 99 mg/dL  Glucose, capillary     Status: Abnormal   Collection Time: 08/13/15  8:00 PM  Result Value Ref Range   Glucose-Capillary 144 (H) 65 - 99 mg/dL  Glucose, capillary     Status: Abnormal   Collection Time: 08/13/15 11:52 PM  Result Value Ref Range   Glucose-Capillary 122 (H) 65 - 99 mg/dL  Glucose, capillary     Status: None   Collection Time: 08/14/15   3:51 AM  Result Value Ref Range   Glucose-Capillary 99 65 - 99 mg/dL  CBC WITH DIFFERENTIAL     Status: Abnormal   Collection Time: 08/14/15  5:46 AM  Result Value Ref Range   WBC 11.8 (H) 4.0 - 10.5 K/uL   RBC 4.40 3.87 - 5.11 MIL/uL   Hemoglobin 12.8 12.0 - 15.0 g/dL   HCT 40.2 36.0 - 46.0 %   MCV 91.4 78.0 - 100.0 fL   MCH 29.1 26.0 - 34.0 pg  MCHC 31.8 30.0 - 36.0 g/dL   RDW 13.3 11.5 - 15.5 %   Platelets 379 150 - 400 K/uL   Neutrophils Relative % 77 %   Neutro Abs 9.1 (H) 1.7 - 7.7 K/uL   Lymphocytes Relative 18 %   Lymphs Abs 2.1 0.7 - 4.0 K/uL   Monocytes Relative 5 %   Monocytes Absolute 0.6 0.1 - 1.0 K/uL   Eosinophils Relative 0 %   Eosinophils Absolute 0.0 0.0 - 0.7 K/uL   Basophils Relative 0 %   Basophils Absolute 0.0 0.0 - 0.1 K/uL  Comprehensive metabolic panel     Status: Abnormal   Collection Time: 08/14/15  5:46 AM  Result Value Ref Range   Sodium 136 135 - 145 mmol/L   Potassium 4.3 3.5 - 5.1 mmol/L   Chloride 101 101 - 111 mmol/L   CO2 26 22 - 32 mmol/L   Glucose, Bld 102 (H) 65 - 99 mg/dL   BUN 8 6 - 20 mg/dL   Creatinine, Ser 0.70 0.44 - 1.00 mg/dL   Calcium 8.3 (L) 8.9 - 10.3 mg/dL   Total Protein 7.2 6.5 - 8.1 g/dL   Albumin 4.0 3.5 - 5.0 g/dL   AST 69 (H) 15 - 41 U/L   ALT 78 (H) 14 - 54 U/L   Alkaline Phosphatase 67 38 - 126 U/L   Total Bilirubin 0.7 0.3 - 1.2 mg/dL   GFR calc non Af Amer >60 >60 mL/min   GFR calc Af Amer >60 >60 mL/min    Comment: (NOTE) The eGFR has been calculated using the CKD EPI equation. This calculation has not been validated in all clinical situations. eGFR's persistently <60 mL/min signify possible Chronic Kidney Disease.    Anion gap 9 5 - 15  Glucose, capillary     Status: None   Collection Time: 08/14/15  7:45 AM  Result Value Ref Range   Glucose-Capillary 82 65 - 99 mg/dL  Glucose, capillary     Status: None   Collection Time: 08/14/15 11:41 AM  Result Value Ref Range   Glucose-Capillary 97 65 - 99  mg/dL  Hemoglobin and hematocrit, blood     Status: None   Collection Time: 08/14/15  4:03 PM  Result Value Ref Range   Hemoglobin 13.0 12.0 - 15.0 g/dL   HCT 41.0 36.0 - 46.0 %  Glucose, capillary     Status: None   Collection Time: 08/14/15  4:29 PM  Result Value Ref Range   Glucose-Capillary 99 65 - 99 mg/dL  Glucose, capillary     Status: None   Collection Time: 08/14/15  8:09 PM  Result Value Ref Range   Glucose-Capillary 79 65 - 99 mg/dL  Glucose, capillary     Status: None   Collection Time: 08/14/15 11:59 PM  Result Value Ref Range   Glucose-Capillary 71 65 - 99 mg/dL  Glucose, capillary     Status: None   Collection Time: 08/15/15  3:56 AM  Result Value Ref Range   Glucose-Capillary 78 65 - 99 mg/dL  CBC with Differential     Status: None   Collection Time: 08/15/15  5:03 AM  Result Value Ref Range   WBC 8.6 4.0 - 10.5 K/uL   RBC 4.15 3.87 - 5.11 MIL/uL   Hemoglobin 12.2 12.0 - 15.0 g/dL   HCT 38.4 36.0 - 46.0 %   MCV 92.5 78.0 - 100.0 fL   MCH 29.4 26.0 - 34.0 pg   MCHC 31.8 30.0 -  36.0 g/dL   RDW 13.7 11.5 - 15.5 %   Platelets 346 150 - 400 K/uL   Neutrophils Relative % 65 %   Neutro Abs 5.6 1.7 - 7.7 K/uL   Lymphocytes Relative 27 %   Lymphs Abs 2.3 0.7 - 4.0 K/uL   Monocytes Relative 6 %   Monocytes Absolute 0.5 0.1 - 1.0 K/uL   Eosinophils Relative 2 %   Eosinophils Absolute 0.1 0.0 - 0.7 K/uL   Basophils Relative 0 %   Basophils Absolute 0.0 0.0 - 0.1 K/uL  Glucose, capillary     Status: None   Collection Time: 08/15/15  7:43 AM  Result Value Ref Range   Glucose-Capillary 74 65 - 99 mg/dL  Glucose, capillary     Status: None   Collection Time: 08/15/15 12:01 PM  Result Value Ref Range   Glucose-Capillary 89 65 - 99 mg/dL       Musculoskeletal: Strength & Muscle Tone: within normal limits Gait & Station: normal Patient leans: N/A  Mental Status Examination;   Psychiatric Specialty Exam: Physical Exam  Constitutional: She appears  well-developed and well-nourished. No distress.  Skin: She is not diaphoretic.    Review of Systems  Cardiovascular: Negative for chest pain and palpitations.  Skin: Negative for itching and rash.  Neurological: Negative for tremors and headaches.  Psychiatric/Behavioral: Negative for depression, suicidal ideas, hallucinations and substance abuse.    Blood pressure 110/64, pulse 75, height 5' 5.5" (1.664 m), weight 171 lb (77.565 kg), SpO2 97 %.Body mass index is 28.01 kg/(m^2).  General Appearance: Casual  Eye Contact::  Fair  Speech:  Normal Rate  Volume:  Normal  Mood:  euthymic  Affect:  Congruent  Thought Process:  Coherent  Orientation:  Full (Time, Place, and Person)  Thought Content:  Rumination  Suicidal Thoughts:  No  Homicidal Thoughts:  No  Memory:  Immediate;   Fair Recent;   Fair  Judgement:  Fair  Insight:  Shallow  Psychomotor Activity:  Normal  Concentration:  Fair  Recall:  Fair  Akathisia:  Negative  Handed:  Right  AIMS (if indicated):     Assets:  Communication Skills Desire for Improvement Financial Resources/Insurance Housing  Sleep:        Assessment: Axis I: mood disorder unspecified. Rule out mood disorder secondary to general medical condition. Major depressive disorder recurrent moderate. Rule out panic disorder. Generalized anxiety disorder  Axis II: deferred  Axis III:  Past Medical History  Diagnosis Date  . Hyperlipidemia   . Anxiety   . Depression   . Diabetes mellitus without complication ([place])   . PONV (postoperative nausea and vomiting)     slow to wake up   . Sleep apnea     cpap -0 setting at 16   . Shortness of breath dyspnea     with exertion   . Hypothyroidism   . Stress incontinence   . History of hiatal hernia   . GERD (gastroesophageal reflux disease)   . Arthritis     back and hips     Axis IV: psychosocial   Treatment Plan and Summary: continue Celexa to 40 mg. For depression, anxiety. Will write brand  name. Dysthymia: celexa as above continue lamictal 276m qd for mood symptoms. Has refill. Xanax 0.5 mg qd prn for anxiety and panic attacks. Refills given.  Bariatric surgery: recovering and loosing weight.  Continue therapy  She may talk with her sleep specialist to cut down her CPAP Pressure because  she is losing weight. She is a non-smoker Pertinent Labs and Relevant Prior Notes reviewed. Medication Side effects, benefits and risks reviewed/discussed with Patient. Time given for patient to respond and asks questions regarding the Diagnosis and Medications. Safety concerns and to report to ER if suicidal or call 911. Relevant Medications refilled or called in to pharmacy.  Follow up with Primary care provider in regards to Medical conditions. Recommend compliance with medications and follow up office appointments. Discussed to avail opportunity to consider or/and continue Individual therapy with Counselor. Greater than 50% of time was spend in counseling and coordination of care with the patient.  Schedule for Follow up visit in 8  weeks or call in earlier as necessary.    Merian Capron, MD 11/09/2015

## 2015-11-12 ENCOUNTER — Encounter: Payer: Self-pay | Admitting: Physician Assistant

## 2015-11-12 DIAGNOSIS — Z9889 Other specified postprocedural states: Secondary | ICD-10-CM | POA: Insufficient documentation

## 2015-11-12 MED FILL — ALPRAZolam 0.5 MG TABS: 0.5 | 30 days supply | Qty: 30 | Fill #0

## 2015-11-12 MED FILL — lamoTRIgine 200 MG TABS: 200 | 30 days supply | Qty: 30 | Fill #0

## 2015-11-12 MED FILL — CITALOPRAM HBR 40 MG TABLET: 40 | 30 days supply | Qty: 30 | Fill #0

## 2015-11-12 NOTE — Progress Notes (Signed)
   Subjective:    Patient ID: Shannon [name], female    DOB: 02-Apr-1966, 50 y.o.   MRN: [id number]  HPI Pt is a 50 yo who presents to the clinic for follow up. Pt had gastric sleeve done in October and lost 70lbs. She is doing fabulous and feeling great. She was dx with sleep apnea before she lost the weight and still using the machine. She feels like she does not need anymore and wants to be retested. She just wants all her labs checked to see what they look like and if improved. She would like her vitamin levels checked as well. She was dx of DM. After weight loss would like to see numbers.    Review of Systems  All other systems reviewed and are negative.      Objective:   Physical Exam  Constitutional: She is oriented to person, place, and time. She appears well-developed and well-nourished.  HENT:  Head: Normocephalic and atraumatic.  Neck: Normal range of motion. Neck supple. No thyromegaly present.  Cardiovascular: Normal rate, regular rhythm and normal heart sounds.   Pulmonary/Chest: Effort normal and breath sounds normal.  Neurological: She is alert and oriented to person, place, and time.  Psychiatric: She has a normal mood and affect. Her behavior is normal.          Assessment & Plan:  S/P gastric sleeve- will check b12, cbc, cmp, ferritin, and vitamin d. Continue with healthy diet and regular exercise.   OSA on CPAP- will order retest to see if needs to be on machine any more.   Hypothyroidism- will recheck levels and see if any dose adjustments need to be made.   DM- will recheck A!C. With recent weight loss likely resolved DM.

## 2015-11-22 DIAGNOSIS — E119 Type 2 diabetes mellitus without complications: Secondary | ICD-10-CM | POA: Diagnosis not present

## 2015-11-22 DIAGNOSIS — Z9889 Other specified postprocedural states: Secondary | ICD-10-CM | POA: Diagnosis not present

## 2015-11-22 LAB — CBC WITH DIFFERENTIAL/PLATELET
BASOS ABS: 0.1 10*3/uL (ref 0.0–0.1)
BASOS PCT: 1 % (ref 0–1)
EOS ABS: 0.3 10*3/uL (ref 0.0–0.7)
Eosinophils Relative: 4 % (ref 0–5)
HCT: 44.6 % (ref 36.0–46.0)
Hemoglobin: 14.5 g/dL (ref 12.0–15.0)
Lymphocytes Relative: 30 % (ref 12–46)
Lymphs Abs: 2.3 10*3/uL (ref 0.7–4.0)
MCH: 29.8 pg (ref 26.0–34.0)
MCHC: 32.5 g/dL (ref 30.0–36.0)
MCV: 91.6 fL (ref 78.0–100.0)
MPV: 9.7 fL (ref 8.6–12.4)
Monocytes Absolute: 0.4 10*3/uL (ref 0.1–1.0)
Monocytes Relative: 5 % (ref 3–12)
NEUTROS PCT: 60 % (ref 43–77)
Neutro Abs: 4.6 10*3/uL (ref 1.7–7.7)
PLATELETS: 377 10*3/uL (ref 150–400)
RBC: 4.87 MIL/uL (ref 3.87–5.11)
RDW: 15.9 % — ABNORMAL HIGH (ref 11.5–15.5)
WBC: 7.7 10*3/uL (ref 4.0–10.5)

## 2015-11-23 LAB — COMPLETE METABOLIC PANEL WITH GFR
ALBUMIN: 4.2 g/dL (ref 3.6–5.1)
ALK PHOS: 79 U/L (ref 33–115)
ALT: 28 U/L (ref 6–29)
AST: 23 U/L (ref 10–35)
BILIRUBIN TOTAL: 0.5 mg/dL (ref 0.2–1.2)
BUN: 15 mg/dL (ref 7–25)
CO2: 30 mmol/L (ref 20–31)
Calcium: 9.2 mg/dL (ref 8.6–10.2)
Chloride: 104 mmol/L (ref 98–110)
Creat: 0.49 mg/dL — ABNORMAL LOW (ref 0.50–1.10)
Glucose, Bld: 79 mg/dL (ref 65–99)
Potassium: 4.4 mmol/L (ref 3.5–5.3)
Sodium: 142 mmol/L (ref 135–146)
TOTAL PROTEIN: 6.4 g/dL (ref 6.1–8.1)

## 2015-11-23 LAB — TSH: TSH: 3.379 u[IU]/mL (ref 0.350–4.500)

## 2015-11-23 LAB — VITAMIN D 25 HYDROXY (VIT D DEFICIENCY, FRACTURES): VIT D 25 HYDROXY: 37 ng/mL (ref 30–100)

## 2015-11-23 LAB — LIPID PANEL
CHOL/HDL RATIO: 4 ratio (ref <=5.0)
Cholesterol: 167 mg/dL (ref 125–200)
HDL: 42 mg/dL — AB (ref >=46)
LDL Cholesterol: 106 mg/dL (ref <130)
Triglycerides: 96 mg/dL (ref <150)
VLDL: 19 mg/dL (ref <30)

## 2015-11-23 LAB — FERRITIN: FERRITIN: 19 ng/mL (ref 10–291)

## 2015-11-23 LAB — VITAMIN B12: Vitamin B-12: 776 pg/mL (ref 211–911)

## 2015-11-29 DIAGNOSIS — G4733 Obstructive sleep apnea (adult) (pediatric): Secondary | ICD-10-CM | POA: Diagnosis not present

## 2015-11-29 DIAGNOSIS — E039 Hypothyroidism, unspecified: Secondary | ICD-10-CM | POA: Diagnosis not present

## 2015-11-29 DIAGNOSIS — Z9884 Bariatric surgery status: Secondary | ICD-10-CM | POA: Diagnosis not present

## 2015-11-29 DIAGNOSIS — E782 Mixed hyperlipidemia: Secondary | ICD-10-CM | POA: Diagnosis not present

## 2015-11-29 DIAGNOSIS — M47816 Spondylosis without myelopathy or radiculopathy, lumbar region: Secondary | ICD-10-CM | POA: Diagnosis not present

## 2015-11-29 DIAGNOSIS — E663 Overweight: Secondary | ICD-10-CM | POA: Diagnosis not present

## 2015-11-29 DIAGNOSIS — Z9989 Dependence on other enabling machines and devices: Secondary | ICD-10-CM | POA: Diagnosis not present

## 2015-11-29 DIAGNOSIS — E119 Type 2 diabetes mellitus without complications: Secondary | ICD-10-CM | POA: Diagnosis not present

## 2015-11-29 DIAGNOSIS — E669 Obesity, unspecified: Secondary | ICD-10-CM | POA: Diagnosis not present

## 2015-12-17 ENCOUNTER — Encounter: Payer: Self-pay | Admitting: Dietician

## 2015-12-17 ENCOUNTER — Encounter: Payer: 59 | Attending: General Surgery | Admitting: Dietician

## 2015-12-17 DIAGNOSIS — E669 Obesity, unspecified: Secondary | ICD-10-CM | POA: Diagnosis not present

## 2015-12-17 DIAGNOSIS — Z6841 Body Mass Index (BMI) 40.0 and over, adult: Secondary | ICD-10-CM | POA: Insufficient documentation

## 2015-12-17 DIAGNOSIS — Z713 Dietary counseling and surveillance: Secondary | ICD-10-CM | POA: Insufficient documentation

## 2015-12-17 NOTE — Patient Instructions (Addendum)
Goals:  Follow Phase 3B: High Protein + Non-Starchy Vegetables  Eat 3-6 small meals/snacks, every 3-5 hrs  Increase lean protein foods to meet 60g goal  Increase fluid intake to 64oz +  Avoid drinking 15 minutes before, during and 30 minutes after eating  Aim for >30 min of physical activity daily  Try Citracal Petites and NatureMade multivitamins  Add some small amounts of "play foods" and don't feel guilty about it!  Eat protein first, then vegetables, then starch  Surgery date: 08/13/15 Surgery type: Sleeve gastrectomy Start weight at [facility]: 237 lbs on 04/12/15 Weight today: 162 lbs Weight change: 12 lbs Total weight lost: 75 lbs  TANITA  BODY COMP RESULTS  07/16/15 08/28/15 10/09/15 12/17/15   BMI (kg/m^2) N/A 35.1 30.6 27   Fat Mass (lbs)  80.5 71 56.5   Fat Free Mass (lbs)  130.5 113 105.5   Total Body Water (lbs)  95.5 82.5 77

## 2015-12-17 NOTE — Progress Notes (Signed)
  Follow-up visit:  4 months Post-Operative Sleeve gastrectomy Surgery  Medical Nutrition Therapy:  Appt start time: 805 end time:  830  Primary concerns today: Post-operative Bariatric Surgery Nutrition Management. Lisaanne returns stating she continues to feel well. She has tried some foods that are not recommended and feels very guilty. Weighs herself 2x a week and is at a plateau for the past few weeks. Would like to get to 155 lbs but is at 159 lbs on scale at home. Reports that she feels like she has a healthy outlook on her weight and health. Generally feels satisfied with meal pattern. Occasionally feels like she wants something extra. Craving fruit, no longer likes the taste of milk.   Surgery date: 08/13/15 Surgery type: Sleeve gastrectomy Start weight at [facility]: 237 lbs on 04/12/15 Weight today: 162 lbs Weight change: 12 lbs Total weight lost: 75 lbs  TANITA  BODY COMP RESULTS  07/16/15 08/28/15 10/09/15 12/17/15   BMI (kg/m^2) N/A 35.1 30.6 27   Fat Mass (lbs)  80.5 71 56.5   Fat Free Mass (lbs)  130.5 113 105.5   Total Body Water (lbs)  95.5 82.5 77     Preferred Learning Style:  No preference indicated   Learning Readiness:   Ready  24-hr recall: B (AM): boiled egg and 2 pieces bacon and 1/2 yogurt (14g) Snk (AM):  L (PM): 3 oz hamburger + salad (21g) Snk (PM): Premier protein shake (30g) D (PM): small [employer]'s chili (17g) Snk (PM): Dannon Light and Fit yogurt (12g)  Fluid intake: struggling to get fluids at work; 64 oz when she is at home (sugar free twist, water, crystal light, and water) Estimated total protein intake: 94g/day  Medications: no longer on Metformin Supplementation: sometimes forgets Calcium, added iron  Using straws: no Drinking while eating: no Hair loss: none, taking Biotin Carbonated beverages: none N/V/D/C: nausea; occasional constipation, taking Miralax Dumping syndrome: none  Recent physical activity:  Walking   Progress Towards Goal(s):   In progress.   Nutritional Diagnosis:  [place]-3.3 Overweight/obesity related to past poor dietary habits and physical inactivity as evidenced by patient w/ recent sleeve gastrectomy surgery following dietary guidelines for continued weight loss.     Intervention:  Nutrition counseling provided.  Teaching Method Utilized:  Visual Auditory Hands on  Barriers to learning/adherence to lifestyle change: none  Demonstrated degree of understanding via:  Teach Back   Monitoring/Evaluation:  Dietary intake, exercise, and body weight. Follow up in 4 months for 8 month post-op visit.

## 2016-01-01 MED FILL — CITALOPRAM HBR 40 MG TABLET: 40 | 30 days supply | Qty: 30 | Fill #1

## 2016-01-01 MED FILL — lamoTRIgine 200 MG TABS: 200 | 30 days supply | Qty: 30 | Fill #1

## 2016-01-10 ENCOUNTER — Ambulatory Visit (HOSPITAL_BASED_OUTPATIENT_CLINIC_OR_DEPARTMENT_OTHER): Payer: Self-pay

## 2016-01-31 ENCOUNTER — Ambulatory Visit (HOSPITAL_BASED_OUTPATIENT_CLINIC_OR_DEPARTMENT_OTHER): Payer: 59 | Attending: Physician Assistant | Admitting: Radiology

## 2016-01-31 VITALS — Ht 65.5 in | Wt 160.0 lb

## 2016-01-31 DIAGNOSIS — Z9884 Bariatric surgery status: Secondary | ICD-10-CM | POA: Diagnosis not present

## 2016-01-31 DIAGNOSIS — R5383 Other fatigue: Secondary | ICD-10-CM | POA: Diagnosis not present

## 2016-01-31 DIAGNOSIS — G4733 Obstructive sleep apnea (adult) (pediatric): Secondary | ICD-10-CM | POA: Insufficient documentation

## 2016-01-31 DIAGNOSIS — Z79899 Other long term (current) drug therapy: Secondary | ICD-10-CM | POA: Diagnosis not present

## 2016-01-31 DIAGNOSIS — R0683 Snoring: Secondary | ICD-10-CM | POA: Diagnosis not present

## 2016-01-31 DIAGNOSIS — I493 Ventricular premature depolarization: Secondary | ICD-10-CM | POA: Diagnosis not present

## 2016-01-31 DIAGNOSIS — R634 Abnormal weight loss: Secondary | ICD-10-CM | POA: Diagnosis not present

## 2016-02-04 ENCOUNTER — Emergency Department (INDEPENDENT_AMBULATORY_CARE_PROVIDER_SITE_OTHER)
Admission: EM | Admit: 2016-02-04 | Discharge: 2016-02-04 | Disposition: A | Discharge status: Home / Self Care | Payer: 59 | Source: Home / Self Care | Attending: Emergency Medicine | Admitting: Emergency Medicine

## 2016-02-04 ENCOUNTER — Encounter: Payer: Self-pay | Admitting: Emergency Medicine

## 2016-02-04 DIAGNOSIS — J0300 Acute streptococcal tonsillitis, unspecified: Secondary | ICD-10-CM

## 2016-02-04 DIAGNOSIS — J029 Acute pharyngitis, unspecified: Secondary | ICD-10-CM | POA: Diagnosis not present

## 2016-02-04 LAB — POCT RAPID STREP A (OFFICE): Rapid Strep A Screen: NEGATIVE

## 2016-02-04 MED ORDER — AMOXICILLIN 875 MG PO TABS: ORAL_TABLET | ORAL | Status: DC

## 2016-02-04 NOTE — ED Notes (Signed)
Sore throat, bilateral ear pain, fatigue, congestion x 5 days, strep exposure

## 2016-02-04 NOTE — ED Provider Notes (Signed)
CSN: [id number]     Arrival date & time 02/04/16  0855 History   First MD Initiated Contact with Patient 02/04/16 1043     Chief Complaint  Patient presents with  . Sore Throat   (Consider location/radiation/quality/duration/timing/severity/associated sxs/prior Treatment) HPI SORE THROAT Onset: 5 days    Severity: moderate-Severe Tried OTC meds without significant relief.  Symptoms:  + Fever  + Swollen neck glands + Recent Strep Exposure--son recently diagnosed with strep last week     Mild Myalgias Mild, nonfocal Headache No Rash  No Discolored Nasal Mucus No Allergy symptoms No sinus pain/pressure No itchy/red eyes Mild bilateral earache  No Drooling No Trismus  No Nausea No Vomiting No Abdominal pain No Diarrhea No Reflux symptoms  No Cough No Breathing Difficulty No Shortness of Breath No pleuritic pain No Wheezing No Hemoptysis   Past Medical History  Diagnosis Date  . Hyperlipidemia   . Anxiety   . Depression   . Diabetes mellitus without complication ([place])   . PONV (postoperative nausea and vomiting)     slow to wake up   . Sleep apnea     cpap -0 setting at 14   . Shortness of breath dyspnea     with exertion   . Hypothyroidism   . Stress incontinence   . History of hiatal hernia   . GERD (gastroesophageal reflux disease)   . Arthritis     back and hips    Past Surgical History  Procedure Laterality Date  . Appendectomy    . Feet surgery    . Wisdom tooth extraction    . Laproscopy    . Excision morton's neuroma Right 04/04/99    second interspace, right foot  . Excision morton's neuroma Left 04/04/99    second interspace, left foot  . Breath tek h pylori N/A 04/03/2015    Procedure: BREATH TEK H PYLORI;  Surgeon: Greer Pickerel, MD;  Location: [facility];  Service: General;  Laterality: N/A;  . Cesarean section    . Laparoscopic gastric sleeve resection with hiatal hernia repair N/A 08/13/2015    Procedure: LAPAROSCOPIC GASTRIC  SLEEVE RESECTION WITH HIATAL HERNIA REPAIR;  Surgeon: Greer Pickerel, MD;  Location: [facility];  Service: General;  Laterality: N/A;  . Upper gi endoscopy  08/13/2015    Procedure: UPPER GI ENDOSCOPY;  Surgeon: Greer Pickerel, MD;  Location: [facility];  Service: General;;   Family History  Problem Relation Age of Onset  . Hyperlipidemia Mother   . Sleep apnea Mother   . Depression Mother   . Diabetes Father   . Hypertension Father   . COPD Father   . Diabetes Paternal Aunt   . Diabetes Paternal Uncle   . Alcohol abuse Paternal Uncle   . Alcohol abuse Paternal Grandfather   . Depression Sister    Social History  Substance Use Topics  . Smoking status: Never Smoker   . Smokeless tobacco: Never Used  . Alcohol Use: No   OB History    No data available     Review of Systems  All other systems reviewed and are negative.   Allergies  Belviq; Contrave; Food; Latuda; and Lipitor  Home Medications   Prior to Admission medications   Medication Sig Start Date End Date Taking? Authorizing Provider  ALPRAZolam Duanne Moron) 0.5 MG tablet Take 1 tablet (0.5 mg total) by mouth at bedtime as needed for anxiety. 11/09/15   Merian Capron, MD  AMBULATORY NON FORMULARY MEDICATION AccuCheck One Touch test  strips and lancets Dx: E11.9 02/09/15   Jade L Breeback, PA-C  amoxicillin (AMOXIL) 875 MG tablet Take 1 twice a day X 10 days. 02/04/16   Jacqulyn Cane, MD  BIOTIN PO Take 1 tablet by mouth daily.    Historical Provider, MD  CALCIUM PO Take by mouth.    Historical Provider, MD  citalopram (CELEXA) 40 MG tablet Take 1 tablet (40 mg total) by mouth daily. Take one tablet of 40mg  qd. Patient taking differently: Take 40 mg by mouth daily. Take one tablet of 40mg  qd. Brand name medically neccessary. 09/06/15   Merian Capron, MD  Cyanocobalamin (B-12 PO) Take by mouth.    Historical Provider, MD  lamoTRIgine (LAMICTAL) 200 MG tablet Take 1 tablet (200 mg total) by mouth daily. 09/06/15   Merian Capron, MD   levothyroxine (SYNTHROID, LEVOTHROID) 25 MCG tablet TAKE 1 TABLET (25 MCG TOTAL) BY MOUTH DAILY BEFORE BREAKFAST ** NEED APPOINTMENT FOR REFILLS** 08/31/15   Jade L Breeback, PA-C  Multiple Vitamin (MULTIVITAMIN) tablet Take 1 tablet by mouth daily.    Historical Provider, MD  vitamin B-12 (CYANOCOBALAMIN) 1000 MCG tablet Take 1,000 mcg by mouth daily.    Historical Provider, MD   Meds Ordered and Administered this Visit  Medications - No data to display  BP 109/68 mmHg  Pulse 64  Temp(Src) 98 F (36.7 C) (Oral)  Ht 5\' 5"  (1.651 m)  Wt 167 lb (75.751 kg)  BMI 27.79 kg/m2  SpO2 100% No data found.   Physical Exam  Constitutional: She is oriented to person, place, and time. She appears well-developed and well-nourished.  Non-toxic appearance. She appears ill. No distress.  HENT:  Head: Normocephalic and atraumatic.  Right Ear: Tympanic membrane, external ear and ear canal normal.  Left Ear: Tympanic membrane, external ear and ear canal normal.  Nose: Nose normal. Right sinus exhibits no maxillary sinus tenderness and no frontal sinus tenderness. Left sinus exhibits no maxillary sinus tenderness and no frontal sinus tenderness.  Mouth/Throat: Uvula is midline and mucous membranes are normal. No oral lesions. Posterior oropharyngeal erythema present. No oropharyngeal exudate or tonsillar abscesses.  + Tonsillar enlargement.  Airway intact.  Eyes: Conjunctivae are normal. No scleral icterus.  Neck: Neck supple.  Cardiovascular: Normal rate, regular rhythm and normal heart sounds.   No murmur heard. Pulmonary/Chest: Effort normal and breath sounds normal. No stridor. No respiratory distress. She has no wheezes. She has no rhonchi. She has no rales.  Abdominal: Soft. She exhibits no mass. There is no hepatosplenomegaly. There is no tenderness.  Lymphadenopathy:    She has cervical adenopathy.       Right cervical: Superficial cervical adenopathy present. No deep cervical and no  posterior cervical adenopathy present.      Left cervical: Superficial cervical adenopathy present. No deep cervical and no posterior cervical adenopathy present.  Neurological: She is alert and oriented to person, place, and time.  Skin: Skin is warm. No rash noted.  Psychiatric: She has a normal mood and affect.  Nursing note and vitals reviewed.   ED Course  Procedures (including critical care time)  Labs Review Labs Reviewed  POCT RAPID STREP A (OFFICE)    Imaging Review No results found.   Visual Acuity Review  Right Eye Distance:   Left Eye Distance:   Bilateral Distance:    Right Eye Near:   Left Eye Near:    Bilateral Near:         MDM   1. Acute pharyngitis,  unspecified etiology   2. Acute streptococcal tonsillitis    Rapid strep test negative. Given physical findings and history and exposure to child with documented strep last week, she likely has acute strep tonsillitis, despite the negative rapid strep test. Treatment options discussed, as well as risks, benefits, alternatives. Patient voiced understanding and agreement with the following plans: Sendoff strep culture Discharge Medication List as of 02/04/2016 11:28 AM    START taking these medications   Details  amoxicillin (AMOXIL) 875 MG tablet Take 1 twice a day X 10 days., Normal       Other symptomatic care discussed Follow-up with your primary care doctor in 5-7 days if not improving, or sooner if symptoms become worse. Precautions discussed. Red flags discussed. Questions invited and answered. Patient voiced understanding and agreement.     Jacqulyn Cane, MD 02/04/16 1343

## 2016-02-08 ENCOUNTER — Encounter (HOSPITAL_COMMUNITY): Payer: Self-pay | Admitting: Psychiatry

## 2016-02-08 ENCOUNTER — Ambulatory Visit (INDEPENDENT_AMBULATORY_CARE_PROVIDER_SITE_OTHER): Payer: 59 | Admitting: Psychiatry

## 2016-02-08 VITALS — BP 122/64 | HR 78 | Ht 65.5 in | Wt 164.0 lb

## 2016-02-08 DIAGNOSIS — F410 Panic disorder [episodic paroxysmal anxiety] without agoraphobia: Secondary | ICD-10-CM

## 2016-02-08 DIAGNOSIS — F411 Generalized anxiety disorder: Secondary | ICD-10-CM | POA: Diagnosis not present

## 2016-02-08 DIAGNOSIS — F0630 Mood disorder due to known physiological condition, unspecified: Secondary | ICD-10-CM

## 2016-02-08 DIAGNOSIS — F341 Dysthymic disorder: Secondary | ICD-10-CM

## 2016-02-08 DIAGNOSIS — F331 Major depressive disorder, recurrent, moderate: Secondary | ICD-10-CM

## 2016-02-08 MED ORDER — LAMOTRIGINE 200 MG PO TABS: 200.0000 mg | ORAL_TABLET | Freq: Every day | ORAL | Status: DC

## 2016-02-08 MED ORDER — CITALOPRAM HYDROBROMIDE 40 MG PO TABS: 40.0000 mg | ORAL_TABLET | Freq: Every day | ORAL | Status: DC

## 2016-02-08 MED ORDER — ALPRAZOLAM 0.5 MG PO TABS: 0.5000 mg | ORAL_TABLET | Freq: Every evening | ORAL | Status: DC | PRN

## 2016-02-08 NOTE — Progress Notes (Signed)
Patient ID: Shannon [name], female   DOB: [birth year], 50 y.o.   MRN: [id number]  [place] Outpatient Follow up visit  Shannon [name] [id number] 50 y.o.  02/08/2016 8:58 AM  Chief Complaint:  Depression follow up  History of Present Illness:   Patient Presents for follow up and medication management for Major depression and Generalized anxiety disorder.  Depression; .celexa brand name works better. Mood is balanced Bariatric surgery; done in October . She continues to loose weight and feels less tired. Has adjusted her Cpap pressure as well. Anxiety; infrequent , worries about her son.  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  No side effects on Lamictal or rash She has gone thru divorce, which has been a significant contributing factor to her depression. Now has a boyfriend.  Severity of depression. 7 out of 10. 10 being no depression.  Her other factors include difficult marriage that started 20 years ago leading to divorced. She now take care of her son 67 years of age  Modifying factors; she likes to work on crafts goes to [employer] and also arts work.  She has been diagnosed with mood disorder there is no clear history of bipolar but she does get irritable or edgy for some time but she feels it is out of frustration and depression    Past Psychiatric History/Hospitalization(s) Manged for depression and mood symptoms for more then 20 years.  Prior Suicide Attempts: No  Medical History; Past Medical History  Diagnosis Date  . Hyperlipidemia   . Anxiety   . Depression   . Diabetes mellitus without complication ([place])   . PONV (postoperative nausea and vomiting)     slow to wake up   . Sleep apnea     cpap -0 setting at 16   . Shortness of breath dyspnea     with exertion   . Hypothyroidism   . Stress incontinence   . History of hiatal hernia   . GERD (gastroesophageal reflux disease)   . Arthritis     back and hips      Allergies: Allergies  Allergen Reactions  . Belviq [Lorcaserin Hcl]     Increased appetitie  . Contrave [Naltrexone-Bupropion Hcl Er]     Sleepy.  . Food     Big Red Gum  . Latuda [Lurasidone Hcl]     Severe aggitation  . Lipitor [Atorvastatin] Other (See Comments)    Muscle aches    Medications: Outpatient Encounter Prescriptions as of 02/08/2016  Medication Sig  . ALPRAZolam (XANAX) 0.5 MG tablet Take 1 tablet (0.5 mg total) by mouth at bedtime as needed for anxiety.  . AMBULATORY NON FORMULARY MEDICATION AccuCheck One Touch test strips and lancets Dx: E11.9  . amoxicillin (AMOXIL) 875 MG tablet Take 1 twice a day X 10 days.  Marland Kitchen BIOTIN PO Take 1 tablet by mouth daily.  Marland Kitchen CALCIUM PO Take by mouth.  . citalopram (CELEXA) 40 MG tablet Take 1 tablet (40 mg total) by mouth daily. Take one tablet of 38m qd. Brand name medically necessary.  . Cyanocobalamin (B-12 PO) Take by mouth.  . lamoTRIgine (LAMICTAL) 200 MG tablet Take 1 tablet (200 mg total) by mouth daily.  .Marland Kitchenlevothyroxine (SYNTHROID, LEVOTHROID) 25 MCG tablet TAKE 1 TABLET (25 MCG TOTAL) BY MOUTH DAILY BEFORE BREAKFAST ** NEED APPOINTMENT FOR REFILLS**  . Multiple Vitamin (MULTIVITAMIN) tablet Take 1 tablet by mouth daily.  . vitamin B-12 (CYANOCOBALAMIN) 1000 MCG tablet Take 1,000 mcg by  mouth daily.  . [DISCONTINUED] ALPRAZolam (XANAX) 0.5 MG tablet Take 1 tablet (0.5 mg total) by mouth at bedtime as needed for anxiety.  . [DISCONTINUED] citalopram (CELEXA) 40 MG tablet Take 1 tablet (40 mg total) by mouth daily. Take one tablet of 58m qd.  . [DISCONTINUED] lamoTRIgine (LAMICTAL) 200 MG tablet Take 1 tablet (200 mg total) by mouth daily.   No facility-administered encounter medications on file as of 02/08/2016.     Family History; Family History  Problem Relation Age of Onset  . Hyperlipidemia Mother   . Sleep apnea Mother   . Depression Mother   . Diabetes Father   . Hypertension Father   . COPD Father   .  Diabetes Paternal Aunt   . Diabetes Paternal Uncle   . Alcohol abuse Paternal Uncle   . Alcohol abuse Paternal Grandfather   . Depression Sister       Labs:  Recent Results (from the past 2160 hour(s))  Lipid panel     Status: Abnormal   Collection Time: 11/22/15  9:09 AM  Result Value Ref Range   Cholesterol 167 125 - 200 mg/dL   Triglycerides 96 <150 mg/dL   HDL 42 (L) >=46 mg/dL   Total CHOL/HDL Ratio 4.0 <=5.0 Ratio   VLDL 19 <30 mg/dL   LDL Cholesterol 106 <130 mg/dL    Comment:   Total Cholesterol/HDL Ratio:CHD Risk                        Coronary Heart Disease Risk Table                                        Men       Women          1/2 Average Risk              3.4        3.3              Average Risk              5.0        4.4           2X Average Risk              9.6        7.1           3X Average Risk             23.4       11.0 Use the calculated Patient Ratio above and the CHD Risk table  to determine the patient's CHD Risk.   TSH     Status: None   Collection Time: 11/22/15  9:09 AM  Result Value Ref Range   TSH 3.379 0.350 - 4.500 uIU/mL  COMPLETE METABOLIC PANEL WITH GFR     Status: Abnormal   Collection Time: 11/22/15  9:09 AM  Result Value Ref Range   Sodium 142 135 - 146 mmol/L   Potassium 4.4 3.5 - 5.3 mmol/L   Chloride 104 98 - 110 mmol/L   CO2 30 20 - 31 mmol/L   Glucose, Bld 79 65 - 99 mg/dL   BUN 15 7 - 25 mg/dL   Creat 0.49 (L) 0.50 - 1.10 mg/dL   Total Bilirubin 0.5 0.2 - 1.2 mg/dL   Alkaline Phosphatase 79 33 -  115 U/L   AST 23 10 - 35 U/L   ALT 28 6 - 29 U/L   Total Protein 6.4 6.1 - 8.1 g/dL   Albumin 4.2 3.6 - 5.1 g/dL   Calcium 9.2 8.6 - 10.2 mg/dL   GFR, Est African American >89 >=60 mL/min   GFR, Est Non African American >89 >=60 mL/min    Comment:   The estimated GFR is a calculation valid for adults (>=51 years old) that uses the CKD-EPI algorithm to adjust for age and sex. It is   not to be used for children, pregnant  women, hospitalized patients,    patients on dialysis, or with rapidly changing kidney function. According to the NKDEP, eGFR >89 is normal, 60-89 shows mild impairment, 30-59 shows moderate impairment, 15-29 shows severe impairment and <15 is ESRD.     CBC with Differential/Platelet     Status: Abnormal   Collection Time: 11/22/15  9:09 AM  Result Value Ref Range   WBC 7.7 4.0 - 10.5 K/uL   RBC 4.87 3.87 - 5.11 MIL/uL   Hemoglobin 14.5 12.0 - 15.0 g/dL   HCT 44.6 36.0 - 46.0 %   MCV 91.6 78.0 - 100.0 fL   MCH 29.8 26.0 - 34.0 pg   MCHC 32.5 30.0 - 36.0 g/dL   RDW 15.9 (H) 11.5 - 15.5 %   Platelets 377 150 - 400 K/uL   MPV 9.7 8.6 - 12.4 fL   Neutrophils Relative % 60 43 - 77 %   Neutro Abs 4.6 1.7 - 7.7 K/uL   Lymphocytes Relative 30 12 - 46 %   Lymphs Abs 2.3 0.7 - 4.0 K/uL   Monocytes Relative 5 3 - 12 %   Monocytes Absolute 0.4 0.1 - 1.0 K/uL   Eosinophils Relative 4 0 - 5 %   Eosinophils Absolute 0.3 0.0 - 0.7 K/uL   Basophils Relative 1 0 - 1 %   Basophils Absolute 0.1 0.0 - 0.1 K/uL   Smear Review Criteria for review not met   Ferritin     Status: None   Collection Time: 11/22/15  9:09 AM  Result Value Ref Range   Ferritin 19 10 - 291 ng/mL  B12     Status: None   Collection Time: 11/22/15  9:09 AM  Result Value Ref Range   Vitamin B-12 776 211 - 911 pg/mL  Vitamin D (25 hydroxy)     Status: None   Collection Time: 11/22/15  9:09 AM  Result Value Ref Range   Vit D, 25-Hydroxy 37 30 - 100 ng/mL    Comment: Vitamin D Status           25-OH Vitamin D        Deficiency                <20 ng/mL        Insufficiency         20 - 29 ng/mL        Optimal             > or = 30 ng/mL   For 25-OH Vitamin D testing on patients on D2-supplementation and patients for whom quantitation of D2 and D3 fractions is required, the QuestAssureD 25-OH VIT D, (D2,D3), LC/MS/MS is recommended: order code [id number] (patients > 2 yrs).   POCT rapid strep A     Status: None   Collection  Time: 02/04/16 10:37 AM  Result Value Ref Range   Rapid  Strep A Screen Negative Negative       Musculoskeletal: Strength & Muscle Tone: within normal limits Gait & Station: normal Patient leans: N/A  Mental Status Examination;   Psychiatric Specialty Exam: Physical Exam  Constitutional: She appears well-developed and well-nourished. No distress.  Skin: She is not diaphoretic.    Review of Systems  Cardiovascular: Negative for chest pain and palpitations.  Skin: Negative for rash.  Neurological: Negative for tremors and headaches.  Psychiatric/Behavioral: Negative for depression, suicidal ideas, hallucinations and substance abuse.    Blood pressure 122/64, pulse 78, height 5' 5.5" (1.664 m), weight 164 lb (74.39 kg), SpO2 96 %.Body mass index is 26.87 kg/(m^2).  General Appearance: Casual  Eye Contact::  Fair  Speech:  Normal Rate  Volume:  Normal  Mood:  euthymic  Affect:  Congruent  Thought Process:  Coherent  Orientation:  Full (Time, Place, and Person)  Thought Content:  Rumination  Suicidal Thoughts:  No  Homicidal Thoughts:  No  Memory:  Immediate;   Fair Recent;   Fair  Judgement:  Fair  Insight:  Shallow  Psychomotor Activity:  Normal  Concentration:  Fair  Recall:  Fair  Akathisia:  Negative  Handed:  Right  AIMS (if indicated):     Assets:  Communication Skills Desire for Improvement Financial Resources/Insurance Housing  Sleep:        Assessment: Axis I: mood disorder unspecified. Rule out mood disorder secondary to general medical condition. Major depressive disorder recurrent moderate. Rule out panic disorder. Generalized anxiety disorder  Axis II: deferred  Axis III:  Past Medical History  Diagnosis Date  . Hyperlipidemia   . Anxiety   . Depression   . Diabetes mellitus without complication ([place])   . PONV (postoperative nausea and vomiting)     slow to wake up   . Sleep apnea     cpap -0 setting at 16   . Shortness of breath dyspnea      with exertion   . Hypothyroidism   . Stress incontinence   . History of hiatal hernia   . GERD (gastroesophageal reflux disease)   . Arthritis     back and hips     Axis IV: psychosocial   Treatment Plan and Summary: continue Celexa to 40 mg. For depression, anxiety. Will write brand name. Dysthymia: celexa as above continue lamictal 253m qd for mood symptoms. Refills sent. Xanax 0.5 mg qd prn for anxiety and panic attacks. Refills given.  Bariatric surgery: recovering and loosing weight.   She is a non-smoker Pertinent Labs and Relevant Prior Notes reviewed. Medication Side effects, benefits and risks reviewed/discussed with Patient. Time given for patient to respond and asks questions regarding the Diagnosis and Medications. Safety concerns and to report to ER if suicidal or call 911.  Greater than 50% of time was spend in counseling and coordination of care with the patient.  Schedule for Follow up visit in 3 months or call in earlier as necessary. Time spent: 25 minutes   AMerian Capron MD 02/08/2016

## 2016-02-10 DIAGNOSIS — R0683 Snoring: Secondary | ICD-10-CM | POA: Diagnosis not present

## 2016-02-10 NOTE — Progress Notes (Signed)
  Patient Name: Shannon [name], Shannon [name] Date: 01/31/2016 Gender: Female D.O.B: 04/17/1966 Age (years): 50 Referring Provider: Iran Planas Height (inches): 66 Interpreting Physician: Baird Lyons MD, ABSM Weight (lbs): 160 RPSGT: Laren Everts BMI: 26 MRN: [id number] Neck Size: 13.00 CLINICAL INFORMATION Sleep Study Type: NPSG Indication for sleep study: Fatigue, OSA, Re-Evaluation, Snoring, Witnessed Apneas Epworth Sleepiness Score: 8  Most recent polysomnogram dated 01/31/2014 revealed an AHI of 46.0/h and RDI of 62.4/h. Most recent titration study dated 01/31/2014 was optimal at 14cm H2O with an AHI of 9.4/h.  SLEEP STUDY TECHNIQUE As per the AASM Manual for the Scoring of Sleep and Associated Events v2.3 (April 2016) with a hypopnea requiring 4% desaturations. The channels recorded and monitored were frontal, central and occipital EEG, electrooculogram (EOG), submentalis EMG (chin), nasal and oral airflow, thoracic and abdominal wall motion, anterior tibialis EMG, snore microphone, electrocardiogram, and pulse oximetry.  MEDICATIONS Patient's medications include: charted for review Medications self-administered by patient during sleep study : XANAX.  SLEEP ARCHITECTURE The study was initiated at 10:21:12 PM and ended at 4:30:09 AM. Sleep onset time was 70.8 minutes and the sleep efficiency was 54.1%. The total sleep time was 199.5 minutes. Stage REM latency was 148.0 minutes. The patient spent 21.30% of the night in stage N1 sleep, 65.16% in stage N2 sleep, 0.00% in stage N3 and 13.53% in REM. Alpha intrusion was absent. Supine sleep was 35.87%. Wake after sleep onset 98 minutes  RESPIRATORY PARAMETERS The overall apnea/hypopnea index (AHI) was 5.4 per hour. There were 2 total apneas, including 2 obstructive, 0 central and 0 mixed apneas. There were 16 hypopneas and 53 RERAs. The AHI during Stage REM sleep was 0.0 per hour. AHI while supine was 3.4 per hour. The mean oxygen  saturation was 93.55%. The minimum SpO2 during sleep was 89.00%. Loud snoring was noted during this study.  CARDIAC DATA The 2 lead EKG demonstrated sinus rhythm. The mean heart rate was 68.12 beats per minute. Other EKG findings include: PVCs.  LEG MOVEMENT DATA The total PLMS were 0 with a resulting PLMS index of 0.00. Associated arousal with leg movement index was 0.0 .  IMPRESSIONS - Minimal obstructive sleep apnea occurred during this study (AHI = 5.4/h). - No significant central sleep apnea occurred during this study (CAI = 0.0/h). - The patient had minimal or no oxygen desaturation during the study (Min O2 = 89.00%) - The patient snored with Loud snoring volume. - EKG findings include PVCs. - Clinically significant periodic limb movements did not occur during sleep. No significant associated arousals.  DIAGNOSIS - Obstructive Sleep Apnea (327.23 [G47.33 ICD-10]) - Primary Snoring (786.09 [R06.83 ICD-10])  RECOMMENDATIONS - Very mild obstructive sleep apnea. The upper limit of normal is an AHI of 5 events/ hour.  Return to discuss treatment options. - Avoid alcohol, sedatives and other CNS depressants that may worsen sleep apnea and disrupt normal sleep architecture. - Sleep hygiene should be reviewed to assess factors that may improve sleep quality. - Weight management and regular exercise should be initiated or continued if appropriate.  Deneise Lever Diplomate, American Board of Sleep Medicine  ELECTRONICALLY SIGNED ON:  02/10/2016, 10:12 AM [place] PH: [phone]   FX: [phone] [place]

## 2016-02-11 ENCOUNTER — Telehealth: Payer: Self-pay | Admitting: Physician Assistant

## 2016-02-11 NOTE — Telephone Encounter (Signed)
Call pt: sleep apnea much improved from 46 events an hour to 5 events an hour. Are you using cpap now? If you are we need to take settings way down. Are you still losing weight?

## 2016-02-14 NOTE — Telephone Encounter (Signed)
[facility] notifying pt of results.  Requested a return call regarding CPAP usage and weight loss.

## 2016-02-14 NOTE — Telephone Encounter (Signed)
Pt called back & left vm stating that she has started using her cpap again and feels much better.  She stated that she actually decreased the pressure herself from 15 to 8.

## 2016-02-15 NOTE — Telephone Encounter (Signed)
Ok will reevaluate in 2 months.

## 2016-02-15 NOTE — Telephone Encounter (Signed)
Pt said she feels like she is doing fine on 8, but if you feel that she needs formal testing, she will.  Please advise.

## 2016-02-15 NOTE — Telephone Encounter (Signed)
Would you like get formal testing to see what pressure you are best suited for?

## 2016-02-15 NOTE — Telephone Encounter (Signed)
Notified pt. 

## 2016-02-25 MED FILL — ALPRAZolam 0.5 MG TABS: 0.5 | 30 days supply | Qty: 30 | Fill #1

## 2016-02-27 MED FILL — CITALOPRAM HBR 40 MG TABLET: 40 | 30 days supply | Qty: 30 | Fill #0

## 2016-03-04 MED FILL — lamoTRIgine 200 MG TABS: 200 | 30 days supply | Qty: 30 | Fill #0

## 2016-04-02 DIAGNOSIS — G4733 Obstructive sleep apnea (adult) (pediatric): Secondary | ICD-10-CM | POA: Diagnosis not present

## 2016-04-04 MED FILL — ALPRAZolam 0.5 MG TABS: 0.5 | 30 days supply | Qty: 30 | Fill #0

## 2016-04-04 MED FILL — CITALOPRAM HBR 40 MG TABLET: 40 | 30 days supply | Qty: 30 | Fill #1

## 2016-04-07 ENCOUNTER — Encounter: Payer: Self-pay | Admitting: Emergency Medicine

## 2016-04-07 ENCOUNTER — Emergency Department
Admission: EM | Admit: 2016-04-07 | Discharge: 2016-04-07 | Disposition: A | Discharge status: Home / Self Care | Payer: 59 | Source: Home / Self Care | Attending: Family Medicine | Admitting: Family Medicine

## 2016-04-07 DIAGNOSIS — S00211A Abrasion of right eyelid and periocular area, initial encounter: Secondary | ICD-10-CM

## 2016-04-07 MED ORDER — ERYTHROMYCIN 5 MG/GM OP OINT: TOPICAL_OINTMENT | OPHTHALMIC | Status: DC

## 2016-04-07 NOTE — Discharge Instructions (Signed)
Apply ice pack for 5 to 10 minutes, 3 to 4 times daily  Continue until swelling decreases.   If symptoms become significantly worse during the night or over the weekend, proceed to the local emergency room.

## 2016-04-07 NOTE — ED Provider Notes (Signed)
CSN: [id number]     Arrival date & time 04/07/16  1042 History   First MD Initiated Contact with Patient 04/07/16 1157     Chief Complaint  Patient presents with  . Eye Injury      HPI Comments: Patient was in bed two hours ago after working all night when her cat scratched her right upper eyelid.  The cat is current on her rabies vaccine and is not ill.  Patient's Tdap is current.  Patient is a 50 y.o. female presenting with eye problem. The history is provided by the patient.  Eye Problem Location:  R eye Quality:  Burning Severity:  Mild Onset quality:  Sudden Duration:  2 hours Timing:  Constant Progression:  Unchanged Chronicity:  New Context comment:  Cat scratch to upper eyelid Relieved by:  None tried Ineffective treatments:  None tried Associated symptoms: no blurred vision, no decreased vision, no discharge, no double vision, no foreign body sensation, no headaches, no photophobia, no redness, no swelling and no tearing     Past Medical History  Diagnosis Date  . Hyperlipidemia   . Anxiety   . Depression   . Diabetes mellitus without complication ([place])   . PONV (postoperative nausea and vomiting)     slow to wake up   . Sleep apnea     cpap -0 setting at 14   . Shortness of breath dyspnea     with exertion   . Hypothyroidism   . Stress incontinence   . History of hiatal hernia   . GERD (gastroesophageal reflux disease)   . Arthritis     back and hips    Past Surgical History  Procedure Laterality Date  . Appendectomy    . Feet surgery    . Wisdom tooth extraction    . Laproscopy    . Excision morton's neuroma Right 04/04/99    second interspace, right foot  . Excision morton's neuroma Left 04/04/99    second interspace, left foot  . Breath tek h pylori N/A 04/03/2015    Procedure: BREATH TEK H PYLORI;  Surgeon: Greer Pickerel, MD;  Location: [facility];  Service: General;  Laterality: N/A;  . Cesarean section    . Laparoscopic gastric sleeve resection  with hiatal hernia repair N/A 08/13/2015    Procedure: LAPAROSCOPIC GASTRIC SLEEVE RESECTION WITH HIATAL HERNIA REPAIR;  Surgeon: Greer Pickerel, MD;  Location: [facility];  Service: General;  Laterality: N/A;  . Upper gi endoscopy  08/13/2015    Procedure: UPPER GI ENDOSCOPY;  Surgeon: Greer Pickerel, MD;  Location: [facility];  Service: General;;   Family History  Problem Relation Age of Onset  . Hyperlipidemia Mother   . Sleep apnea Mother   . Depression Mother   . Diabetes Father   . Hypertension Father   . COPD Father   . Diabetes Paternal Aunt   . Diabetes Paternal Uncle   . Alcohol abuse Paternal Uncle   . Alcohol abuse Paternal Grandfather   . Depression Sister    Social History  Substance Use Topics  . Smoking status: Never Smoker   . Smokeless tobacco: Never Used  . Alcohol Use: No   OB History    No data available     Review of Systems  Eyes: Negative for blurred vision, double vision, photophobia, discharge and redness.  Neurological: Negative for headaches.  All other systems reviewed and are negative.   Allergies  Belviq; Contrave; Food; Latuda; and Lipitor  Home Medications  Prior to Admission medications   Medication Sig Start Date End Date Taking? Authorizing Provider  ALPRAZolam Duanne Moron) 0.5 MG tablet Take 1 tablet (0.5 mg total) by mouth at bedtime as needed for anxiety. 02/08/16   Merian Capron, MD  AMBULATORY NON FORMULARY MEDICATION AccuCheck One Touch test strips and lancets Dx: E11.9 02/09/15   Jade L Breeback, PA-C  amoxicillin (AMOXIL) 875 MG tablet Take 1 twice a day X 10 days. 02/04/16   Jacqulyn Cane, MD  BIOTIN PO Take 1 tablet by mouth daily.    Historical Provider, MD  CALCIUM PO Take by mouth.    Historical Provider, MD  citalopram (CELEXA) 40 MG tablet Take 1 tablet (40 mg total) by mouth daily. Take one tablet of 40mg  qd. Brand name medically necessary. 02/08/16   Merian Capron, MD  Cyanocobalamin (B-12 PO) Take by mouth.    Historical Provider, MD    erythromycin ophthalmic ointment Apply small amount ointment to abrasion on eyelid TID until healed. 04/07/16   Kandra Nicolas, MD  lamoTRIgine (LAMICTAL) 200 MG tablet Take 1 tablet (200 mg total) by mouth daily. 02/08/16   Merian Capron, MD  levothyroxine (SYNTHROID, LEVOTHROID) 25 MCG tablet TAKE 1 TABLET (25 MCG TOTAL) BY MOUTH DAILY BEFORE BREAKFAST ** NEED APPOINTMENT FOR REFILLS** 08/31/15   Jade L Breeback, PA-C  Multiple Vitamin (MULTIVITAMIN) tablet Take 1 tablet by mouth daily.    Historical Provider, MD  vitamin B-12 (CYANOCOBALAMIN) 1000 MCG tablet Take 1,000 mcg by mouth daily.    Historical Provider, MD   Meds Ordered and Administered this Visit  Medications - No data to display  BP 122/82 mmHg  Pulse 74  Temp(Src) 98 F (36.7 C) (Oral)  Ht 5\' 5"  (1.651 m)  Wt 165 lb (74.844 kg)  BMI 27.46 kg/m2  SpO2 95% No data found.   Physical Exam  Constitutional: She appears well-developed and well-nourished. No distress.  HENT:  Right Ear: External ear normal.  Left Ear: External ear normal.  Nose: Nose normal.  Mouth/Throat: Oropharynx is clear and moist.  Eyes: Conjunctivae and EOM are normal. Pupils are equal, round, and reactive to light. Lids are everted and swept, no foreign bodies found. Right eye exhibits no discharge. No foreign body present in the right eye.    Right upper eyelid has a superficial scratch about 29mm long.  No swelling or tenderness to palpation.  Lid eversion negative.  Fluorescein shows no uptake to cornea.  Nursing note and vitals reviewed.   ED Course  Procedures  Wound right upper eyelid cleansed with HibiClens solution and saline.  Applied small amount of genamicin ophthalmic ointment to scratch.  Visual Acuity Review  Right Eye Distance: 20/25 Left Eye Distance: 20/25 Bilateral Distance: 20/20 (without correction)    MDM   1. Abrasion of right eyelid, initial encounter    Rx for erythromycin ophthalmic ointment:  Apply to abrasion  TID Apply ice pack for 5 to 10 minutes, 3 to 4 times daily  Continue until swelling decreases.   If symptoms become significantly worse during the night or over the weekend, proceed to the local emergency room.  Followup with Family Doctor if not improved in one week.     Kandra Nicolas, MD 04/07/16 [phone]

## 2016-04-07 NOTE — ED Notes (Signed)
Right eye injury, her cat jumped up and scratched her, it hurts and burns.

## 2016-04-11 ENCOUNTER — Other Ambulatory Visit: Payer: Self-pay

## 2016-04-11 NOTE — Patient Outreach (Signed)
[facility]) Care Management  04/11/2016  ISMAHAN KUMPF 02/02/1966 [id number]  Phone call to schedule appointment.  No answer and message left.  Peter Garter RN, [facility] Care Management Coordinator-Link to [facility] [phone]

## 2016-04-14 ENCOUNTER — Ambulatory Visit: Payer: Self-pay | Admitting: Dietician

## 2016-04-23 MED FILL — lamoTRIgine 200 MG TABS: 200 | 30 days supply | Qty: 30 | Fill #1

## 2016-04-25 ENCOUNTER — Encounter (HOSPITAL_COMMUNITY): Payer: Self-pay | Admitting: Psychiatry

## 2016-05-02 ENCOUNTER — Ambulatory Visit (HOSPITAL_COMMUNITY): Payer: Self-pay | Admitting: Psychiatry

## 2016-05-19 MED FILL — CITALOPRAM HBR 40 MG TABLET: 40 | 30 days supply | Qty: 30 | Fill #1

## 2016-05-30 ENCOUNTER — Ambulatory Visit (INDEPENDENT_AMBULATORY_CARE_PROVIDER_SITE_OTHER): Payer: 59 | Admitting: Psychiatry

## 2016-05-30 ENCOUNTER — Other Ambulatory Visit (HOSPITAL_COMMUNITY): Payer: Self-pay | Admitting: Psychiatry

## 2016-05-30 ENCOUNTER — Encounter (HOSPITAL_COMMUNITY): Payer: Self-pay | Admitting: Psychiatry

## 2016-05-30 DIAGNOSIS — F331 Major depressive disorder, recurrent, moderate: Secondary | ICD-10-CM | POA: Diagnosis not present

## 2016-05-30 DIAGNOSIS — F411 Generalized anxiety disorder: Secondary | ICD-10-CM

## 2016-05-30 DIAGNOSIS — F0630 Mood disorder due to known physiological condition, unspecified: Secondary | ICD-10-CM | POA: Diagnosis not present

## 2016-05-30 DIAGNOSIS — F410 Panic disorder [episodic paroxysmal anxiety] without agoraphobia: Secondary | ICD-10-CM | POA: Diagnosis not present

## 2016-05-30 MED ORDER — ALPRAZOLAM 0.5 MG PO TABS: 0.5000 mg | ORAL_TABLET | Freq: Every evening | ORAL | 0 refills | Status: DC | PRN

## 2016-05-30 MED ORDER — CITALOPRAM HYDROBROMIDE 40 MG PO TABS: 40.0000 mg | ORAL_TABLET | Freq: Every day | ORAL | 1 refills | Status: DC

## 2016-05-30 MED ORDER — LAMOTRIGINE 200 MG PO TABS: 200.0000 mg | ORAL_TABLET | Freq: Every day | ORAL | 1 refills | Status: DC

## 2016-05-30 NOTE — Progress Notes (Signed)
Patient ID: Shannon [name], female   DOB: July 15, 1966, 50 y.o.   MRN: [id number]  [facility] Follow up visit  Shannon [name] [id number] 50 y.o.  05/30/2016 11:43 AM  Chief Complaint:  Depression follow up  History of Present Illness:   Patient Presents for follow up and medication management for Major depression and Generalized anxiety disorder.  Depression; .celexa brand name works better. Mood is balanced. Last time again got the other brand and it didn't work as good.  Bariatric surgery; done in October . She continues to loose weight and feels less tired. Has adjusted her Cpap pressure as well. Anxiety; infrequent , worries about her son.  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  No side effects on Lamictal or rash She has gone thru divorce, which has been a significant contributing factor to her depression. Now has a boyfriend.  Severity of depression. 7 out of 10. 10 being no depression.  Her other factors include difficult marriage that started 20 years ago leading to divorced. She now take care of her son 64 years of age  Modifying factors; she likes to work on crafts goes to [employer] and also arts work.  She has been diagnosed with mood disorder there is no clear history of bipolar but she does get irritable or edgy for some time but she feels it is out of frustration and depression    Past Psychiatric History/Hospitalization(s) Manged for depression and mood symptoms for more then 20 years.  Prior Suicide Attempts: No  Medical History; Past Medical History:  Diagnosis Date  . Anxiety   . Arthritis    back and hips   . Depression   . Diabetes mellitus without complication ([place])   . GERD (gastroesophageal reflux disease)   . History of hiatal hernia   . Hyperlipidemia   . Hypothyroidism   . PONV (postoperative nausea and vomiting)    slow to wake up   . Shortness of breath dyspnea    with exertion   . Sleep apnea    cpap -0  setting at 14   . Stress incontinence     Allergies: Allergies  Allergen Reactions  . Belviq [Lorcaserin Hcl]     Increased appetitie  . Contrave [Naltrexone-Bupropion Hcl Er]     Sleepy.  . Food     Big Red Gum  . Latuda [Lurasidone Hcl]     Severe aggitation  . Lipitor [Atorvastatin] Other (See Comments)    Muscle aches    Medications: Outpatient Encounter Prescriptions as of 05/30/2016  Medication Sig  . ALPRAZolam (XANAX) 0.5 MG tablet Take 1 tablet (0.5 mg total) by mouth at bedtime as needed for anxiety.  . AMBULATORY NON FORMULARY MEDICATION AccuCheck One Touch test strips and lancets Dx: E11.9  . amoxicillin (AMOXIL) 875 MG tablet Take 1 twice a day X 10 days.  Marland Kitchen BIOTIN PO Take 1 tablet by mouth daily.  Marland Kitchen CALCIUM PO Take by mouth.  . citalopram (CELEXA) 40 MG tablet Take 1 tablet (40 mg total) by mouth daily. Take one tablet of 40mg  qd. Brand name medically necessary.  . Cyanocobalamin (B-12 PO) Take by mouth.  . erythromycin ophthalmic ointment Apply small amount ointment to abrasion on eyelid TID until healed.  . lamoTRIgine (LAMICTAL) 200 MG tablet Take 1 tablet (200 mg total) by mouth daily.  Marland Kitchen levothyroxine (SYNTHROID, LEVOTHROID) 25 MCG tablet TAKE 1 TABLET (25 MCG TOTAL) BY MOUTH DAILY BEFORE BREAKFAST ** NEED APPOINTMENT  FOR REFILLS**  . Multiple Vitamin (MULTIVITAMIN) tablet Take 1 tablet by mouth daily.  . vitamin B-12 (CYANOCOBALAMIN) 1000 MCG tablet Take 1,000 mcg by mouth daily.  . [DISCONTINUED] ALPRAZolam (XANAX) 0.5 MG tablet Take 1 tablet (0.5 mg total) by mouth at bedtime as needed for anxiety.  . [DISCONTINUED] citalopram (CELEXA) 40 MG tablet Take 1 tablet (40 mg total) by mouth daily. Take one tablet of 40mg  qd. Brand name medically necessary.  . [DISCONTINUED] lamoTRIgine (LAMICTAL) 200 MG tablet Take 1 tablet (200 mg total) by mouth daily.   No facility-administered encounter medications on file as of 05/30/2016.      Family History; Family  History  Problem Relation Age of Onset  . Hyperlipidemia Mother   . Sleep apnea Mother   . Depression Mother   . Diabetes Father   . Hypertension Father   . COPD Father   . Diabetes Paternal Aunt   . Diabetes Paternal Uncle   . Alcohol abuse Paternal Uncle   . Alcohol abuse Paternal Grandfather   . Depression Sister       Labs:  No results found for this or any previous visit (from the past 2160 hour(s)).     Musculoskeletal: Strength & Muscle Tone: within normal limits Gait & Station: normal Patient leans: N/A  Mental Status Examination;   Psychiatric Specialty Exam: Physical Exam  Constitutional: She appears well-developed and well-nourished. No distress.  Skin: She is not diaphoretic.    Review of Systems  Cardiovascular: Negative for chest pain.  Gastrointestinal: Negative for nausea.  Skin: Negative for rash.  Neurological: Negative for tremors and headaches.  Psychiatric/Behavioral: Negative for depression and hallucinations.    There were no vitals taken for this visit.There is no height or weight on file to calculate BMI.  General Appearance: Casual  Eye Contact::  Fair  Speech:  Normal Rate  Volume:  Normal  Mood:  euthymic  Affect:  Congruent  Thought Process:  Coherent  Orientation:  Full (Time, Place, and Person)  Thought Content:  Rumination  Suicidal Thoughts:  No  Homicidal Thoughts:  No  Memory:  Immediate;   Fair Recent;   Fair  Judgement:  Fair  Insight:  Shallow  Psychomotor Activity:  Normal  Concentration:  Fair  Recall:  Fair  Akathisia:  Negative  Handed:  Right  AIMS (if indicated):     Assets:  Communication Skills Desire for Improvement Financial Resources/Insurance Housing  Sleep:        Assessment: Axis I: mood disorder unspecified. Rule out mood disorder secondary to general medical condition. Major depressive disorder recurrent moderate. Rule out panic disorder. Generalized anxiety disorder   Axis III:  Past  Medical History:  Diagnosis Date  . Anxiety   . Arthritis    back and hips   . Depression   . Diabetes mellitus without complication ([place])   . GERD (gastroesophageal reflux disease)   . History of hiatal hernia   . Hyperlipidemia   . Hypothyroidism   . PONV (postoperative nausea and vomiting)    slow to wake up   . Shortness of breath dyspnea    with exertion   . Sleep apnea    cpap -0 setting at 14   . Stress incontinence     Axis IV: psychosocial   Treatment Plan and Summary: continue Celexa to 40 mg. For depression, anxiety. Will write brand name.and she can inform manufacturer prior filling up. Dysthymia: celexa as above continue lamictal 200mg  qd for mood  symptoms. Refills printed Xanax 0.5 mg qd prn for anxiety and panic attacks. Refills given.  Bariatric surgery: recovering and loosing weight.   She is a non-smoker Pertinent Labs and Relevant Prior Notes reviewed. Medication Side effects, benefits and risks reviewed/discussed with Patient. Time given for patient to respond and asks questions regarding the Diagnosis and Medications. Safety concerns and to report to ER if suicidal or call 911.  Greater than 50% of time was spend in counseling and coordination of care with the patient.  Schedule for Follow up visit in 3 months or call in earlier as necessary. Time spent: 25 minutes   Merian Capron, MD 05/30/2016 Patient ID: Shannon [name], female   DOB: [birth year], 50 y.o.   MRN: [id number]

## 2016-05-30 NOTE — Telephone Encounter (Signed)
Received fax from [facility] requesting a refill for Lamictal 200mg . Per Dr. De Nurse, refill request is denied. Pt was last seen 02/08/16. Pt will need to schedule an appt w/ clinic. lvm for pt to return call to clinic to schedule appt.

## 2016-06-03 MED FILL — ALPRAZolam 0.5 MG TABS: 0.5 | 30 days supply | Qty: 30 | Fill #0

## 2016-06-03 MED FILL — lamoTRIgine 200 MG TABS: 200 | 30 days supply | Qty: 30 | Fill #0

## 2016-06-05 ENCOUNTER — Encounter: Payer: Self-pay | Admitting: Sports Medicine

## 2016-06-05 ENCOUNTER — Ambulatory Visit (INDEPENDENT_AMBULATORY_CARE_PROVIDER_SITE_OTHER): Payer: 59 | Admitting: Sports Medicine

## 2016-06-05 DIAGNOSIS — M5136 Other intervertebral disc degeneration, lumbar region: Secondary | ICD-10-CM

## 2016-06-05 DIAGNOSIS — M51369 Other intervertebral disc degeneration, lumbar region without mention of lumbar back pain or lower extremity pain: Secondary | ICD-10-CM

## 2016-06-05 NOTE — Assessment & Plan Note (Signed)
Did well for almost 2 years after a left L5-S1 interlaminar epidural. Restarting rehabilitation exercises and we are going to proceed with another L5-S1 interlaminar epidural. She did much better post gastric sleeve.

## 2016-06-05 NOTE — Progress Notes (Signed)
  Subjective:    CC: Low back pain  HPI: This is a pleasant 50 year old female, I treated her about a year and a half ago for left lumbar radiculopathy left L5-S1 interlaminar epidural. She needed 2 injections but ultimately did well for 2 years. She then had gastric sleeve and felt even better, she's had a recurrence of pain over the past several months, moderate, persistent, localized and left-sided low back with left-sided radicular symptoms as well as. At this point she does desire to proceed with another epidural, no trauma, changes in activity, no bowel or bladder function, saddle numbness, no constitutional symptoms.  Past medical history, Surgical history, Family history not pertinant except as noted below, Social history, Allergies, and medications have been entered into the medical record, reviewed, and no changes needed.   Review of Systems: No fevers, chills, night sweats, weight loss, chest pain, or shortness of breath.   Objective:    General: Well Developed, well nourished, and in no acute distress.  Neuro: Alert and oriented x3, extra-ocular muscles intact, sensation grossly intact.  HEENT: Normocephalic, atraumatic, pupils equal round reactive to light, neck supple, no masses, no lymphadenopathy, thyroid nonpalpable.  Skin: Warm and dry, no rashes. Cardiac: Regular rate and rhythm, no murmurs rubs or gallops, no lower extremity edema.  Respiratory: Clear to auscultation bilaterally. Not using accessory muscles, speaking in full sentences. Back Exam:  Inspection: Unremarkable  Motion: Flexion 45 deg, Extension 45 deg, Side Bending to 45 deg bilaterally,  Rotation to 45 deg bilaterally  SLR laying: Negative  XSLR laying: Negative  Palpable tenderness: None. FABER: negative. Sensory change: Gross sensation intact to all lumbar and sacral dermatomes.  Reflexes: 2+ at both patellar tendons, 2+ at achilles tendons, Babinski's downgoing.  Strength at foot  Plantar-flexion: 5/5  Dorsi-flexion: 5/5 Eversion: 5/5 Inversion: 5/5  Leg strength  Quad: 5/5 Hamstring: 5/5 Hip flexor: 5/5 Hip abductors: 5/5  Gait unremarkable.  Impression and Recommendations:    DDD (degenerative disc disease), lumbar Did well for almost 2 years after a left L5-S1 interlaminar epidural. Restarting rehabilitation exercises and we are going to proceed with another L5-S1 interlaminar epidural. She did much better post gastric sleeve.

## 2016-06-19 ENCOUNTER — Ambulatory Visit
Admission: RE | Admit: 2016-06-19 | Discharge: 2016-06-19 | Disposition: A | Discharge status: Home / Self Care | Payer: 59 | Source: Ambulatory Visit | Attending: Sports Medicine | Admitting: Sports Medicine

## 2016-06-19 DIAGNOSIS — M47817 Spondylosis without myelopathy or radiculopathy, lumbosacral region: Secondary | ICD-10-CM | POA: Diagnosis not present

## 2016-06-19 MED ORDER — METHYLPREDNISOLONE ACETATE 40 MG/ML INJ SUSP (RADIOLOG
120.0000 mg | Freq: Once | INTRAMUSCULAR | Status: AC
  Administered 2016-06-19: 120 mg via EPIDURAL

## 2016-06-19 MED ORDER — IOPAMIDOL (ISOVUE-M 200) INJECTION 41%
1.0000 mL | Freq: Once | INTRAMUSCULAR | Status: AC
  Administered 2016-06-19: 1 mL via EPIDURAL

## 2016-06-19 NOTE — Discharge Instructions (Signed)

## 2016-07-03 MED FILL — CITALOPRAM HBR 40 MG TABLET: 40 | 30 days supply | Qty: 30 | Fill #0

## 2016-07-11 MED FILL — lamoTRIgine 200 MG TABS: 200 | 30 days supply | Qty: 30 | Fill #1

## 2016-07-25 ENCOUNTER — Ambulatory Visit: Payer: Self-pay | Admitting: Sports Medicine

## 2016-07-28 ENCOUNTER — Telehealth (HOSPITAL_COMMUNITY): Payer: Self-pay | Admitting: *Deleted

## 2016-07-28 MED ORDER — ALPRAZOLAM 0.5 MG PO TABS: 0.5000 mg | ORAL_TABLET | Freq: Every evening | ORAL | 0 refills | Status: DC | PRN

## 2016-07-28 NOTE — Telephone Encounter (Signed)
Medication refill- received fax from [facility] requesting a refill for Xanax 0.5mg . Per Dr. De Nurse, refill is authorized for Xanax 0.5mg , #30. Prescription printed for pick up. Called and informed pt of refill status. Pt verbalizes understanding.  Pt's f/u appt is 11/9.

## 2016-07-31 ENCOUNTER — Encounter: Payer: Self-pay | Admitting: Sports Medicine

## 2016-07-31 ENCOUNTER — Ambulatory Visit (INDEPENDENT_AMBULATORY_CARE_PROVIDER_SITE_OTHER): Payer: 59 | Admitting: Sports Medicine

## 2016-07-31 ENCOUNTER — Ambulatory Visit (INDEPENDENT_AMBULATORY_CARE_PROVIDER_SITE_OTHER): Payer: 59

## 2016-07-31 DIAGNOSIS — M5030 Other cervical disc degeneration, unspecified cervical region: Secondary | ICD-10-CM | POA: Diagnosis not present

## 2016-07-31 DIAGNOSIS — M542 Cervicalgia: Secondary | ICD-10-CM | POA: Diagnosis not present

## 2016-07-31 DIAGNOSIS — M47812 Spondylosis without myelopathy or radiculopathy, cervical region: Secondary | ICD-10-CM | POA: Diagnosis not present

## 2016-07-31 DIAGNOSIS — M5136 Other intervertebral disc degeneration, lumbar region: Secondary | ICD-10-CM

## 2016-07-31 DIAGNOSIS — M436 Torticollis: Secondary | ICD-10-CM

## 2016-07-31 DIAGNOSIS — M51369 Other intervertebral disc degeneration, lumbar region without mention of lumbar back pain or lower extremity pain: Secondary | ICD-10-CM

## 2016-07-31 DIAGNOSIS — M7918 Myalgia, other site: Secondary | ICD-10-CM | POA: Insufficient documentation

## 2016-07-31 NOTE — Assessment & Plan Note (Signed)
After a 2 year response to the first left L5-S1 interlaminar epidural she has gotten partial response to her most recent left L5-S1 interlaminar epidural. Ordering the repeat, #2 of 3.

## 2016-07-31 NOTE — Progress Notes (Signed)
   Subjective:    I'm seeing this patient as a consultation for:  Iran Planas, PA-C  CC: Neck pain  HPI:. This is a pleasant 50 year old female with several months of neck pain and weakness, worse with flexion, nothing radicular. No lower extremity symptoms. No trauma.  She also has lumbar degenerative disc disease, she had a left L5-S1 interlaminar epidural that provided 2 years of relief, more recently we got another epidural and she has partial relief and is agreeable to go with #2 of 3.  Past medical history:  Negative.  See flowsheet/record as well for more information.  Surgical history: Negative.  See flowsheet/record as well for more information.  Family history: Negative.  See flowsheet/record as well for more information.  Social history: Negative.  See flowsheet/record as well for more information.  Allergies, and medications have been entered into the medical record, reviewed, and no changes needed.   Review of Systems: No headache, visual changes, nausea, vomiting, diarrhea, constipation, dizziness, abdominal pain, skin rash, fevers, chills, night sweats, weight loss, swollen lymph nodes, body aches, joint swelling, muscle aches, chest pain, shortness of breath, mood changes, visual or auditory hallucinations.   Objective:   General: Well Developed, well nourished, and in no acute distress.  Neuro/Psych: Alert and oriented x3, extra-ocular muscles intact, able to move all 4 extremities, sensation grossly intact. Skin: Warm and dry, no rashes noted.  Respiratory: Not using accessory muscles, speaking in full sentences, trachea midline.  Cardiovascular: Pulses palpable, no extremity edema. Abdomen: Does not appear distended. Neck: Negative spurling's Full neck range of motion Grip strength and sensation normal in bilateral hands Strength good C4 to T1 distribution No sensory change to C4 to T1 Reflexes normal  Impression and Recommendations:   This case required  medical decision making of moderate complexity.  DDD (degenerative disc disease), cervical Neck pain, weakness. Formal physical therapy, x-rays. Return in one month  DDD (degenerative disc disease), lumbar After a 2 year response to the first left L5-S1 interlaminar epidural she has gotten partial response to her most recent left L5-S1 interlaminar epidural. Ordering the repeat, #2 of 3.

## 2016-07-31 NOTE — Assessment & Plan Note (Signed)
Neck pain, weakness. Formal physical therapy, x-rays. Return in one month

## 2016-08-04 ENCOUNTER — Encounter: Payer: Self-pay | Admitting: Physical Therapy

## 2016-08-04 ENCOUNTER — Ambulatory Visit (INDEPENDENT_AMBULATORY_CARE_PROVIDER_SITE_OTHER): Payer: 59 | Admitting: Physical Therapy

## 2016-08-04 DIAGNOSIS — M436 Torticollis: Secondary | ICD-10-CM

## 2016-08-04 DIAGNOSIS — R29898 Other symptoms and signs involving the musculoskeletal system: Secondary | ICD-10-CM | POA: Diagnosis not present

## 2016-08-04 DIAGNOSIS — M542 Cervicalgia: Secondary | ICD-10-CM | POA: Diagnosis not present

## 2016-08-04 NOTE — Patient Instructions (Addendum)
Decompression Exercise: Basic pillow under head   [place] [phone]        Lie on back on firm surface, knees bent, feet flat, arms turned up, out to sides, backs of hands down. Time _5-15__ minutes. Surface: floor   Head Press With Chin Tilt - pillow under head    Tilt chin neutral  up, mouth closed. Feel weight on back of head. Increase weight on head by pressing head down. Hold _3-5__ seconds. Relax. Repeat _10__ times. Once a day  Surface: floor    Shoulder Press - pillow under head    Press both shoulders down. Hold _3-5__ seconds. Repeat _10__ times.  Once a day.  Side Pull: Double Arm    On back, knees bent, feet flat. Arms perpendicular to body, shoulder level, elbows straight but relaxed. Pull arms out to sides, elbows straight. Resistance band comes across collarbones, hands toward floor. Hold momentarily. Slowly return to starting position. Repeat _1-3x10_ times. Band color ___yellow__   Scapular Retraction (Standing)    With arms at sides, pinch shoulder blades together. Repeat __3-4__ times per set. Do ___1_ sets per session. Do __4-5__ sessions per day.  Flexibility: Neck Retraction    Pull head straight back, keeping eyes straight, perform 3-4 times, __1 sets per session. Do __4-5__ sessions per day. Copyright  VHI. All rights reserved.    TENS UNIT: This is helpful for muscle pain and spasm.   Search and Purchase a TENS 7000 2nd edition at www.tenspros.com. It should be less than $30.     TENS unit instructions: Do not shower or bathe with the unit on Turn the unit off before removing electrodes or batteries If the electrodes lose stickiness add a drop of water to the electrodes after they are disconnected from the unit and place on plastic sheet. If you continued to have difficulty, call the TENS unit company to purchase more electrodes. Do not apply lotion on the skin area prior to use. Make sure the skin is clean and dry as this will help  prolong the life of the electrodes. After use, always check skin for unusual red areas, rash or other skin difficulties. If there are any skin problems, does not apply electrodes to the same area. Never remove the electrodes from the unit by pulling the wires. Do not use the TENS unit or electrodes other than as directed. Do not change electrode placement without consultating your therapist or physician. Keep 2 fingers with between each electrode. Wear time ratio is 2:1, on to off times.    For example on for 30 minutes off for 15 minutes and then on for 30 minutes off for 15 minutes
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
## 2016-08-06 ENCOUNTER — Ambulatory Visit (INDEPENDENT_AMBULATORY_CARE_PROVIDER_SITE_OTHER): Payer: 59 | Admitting: Physical Therapy

## 2016-08-06 DIAGNOSIS — R29898 Other symptoms and signs involving the musculoskeletal system: Secondary | ICD-10-CM | POA: Diagnosis not present

## 2016-08-06 DIAGNOSIS — M542 Cervicalgia: Secondary | ICD-10-CM | POA: Diagnosis not present

## 2016-08-06 DIAGNOSIS — M436 Torticollis: Secondary | ICD-10-CM | POA: Diagnosis not present

## 2016-08-06 NOTE — Therapy (Signed)
[place], [place], [address] Phone: [phone]   Fax:  [phone]  Physical Therapy Treatment  Patient Details  Name: Shannon [name] MRN: [id number] Date of Birth: August 13, 1966 Referring Provider: Dr. Helane Rima   Encounter Date: 08/06/2016      PT End of Session - 08/06/16 0802    Visit Number 2   Number of Visits 8   Date for PT Re-Evaluation 09/01/16   PT Start Time 0800   PT Stop Time 0903   PT Time Calculation (min) 63 min      Past Medical History:  Diagnosis Date  . Anxiety   . Arthritis    back and hips   . Depression   . Diabetes mellitus without complication ([place])   . GERD (gastroesophageal reflux disease)   . History of hiatal hernia   . Hyperlipidemia   . Hypothyroidism   . PONV (postoperative nausea and vomiting)    slow to wake up   . Shortness of breath dyspnea    with exertion   . Sleep apnea    cpap -0 setting at 16   . Stress incontinence     Past Surgical History:  Procedure Laterality Date  . APPENDECTOMY    . BREATH TEK H PYLORI N/A 04/03/2015   Procedure: BREATH TEK H PYLORI;  Surgeon: Greer Pickerel, MD;  Location: [facility];  Service: General;  Laterality: N/A;  . CESAREAN SECTION    . 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   second interspace, left foot  . feet surgery    . LAPAROSCOPIC GASTRIC SLEEVE RESECTION WITH HIATAL HERNIA REPAIR N/A 08/13/2015   Procedure: LAPAROSCOPIC GASTRIC SLEEVE RESECTION WITH HIATAL HERNIA REPAIR;  Surgeon: Greer Pickerel, MD;  Location[facility];  Service: General;  Laterality: N/A;  . laproscopy    . UPPER GI ENDOSCOPY  08/13/2015   Procedure: UPPER GI ENDOSCOPY;  Surgeon: Greer Pickerel, MD;  Location: [facility];  Service: General;;  . WISDOM TOOTH EXTRACTION      There were no vitals filed for this visit.      Subjective Assessment - 08/06/16 0803    Subjective Pt reports not much changes since last visit.  She has been very busy at work (somewhat stressful  at times)    Currently in Pain? Yes   Pain Score 4    Pain Location Neck   Pain Orientation Right   Pain Descriptors / Indicators --  knotty            [facility] PT Assessment - 08/06/16 0001      Assessment   Medical Diagnosis cervical pain   Referring Provider Dr. Helane Rima    Onset Date/Surgical Date 07/28/16   Hand Dominance Right   Next MD Visit not scheduled yet             [facility] Adult PT Treatment/Exercise - 08/06/16 0001      Neck Exercises: Supine   Neck Retraction 5 secs;10 reps   Other Supine Exercise 10 reps shoulder presses, 3 sec.  snow angels x 8 reps, repeated on 1/2 foam roll.       Other Supine Exercise On 1/2 foam roll:  shoulder horizontal abd with yellow band x 12, overhead pull with yellow band x 12 reps.   Thoracic ext (at various levels) over bolster x 5 reps      Modalities  Modalities Electrical Stimulation;Moist Heat     Moist Heat Therapy   Number Minutes Moist Heat 15 Minutes   Moist Heat Location Cervical  thoracic     Electrical Stimulation   Electrical Stimulation Location Rt upper trap, bilat cervical paraspinals, mid thoracic paraspinals   Electrical Stimulation Action IFC   Electrical Stimulation Parameters  to tolerance    Electrical Stimulation Goals Pain     Manual Therapy   Manual Therapy Myofascial release;Soft tissue mobilization;Scapular mobilization;Passive ROM   Soft tissue mobilization to bilat cervical paraspinals.     Myofascial Release suboccipital release,  Rt upper trap, pin and stretch to Rt scalene and playtsma.    Scapular Mobilization Rt scap in all directions    Passive ROM attempted lateral flexion/ rotation - very guarded.                      PT Long Term Goals - 08/04/16 [id number]      PT LONG TERM GOAL #1   Title I with advanced HEP ( 09/01/16)    Time 4   Period Weeks   Status  New     PT LONG TERM GOAL #2   Title demo painfree cervical motion WFL, rotation =/> 60 degrees ( 09/01/16)    Time 4   Period Weeks   Status New     PT LONG TERM GOAL #3   Title demo Rt UE strength =/> Lt UE  ( 09/01/16)    Time 4   Period Weeks   Status New     PT LONG TERM GOAL #4   Title demo Rt grip strength =/> 5/5 ( 09/01/16)    Time 4   Period Weeks   Status New     PT LONG TERM GOAL #5   Title improve FOTO =/> 35% limited ( 09/01/16)    Time 4   Period Weeks   Status New               Plan - 08/06/16 [id number]    Clinical Impression Statement Pt continues with tightness in Rt side of neck/ upper thoracic.  She tolerated all exercises with minimal increase in pain. She was quite guarded with manual therapy.  She reported reduction of pain at end of session with the use of estim/ MHP.     Rehab Potential Excellent   PT Frequency 2x / week   PT Duration 4 weeks   PT Treatment/Interventions Moist Heat;Traction;Ultrasound;Therapeutic exercise;Dry needling;Taping;Manual techniques;Cryotherapy;Electrical Stimulation;Functional mobility training;Patient/family education   PT Next Visit Plan progress postural realignment exercise, upper back strengthening.  Thoracic mobs   Consulted and Agree with Plan of Care Patient      Patient will benefit from skilled therapeutic intervention in order to improve the following deficits and impairments:  Postural dysfunction, Decreased strength, Pain, Impaired UE functional use, Decreased range of motion  Visit Diagnosis: Pain, neck  Weakness of right arm  Stiffness of neck     Problem List Patient Active Problem List   Diagnosis Date Noted  . DDD (degenerative disc disease), cervical 07/31/2016  . Hiatal hernia 08/13/2015  . 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 Generalized anxiety disorder 03/16/2015  . BMI 38.0-38.9,adult 12/29/2014  . DDD (degenerative disc disease), lumbar 08/28/2014  . Hypothyroidism  08/15/2014  . Diabetes mellitus type II, controlled ([place]) 08/15/2014  . OSA on CPAP 03/31/2014  . Abnormal weight gain 03/31/2014  . Sleep apnea 02/05/2012  . Mood disorder ([place]) 02/05/2012  .  Hyperlipidemia 11/22/2008   Kerin Perna, PTA 08/06/16 9:58 AM  [facility] [place], [place], [address] Phone: [phone]   Fax:  [phone]  Name: Shannon [name] MRN: [id number] Date of Birth: Feb 18, 1966

## 2016-08-07 MED FILL — CITALOPRAM HBR 40 MG TABLET: 40 | 30 days supply | Qty: 30 | Fill #1

## 2016-08-07 MED FILL — ALPRAZolam 0.5 MG TABS: 0.5 | 30 days supply | Qty: 30 | Fill #0

## 2016-08-11 ENCOUNTER — Encounter: Payer: Self-pay | Admitting: Physical Therapy

## 2016-08-13 ENCOUNTER — Other Ambulatory Visit: Payer: Self-pay

## 2016-08-15 ENCOUNTER — Encounter: Payer: Self-pay | Admitting: Physical Therapy

## 2016-08-15 ENCOUNTER — Ambulatory Visit (INDEPENDENT_AMBULATORY_CARE_PROVIDER_SITE_OTHER): Payer: 59 | Admitting: Physical Therapy

## 2016-08-15 DIAGNOSIS — R29898 Other symptoms and signs involving the musculoskeletal system: Secondary | ICD-10-CM

## 2016-08-15 DIAGNOSIS — M542 Cervicalgia: Secondary | ICD-10-CM

## 2016-08-15 DIAGNOSIS — M436 Torticollis: Secondary | ICD-10-CM | POA: Diagnosis not present

## 2016-08-15 NOTE — Therapy (Signed)
[place], [place], [address] Phone: [phone]   Fax:  [phone]  Physical Therapy Treatment  Patient Details  Name: Shannon [name] MRN: [id number] Date of Birth: [birth year] Referring Provider: Dr Dianah Field  Encounter Date: 08/15/2016      PT End of Session - 08/15/16 0806    Visit Number 3   Number of Visits 8   Date for PT Re-Evaluation 09/01/16   PT Start Time 0805   PT Stop Time 0902   PT Time Calculation (min) 57 min   Activity Tolerance Patient tolerated treatment well      Past Medical History:  Diagnosis Date  . Anxiety   . Arthritis    back and hips   . Depression   . Diabetes mellitus without complication ([place])   . GERD (gastroesophageal reflux disease)   . History of hiatal hernia   . Hyperlipidemia   . Hypothyroidism   . PONV (postoperative nausea and vomiting)    slow to wake up   . Shortness of breath dyspnea    with exertion   . Sleep apnea    cpap -0 setting at 16   . Stress incontinence     Past Surgical History:  Procedure Laterality Date  . APPENDECTOMY    . BREATH TEK H PYLORI N/A 04/03/2015   Procedure: BREATH TEK H PYLORI;  Surgeon: Greer Pickerel, MD;  Location: [facility];  Service: General;  Laterality: N/A;  . CESAREAN SECTION    . 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   second interspace, left foot  . feet surgery    . LAPAROSCOPIC GASTRIC SLEEVE RESECTION WITH HIATAL HERNIA REPAIR N/A 08/13/2015   Procedure: LAPAROSCOPIC GASTRIC SLEEVE RESECTION WITH HIATAL HERNIA REPAIR;  Surgeon: Greer Pickerel, MD;  Location: [facility];  Service: General;  Laterality: N/A;  . laproscopy    . UPPER GI ENDOSCOPY  08/13/2015   Procedure: UPPER GI ENDOSCOPY;  Surgeon: Greer Pickerel, MD;  Location: [facility];  Service: General;;  . WISDOM TOOTH EXTRACTION      There were no vitals filed for this  visit.      Subjective Assessment - 08/15/16 0808    Subjective Exercise is helping, she is sore in her neck.   Patient Stated Goals get help for the weakness.    Currently in Pain? Yes   Pain Score 2    Pain Location Neck   Pain Orientation Right;Left   Pain Descriptors / Indicators Aching   Pain Type Chronic pain   Pain Onset 1 to 4 weeks ago   Pain Frequency Intermittent   Aggravating Factors  sleeping   Pain Relieving Factors heat and stretches            [facility] PT Assessment - 08/15/16 0001      Assessment   Medical Diagnosis cervical pain   Referring Provider Dr Dianah Field   Onset Date/Surgical Date 07/28/16     AROM   Cervical Flexion 34   Cervical Extension 50   Cervical - Right Side Bend 45   Cervical - Left Side Bend 50   Cervical - Right Rotation 62   Cervical - Left Rotation 50  with pain                     [facility] Adult PT Treatment/Exercise - 08/15/16 0001      Neck Exercises:  Machines for Strengthening   UBE (Upper Arm Bike) L2x4' alt FWD/BWD     Neck Exercises: Supine   Neck Retraction 15 reps  pressing into a ball off EOB   Cervical Rotation 10 reps;Both  with 2 sec hold each side   Other Supine Exercise 2x10, red band, overhead pull, SASH, ER      Modalities   Modalities Ultrasound;Electrical Stimulation;Moist Heat     Moist Heat Therapy   Number Minutes Moist Heat 15 Minutes   Moist Heat Location Cervical     Electrical Stimulation   Electrical Stimulation Location cervical   Electrical Stimulation Action IFC   Electrical Stimulation Parameters to tolerance   Electrical Stimulation Goals Pain     Ultrasound   Ultrasound Location Lt upper trap   Ultrasound Parameters combo US/stim, 100%, 1.37mz, 1.0w/cm2   Ultrasound Goals Pain  tone                PT Education - 08/15/16 0843    Education provided Yes   Education Details TDN   Person(s) Educated Patient   Methods Explanation;Handout   Comprehension  Verbalized understanding             PT Long Term Goals - 08/15/16 0848      PT LONG TERM GOAL #1   Title I with advanced HEP ( 09/01/16)    Status On-going     PT LONG TERM GOAL #2   Title demo painfree cervical motion WFL, rotation =/> 60 degrees ( 09/01/16)    Status Partially Met     PT LONG TERM GOAL #3   Title demo Rt UE strength =/> Lt UE  ( 09/01/16)    Status On-going     PT LONG TERM GOAL #4   Title demo Rt grip strength =/> 5/5 ( 09/01/16)    Status On-going     PT LONG TERM GOAL #5   Title improve FOTO =/> 35% limited ( 09/01/16)    Status On-going               Plan - 08/15/16 0848    Clinical Impression Statement Liahna is making progress, she has partially met one of her goals.  It is her third visit.  She continues to have a trigger point in her Rt levator and would benefit from TDN for this if she is interested.    Rehab Potential Excellent   PT Frequency 2x / week   PT Duration 4 weeks   PT Treatment/Interventions Moist Heat;Traction;Ultrasound;Therapeutic exercise;Dry needling;Taping;Manual techniques;Cryotherapy;Electrical Stimulation;Functional mobility training;Patient/family education   PT Next Visit Plan possile TDN   Consulted and Agree with Plan of Care Patient      Patient will benefit from skilled therapeutic intervention in order to improve the following deficits and impairments:  Postural dysfunction, Decreased strength, Pain, Impaired UE functional use, Decreased range of motion  Visit Diagnosis: Stiffness of neck  Weakness of right arm  Pain, neck     Problem List Patient Active Problem List   Diagnosis Date Noted  . DDD (degenerative disc disease), cervical 07/31/2016  . Hiatal hernia 08/13/2015  . 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 Generalized anxiety disorder 03/16/2015  . BMI 38.0-38.9,adult 12/29/2014  . DDD (degenerative disc disease), lumbar 08/28/2014  . Hypothyroidism 08/15/2014  . Diabetes  m[place]itus type II, controlled ([place] 08/15/2014  . OSA on CPAP 03/31/2014  . Abnormal weight gain 03/31/2014  . Sleep apnea 02/05/2012  . Mood disorder ([place] 02/05/2012  .  Hyperlipidemia 11/22/2008    Jeral Pinch PT  08/15/2016, 8:50 AM  [facility] [place], [place], [address] Phone: [phone]   Fax:  [phone]  Name: MIHO MONDA MRN: [id number] Date of Birth: 1965-10-30

## 2016-08-15 NOTE — Patient Instructions (Signed)

## 2016-08-22 ENCOUNTER — Other Ambulatory Visit (HOSPITAL_COMMUNITY): Payer: Self-pay | Admitting: Psychiatry

## 2016-08-26 ENCOUNTER — Ambulatory Visit (INDEPENDENT_AMBULATORY_CARE_PROVIDER_SITE_OTHER): Payer: 59 | Admitting: Physical Therapy

## 2016-08-26 DIAGNOSIS — M436 Torticollis: Secondary | ICD-10-CM

## 2016-08-26 DIAGNOSIS — M542 Cervicalgia: Secondary | ICD-10-CM | POA: Diagnosis not present

## 2016-08-26 DIAGNOSIS — R29898 Other symptoms and signs involving the musculoskeletal system: Secondary | ICD-10-CM | POA: Diagnosis not present

## 2016-08-26 MED FILL — lamoTRIgine 200 MG TABS: 200 | 30 days supply | Qty: 30 | Fill #0

## 2016-08-26 NOTE — Telephone Encounter (Signed)
Received fax from [facility] requesting a refill for Lamictal. Per Dr. De Nurse, refill is authorized for Lamictal 200mg , #30. Rx was sent to pharmacy. Pt f/u apt is schedule on 11/9. lvm for pt to return a call to the office.

## 2016-08-26 NOTE — Therapy (Signed)
Shannon [name] [place], [place], [address] Phone: [phone]   Fax:  [phone]  Physical Therapy Treatment  Patient Details  Name: Shannon [name] MRN: [id number] Date of Birth: 10-24-1965 Referring Provider: Dr. Helane Rima   Encounter Date: 08/26/2016      PT End of Session - 08/26/16 0806    Visit Number 4   Number of Visits 8   Date for PT Re-Evaluation 09/01/16   PT Start Time 0803   PT Stop Time 0851   PT Time Calculation (min) 48 min      Past Medical History:  Diagnosis Date  . Anxiety   . Arthritis    back and hips   . Depression   . Diabetes mellitus without complication ([place])   . GERD (gastroesophageal reflux disease)   . History of hiatal hernia   . Hyperlipidemia   . Hypothyroidism   . PONV (postoperative nausea and vomiting)    slow to wake up   . Shortness of breath dyspnea    with exertion   . Sleep apnea    cpap -0 setting at 16   . Stress incontinence     Past Surgical History:  Procedure Laterality Date  . APPENDECTOMY    . BREATH TEK H PYLORI N/A 04/03/2015   Procedure: BREATH TEK H PYLORI;  Surgeon: Greer Pickerel, MD;  Location: [facility];  Service: General;  Laterality: N/A;  . CESAREAN SECTION    . 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   second interspace, left foot  . feet surgery    . LAPAROSCOPIC GASTRIC SLEEVE RESECTION WITH HIATAL HERNIA REPAIR N/A 08/13/2015   Procedure: LAPAROSCOPIC GASTRIC SLEEVE RESECTION WITH HIATAL HERNIA REPAIR;  Surgeon: Greer Pickerel, MD;  Location: [facility];  Service: General;  Laterality: N/A;  . laproscopy    . UPPER GI ENDOSCOPY  08/13/2015   Procedure: UPPER GI ENDOSCOPY;  Surgeon: Greer Pickerel, MD;  Location: [facility];  Service: General;;  . WISDOM TOOTH EXTRACTION      There were no vitals filed for this visit.      Subjective Assessment - 08/26/16 0808    Subjective Pt reports she has had some improvement (10%), but the relief after each session hasn't lasted more than 1.5 days.  She has been completed HEP 1x/day.     Currently in Pain? Yes   Pain Score 2    Pain Location Neck   Pain Orientation Right   Pain Descriptors / Indicators Aching   Aggravating Factors  leaning over patients, sleeping positions   Pain Relieving Factors heat, stretches            [facility] PT Assessment - 08/26/16 0001      Assessment   Medical Diagnosis cervical pain   Referring Provider Dr. Helane Rima    Onset Date/Surgical Date 07/28/16   Next MD Visit not scheduled yet            [facility] Adult PT Treatment/Exercise - 08/26/16 0001      Exercises   Exercises Shoulder;Neck     Neck Exercises: Machines for Strengthening   UBE (Upper Arm Bike) L2x3.5' alt FWD/BWD seated     Neck Exercises: Supine   Neck Retraction 15 reps  pressing into a ball off EOB   Other Supine Exercise  Thoracic ext (at various levels) over bolster x 5 reps      Neck  Exercises: Sidelying   Other Sidelying Exercise thoracic rotation with horiz abdct with arms x 5 each side, then repeated with red band x 5 reps.       Theme park manager Rt / Lt upper trap    Electrical Stimulation Action combo [place]   Electrical Stimulation Parameters to tolerance    Electrical Stimulation Goals Tone;Pain     Ultrasound   Ultrasound Location Rt/ Lt upper trap    Ultrasound Parameters combo US/ 100%, 1.2 w/cm2 x 5 min each side (10 min total)    Ultrasound Goals Pain     Manual Therapy   Soft tissue mobilization to bilat cervical paraspinals.     Myofascial Release suboccipital release,  Rt upper trap, pin and stretch to Rt scalene and playtsma.      Neck Exercises: Stretches   Other Neck Stretches Doorway stretch - 3 positions, 20 sec each (unilateral for upper position for improved comfort)                      PT Long Term Goals -  08/26/16 1307      PT LONG TERM GOAL #1   Title I with advanced HEP ( 09/01/16)    Time 4   Period Weeks   Status On-going     PT LONG TERM GOAL #2   Title demo painfree cervical motion WFL, rotation =/> 60 degrees ( 09/01/16)    Time 4   Period Weeks   Status Partially Met     PT LONG TERM GOAL #3   Title demo Rt UE strength =/> Lt UE  ( 09/01/16)    Time 4   Period Weeks   Status On-going     PT LONG TERM GOAL #4   Title demo Rt grip strength within 5# of Lt hand.   changes per Letta Kocher, PT   Time 4   Period Weeks   Status On-going     PT LONG TERM GOAL #5   Title improve FOTO =/> 35% limited ( 09/01/16)    Time 4   Period Weeks   Status On-going               Plan - 08/26/16 1302    Clinical Impression Statement Pt tolerated all exercises well except sidelying thoracic rotation to the Rt, this increased her low back pain. Pt encouraged to notify therapist if exercise is causing increased pain.  She continues with some guarding during manual therapy.   she is not interested in TDN, but reports she is interested in continuation of therapy to decrease her pain and improve her mobility.    Rehab Potential Excellent   PT Frequency 2x / week   PT Duration 4 weeks   PT Treatment/Interventions Moist Heat;Traction;Ultrasound;Therapeutic exercise;Dry needling;Taping;Manual techniques;Cryotherapy;Electrical Stimulation;Functional mobility training;Patient/family education   PT Next Visit Plan Assess response to combo [place], continue progressive spine stabilization.  Assess cervical ROM.     Consulted and Agree with Plan of Care Patient      Patient will benefit from skilled therapeutic intervention in order to improve the following deficits and impairments:  Postural dysfunction, Decreased strength, Pain, Impaired UE functional use, Decreased range of motion  Visit Diagnosis: Stiffness of neck  Weakness of right arm  Pain, neck     Problem List Patient Active  Problem List   Diagnosis Date Noted  . DDD (degenerative disc disease), cervical 07/31/2016  . Hiatal hernia 08/13/2015  .  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 Generalized anxiety disorder 03/16/2015  . BMI 38.0-38.9,adult 12/29/2014  . DDD (degenerative disc disease), lumbar 08/28/2014  . Hypothyroidism 08/15/2014  . Diabetes mellitus type II, controlled ([place]) 08/15/2014  . OSA on CPAP 03/31/2014  . Abnormal weight gain 03/31/2014  . Sleep apnea 02/05/2012  . Mood disorder ([place]) 02/05/2012  . Hyperlipidemia 11/22/2008   Kerin Perna, PTA 08/26/16 1:08 PM  [facility] [place], [place], [address] Phone: [phone]   Fax:  [phone]  Name: GLENDELL SCHLOTTMAN MRN: [id number] Date of Birth: 1966-04-14

## 2016-08-28 ENCOUNTER — Ambulatory Visit (INDEPENDENT_AMBULATORY_CARE_PROVIDER_SITE_OTHER): Payer: 59 | Admitting: Psychiatry

## 2016-08-28 ENCOUNTER — Ambulatory Visit (INDEPENDENT_AMBULATORY_CARE_PROVIDER_SITE_OTHER): Payer: 59 | Admitting: Physical Therapy

## 2016-08-28 ENCOUNTER — Encounter (HOSPITAL_COMMUNITY): Payer: Self-pay | Admitting: Psychiatry

## 2016-08-28 VITALS — BP 122/80 | HR 69 | Resp 14 | Ht 65.5 in | Wt 184.4 lb

## 2016-08-28 DIAGNOSIS — F331 Major depressive disorder, recurrent, moderate: Secondary | ICD-10-CM | POA: Diagnosis not present

## 2016-08-28 DIAGNOSIS — M542 Cervicalgia: Secondary | ICD-10-CM | POA: Diagnosis not present

## 2016-08-28 DIAGNOSIS — F410 Panic disorder [episodic paroxysmal anxiety] without agoraphobia: Secondary | ICD-10-CM

## 2016-08-28 DIAGNOSIS — F411 Generalized anxiety disorder: Secondary | ICD-10-CM

## 2016-08-28 DIAGNOSIS — Z9109 Other allergy status, other than to drugs and biological substances: Secondary | ICD-10-CM

## 2016-08-28 DIAGNOSIS — R29898 Other symptoms and signs involving the musculoskeletal system: Secondary | ICD-10-CM | POA: Diagnosis not present

## 2016-08-28 DIAGNOSIS — Z818 Family history of other mental and behavioral disorders: Secondary | ICD-10-CM

## 2016-08-28 DIAGNOSIS — Z833 Family history of diabetes mellitus: Secondary | ICD-10-CM

## 2016-08-28 DIAGNOSIS — Z79899 Other long term (current) drug therapy: Secondary | ICD-10-CM

## 2016-08-28 DIAGNOSIS — M436 Torticollis: Secondary | ICD-10-CM | POA: Diagnosis not present

## 2016-08-28 DIAGNOSIS — Z8349 Family history of other endocrine, nutritional and metabolic diseases: Secondary | ICD-10-CM

## 2016-08-28 DIAGNOSIS — Z811 Family history of alcohol abuse and dependence: Secondary | ICD-10-CM

## 2016-08-28 DIAGNOSIS — Z8489 Family history of other specified conditions: Secondary | ICD-10-CM

## 2016-08-28 MED ORDER — LAMOTRIGINE 200 MG PO TABS: 200.0000 mg | ORAL_TABLET | Freq: Every day | ORAL | 1 refills | Status: DC

## 2016-08-28 MED ORDER — CITALOPRAM HYDROBROMIDE 40 MG PO TABS: 40.0000 mg | ORAL_TABLET | Freq: Every day | ORAL | 1 refills | Status: DC

## 2016-08-28 NOTE — Therapy (Signed)
[place], [place], [address] Phone: [phone]   Fax:  [phone]  Physical Therapy Treatment  Patient Details  Name: Shannon [name] MRN: [id number] Date of Birth: 1966-08-17 Referring Provider: Dr. Helane Rima  Encounter Date: 08/28/2016      PT End of Session - 08/28/16 0810    Visit Number 5   Number of Visits 8   Date for PT Re-Evaluation 09/01/16   PT Start Time 0804   PT Stop Time 0847   PT Time Calculation (min) 43 min   Behavior During Therapy [facility] for tasks assessed/performed      Past Medical History:  Diagnosis Date  . Anxiety   . Arthritis    back and hips   . Depression   . Diabetes mellitus without complication ([place])   . GERD (gastroesophageal reflux disease)   . History of hiatal hernia   . Hyperlipidemia   . Hypothyroidism   . PONV (postoperative nausea and vomiting)    slow to wake up   . Shortness of breath dyspnea    with exertion   . Sleep apnea    cpap -0 setting at 16   . Stress incontinence     Past Surgical History:  Procedure Laterality Date  . APPENDECTOMY    . BREATH TEK H PYLORI N/A 04/03/2015   Procedure: BREATH TEK H PYLORI;  Surgeon: Greer Pickerel, MD;  Location: [facility];  Service: General;  Laterality: N/A;  . CESAREAN SECTION    . 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   second interspace, left foot  . feet surgery    . LAPAROSCOPIC GASTRIC SLEEVE RESECTION WITH HIATAL HERNIA REPAIR N/A 08/13/2015   Procedure: LAPAROSCOPIC GASTRIC SLEEVE RESECTION WITH HIATAL HERNIA REPAIR;  Surgeon: Greer Pickerel, MD;  Location: [facility];  Service: General;  Laterality: N/A;  . laproscopy    . UPPER GI ENDOSCOPY  08/13/2015   Procedure: UPPER GI ENDOSCOPY;  Surgeon: Greer Pickerel, MD;  Location: [facility];  Service: General;;  . WISDOM TOOTH EXTRACTION      There were no vitals filed for this  visit.      Subjective Assessment - 08/28/16 0810    Subjective Pt reports frustration with neck pain. "I feel like my neck just doesn't have the support it needs".  she states it feels good to hold the back of neck with hand.  She has noticed she can look over her Lt shoulder a little more than when she initially came in.    Pertinent History lumbar injections, surgery - gastric sleeve and had gas stuck in her neck after this, had to use medication for the pain.  Chiropractic care for neck and back   Patient Stated Goals get help for the weakness.    Currently in Pain? Yes   Pain Score 5    Pain Location Neck   Pain Orientation Right   Pain Descriptors / Indicators Aching   Aggravating Factors  leaning over patients, sleeping position   Pain Relieving Factors heat, stretches             [facility] PT Assessment - 08/28/16 0001      Assessment   Medical Diagnosis cervical pain   Referring Provider Dr. Helane Rima   Onset Date/Surgical Date 07/28/16   Hand Dominance Right   Next MD Visit not scheduled yet  [facility] Adult PT Treatment/Exercise - 08/28/16 0001      Neck Exercises: Machines for Strengthening   Other Machines for Strengthening NuStep (arms/legs) x 6.5 min @ L4     Neck Exercises: Standing   Other Standing Exercises scap squeeze around pool noodle x 5 sec hold x 10 reps.  Then passive stretch into scap retraction x 20 sec x 3 reps      Neck Exercises: Seated   Other Seated Exercise cervical diagonals x 10 reps, and cervical flexion, 10 reps (AROM)     Shoulder Exercises: Prone   Retraction Strengthening;Both  with axial ext, 8 total reps     Shoulder Exercises: Stretch   Other Shoulder Stretches Doorway stretch mid and low level x 20 sec x 2 reps each side.      Acupuncturist Location Rt upper trap/cervical paraspinals    Chartered certified accountant combo [place]   Electrical Stimulation Parameters to tolerance    Electrical Stimulation Goals Tone;Pain     Ultrasound   Ultrasound Location Rt upper trap/ cervical paraspinals.   Ultrasound Parameters combo US/ 100%, 1.3 w/cm2, 100%   Ultrasound Goals Pain  tightness     Manual Therapy   Manual Therapy Soft tissue mobilization;Taping   Manual therapy comments Pt in seated position   Soft tissue mobilization Edge tool assistance to Rt upper trap, rhomboid, cervical paraspinals, levator to decrease fascial restrictions, improve cervical ROM    Kinesiotex Create Space;Inhibit Muscle     Kinesiotix   Create Space Lift strip applied to Rt levator to decompress tissue and decrease pain.     Inhibit Muscle  I strip @ 10% stretch to Rt upper trap, Rt cervical paraspinals. (also applied to increase proprioception)                     PT Long Term Goals - 08/26/16 1307      PT LONG TERM GOAL #1   Title I with advanced HEP ( 09/01/16)    Time 4   Period Weeks   Status On-going     PT LONG TERM GOAL #2   Title demo painfree cervical motion WFL, rotation =/> 60 degrees ( 09/01/16)    Time 4   Period Weeks   Status Partially Met     PT LONG TERM GOAL #3   Title demo Rt UE strength =/> Lt UE  ( 09/01/16)    Time 4   Period Weeks   Status On-going     PT LONG TERM GOAL #4   Title demo Rt grip strength within 5# of Lt hand.   changes per Letta Kocher, PT   Time 4   Period Weeks   Status On-going     PT LONG TERM GOAL #5   Title improve FOTO =/> 35% limited ( 09/01/16)    Time 4   Period Weeks   Status On-going               Plan - 08/28/16 0902    Clinical Impression Statement Pt had limited tolerance to prone exercise; able to complete 8 reps of axial extension.  Continued notable tightness in Rt levator, upper trap, cervical paraspinals with manual therapy.  Trial of Rock tape applied today. Pt having less Rt shoulder discomfort with functional activities.  Making gradual progress towards remaining goals.    Rehab  Potential Excellent   PT Frequency 2x / week   PT Duration 4 weeks  PT Treatment/Interventions Moist Heat;Traction;Ultrasound;Therapeutic exercise;Dry needling;Taping;Manual techniques;Cryotherapy;Electrical Stimulation;Functional mobility training;Patient/family education   PT Next Visit Plan Assess response to [facility] tape.  continue progressive spine stabilization.  Assess goals; end of POC.    Consulted and Agree with Plan of Care Patient      Patient will benefit from skilled therapeutic intervention in order to improve the following deficits and impairments:  Postural dysfunction, Decreased strength, Pain, Impaired UE functional use, Decreased range of motion  Visit Diagnosis: Stiffness of neck  Weakness of right arm  Pain, neck     Problem List Patient Active Problem List   Diagnosis Date Noted  . DDD (degenerative disc disease), cervical 07/31/2016  . Hiatal hernia 08/13/2015  . 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 Generalized anxiety disorder 03/16/2015  . BMI 38.0-38.9,adult 12/29/2014  . DDD (degenerative disc disease), lumbar 08/28/2014  . Hypothyroidism 08/15/2014  . Diabetes mellitus type II, controlled ([place]) 08/15/2014  . OSA on CPAP 03/31/2014  . Abnormal weight gain 03/31/2014  . Sleep apnea 02/05/2012  . Mood disorder ([place]) 02/05/2012  . Hyperlipidemia 11/22/2008    Shelbie Hutching 08/28/2016, 9:28 AM  [facility] [place], [place], [address] Phone: [phone]   Fax:  [phone]  Name: Shannon [name] MRN: [id number] Date of Birth: 12-20-1965

## 2016-08-28 NOTE — Progress Notes (Signed)
Patient ID: Shannon [name], female   DOB: 1966-04-23, 50 y.o.   MRN: [id number]  [facility] Follow up visit  Shannon [name] [id number] 50 y.o.  08/28/2016 9:04 AM  Chief Complaint:  Depression follow up  History of Present Illness:   Patient Presents for follow up and medication management for Major depression and Generalized anxiety disorder.  Depression; .celexa brand name works reasonable Mood is balanced. On lamictal. No rash.  Works night shift so some concern of sleep during the day.  Bariatric surgery; done in October . She continues to loose weight  Pain: gets injection for back pain and physical therapy Anxiety; infrequent , worries about her son.  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    Severity of depression. 7 out of 10. 10 being no depression.  Aggravating factor: difficult divorce. She now take care of her son 37 years of age  Modifying factors; she likes to work on crafts goes to [employer] and also arts work.    Past Psychiatric History/Hospitalization(s) Manged for depression and mood symptoms for more then 20 years.  Prior Suicide Attempts: No  Medical History; Past Medical History:  Diagnosis Date  . Anxiety   . Arthritis    back and hips   . Depression   . Diabetes mellitus without complication ([place])   . GERD (gastroesophageal reflux disease)   . History of hiatal hernia   . Hyperlipidemia   . Hypothyroidism   . PONV (postoperative nausea and vomiting)    slow to wake up   . Shortness of breath dyspnea    with exertion   . Sleep apnea    cpap -0 setting at 16   . Stress incontinence     Allergies: Allergies  Allergen Reactions  . Food Swelling    Big Red Gum makes her tongue swell  . Latuda [Lurasidone Hcl] Other (See Comments)    Severe aggitation  . Lipitor [Atorvastatin] Other (See Comments)    Muscle aches  . Belviq [Lorcaserin Hcl]     Increased appetitie  . Contrave [Naltrexone-Bupropion Hcl  Er] Other (See Comments)    Sleepy.    Medications: Outpatient Encounter Prescriptions as of 08/28/2016  Medication Sig  . ALPRAZolam (XANAX) 0.5 MG tablet Take 1 tablet (0.5 mg total) by mouth at bedtime as needed for anxiety.  Marland Kitchen BIOTIN PO Take 1 tablet by mouth daily.  Marland Kitchen CALCIUM PO Take by mouth.  . citalopram (CELEXA) 40 MG tablet Take 1 tablet (40 mg total) by mouth daily. Take one tablet of 40mg  qd. Brand name medically necessary.  . lamoTRIgine (LAMICTAL) 200 MG tablet Take 1 tablet (200 mg total) by mouth daily.  . Multiple Vitamin (MULTIVITAMIN) tablet Take 1 tablet by mouth daily.  . vitamin B-12 (CYANOCOBALAMIN) 1000 MCG tablet Take 1,000 mcg by mouth daily.  . [DISCONTINUED] citalopram (CELEXA) 40 MG tablet Take 1 tablet (40 mg total) by mouth daily. Take one tablet of 40mg  qd. Brand name medically necessary.  . [DISCONTINUED] lamoTRIgine (LAMICTAL) 200 MG tablet TAKE 1 TABLET BY MOUTH DAILY   No facility-administered encounter medications on file as of 08/28/2016.      Family History; Family History  Problem Relation Age of Onset  . Hyperlipidemia Mother   . Sleep apnea Mother   . Depression Mother   . Diabetes Father   . Hypertension Father   . COPD Father   . Diabetes Paternal Aunt   . Diabetes Paternal Uncle   .  Alcohol abuse Paternal Uncle   . Alcohol abuse Paternal Grandfather   . Depression Sister       Labs:  No results found for this or any previous visit (from the past 2160 hour(s)).     Musculoskeletal: Strength & Muscle Tone: within normal limits Gait & Station: normal Patient leans: N/A  Mental Status Examination;   Psychiatric Specialty Exam: Physical Exam  Constitutional: She appears well-developed and well-nourished. No distress.  Skin: She is not diaphoretic.    Review of Systems  Cardiovascular: Negative for palpitations.  Gastrointestinal: Negative for nausea.  Skin: Negative for itching.  Neurological: Negative for tremors  and headaches.  Psychiatric/Behavioral: Negative for depression and hallucinations.    Blood pressure 122/80, pulse 69, resp. rate 14, height 5' 5.5" (1.664 m), weight 184 lb 6.4 oz (83.6 kg), last menstrual period 07/19/2016, SpO2 96 %.Body mass index is 30.22 kg/m.  General Appearance: Casual  Eye Contact::  Fair  Speech:  Normal Rate  Volume:  Normal  Mood:  euthymic  Affect:  Congruent  Thought Process:  Coherent  Orientation:  Full (Time, Place, and Person)  Thought Content:  Rumination  Suicidal Thoughts:  No  Homicidal Thoughts:  No  Memory:  Immediate;   Fair Recent;   Fair  Judgement:  Fair  Insight:  Shallow  Psychomotor Activity:  Normal  Concentration:  Fair  Recall:  Fair  Akathisia:  Negative  Handed:  Right  AIMS (if indicated):     Assets:  Communication Skills Desire for Improvement Financial Resources/Insurance Housing  Sleep:        Assessment: Axis I: mood disorder unspecified. Rule out mood disorder secondary to general medical condition. Major depressive disorder recurrent moderate. Rule out panic disorder. Generalized anxiety disorder   Axis III:  Past Medical History:  Diagnosis Date  . Anxiety   . Arthritis    back and hips   . Depression   . Diabetes mellitus without complication ([place])   . GERD (gastroesophageal reflux disease)   . History of hiatal hernia   . Hyperlipidemia   . Hypothyroidism   . PONV (postoperative nausea and vomiting)    slow to wake up   . Shortness of breath dyspnea    with exertion   . Sleep apnea    cpap -0 setting at 16   . Stress incontinence     Axis IV: psychosocial   Treatment Plan and Summary: continue Celexa  40 mg. For depression, anxiety. Will write brand name.and she can inform manufacturer prior filling up. Dysthymia: celexa as above continue lamictal 200mg  qd for mood symptoms. Refills printed Xanax 0.5 mg qd prn for anxiety and panic attacks.  Anxiety manageable but sometimes have to take  prn xanax. Can call for refill.  Bariatric surgery: recovering and loosing weight.   She is a non-smoker Pertinent Labs and Relevant Prior Notes reviewed. Medication Side effects, benefits and risks reviewed/discussed with Patient. Time given for patient to respond and asks questions regarding the Diagnosis and Medications. Safety concerns and to report to ER if suicidal or call 911.  Greater than 50% of time was spend in counseling and coordination of care with the patient, including patient education, individual therapy and working on coping skills . Schedule for Follow up visit in 3 months or call in earlier as necessary. Time spent face to face: 25 minutes   Merian Capron, MD 08/28/2016 Patient ID: Arleta Creek, female   DOB: 14-Jan-1966, 50 y.o.   MRN:  [id number]  

## 2016-09-02 ENCOUNTER — Ambulatory Visit (INDEPENDENT_AMBULATORY_CARE_PROVIDER_SITE_OTHER): Payer: 59 | Admitting: Physical Therapy

## 2016-09-02 ENCOUNTER — Encounter: Payer: Self-pay | Admitting: Physical Therapy

## 2016-09-02 DIAGNOSIS — R29898 Other symptoms and signs involving the musculoskeletal system: Secondary | ICD-10-CM | POA: Diagnosis not present

## 2016-09-02 DIAGNOSIS — M542 Cervicalgia: Secondary | ICD-10-CM

## 2016-09-02 DIAGNOSIS — M436 Torticollis: Secondary | ICD-10-CM | POA: Diagnosis not present

## 2016-09-02 NOTE — Patient Instructions (Addendum)
Scapular Retraction (Standing)    With arms at sides, pinch shoulder blades together. Repeat __10__ times per set. Do __1__ sets per session. Do _5-6___ sessions per day.   Strengthening: Shoulder Shrug (Phase 1)    Shrug shoulders up, back and down. Repeat __10__ times per set. Do _1___ sets per session. Do __5-6__ sessions per day. Copyright  VHI. All rights reserved.

## 2016-09-02 NOTE — Therapy (Signed)
[place], [place], [address] Phone: [phone]   Fax:  [phone]  Physical Therapy Treatment  Patient Details  Name: Shannon [name] MRN: [id number] Date of Birth: [birth year] Referring Provider: Dr Dianah Field  Encounter Date: 09/02/2016      PT End of Session - 09/02/16 0942    Visit Number 6   Number of Visits 14   Date for PT Re-Evaluation 09/30/16   PT Start Time 0939   PT Stop Time 1028   PT Time Calculation (min) 49 min   Activity Tolerance Patient limited by pain      Past Medical History:  Diagnosis Date  . Anxiety   . Arthritis    back and hips   . Depression   . Diabetes mellitus without complication ([place])   . GERD (gastroesophageal reflux disease)   . History of hiatal hernia   . Hyperlipidemia   . Hypothyroidism   . PONV (postoperative nausea and vomiting)    slow to wake up   . Shortness of breath dyspnea    with exertion   . Sleep apnea    cpap -0 setting at 16   . Stress incontinence     Past Surgical History:  Procedure Laterality Date  . APPENDECTOMY    . BREATH TEK H PYLORI N/A 04/03/2015   Procedure: BREATH TEK H PYLORI;  Surgeon: Greer Pickerel, MD;  Location: [facility];  Service: General;  Laterality: N/A;  . CESAREAN SECTION    . 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   second interspace, left foot  . feet surgery    . LAPAROSCOPIC GASTRIC SLEEVE RESECTION WITH HIATAL HERNIA REPAIR N/A 08/13/2015   Procedure: LAPAROSCOPIC GASTRIC SLEEVE RESECTION WITH HIATAL HERNIA REPAIR;  Surgeon: Greer Pickerel, MD;  Location: [facility];  Service: General;  Laterality: N/A;  . laproscopy    . UPPER GI ENDOSCOPY  08/13/2015   Procedure: UPPER GI ENDOSCOPY;  Surgeon: Greer Pickerel, MD;  Location: [facility];  Service: General;;  . WISDOM TOOTH EXTRACTION      There were no vitals filed for this visit.       Subjective Assessment - 09/02/16 0940    Subjective Shannon [name] is very sore today, had to take pain meds last night because the pain was so bad.    Currently in Pain? Yes   Pain Score 6   10/10 last night   Pain Location Neck   Pain Orientation Right   Pain Descriptors / Indicators Dull;Aching   Pain Type Chronic pain   Pain Onset More than a month ago   Pain Frequency Constant   Aggravating Factors  not sure what flared her up this time.    Pain Relieving Factors heat, meds, tennis ball for pressure             [facility] PT Assessment - 09/02/16 0001      Assessment   Medical Diagnosis cervical pain   Referring Provider Dr Dianah Field   Onset Date/Surgical Date 07/28/16   Hand Dominance Right     Observation/Other Assessments   Focus on Therapeutic Outcomes (FOTO)  37% limited     AROM   Overall AROM  --  pain with all cervical motion   Cervical Flexion 30   Cervical Extension 45   Cervical - Right Side Bend 25   Cervical - Left Side Bend 25   Cervical -  Right Rotation 30  70 after manual work   Cervical - Left Rotation 50  72 after manual work                     [facility] Adult PT Treatment/Exercise - 09/02/16 0001      Self-Care   Self-Care Other Self-Care Comments   Other Self-Care Comments  discussed the importance of movement to loosen up muscles, the use of a home TENs unit and meditation.      Modalities   Modalities Electrical Stimulation;Moist Heat     Moist Heat Therapy   Number Minutes Moist Heat 15 Minutes   Moist Heat Location Cervical     Electrical Stimulation   Electrical Stimulation Location cervical   Electrical Stimulation Action IFC   Electrical Stimulation Parameters  to tolerance   Electrical Stimulation Goals Tone;Pain     Manual Therapy   Manual Therapy Soft tissue mobilization;Passive ROM;Manual Traction   Manual therapy comments supine   Soft tissue mobilization Rt SCM, bilat upper traps, periocciput, occipital base  release   Passive ROM cervical spine in all directions with gentle traction   Manual Traction cervical traction.                 PT Education - 09/02/16 0948    Education provided Yes   Education Details scap retrac and shoulder circles back   Person(s) Educated Patient   Methods Explanation;Demonstration;Handout   Comprehension Returned demonstration;Verbalized understanding             PT Long Term Goals - 09/02/16 1312      PT LONG TERM GOAL #1   Title I with advanced HEP ( 09/30/16)    Time 4   Period Weeks   Status On-going     PT LONG TERM GOAL #2   Title demo painfree cervical motion WFL, rotation =/> 60 degrees ( 09/30/16)    Time 4   Period Weeks   Status On-going     PT LONG TERM GOAL #3   Title demo Rt UE strength =/> Lt UE  ( 09/30/16)    Time 4   Period Weeks   Status On-going     PT LONG TERM GOAL #4   Title demo Rt grip strength within 5# of Lt hand. (09/30/16)    Time 4   Period Weeks   Status On-going     PT LONG TERM GOAL #5   Title improve FOTO =/> 35% limited ( 09/30/16)    Time 4   Period Weeks   Status On-going               Plan - 09/02/16 1014    Clinical Impression Statement Shannon [name] presented with a lot of guarding and pain. She had significant improvement in ROM after manual work to her neck.  Encouraged patient to try and keep the muscles in her neck /upper shoulders moving to decease pain and tightness.  Also recommended she try some meditation to help her relax. She hasn't met her goals and would benefit from more therapy.    Rehab Potential Excellent   PT Frequency 2x / week   PT Duration 4 weeks   PT Treatment/Interventions Moist Heat;Traction;Ultrasound;Therapeutic exercise;Dry needling;Taping;Manual techniques;Cryotherapy;Electrical Stimulation;Functional mobility training;Patient/family education   PT Next Visit Plan renewal written    Consulted and Agree with Plan of Care Patient      Patient will benefit  from skilled therapeutic intervention in order to improve the following  deficits and impairments:  Postural dysfunction, Decreased strength, Pain, Impaired UE functional use, Decreased range of motion  Visit Diagnosis: Stiffness of neck - Plan: PT plan of care cert/re-cert  Weakness of right arm - Plan: PT plan of care cert/re-cert  Pain, neck - Plan: PT plan of care cert/re-cert     Problem List Patient Active Problem List   Diagnosis Date Noted  . DDD (degenerative disc disease), cervical 07/31/2016  . Hiatal hernia 08/13/2015  . 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 Generalized anxiety disorder 03/16/2015  . BMI 38.0-38.9,adult 12/29/2014  . DDD (degenerative disc disease), lumbar 08/28/2014  . Hypothyroidism 08/15/2014  . Diabetes mellitus type II, controlled ([place]) 08/15/2014  . OSA on CPAP 03/31/2014  . Abnormal weight gain 03/31/2014  . Sleep apnea 02/05/2012  . Mood disorder ([place]) 02/05/2012  . Hyperlipidemia 11/22/2008    Jeral Pinch PT  09/02/2016, 1:15 PM  [facility] [place], [place], [address] Phone: [phone]   Fax:  [phone]  Name: Shannon [name] MRN: [id number] Date of Birth: 12/25/1965

## 2016-09-04 ENCOUNTER — Encounter: Payer: 59 | Admitting: Physical Therapy

## 2016-09-08 ENCOUNTER — Ambulatory Visit (INDEPENDENT_AMBULATORY_CARE_PROVIDER_SITE_OTHER): Payer: 59 | Admitting: Rehabilitative and Restorative Service Providers"

## 2016-09-08 DIAGNOSIS — R29898 Other symptoms and signs involving the musculoskeletal system: Secondary | ICD-10-CM

## 2016-09-08 DIAGNOSIS — M542 Cervicalgia: Secondary | ICD-10-CM | POA: Diagnosis not present

## 2016-09-08 DIAGNOSIS — M436 Torticollis: Secondary | ICD-10-CM | POA: Diagnosis not present

## 2016-09-08 NOTE — Patient Instructions (Signed)
Trigger Point Dry Needling  . What is Trigger Point Dry Needling (DN)? o DN is a physical therapy technique used to treat muscle pain and dysfunction. Specifically, DN helps deactivate muscle trigger points (muscle knots).  o A thin filiform needle is used to penetrate the skin and stimulate the underlying trigger point. The goal is for a local twitch response (LTR) to occur and for the trigger point to relax. No medication of any kind is injected during the procedure.   . What Does Trigger Point Dry Needling Feel Like?  o The procedure feels different for each individual patient. Some patients report that they do not actually feel the needle enter the skin and overall the process is not painful. Very mild bleeding may occur. However, many patients feel a deep cramping in the muscle in which the needle was inserted. This is the local twitch response.   Marland Kitchen How Will I feel after the treatment? o Soreness is normal, and the onset of soreness may not occur for a few hours. Typically this soreness does not last longer than two days.  o Bruising is uncommon, however; ice can be used to decrease any possible bruising.  o In rare cases feeling tired or nauseous after the treatment is normal. In addition, your symptoms may get worse before they get better, this period will typically not last longer than 24 hours.   . What Can I do After My Treatment? o Increase your hydration by drinking more water for the next 24 hours. o You may place ice or heat on the areas treated that have become sore, however, do not use heat on inflamed or bruised areas. Heat often brings more relief post needling. o You can continue your regular activities, but vigorous activity is not recommended initially after the treatment for 24 hours. o DN is best combined with other physical therapy such as strengthening, stretching, and other therapies.    Scapula Adduction With Pectoralis Stretch: Low - Standing   Shoulders at 45  hands even with shoulders, keeping weight through legs, shift weight forward until you feel pull or stretch through the front of your chest. Hold _30__ seconds. Do _3__ times, _2-4__ times per day.   Scapula Adduction With Pectoralis Stretch: Mid-Range - Standing   Shoulders at 90 elbows even with shoulders, keeping weight through legs, shift weight forward until you feel pull or strength through the front of your chest. Hold __30_ seconds. Do _3__ times, __2-4_ times per day.   Scapula Adduction With Pectoralis Stretch: High - Standing   Shoulders at 120 hands up high on the doorway, keeping weight on feet, shift weight forward until you feel pull or stretch through the front of your chest. Hold _30__ seconds. Do _3__ times, _2-3__ times per day.

## 2016-09-08 NOTE — Therapy (Signed)
[place], [place], [address] Phone: [phone]   Fax:  [phone]  Physical Therapy Treatment  Patient Details  Name: Shannon [name] MRN: [id number] Date of Birth: 09-Feb-1966 Referring Provider: Dr Dianah Field  Encounter Date: 09/08/2016      PT End of Session - 09/08/16 0716    Visit Number 7   Number of Visits 14   Date for PT Re-Evaluation 09/30/16   PT Start Time 0714   PT Stop Time 0813   PT Time Calculation (min) 59 min   Activity Tolerance Patient tolerated treatment well      Past Medical History:  Diagnosis Date  . Anxiety   . Arthritis    back and hips   . Depression   . Diabetes mellitus without complication ([place])   . GERD (gastroesophageal reflux disease)   . History of hiatal hernia   . Hyperlipidemia   . Hypothyroidism   . PONV (postoperative nausea and vomiting)    slow to wake up   . Shortness of breath dyspnea    with exertion   . Sleep apnea    cpap -0 setting at 16   . Stress incontinence     Past Surgical History:  Procedure Laterality Date  . APPENDECTOMY    . BREATH TEK H PYLORI N/A 04/03/2015   Procedure: BREATH TEK H PYLORI;  Surgeon: Greer Pickerel, MD;  Location: [facility];  Service: General;  Laterality: N/A;  . CESAREAN SECTION    . 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   second interspace, left foot  . feet surgery    . LAPAROSCOPIC GASTRIC SLEEVE RESECTION WITH HIATAL HERNIA REPAIR N/A 08/13/2015   Procedure: LAPAROSCOPIC GASTRIC SLEEVE RESECTION WITH HIATAL HERNIA REPAIR;  Surgeon: Greer Pickerel, MD;  [facility];  Service: General;  Laterality: N/A;  . laproscopy    . UPPER GI ENDOSCOPY  08/13/2015   Procedure: UPPER GI ENDOSCOPY;  Surgeon: Greer Pickerel, MD;  Location: [facility];  Service: General;;  . WISDOM TOOTH EXTRACTION      There were no vitals filed for this  visit.      Subjective Assessment - 09/08/16 0717    Subjective Shannon [name] reports that she has more pain and soreness today. Not sure why she is having more symptoms this am. Could be the way she slept. Working on her exercises at home. Was better for a couple of days but started "acting up" again yesterday.    Currently in Pain? Yes   Pain Score 6    Pain Location Neck   Pain Orientation Right   Pain Descriptors / Indicators Aching;Dull   Pain Type Chronic pain   Pain Onset More than a month ago   Pain Frequency Constant                         [facility] Adult PT Treatment/Exercise - 09/08/16 0001      Neck Exercises: Machines for Strengthening   UBE (Upper Arm Bike) L2; 4 min alt fwd/back - standing     Neck Exercises: Standing   Other Standing Exercises scap squeeze around pool noodle x 5 sec hold x 10 reps.  Then passive stretch into scap retraction x 20 sec x 3 reps      Neck Exercises: Supine   Neck Retraction 5 reps;10 secs     Shoulder  Exercises: Stretch   Other Shoulder Stretches Doorway stretch x3 postions 30 sec x 3 reps each     Modalities   Modalities Electrical Stimulation;Moist Heat     Moist Heat Therapy   Number Minutes Moist Heat 20 Minutes   Moist Heat Location Cervical     Electrical Stimulation   Electrical Stimulation Location cervical   Electrical Stimulation Action IFC   Electrical Stimulation Parameters to tolerance   Electrical Stimulation Goals Tone;Pain     Manual Therapy   Manual Therapy Soft tissue mobilization;Passive ROM;Manual Traction   Manual therapy comments prone   Soft tissue mobilization bilat upper traps, periocciput, occipital base release   Myofascial Release thoracic mobs lat pt prone    Passive ROM cervical spine in all directions with gentle traction   Manual Traction cervical traction.           Trigger Point Dry Needling - 09/08/16 0758    Consent Given? Yes   Education Handout Provided Yes   Muscles  Treated Upper Body Upper trapezius;Levator scapulae  bilat Rt > Lt    Upper Trapezius Response Twitch reponse elicited;Palpable increased muscle length   Levator Scapulae Response Twitch response elicited;Palpable increased muscle length                   PT Long Term Goals - 09/08/16 0801      PT LONG TERM GOAL #1   Title I with advanced HEP ( 09/30/16)    Time 4   Period Weeks   Status On-going     PT LONG TERM GOAL #2   Title demo painfree cervical motion WFL, rotation =/> 60 degrees ( 09/30/16)    Time 4   Period Weeks   Status On-going     PT LONG TERM GOAL #3   Title demo Rt UE strength =/> Lt UE  ( 09/30/16)    Time 4   Period Weeks   Status On-going     PT LONG TERM GOAL #4   Title demo Rt grip strength within 5# of Lt hand. (09/30/16)    Time 4   Period Weeks   Status On-going     PT LONG TERM GOAL #5   Title improve FOTO =/> 35% limited ( 09/30/16)    Time 4   Period Weeks   Status On-going               Plan - 09/08/16 0758    Clinical Impression Statement Continued muscular tightness noted through Rt > Lt upper quarter. poor posture and alignment with head forward posture and increased thoracic kyphosis. Good response to TDN with release of muscular tightness noted. Will benefit from continued TDN increasing the muscles treated as patient is more comfortable with TDN.    Rehab Potential Excellent   PT Frequency 2x / week   PT Duration 4 weeks   PT Treatment/Interventions Moist Heat;Traction;Ultrasound;Therapeutic exercise;Dry needling;Taping;Manual techniques;Cryotherapy;Electrical Stimulation;Functional mobility training;Patient/family education   PT Next Visit Plan assess response to TDN; continue with postural correction; HEP    Consulted and Agree with Plan of Care Patient      Patient will benefit from skilled therapeutic intervention in order to improve the following deficits and impairments:  Postural dysfunction, Decreased  strength, Pain, Impaired UE functional use, Decreased range of motion  Visit Diagnosis: Stiffness of neck  Weakness of right arm  Pain, neck     Problem List Patient Active Problem List   Diagnosis Date Noted  .  DDD (degenerative disc disease), cervical 07/31/2016  . Hiatal hernia 08/13/2015  . 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 Generalized anxiety disorder 03/16/2015  . BMI 38.0-38.9,adult 12/29/2014  . DDD (degenerative disc disease), lumbar 08/28/2014  . Hypothyroidism 08/15/2014  . Diabetes mellitus type II, controlled ([place]) 08/15/2014  . OSA on CPAP 03/31/2014  . Abnormal weight gain 03/31/2014  . Sleep apnea 02/05/2012  . Mood disorder ([place]) 02/05/2012  . Hyperlipidemia 11/22/2008    Shannon [name] Nilda Simmer  PT, MPH  09/08/2016, 8:02 AM  [facility] [place], [place], [address] Phone: [phone]   Fax:  [phone]  Name: Shannon [name] MRN: [id number] Date of Birth: [birth year]

## 2016-09-15 ENCOUNTER — Encounter: Payer: Self-pay | Admitting: Physical Therapy

## 2016-09-17 ENCOUNTER — Ambulatory Visit (INDEPENDENT_AMBULATORY_CARE_PROVIDER_SITE_OTHER): Payer: 59 | Admitting: Physical Therapy

## 2016-09-17 ENCOUNTER — Encounter: Payer: Self-pay | Admitting: Physical Therapy

## 2016-09-17 DIAGNOSIS — R29898 Other symptoms and signs involving the musculoskeletal system: Secondary | ICD-10-CM

## 2016-09-17 DIAGNOSIS — M436 Torticollis: Secondary | ICD-10-CM | POA: Diagnosis not present

## 2016-09-17 DIAGNOSIS — M542 Cervicalgia: Secondary | ICD-10-CM

## 2016-09-17 NOTE — Therapy (Signed)
[place], [place], [address] Phone: [phone]   Fax:  [phone]  Physical Therapy Treatment  Patient Details  Name: Shannon [name] MRN: [id number] Date of Birth: 05-23-1966 Referring Provider: Dr Dianah Field  Encounter Date: 09/17/2016      PT End of Session - 09/17/16 0929    Visit Number 8   Number of Visits 14   Date for PT Re-Evaluation 09/30/16   PT Start Time 0929   PT Stop Time 1030   PT Time Calculation (min) 61 min   Activity Tolerance Patient tolerated treatment well      Past Medical History:  Diagnosis Date  . Anxiety   . Arthritis    back and hips   . Depression   . Diabetes mellitus without complication ([place])   . GERD (gastroesophageal reflux disease)   . History of hiatal hernia   . Hyperlipidemia   . Hypothyroidism   . PONV (postoperative nausea and vomiting)    slow to wake up   . Shortness of breath dyspnea    with exertion   . Sleep apnea    cpap -0 setting at 16   . Stress incontinence     Past Surgical History:  Procedure Laterality Date  . APPENDECTOMY    . BREATH TEK H PYLORI N/A 04/03/2015   Procedure: BREATH TEK H PYLORI;  Surgeon: Greer Pickerel, MD;  Location: [facility];  Service: General;  Laterality: N/A;  . CESAREAN SECTION    . 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   second interspace, left foot  . feet surgery    . LAPAROSCOPIC GASTRIC SLEEVE RESECTION WITH HIATAL HERNIA REPAIR N/A 08/13/2015   Procedure: LAPAROSCOPIC GASTRIC SLEEVE RESECTION WITH HIATAL HERNIA REPAIR;  Surgeon: Greer Pickerel, MD;  [facility];  Service: General;  Laterality: N/A;  . laproscopy    . UPPER GI ENDOSCOPY  08/13/2015   Procedure: UPPER GI ENDOSCOPY;  Surgeon: Greer Pickerel, MD;  Location[facility];  Service: General;;  . WISDOM TOOTH EXTRACTION      There were no vitals filed for this  visit.      Subjective Assessment - 09/17/16 0930    Subjective Roosevelt reports a little bit of pain Rt upper shoulder/neck.  Neck feels weaker today.  Was sore for 2 days after TDN last visit.    Patient Stated Goals get help for the weakness.    Currently in Pain? Yes   Pain Score 4    Pain Location Neck   Pain Orientation Right   Pain Descriptors / Indicators Dull   Pain Type Chronic pain   Pain Onset More than a month ago   Pain Frequency Constant   Aggravating Factors  work   Pain Relieving Factors heat, meds, TPR            [facility] PT Assessment - 09/17/16 0001      Assessment   Medical Diagnosis cervical pain   Referring Provider Dr Dianah Field   Onset Date/Surgical Date 07/28/16   Hand Dominance Right     AROM   Cervical Flexion 25  pain in posterior neck   Cervical Extension 45   Cervical - Right Side Bend 33   Cervical - Left Side Bend 48   Cervical - Right Rotation 65   Cervical - Left Rotation 64     Strength   Right/Left Shoulder --  bilat WNL slight weakness Lt flex/abduct 5-/5   Right/Left Elbow --  bilat WNL   Right/Left hand Left;Right   Right Hand Grip (lbs) 51   Left Hand Grip (lbs) 65                     [facility] Adult PT Treatment/Exercise - 09/17/16 0001      Neck Exercises: Machines for Strengthening   UBE (Upper Arm Bike) L2; 5 min alt fwd/back - standing     Modalities   Modalities Electrical Stimulation;Moist Heat     Moist Heat Therapy   Number Minutes Moist Heat 20 Minutes   Moist Heat Location Cervical     Electrical Stimulation   Electrical Stimulation Location cervical   Electrical Stimulation Action IFC   Electrical Stimulation Parameters to tolerance   Electrical Stimulation Goals Tone;Pain     Manual Therapy   Manual Therapy Soft tissue mobilization;Joint mobilization   Joint Mobilization cervical PA mobs grade III   Soft tissue mobilization bilat upper traps, paracervicals.           Trigger Point Dry  Needling - 09/17/16 0934    Consent Given? Yes   Education Handout Provided No   Muscles Treated Upper Body Suboccipitals muscle group;Longissimus  cervical area   Upper Trapezius Response Twitch reponse elicited;Palpable increased muscle length  bilat with stim.    SubOccipitals Response Palpable increased muscle length;Twitch response elicited  bilat   Longissimus Response Palpable increased muscle length;Twitch response elicited  bilat with stim                   PT Long Term Goals - 09/17/16 0932      PT LONG TERM GOAL #1   Title I with advanced HEP ( 09/30/16)    Status On-going     PT LONG TERM GOAL #2   Title demo painfree cervical motion WFL, rotation =/> 60 degrees ( 09/30/16)    Status Partially Met  has the ROM however some discomfort at end range     PT LONG TERM GOAL #3   Title demo Rt UE strength =/> Lt UE  ( 09/30/16)    Status Achieved     PT LONG TERM GOAL #4   Title demo Rt grip strength within 5# of Lt hand. (09/30/16)    Status On-going     PT LONG TERM GOAL #5   Title improve FOTO =/> 35% limited ( 09/30/16)    Status On-going               Plan - 09/17/16 0950    Clinical Impression Statement Makynna had good response to TDN and manual work last session, he had improved cervical ROM and shoulder strength.  She has met some of her goals and progressing to the others.  As cervical muscles continue to release she should have even more ROM.  Rt grip is pretty significantly weaker than Lt . She is using a ball at home.    Rehab Potential Excellent   PT Frequency 2x / week   PT Duration 4 weeks   PT Treatment/Interventions Moist Heat;Traction;Ultrasound;Therapeutic exercise;Dry needling;Taping;Manual techniques;Cryotherapy;Electrical Stimulation;Functional mobility training;Patient/family education   PT Next Visit Plan assess response to TDN; continue with postural correction; HEP    Consulted and Agree with Plan of Care Patient       Patient will benefit from skilled therapeutic intervention in order to improve the following deficits and impairments:  Postural dysfunction, Decreased strength,  Pain, Impaired UE functional use, Decreased range of motion  Visit Diagnosis: Stiffness of neck  Weakness of right arm  Pain, neck     Problem List Patient Active Problem List   Diagnosis Date Noted  . DDD (degenerative disc disease), cervical 07/31/2016  . Hiatal hernia 08/13/2015  . 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 Generalized anxiety disorder 03/16/2015  . BMI 38.0-38.9,adult 12/29/2014  . DDD (degenerative disc disease), lumbar 08/28/2014  . Hypothyroidism 08/15/2014  . Diabetes mellitus type II, controlled ([place]) 08/15/2014  . OSA on CPAP 03/31/2014  . Abnormal weight gain 03/31/2014  . Sleep apnea 02/05/2012  . Mood disorder ([place]) 02/05/2012  . Hyperlipidemia 11/22/2008    Manuela Schwartz shaver PT 09/17/2016, 10:14 AM  [facility] [place], [place], [address] Phone: [phone]   Fax:  [phone]  Name: DAVONNE BABY MRN: [id number] Date of Birth: Nov 18, 1965

## 2016-09-23 ENCOUNTER — Ambulatory Visit (INDEPENDENT_AMBULATORY_CARE_PROVIDER_SITE_OTHER): Payer: 59 | Admitting: Rehabilitative and Restorative Service Providers"

## 2016-09-23 ENCOUNTER — Encounter: Payer: Self-pay | Admitting: Rehabilitative and Restorative Service Providers"

## 2016-09-23 DIAGNOSIS — M542 Cervicalgia: Secondary | ICD-10-CM

## 2016-09-23 DIAGNOSIS — M436 Torticollis: Secondary | ICD-10-CM | POA: Diagnosis not present

## 2016-09-23 DIAGNOSIS — R29898 Other symptoms and signs involving the musculoskeletal system: Secondary | ICD-10-CM

## 2016-09-23 NOTE — Therapy (Addendum)
[place], [place], [address] Phone: [phone]   Fax:  [phone]  Physical Therapy Treatment  Patient Details  Name: Shannon [name] MRN: [id number] Date of Birth: 15-Apr-1966 Referring Provider: Dr Dianah Field  Encounter Date: 09/23/2016      PT End of Session - 09/23/16 1145    Visit Number 9   Number of Visits 14   Date for PT Re-Evaluation 09/30/16   PT Start Time 1104   PT Stop Time 1158   PT Time Calculation (min) 54 min   Activity Tolerance Patient tolerated treatment well      Past Medical History:  Diagnosis Date  . Anxiety   . Arthritis    back and hips   . Depression   . Diabetes mellitus without complication ([place])   . GERD (gastroesophageal reflux disease)   . History of hiatal hernia   . Hyperlipidemia   . Hypothyroidism   . PONV (postoperative nausea and vomiting)    slow to wake up   . Shortness of breath dyspnea    with exertion   . Sleep apnea    cpap -0 setting at 14   . Stress incontinence     Past Surgical History:  Procedure Laterality Date  . APPENDECTOMY    . BREATH TEK H PYLORI N/A 04/03/2015   Procedure: BREATH TEK H PYLORI;  Surgeon: Greer Pickerel, MD;  Location: [facility];  Service: General;  Laterality: N/A;  . CESAREAN SECTION    . 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   second interspace, left foot  . feet surgery    . LAPAROSCOPIC GASTRIC SLEEVE RESECTION WITH HIATAL HERNIA REPAIR N/A 08/13/2015   Procedure: LAPAROSCOPIC GASTRIC SLEEVE RESECTION WITH HIATAL HERNIA REPAIR;  Surgeon: Greer Pickerel, MD;  Location: [facility];  Service: General;  Laterality: N/A;  . laproscopy    . UPPER GI ENDOSCOPY  08/13/2015   Procedure: UPPER GI ENDOSCOPY;  Surgeon: Greer Pickerel, MD;  Location: [facility];  Service: General;;  . WISDOM TOOTH EXTRACTION      There were no vitals filed for this  visit.      Subjective Assessment - 09/23/16 1106    Subjective TDN seems to help. Has a spot iin the Rt shoulder area that is bothering her today. She tihinks that is from work.   Currently in Pain? Yes   Pain Score 4    Pain Location Neck   Pain Orientation Right   Pain Descriptors / Indicators Dull;Aching   Pain Type Chronic pain   Pain Onset More than a month ago   Pain Frequency Constant                         [facility] Adult PT Treatment/Exercise - 09/23/16 0001      Neck Exercises: Machines for Strengthening   UBE (Upper Arm Bike) L2; 5 min alt fwd/back - standing     Neck Exercises: Standing   Other Standing Exercises scap squeeze around pool noodle x 5 sec hold x 10 reps.  Then passive stretch into scap retraction x 20 sec x 3 reps      Neck Exercises: Prone   Rows Limitations rows with axial extension x 10 some discomfort      Shoulder Exercises: Stretch   Other Shoulder Stretches Doorway stretch x3 postions 30 sec x 3 reps each  Modalities   Modalities Electrical Stimulation;Moist Heat     Moist Heat Therapy   Number Minutes Moist Heat 15 Minutes   Moist Heat Location Cervical  thoracic     Electrical Stimulation   Electrical Stimulation Location cervical/upper thoracic Rt    Electrical Stimulation Action IFC   Electrical Stimulation Parameters to tolerance   Electrical Stimulation Goals Tone;Pain     Manual Therapy   Manual Therapy Soft tissue mobilization;Joint mobilization   Manual therapy comments prone   Soft tissue mobilization bilat upper traps, paracervicals.    Myofascial Release thoracic mobs lat pt prone           Trigger Point Dry Needling - 09/23/16 1144    Consent Given? Yes   Muscles Treated Upper Body Suboccipitals muscle group  Rt    Upper Trapezius Response Twitch reponse elicited;Palpable increased muscle length   SubOccipitals Response Palpable increased muscle length   Levator Scapulae Response Palpable  increased muscle length   Longissimus Response Palpable increased muscle length                   PT Long Term Goals - 09/23/16 1147      PT LONG TERM GOAL #1   Title I with advanced HEP ( 09/30/16)    Time 4   Period Weeks   Status On-going     PT LONG TERM GOAL #2   Title demo painfree cervical motion WFL, rotation =/> 60 degrees ( 09/30/16)    Time 4   Status Partially Met     PT LONG TERM GOAL #3   Title demo Rt UE strength =/> Lt UE  ( 09/30/16)    Time 4   Period Weeks   Status Achieved     PT LONG TERM GOAL #4   Title demo Rt grip strength within 5# of Lt hand. (09/30/16)    Time 4   Period Weeks   Status On-going     PT LONG TERM GOAL #5   Title improve FOTO =/> 35% limited ( 09/30/16)    Time 4   Period Weeks   Status On-going               Plan - 09/23/16 1145    Clinical Impression Statement Good response to TDN and manual work. Continues to have flare up of symptoms with work activities. Not sure of consistency with HEP. needs continued work on posture and alignment to help prevent recurrent symptoms.    Rehab Potential Excellent   PT Frequency 2x / week   PT Duration 4 weeks   PT Treatment/Interventions Moist Heat;Traction;Ultrasound;Therapeutic exercise;Dry needling;Taping;Manual techniques;Cryotherapy;Electrical Stimulation;Functional mobility training;Patient/family education   PT Next Visit Plan assess response to TDN; continue with postural correction; HEP POC ends at 09/30/16 - reassess as indicated for additional treatments    Consulted and Agree with Plan of Care Patient      Patient will benefit from skilled therapeutic intervention in order to improve the following deficits and impairments:  Postural dysfunction, Decreased strength, Pain, Impaired UE functional use, Decreased range of motion  Visit Diagnosis: Stiffness of neck  Weakness of right arm  Pain, neck     Problem List Patient Active Problem List    Diagnosis Date Noted  . DDD (degenerative disc disease), cervical 07/31/2016  . Hiatal hernia 08/13/2015  . 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 Generalized anxiety disorder 03/16/2015  . BMI 38.0-38.9,adult 12/29/2014  . DDD (degenerative disc disease), lumbar 08/28/2014  .  Hypothyroidism 08/15/2014  . Diabetes mellitus type II, controlled ([place]) 08/15/2014  . OSA on CPAP 03/31/2014  . Abnormal weight gain 03/31/2014  . Sleep apnea 02/05/2012  . Mood disorder ([place]) 02/05/2012  . Hyperlipidemia 11/22/2008    Andersyn Fragoso Nilda Simmer PT, MPH  09/23/2016, 11:49 AM  [facility] [place], [place], [address] Phone: [phone]   Fax:  [phone]  Name: Shannon [name] MRN: [id number] Date of Birth: Sep 09, 1966  PHYSICAL THERAPY DISCHARGE SUMMARY  Visits from Start of Care: 9  Current functional level related to goals / functional outcomes: unknown   Remaining deficits: unknown   Education / Equipment: HEP Plan:                                                    Patient goals were partially met. Patient is being discharged due to not returning since the last visit.  ?????   Jeral Pinch, PT 10/24/16 8:38 AM

## 2016-10-02 DIAGNOSIS — G4733 Obstructive sleep apnea (adult) (pediatric): Secondary | ICD-10-CM | POA: Diagnosis not present

## 2016-10-03 ENCOUNTER — Encounter: Payer: Self-pay | Admitting: Rehabilitative and Restorative Service Providers"

## 2016-10-07 ENCOUNTER — Telehealth (HOSPITAL_COMMUNITY): Payer: Self-pay | Admitting: Psychiatry

## 2016-10-07 MED ORDER — ALPRAZOLAM 0.5 MG PO TABS: 0.5000 mg | ORAL_TABLET | Freq: Every evening | ORAL | 0 refills | Status: DC | PRN

## 2016-10-07 MED FILL — CITALOPRAM HBR 40 MG TABLET: 40 | 30 days supply | Qty: 30 | Fill #0

## 2016-10-07 NOTE — Telephone Encounter (Signed)
Medication refill- pt called the office requesting a refill for Xanax 0.5mg . Per Dr. De Nurse, refill is authorized for Xanax 0.5mg , #30. Prescription printed for pick up. Called and informed pt of refill status. Pt verbalizes understanding.  Pt's f/u appt is 1/25.

## 2016-10-07 NOTE — Telephone Encounter (Signed)
Pt needs refill on xanax. °

## 2016-10-09 MED FILL — lamoTRIgine 200 MG TABS: 200 | 30 days supply | Qty: 30 | Fill #1

## 2016-10-16 MED FILL — ALPRAZolam 0.5 MG TABS: 0.5 | 30 days supply | Qty: 30 | Fill #0

## 2016-10-23 ENCOUNTER — Encounter: Payer: Self-pay | Admitting: Obstetrics & Gynecology

## 2016-10-23 ENCOUNTER — Ambulatory Visit (INDEPENDENT_AMBULATORY_CARE_PROVIDER_SITE_OTHER): Payer: 59 | Admitting: Obstetrics & Gynecology

## 2016-10-23 VITALS — BP 121/77 | HR 87 | Ht 65.0 in | Wt 188.0 lb

## 2016-10-23 DIAGNOSIS — N938 Other specified abnormal uterine and vaginal bleeding: Secondary | ICD-10-CM

## 2016-10-23 DIAGNOSIS — Z8639 Personal history of other endocrine, nutritional and metabolic disease: Secondary | ICD-10-CM

## 2016-10-23 DIAGNOSIS — Z113 Encounter for screening for infections with a predominantly sexual mode of transmission: Secondary | ICD-10-CM

## 2016-10-23 DIAGNOSIS — Z0000 Encounter for general adult medical examination without abnormal findings: Secondary | ICD-10-CM

## 2016-10-23 NOTE — Addendum Note (Signed)
Addended by: Asencion Islam on: 10/23/2016 02:32 PM   Modules accepted: Orders

## 2016-10-23 NOTE — Progress Notes (Signed)
   Subjective:    Patient ID: Shannon [name], female    DOB: 1965-12-02, 51 y.o.   MRN: [id number]  HPI 50 DWP1 (59 yo son) here today to discuss irregular periods. Periods monthly "to the T" and lasted 5-7 days for years until 8/17. At that time periods became longer and more painful "burning". In December 2017 she spotted for 15 days.   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 Her mom went through menopause at 71 yo She had hot flashes prior to her gastric sleeve but not since then, although she does have flashes of just her face.     Objective:   Physical Exam  WNWHWFNAD Breathing, conversing, and ambulating normally      Assessment & Plan:  Preventative care- RTC when not with her period for pap smear Changing periods- check TFTs, gyn u/s, CBC, cervical cultures

## 2016-10-24 LAB — CBC
HEMATOCRIT: 37.2 % (ref 34.0–46.6)
HEMOGLOBIN: 12.4 g/dL (ref 11.1–15.9)
MCH: 28.2 pg (ref 26.6–33.0)
MCHC: 33.3 g/dL (ref 31.5–35.7)
MCV: 85 fL (ref 79–97)
Platelets: 535 10*3/uL — ABNORMAL HIGH (ref 150–379)
RBC: 4.39 x10E6/uL (ref 3.77–5.28)
RDW: 13.9 % (ref 12.3–15.4)
WBC: 7.6 10*3/uL (ref 3.4–10.8)

## 2016-10-24 LAB — T4, FREE: Free T4: 0.76 ng/dL — ABNORMAL LOW (ref 0.82–1.77)

## 2016-10-24 LAB — T3, FREE: T3, Free: 2.4 pg/mL (ref 2.0–4.4)

## 2016-10-24 LAB — HIV ANTIBODY (ROUTINE TESTING W REFLEX): HIV Screen 4th Generation wRfx: NONREACTIVE

## 2016-10-24 LAB — TSH: TSH: 2.5 u[IU]/mL (ref 0.450–4.500)

## 2016-10-27 LAB — GC/CHLAMYDIA PROBE AMP (~~LOC~~) NOT AT ARMC
Chlamydia: NEGATIVE
Neisseria Gonorrhea: NEGATIVE

## 2016-10-30 ENCOUNTER — Encounter: Payer: Self-pay | Admitting: Obstetrics & Gynecology

## 2016-10-30 ENCOUNTER — Ambulatory Visit (INDEPENDENT_AMBULATORY_CARE_PROVIDER_SITE_OTHER): Payer: 59

## 2016-10-30 DIAGNOSIS — D250 Submucous leiomyoma of uterus: Secondary | ICD-10-CM

## 2016-10-30 DIAGNOSIS — N938 Other specified abnormal uterine and vaginal bleeding: Secondary | ICD-10-CM

## 2016-10-30 DIAGNOSIS — D219 Benign neoplasm of connective and other soft tissue, unspecified: Secondary | ICD-10-CM | POA: Insufficient documentation

## 2016-10-30 DIAGNOSIS — D259 Leiomyoma of uterus, unspecified: Secondary | ICD-10-CM | POA: Diagnosis not present

## 2016-11-04 ENCOUNTER — Ambulatory Visit (INDEPENDENT_AMBULATORY_CARE_PROVIDER_SITE_OTHER): Payer: 59 | Admitting: Obstetrics & Gynecology

## 2016-11-04 ENCOUNTER — Encounter: Payer: Self-pay | Admitting: Obstetrics & Gynecology

## 2016-11-04 VITALS — BP 116/74 | HR 82 | Ht 65.0 in | Wt 190.0 lb

## 2016-11-04 DIAGNOSIS — Z01419 Encounter for gynecological examination (general) (routine) without abnormal findings: Secondary | ICD-10-CM | POA: Diagnosis not present

## 2016-11-04 DIAGNOSIS — Z0000 Encounter for general adult medical examination without abnormal findings: Secondary | ICD-10-CM

## 2016-11-04 DIAGNOSIS — E119 Type 2 diabetes mellitus without complications: Secondary | ICD-10-CM

## 2016-11-04 MED ORDER — LEVONORGEST-ETH ESTRAD 91-DAY 0.15-0.03 &0.01 MG PO TABS: 1.0000 | ORAL_TABLET | Freq: Every day | ORAL | 4 refills | Status: DC

## 2016-11-04 NOTE — Progress Notes (Signed)
   Subjective:    Patient ID: Shannon [name], female    DOB: Jan 08, 1966, 51 y.o.   MRN: [id number]  HPI 51 yo SW lady here to discuss her u/s results (done for evaluation of change in her menstrual cycle). She is also here for a pap smear.   Review of Systems FH-+ for ovarian 1st cousin    Objective:   Physical Exam WNWHWFNAD She declines a breast exam Breathing, conversing, and ambulating normally 12-14 week size uterus Normal vulva, vagina, and cervix     Assessment & Plan:  Fibroids- offered options- watchful waiting, OCPs, Mirena IUD She opts for extended cycle OCPs, camrese prescribed Preventative care- pap today with cotesting, mammo ordered

## 2016-11-11 LAB — CYTOLOGY - PAP
DIAGNOSIS: NEGATIVE
HPV: DETECTED — AB

## 2016-11-13 ENCOUNTER — Ambulatory Visit (HOSPITAL_COMMUNITY): Payer: Self-pay | Admitting: Psychiatry

## 2016-11-13 ENCOUNTER — Ambulatory Visit (INDEPENDENT_AMBULATORY_CARE_PROVIDER_SITE_OTHER): Payer: 59 | Admitting: Psychiatry

## 2016-11-13 ENCOUNTER — Encounter (HOSPITAL_COMMUNITY): Payer: Self-pay | Admitting: Psychiatry

## 2016-11-13 ENCOUNTER — Encounter: Payer: Self-pay | Admitting: Obstetrics & Gynecology

## 2016-11-13 DIAGNOSIS — Z91018 Allergy to other foods: Secondary | ICD-10-CM

## 2016-11-13 DIAGNOSIS — F411 Generalized anxiety disorder: Secondary | ICD-10-CM

## 2016-11-13 DIAGNOSIS — Z818 Family history of other mental and behavioral disorders: Secondary | ICD-10-CM

## 2016-11-13 DIAGNOSIS — F0630 Mood disorder due to known physiological condition, unspecified: Secondary | ICD-10-CM

## 2016-11-13 DIAGNOSIS — Z811 Family history of alcohol abuse and dependence: Secondary | ICD-10-CM

## 2016-11-13 DIAGNOSIS — Z79899 Other long term (current) drug therapy: Secondary | ICD-10-CM

## 2016-11-13 DIAGNOSIS — B977 Papillomavirus as the cause of diseases classified elsewhere: Secondary | ICD-10-CM | POA: Insufficient documentation

## 2016-11-13 DIAGNOSIS — Z833 Family history of diabetes mellitus: Secondary | ICD-10-CM

## 2016-11-13 DIAGNOSIS — Z8489 Family history of other specified conditions: Secondary | ICD-10-CM

## 2016-11-13 DIAGNOSIS — Z888 Allergy status to other drugs, medicaments and biological substances status: Secondary | ICD-10-CM

## 2016-11-13 DIAGNOSIS — F331 Major depressive disorder, recurrent, moderate: Secondary | ICD-10-CM

## 2016-11-13 DIAGNOSIS — F410 Panic disorder [episodic paroxysmal anxiety] without agoraphobia: Secondary | ICD-10-CM

## 2016-11-13 MED ORDER — CITALOPRAM HYDROBROMIDE 40 MG PO TABS: 40.0000 mg | ORAL_TABLET | Freq: Every day | ORAL | 1 refills | Status: DC

## 2016-11-13 MED ORDER — LAMOTRIGINE 200 MG PO TABS: 200.0000 mg | ORAL_TABLET | Freq: Every day | ORAL | 1 refills | Status: DC

## 2016-11-13 MED ORDER — ALPRAZOLAM 0.5 MG PO TABS: 0.5000 mg | ORAL_TABLET | Freq: Every evening | ORAL | 0 refills | Status: DC | PRN

## 2016-11-13 NOTE — Progress Notes (Signed)
Patient ID: Shannon [name], female   DOB: 05/08/1966, 51 y.o.   MRN: [id number]  [facility] Follow up visit  Shannon [name] [id number] 51 y.o.  11/13/2016 9:04 AM  Chief Complaint:  Depression follow up  History of Present Illness:   Patient Presents for follow up and medication management for Major depression and Generalized anxiety disorder.  Patient is working night shift she came in somewhat late depression wise she is stable. Mood is balanced on Lamictal rash. Bariatric surgery was done in October weight still fluctuates some. Anxiety; infrequent panic attacks. Aggravating factor was remote divorce  Modifying factors she likes to work in crafts she has a 31 year old son.    Severity of depression. 7 out of 10. 10 being no depression.      Past Psychiatric History/Hospitalization(s) Manged for depression and mood symptoms for more then 20 years.  Prior Suicide Attempts: No  Medical History; Past Medical History:  Diagnosis Date  . Anxiety   . Arthritis    back and hips   . Depression   . Diabetes mellitus without complication ([place])   . GERD (gastroesophageal reflux disease)   . History of hiatal hernia   . Hyperlipidemia   . Hypothyroidism   . PONV (postoperative nausea and vomiting)    slow to wake up   . Shortness of breath dyspnea    with exertion   . Sleep apnea    cpap -0 setting at 16   . Stress incontinence     Allergies: Allergies  Allergen Reactions  . Food Swelling    Big Red Gum makes her tongue swell  . Latuda [Lurasidone Hcl] Other (See Comments)    Severe aggitation  . Lipitor [Atorvastatin] Other (See Comments)    Muscle aches  . Belviq [Lorcaserin Hcl]     Increased appetitie  . Contrave [Naltrexone-Bupropion Hcl Er] Other (See Comments)    Sleepy.    Medications: Outpatient Encounter Prescriptions as of 11/13/2016  Medication Sig  . ALPRAZolam (XANAX) 0.5 MG tablet Take 1 tablet (0.5 mg total) by mouth at bedtime  as needed for anxiety.  Marland Kitchen BIOTIN PO Take 1 tablet by mouth daily.  Marland Kitchen CALCIUM PO Take by mouth.  . citalopram (CELEXA) 40 MG tablet Take 1 tablet (40 mg total) by mouth daily. Take one tablet of 40mg  qd. Brand name medically necessary.  . lamoTRIgine (LAMICTAL) 200 MG tablet Take 1 tablet (200 mg total) by mouth daily.  . Levonorgestrel-Ethinyl Estradiol (AMETHIA,CAMRESE) 0.15-0.03 &0.01 MG tablet Take 1 tablet by mouth daily.  . Multiple Vitamin (MULTIVITAMIN) tablet Take 1 tablet by mouth daily.  . vitamin B-12 (CYANOCOBALAMIN) 1000 MCG tablet Take 1,000 mcg by mouth daily.  . [DISCONTINUED] ALPRAZolam (XANAX) 0.5 MG tablet Take 1 tablet (0.5 mg total) by mouth at bedtime as needed for anxiety.  . [DISCONTINUED] citalopram (CELEXA) 40 MG tablet Take 1 tablet (40 mg total) by mouth daily. Take one tablet of 40mg  qd. Brand name medically necessary.  . [DISCONTINUED] lamoTRIgine (LAMICTAL) 200 MG tablet Take 1 tablet (200 mg total) by mouth daily.   No facility-administered encounter medications on file as of 11/13/2016.      Family History; Family History  Problem Relation Age of Onset  . Hyperlipidemia Mother   . Sleep apnea Mother   . Depression Mother   . Diabetes Father   . Hypertension Father   . COPD Father   . Diabetes Paternal Aunt   . Diabetes Paternal Uncle   .  Alcohol abuse Paternal Uncle   . Alcohol abuse Paternal Grandfather   . Depression Sister       Labs:  Recent Results (from the past 2160 hour(s))  GC/Chlamydia probe amp ([place])not at [facility]     Status: None   Collection Time: 10/23/16 12:00 AM  Result Value Ref Range   Chlamydia Negative     Comment: Normal Reference Range - Negative   Neisseria gonorrhea Negative     Comment: Normal Reference Range - Negative  TSH     Status: None   Collection Time: 10/23/16  2:32 PM  Result Value Ref Range   TSH 2.500 0.450 - 4.500 uIU/mL  T3, free     Status: None   Collection Time: 10/23/16  2:32 PM  Result  Value Ref Range   T3, Free 2.4 2.0 - 4.4 pg/mL  HIV antibody (with reflex)     Status: None   Collection Time: 10/23/16  2:32 PM  Result Value Ref Range   HIV Screen 4th Generation wRfx Non Reactive Non Reactive  T4, free     Status: Abnormal   Collection Time: 10/23/16  2:32 PM  Result Value Ref Range   Free T4 0.76 (L) 0.82 - 1.77 ng/dL  CBC     Status: Abnormal   Collection Time: 10/23/16  2:32 PM  Result Value Ref Range   WBC 7.6 3.4 - 10.8 x10E3/uL   RBC 4.39 3.77 - 5.28 x10E6/uL   Hemoglobin 12.4 11.1 - 15.9 g/dL   Hematocrit 37.2 34.0 - 46.6 %   MCV 85 79 - 97 fL   MCH 28.2 26.6 - 33.0 pg   MCHC 33.3 31.5 - 35.7 g/dL   RDW 13.9 12.3 - 15.4 %   Platelets 535 (H) 150 - 379 x10E3/uL        Mental Status Examination;   Psychiatric Specialty Exam: Physical Exam  Constitutional: She appears well-developed and well-nourished. No distress.  Skin: She is not diaphoretic.    Review of Systems  Cardiovascular: Negative for chest pain.  Gastrointestinal: Negative for nausea.  Skin: Negative for itching.  Neurological: Negative for tremors and headaches.  Psychiatric/Behavioral: Negative for depression and hallucinations.    Last menstrual period 10/20/2016.There is no height or weight on file to calculate BMI.  General Appearance: Casual  Eye Contact::  Fair  Speech:  Normal Rate  Volume:  Normal  Mood:  euthymic  Affect:  Congruent  Thought Process:  Coherent  Orientation:  Full (Time, Place, and Person)  Thought Content:  Rumination  Suicidal Thoughts:  No  Homicidal Thoughts:  No  Memory:  Immediate;   Fair Recent;   Fair  Judgement:  Fair  Insight:  Shallow  Psychomotor Activity:  Normal  Concentration:  Fair  Recall:  Fair  Akathisia:  Negative  Handed:  Right  AIMS (if indicated):     Assets:  Communication Skills Desire for Improvement Financial Resources/Insurance Housing  Sleep:        Assessment: Axis I: mood disorder unspecified. Rule out  mood disorder secondary to general medical condition. Major depressive disorder recurrent moderate. Rule out panic disorder. Generalized anxiety disorder   Axis III:  Past Medical History:  Diagnosis Date  . Anxiety   . Arthritis    back and hips   . Depression   . Diabetes mellitus without complication ([place])   . GERD (gastroesophageal reflux disease)   . History of hiatal hernia   . Hyperlipidemia   . Hypothyroidism   .  PONV (postoperative nausea and vomiting)    slow to wake up   . Shortness of breath dyspnea    with exertion   . Sleep apnea    cpap -0 setting at 16   . Stress incontinence     Axis IV: psychosocial   Treatment Plan and Summary:  Depression: continue celexa brand name  Anxiety: stable with infrequent panic attacks. Continue xanax prn small dose and celexa Mood disorder: lamictal helps . No rash  Refills sent.  Bariatric surgery: recovering and loosing weight.    She is a non-smoker Pertinent Labs and Relevant Prior Notes reviewed. Medication Side effects, benefits and risks reviewed/discussed with Patient. Time given for patient to respond and asks questions regarding the Diagnosis and Medications. Safety concerns and to report to ER if suicidal or call 911.  FU 3 months or earlier if needed.  Merian Capron, MD 11/13/2016

## 2016-11-19 ENCOUNTER — Telehealth: Payer: Self-pay | Admitting: *Deleted

## 2016-11-19 ENCOUNTER — Encounter: Payer: Self-pay | Admitting: *Deleted

## 2016-11-19 NOTE — Telephone Encounter (Signed)
Letter sent to pt's home address with her pap results and that she needs a repeat pap in 1 year.

## 2016-11-19 NOTE — Telephone Encounter (Signed)
-----   Message from Emily Filbert, MD sent at 11/13/2016  4:24 PM EST ----- Pap in a year due to + HR HPV

## 2016-11-25 ENCOUNTER — Other Ambulatory Visit: Payer: Self-pay

## 2016-11-25 MED ORDER — MISOPROSTOL 200 MCG PO TABS: 200.0000 ug | ORAL_TABLET | Freq: Four times a day (QID) | ORAL | 0 refills | Status: DC

## 2016-11-26 MED FILL — lamoTRIgine 200 MG TABS: 200 | 30 days supply | Qty: 30 | Fill #0

## 2016-11-26 MED FILL — CITALOPRAM HBR 40 MG TABLET: 40 | 30 days supply | Qty: 30 | Fill #1

## 2016-11-26 MED FILL — ALPRAZolam 0.5 MG TABS: 0.5 | 30 days supply | Qty: 30 | Fill #0

## 2016-12-02 MED FILL — miSOPROStol 200 MCG TABS: 200 | 1 days supply | Qty: 2 | Fill #0

## 2016-12-11 ENCOUNTER — Ambulatory Visit (INDEPENDENT_AMBULATORY_CARE_PROVIDER_SITE_OTHER): Payer: 59 | Admitting: Obstetrics & Gynecology

## 2016-12-11 ENCOUNTER — Encounter: Payer: Self-pay | Admitting: Obstetrics & Gynecology

## 2016-12-11 VITALS — BP 124/79 | HR 84 | Ht 65.0 in | Wt 191.0 lb

## 2016-12-11 DIAGNOSIS — Z3202 Encounter for pregnancy test, result negative: Secondary | ICD-10-CM

## 2016-12-11 DIAGNOSIS — Z30430 Encounter for insertion of intrauterine contraceptive device: Secondary | ICD-10-CM | POA: Diagnosis not present

## 2016-12-11 DIAGNOSIS — Z30019 Encounter for initial prescription of contraceptives, unspecified: Secondary | ICD-10-CM

## 2016-12-11 MED ORDER — LEVONORGESTREL 20 MCG/24HR IU IUD
INTRAUTERINE_SYSTEM | Freq: Once | INTRAUTERINE | Status: AC
Start: 2016-12-11 — End: 2016-12-11
  Administered 2016-12-11: 15:00:00 via INTRAUTERINE

## 2016-12-11 NOTE — Progress Notes (Signed)
   Subjective:    Patient ID: Shannon [name], female    DOB: [birth year], 51 y.o.   MRN: [id number]  HPI 51 yo SW lady with fibroids and dysmenorrhea is here for Mirena insertion. She took IBU today.   Review of Systems She currently uses condoms for contraception.    Objective:   Physical Exam Pleasant WNWHWFNAD UPT negative, consent signed, Time out procedure done. Cervix prepped with betadine and grasped with a single tooth tenaculum. Mirena was easily placed and the strings were cut to 3-4 cm. Uterus sounded to 10 cm. She tolerated the procedure well.     Assessment & Plan:  Dysmenorrhea, fibroid- Mirena Back up for 2 weeks RTC 3 months

## 2016-12-20 ENCOUNTER — Emergency Department (HOSPITAL_COMMUNITY)
Admission: EM | Admit: 2016-12-20 | Discharge: 2016-12-20 | Disposition: A | Discharge status: Home / Self Care | Payer: 59 | Attending: Emergency Medicine | Admitting: Emergency Medicine

## 2016-12-20 ENCOUNTER — Encounter (HOSPITAL_COMMUNITY): Payer: Self-pay | Admitting: Emergency Medicine

## 2016-12-20 DIAGNOSIS — E039 Hypothyroidism, unspecified: Secondary | ICD-10-CM | POA: Diagnosis not present

## 2016-12-20 DIAGNOSIS — E119 Type 2 diabetes mellitus without complications: Secondary | ICD-10-CM | POA: Diagnosis not present

## 2016-12-20 DIAGNOSIS — R42 Dizziness and giddiness: Secondary | ICD-10-CM | POA: Insufficient documentation

## 2016-12-20 HISTORY — DX: Papillomavirus as the cause of diseases classified elsewhere: B97.7

## 2016-12-20 LAB — URINALYSIS, ROUTINE W REFLEX MICROSCOPIC
BILIRUBIN URINE: NEGATIVE
Glucose, UA: NEGATIVE mg/dL
KETONES UR: NEGATIVE mg/dL
Nitrite: NEGATIVE
PROTEIN: 30 mg/dL — AB
SPECIFIC GRAVITY, URINE: 1.033 — AB (ref 1.005–1.030)
pH: 5 (ref 5.0–8.0)

## 2016-12-20 LAB — CBG MONITORING, ED: GLUCOSE-CAPILLARY: 121 mg/dL — AB (ref 65–99)

## 2016-12-20 LAB — I-STAT CHEM 8, ED
BUN: 15 mg/dL (ref 6–20)
CALCIUM ION: 1.09 mmol/L — AB (ref 1.15–1.40)
Chloride: 108 mmol/L (ref 101–111)
Creatinine, Ser: 0.5 mg/dL (ref 0.44–1.00)
GLUCOSE: 82 mg/dL (ref 65–99)
HCT: 32 % — ABNORMAL LOW (ref 36.0–46.0)
HEMOGLOBIN: 10.9 g/dL — AB (ref 12.0–15.0)
Potassium: 3.6 mmol/L (ref 3.5–5.1)
SODIUM: 143 mmol/L (ref 135–145)
TCO2: 24 mmol/L (ref 0–100)

## 2016-12-20 LAB — I-STAT BETA HCG BLOOD, ED (MC, WL, AP ONLY)

## 2016-12-20 MED ORDER — SODIUM CHLORIDE 0.9 % IV BOLUS (SEPSIS)
1000.0000 mL | Freq: Once | INTRAVENOUS | Status: AC
  Administered 2016-12-20: 1000 mL via INTRAVENOUS

## 2016-12-20 NOTE — Discharge Instructions (Signed)
Your labs and EKG are all essentially normal. You can be discharged home and should rest for the next 1-2 days. Return to the emergency department if you have any new or worsening symptoms.

## 2016-12-20 NOTE — ED Notes (Signed)
Bed: WA03 Expected date:  Expected time:  Means of arrival:  Comments: 

## 2016-12-20 NOTE — ED Triage Notes (Signed)
Pt c/o dizziness since 9pm tonight; pt got really hot and lost her balance at work, continued to feel dizzy and abnormal; reports facial numbness under jaw bones; denies acute vision changes and confusion; denies ataxic gait; IUD inserted 1 week ago and pt has been having heavy vaginal bleeding since Monday; A&Ox4;

## 2016-12-22 NOTE — ED Provider Notes (Signed)
[facility] DEPT Provider Note   CSN: [id number] Arrival date & time: 12/20/16  0132     History   Chief Complaint Chief Complaint  Patient presents with  . Dizziness    HPI Shannon [name] is a 51 y.o. female.  Patient presents with dizziness that started earlier tonight while at work. She states symptoms culminated in becoming hot and losing her balance while walking. No full syncope. No falls. She denies vomiting, recent illness, change in medications or diet. She reports she had an IUD inserted one week ago and has had heavy vaginal bleeding since, a total of 4 days.    The history is provided by the patient. No language interpreter was used.  Dizziness  Associated symptoms: no chest pain, no shortness of breath and no vomiting     Past Medical History:  Diagnosis Date  . Anxiety   . Arthritis    back and hips   . Depression   . Diabetes mellitus without complication ([place])   . GERD (gastroesophageal reflux disease)   . History of hiatal hernia   . HPV (human papilloma virus) infection   . Hyperlipidemia   . Hypothyroidism   . PONV (postoperative nausea and vomiting)    slow to wake up   . Shortness of breath dyspnea    with exertion   . Sleep apnea    cpap -0 setting at 16   . Stress incontinence     Patient Active Problem List   Diagnosis Date Noted  . 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 Fibroids 10/30/2016  . DDD (degenerative disc disease), cervical 07/31/2016  . Hiatal hernia 08/13/2015  . 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 Generalized anxiety disorder 03/16/2015  . BMI 38.0-38.9,adult 12/29/2014  . DDD (degenerative disc disease), lumbar 08/28/2014  . Hypothyroidism 08/15/2014  . Diabetes mellitus type II, controlled ([place]) 08/15/2014  . OSA on CPAP 03/31/2014  . Abnormal weight gain 03/31/2014  . Sleep apnea 02/05/2012  . Mood disorder ([place]) 02/05/2012  . Hyperlipidemia 11/22/2008    Past Surgical History:  Procedure Laterality Date  .  APPENDECTOMY    . BREATH TEK H PYLORI N/A 04/03/2015   Procedure: BREATH TEK H PYLORI;  Surgeon: Greer Pickerel, MD;  Location: [facility];  Service: General;  Laterality: N/A;  . CESAREAN SECTION    . 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   second interspace, left foot  . feet surgery    . LAPAROSCOPIC GASTRIC SLEEVE RESECTION WITH HIATAL HERNIA REPAIR N/A 08/13/2015   Procedure: LAPAROSCOPIC GASTRIC SLEEVE RESECTION WITH HIATAL HERNIA REPAIR;  Surgeon: Greer Pickerel, MD;  Location: [facility];  Service: General;  Laterality: N/A;  . laproscopy    . UPPER GI ENDOSCOPY  08/13/2015   Procedure: UPPER GI ENDOSCOPY;  Surgeon: Greer Pickerel, MD;  Location: [facility];  Service: General;;  . WISDOM TOOTH EXTRACTION      OB History    Gravida Para Term Preterm AB Living   1 1 1     1    SAB TAB Ectopic Multiple Live Births                   Home Medications    Prior to Admission medications   Medication Sig Start Date End Date Taking? Authorizing Provider  ALPRAZolam Duanne Moron) 0.5 MG tablet Take 1 tablet (0.5 mg total) by mouth at bedtime as needed for anxiety. 11/13/16  Yes Merian Capron, MD  BIOTIN PO Take 1 tablet by mouth daily.   Yes Historical Provider, MD  CALCIUM PO Take 1 tablet by mouth daily.    Yes Historical Provider, MD  citalopram (CELEXA) 40 MG tablet Take 1 tablet (40 mg total) by mouth daily. Take one tablet of 40mg  qd. Brand name medically necessary. 11/13/16  Yes Merian Capron, MD  lamoTRIgine (LAMICTAL) 200 MG tablet Take 1 tablet (200 mg total) by mouth daily. 11/13/16  Yes Merian Capron, MD  Multiple Vitamin (MULTIVITAMIN) tablet Take 1 tablet by mouth daily.   Yes Historical Provider, MD  vitamin B-12 (CYANOCOBALAMIN) 1000 MCG tablet Take 1,000 mcg by mouth daily.   Yes Historical Provider, MD    Family History Family History  Problem Relation Age of Onset  . Hyperlipidemia Mother   . Sleep apnea  Mother   . Depression Mother   . Diabetes Father   . Hypertension Father   . COPD Father   . Diabetes Paternal Aunt   . Diabetes Paternal Uncle   . Alcohol abuse Paternal Uncle   . Alcohol abuse Paternal Grandfather   . Depression Sister     Social History Social History  Substance Use Topics  . Smoking status: Never Smoker  . Smokeless tobacco: Never Used  . Alcohol use No     Allergies   Food; Anette Guarneri [lurasidone hcl]; Lipitor [atorvastatin]; Belviq [lorcaserin hcl]; and Contrave [naltrexone-bupropion hcl er]   Review of Systems Review of Systems  Constitutional: Negative for chills and fever.  HENT: Negative.   Eyes: Negative for visual disturbance.  Respiratory: Negative.  Negative for shortness of breath.   Cardiovascular: Negative.  Negative for chest pain.  Gastrointestinal: Negative.  Negative for abdominal pain and vomiting.  Genitourinary: Positive for vaginal bleeding.  Musculoskeletal: Negative.   Skin: Negative.   Neurological: Positive for dizziness. Negative for syncope.     Physical Exam Updated Vital Signs BP 113/78 (BP Location: Right Arm)   Pulse 66   Temp 98.3 F (36.8 C) (Oral)   Resp 17   Ht 5\' 5"  (1.651 m)   Wt 86.2 kg   LMP 12/17/2016 (Exact Date)   SpO2 98%   BMI 31.62 kg/m   Physical Exam  Constitutional: She is oriented to person, place, and time. She appears well-developed and well-nourished.  HENT:  Head: Normocephalic.  Eyes:  No conjunctival pallor.   Neck: Normal range of motion. Neck supple.  Cardiovascular: Normal rate and regular rhythm.   No murmur heard. Pulmonary/Chest: Effort normal and breath sounds normal. She has no wheezes. She has no rales.  Abdominal: Soft. Bowel sounds are normal. There is no tenderness. There is no rebound and no guarding.  Musculoskeletal: Normal range of motion. She exhibits no edema.  Neurological: She is alert and oriented to person, place, and time. She exhibits normal muscle tone.    Skin: Skin is warm and dry. No rash noted.  Psychiatric: She has a normal mood and affect.     ED Treatments / Results  Labs (all labs ordered are listed, but only abnormal results are displayed) Labs Reviewed  URINALYSIS, ROUTINE W REFLEX MICROSCOPIC - Abnormal; Notable for the following:       Result Value   APPearance CLOUDY (*)    Specific Gravity, Urine 1.033 (*)    Hgb urine dipstick LARGE (*)    Protein, ur 30 (*)    Leukocytes, UA TRACE (*)    Bacteria, UA RARE (*)  Squamous Epithelial / LPF 0-5 (*)    All other components within normal limits  CBG MONITORING, ED - Abnormal; Notable for the following:    Glucose-Capillary 121 (*)    All other components within normal limits  I-STAT CHEM 8, ED - Abnormal; Notable for the following:    Calcium, Ion 1.09 (*)    Hemoglobin 10.9 (*)    HCT 32.0 (*)    All other components within normal limits  CBG MONITORING, ED  I-STAT BETA HCG BLOOD, ED ([facility], [facility], [facility] ONLY)    EKG  EKG Interpretation  Date/Time:  Saturday December 20 2016 01:49:04 EST Ventricular Rate:  87 PR Interval:    QRS Duration: 90 QT Interval:  376 QTC Calculation: 453 R Axis:   24 Text Interpretation:  Sinus rhythm Baseline wander in lead(s) V4 normal. no change Confirmed by LITTLE MD, RACHEL [id number]) on 12/21/2016 10:30:52 AM       Radiology No results found.  Procedures Procedures (including critical care time)  Medications Ordered in ED Medications  sodium chloride 0.9 % bolus 1,000 mL (0 mLs Intravenous Stopped 12/20/16 [id number])     Initial Impression / Assessment and Plan / ED Course  I have reviewed the triage vital signs and the nursing notes.  Pertinent labs & imaging results that were available during my care of the patient were reviewed by me and considered in my medical decision making (see chart for details).     Patient presents with onset dizziness tonight while at work. She reports work has been stressful lately but denies current  symptoms previously. No Syncope.   Here, there is no tachycardia, VSS (normal). IVF's provided with improvement in symptoms. Lab studies reassuring, Hgb 10.0. She can be discharged home with close PCP follow up. Patient feels comfortable with discharge home.   Final Clinical Impressions(s) / ED Diagnoses   Final diagnoses:  Lightheadedness    New Prescriptions Discharge Medication List as of 12/20/2016  6:42 AM       Charlann Lange, PA-C 12/22/16 0111    Charlesetta Shanks, MD 12/28/16 [phone]

## 2017-01-12 MED FILL — lamoTRIgine 200 MG TABS: 200 | 30 days supply | Qty: 30 | Fill #1

## 2017-01-14 ENCOUNTER — Other Ambulatory Visit: Payer: Self-pay | Admitting: *Deleted

## 2017-01-14 DIAGNOSIS — Z30431 Encounter for routine checking of intrauterine contraceptive device: Secondary | ICD-10-CM

## 2017-01-14 MED ORDER — LEVONORGESTREL 20 MCG/24HR IU IUD: 1.0000 | INTRAUTERINE_SYSTEM | Freq: Once | INTRAUTERINE | 0 refills | Status: DC

## 2017-01-14 MED FILL — MIRENA SYSTEM: 20 | 90 days supply | Qty: 1 | Fill #0

## 2017-01-19 ENCOUNTER — Telehealth (HOSPITAL_COMMUNITY): Payer: Self-pay | Admitting: Psychiatry

## 2017-01-19 DIAGNOSIS — F331 Major depressive disorder, recurrent, moderate: Secondary | ICD-10-CM

## 2017-01-19 NOTE — Telephone Encounter (Signed)
Pt needs refill on celexa. [facility]

## 2017-01-20 MED ORDER — CITALOPRAM HYDROBROMIDE 40 MG PO TABS: 40.0000 mg | ORAL_TABLET | Freq: Every day | ORAL | 1 refills | Status: DC

## 2017-01-20 MED ORDER — CITALOPRAM HYDROBROMIDE 40 MG PO TABS
40.0000 mg | ORAL_TABLET | Freq: Every day | ORAL | 1 refills | Status: DC
Start: 2017-01-20 — End: 2017-02-12

## 2017-01-20 MED FILL — CITALOPRAM HBR 40 MG TABLET: 40 | 30 days supply | Qty: 30 | Fill #0

## 2017-01-20 NOTE — Telephone Encounter (Signed)
Medication refill- pt contact office requesting a refill for Celexa. Per Dr. De Nurse, refill request for Celexa 40mg , #30 w/ 1 refill is authorize. Prescription was phoned into  [facility]. Pt's next apt is schedule on 02/05/17. Called and informed pt of refill status. Pt verbalizes understanding.

## 2017-01-30 ENCOUNTER — Telehealth (HOSPITAL_COMMUNITY): Payer: Self-pay | Admitting: Psychiatry

## 2017-01-30 MED ORDER — ALPRAZOLAM 0.5 MG PO TABS: 0.5000 mg | ORAL_TABLET | Freq: Every evening | ORAL | 0 refills | Status: DC | PRN

## 2017-01-30 NOTE — Telephone Encounter (Signed)
Pt needs refill on xanax. °

## 2017-01-30 NOTE — Telephone Encounter (Signed)
Can send refill or fax to pharmacy

## 2017-01-30 NOTE — Telephone Encounter (Signed)
Medication refill- pt call office requesting a refill for Xanax. Per Dr. De Nurse, refill is authorize for Xanax 0.5mg , #30. Lvm providing verbal order for refill at [facility]. Informed pt of refill status. Pt's next apt is schedule on 02/05/17. Pt verbalizes understanding.

## 2017-02-02 DIAGNOSIS — G4733 Obstructive sleep apnea (adult) (pediatric): Secondary | ICD-10-CM | POA: Diagnosis not present

## 2017-02-05 ENCOUNTER — Ambulatory Visit (HOSPITAL_COMMUNITY): Payer: Self-pay | Admitting: Psychiatry

## 2017-02-12 ENCOUNTER — Encounter (HOSPITAL_COMMUNITY): Payer: Self-pay | Admitting: Psychiatry

## 2017-02-12 ENCOUNTER — Ambulatory Visit (INDEPENDENT_AMBULATORY_CARE_PROVIDER_SITE_OTHER): Payer: 59 | Admitting: Psychiatry

## 2017-02-12 VITALS — BP 118/72 | HR 79 | Resp 16 | Ht 65.0 in | Wt 198.0 lb

## 2017-02-12 DIAGNOSIS — F410 Panic disorder [episodic paroxysmal anxiety] without agoraphobia: Secondary | ICD-10-CM | POA: Diagnosis not present

## 2017-02-12 DIAGNOSIS — F411 Generalized anxiety disorder: Secondary | ICD-10-CM

## 2017-02-12 DIAGNOSIS — Z811 Family history of alcohol abuse and dependence: Secondary | ICD-10-CM

## 2017-02-12 DIAGNOSIS — F331 Major depressive disorder, recurrent, moderate: Secondary | ICD-10-CM | POA: Diagnosis not present

## 2017-02-12 DIAGNOSIS — Z818 Family history of other mental and behavioral disorders: Secondary | ICD-10-CM

## 2017-02-12 DIAGNOSIS — Z79899 Other long term (current) drug therapy: Secondary | ICD-10-CM | POA: Diagnosis not present

## 2017-02-12 DIAGNOSIS — F0630 Mood disorder due to known physiological condition, unspecified: Secondary | ICD-10-CM | POA: Diagnosis not present

## 2017-02-12 MED ORDER — CITALOPRAM HYDROBROMIDE 40 MG PO TABS: 40.0000 mg | ORAL_TABLET | Freq: Every day | ORAL | 1 refills | Status: DC

## 2017-02-12 MED ORDER — BUPROPION HCL ER (SR) 100 MG PO TB12: 100.0000 mg | ORAL_TABLET | Freq: Every day | ORAL | 0 refills | Status: DC

## 2017-02-12 MED ORDER — LAMOTRIGINE 200 MG PO TABS: 200.0000 mg | ORAL_TABLET | Freq: Every day | ORAL | 1 refills | Status: DC

## 2017-02-12 MED FILL — BUPROPION HCL SR 100 MG TAB: 100 | 30 days supply | Qty: 30 | Fill #0

## 2017-02-12 NOTE — Progress Notes (Signed)
Patient ID: Shannon [name], female   DOB: February 05, 1966, 51 y.o.   MRN: [id number]  [facility] Follow up visit  KRISHAWNA STIEFEL [id number] 51 y.o.  02/12/2017 10:25 AM  Chief Complaint:  Depression follow up  History of Present Illness:   Patient Presents for follow up and medication management for Major depression and Generalized anxiety disorder.  Patient is working night shift . She'll be feeling more down and depressed a motivated. She is concerned about her son with ADHD and is difficult to manage. She feels at times withdrawn decreased energy tiredness and crying spells.  Modifying factors : crafts work   Severity of depression. Worsen now 5/10     Past Psychiatric History/Hospitalization(s) Manged for depression and mood symptoms for more then 20 years.  Prior Suicide Attempts: No  Medical History; Past Medical History:  Diagnosis Date  . Anxiety   . Arthritis    back and hips   . Depression   . Diabetes mellitus without complication ([place])   . GERD (gastroesophageal reflux disease)   . History of hiatal hernia   . HPV (human papilloma virus) infection   . Hyperlipidemia   . Hypothyroidism   . PONV (postoperative nausea and vomiting)    slow to wake up   . Shortness of breath dyspnea    with exertion   . Sleep apnea    cpap -0 setting at 16   . Stress incontinence     Allergies: Allergies  Allergen Reactions  . Food Swelling    Big Red Gum makes her tongue swell  . Latuda [Lurasidone Hcl] Other (See Comments)    Severe aggitation  . Lipitor [Atorvastatin] Other (See Comments)    Muscle aches  . Belviq [Lorcaserin Hcl]     Increased appetitie  . Contrave [Naltrexone-Bupropion Hcl Er] Other (See Comments)    Sleepy.    Medications: Outpatient Encounter Prescriptions as of 02/12/2017  Medication Sig  . ALPRAZolam (XANAX) 0.5 MG tablet Take 1 tablet (0.5 mg total) by mouth at bedtime as needed for anxiety.  Marland Kitchen BIOTIN PO Take 1 tablet by  mouth daily.  Marland Kitchen CALCIUM PO Take 1 tablet by mouth daily.   . citalopram (CELEXA) 40 MG tablet Take 1 tablet (40 mg total) by mouth daily. Take one tablet of 40mg  qd. Brand name medically necessary.  . lamoTRIgine (LAMICTAL) 200 MG tablet Take 1 tablet (200 mg total) by mouth daily.  Marland Kitchen levonorgestrel (MIRENA) 20 MCG/24HR IUD 1 Intra Uterine Device (1 each total) by Intrauterine route once.  . Multiple Vitamin (MULTIVITAMIN) tablet Take 1 tablet by mouth daily.  . vitamin B-12 (CYANOCOBALAMIN) 1000 MCG tablet Take 1,000 mcg by mouth daily.  . [DISCONTINUED] citalopram (CELEXA) 40 MG tablet Take 1 tablet (40 mg total) by mouth daily. Take one tablet of 40mg  qd. Brand name medically necessary.  . [DISCONTINUED] lamoTRIgine (LAMICTAL) 200 MG tablet Take 1 tablet (200 mg total) by mouth daily.  Marland Kitchen buPROPion (WELLBUTRIN SR) 100 MG 12 hr tablet Take 1 tablet (100 mg total) by mouth daily.   No facility-administered encounter medications on file as of 02/12/2017.      Family History; Family History  Problem Relation Age of Onset  . Hyperlipidemia Mother   . Sleep apnea Mother   . Depression Mother   . Diabetes Father   . Hypertension Father   . COPD Father   . Diabetes Paternal Aunt   . Diabetes Paternal Uncle   . Alcohol abuse  Paternal Uncle   . Alcohol abuse Paternal Grandfather   . Depression Sister       Labs:  Recent Results (from the past 2160 hour(s))  Urinalysis, Routine w reflex microscopic     Status: Abnormal   Collection Time: 12/20/16  1:41 AM  Result Value Ref Range   Color, Urine YELLOW YELLOW   APPearance CLOUDY (A) CLEAR   Specific Gravity, Urine 1.033 (H) 1.005 - 1.030   pH 5.0 5.0 - 8.0   Glucose, UA NEGATIVE NEGATIVE mg/dL   Hgb urine dipstick LARGE (A) NEGATIVE   Bilirubin Urine NEGATIVE NEGATIVE   Ketones, ur NEGATIVE NEGATIVE mg/dL   Protein, ur 30 (A) NEGATIVE mg/dL   Nitrite NEGATIVE NEGATIVE   Leukocytes, UA TRACE (A) NEGATIVE   RBC / HPF TOO  NUMEROUS TO COUNT 0 - 5 RBC/hpf   WBC, UA 6-30 0 - 5 WBC/hpf   Bacteria, UA RARE (A) NONE SEEN   Squamous Epithelial / LPF 0-5 (A) NONE SEEN   Mucous PRESENT   CBG monitoring, ED     Status: Abnormal   Collection Time: 12/20/16  1:58 AM  Result Value Ref Range   Glucose-Capillary 121 (H) 65 - 99 mg/dL  I-Stat Chem 8, ED     Status: Abnormal   Collection Time: 12/20/16  5:47 AM  Result Value Ref Range   Sodium 143 135 - 145 mmol/L   Potassium 3.6 3.5 - 5.1 mmol/L   Chloride 108 101 - 111 mmol/L   BUN 15 6 - 20 mg/dL   Creatinine, Ser 0.50 0.44 - 1.00 mg/dL   Glucose, Bld 82 65 - 99 mg/dL   Calcium, Ion 1.09 (L) 1.15 - 1.40 mmol/L   TCO2 24 0 - 100 mmol/L   Hemoglobin 10.9 (L) 12.0 - 15.0 g/dL   HCT 32.0 (L) 36.0 - 46.0 %  I-Stat beta hCG blood, ED     Status: None   Collection Time: 12/20/16  6:16 AM  Result Value Ref Range   I-stat hCG, quantitative <5.0 <5 mIU/mL   Comment 3            Comment:   GEST. AGE      CONC.  (mIU/mL)   <=1 WEEK        5 - 50     2 WEEKS       50 - 500     3 WEEKS       100 - 10,000     4 WEEKS     1,000 - 30,000        FEMALE AND NON-PREGNANT FEMALE:     LESS THAN 5 mIU/mL         Mental Status Examination;   Psychiatric Specialty Exam: Physical Exam  Constitutional: She appears well-developed and well-nourished. No distress.  Skin: She is not diaphoretic.    Review of Systems  Cardiovascular: Negative for palpitations.  Gastrointestinal: Negative for nausea.  Skin: Negative for itching.  Neurological: Negative for tremors and headaches.  Psychiatric/Behavioral: Positive for depression. Negative for hallucinations.    Blood pressure 118/72, pulse 79, resp. rate 16, height 5\' 5"  (1.651 m), weight 198 lb (89.8 kg), SpO2 97 %.Body mass index is 32.95 kg/m.  General Appearance: Casual  Eye Contact::  Fair  Speech:  Normal Rate  Volume:  Normal  Mood: depressed  Affect:  constricted  Thought Process:  Coherent  Orientation:  Full  (Time, Place, and Person)  Thought Content:  Rumination  Suicidal  Thoughts:  No  Homicidal Thoughts:  No  Memory:  Immediate;   Fair Recent;   Fair  Judgement:  Fair  Insight:  Shallow  Psychomotor Activity:  Normal  Concentration:  Fair  Recall:  Fair  Akathisia:  Negative  Handed:  Right  AIMS (if indicated):     Assets:  Communication Skills Desire for Improvement Financial Resources/Insurance Housing  Sleep:        Assessment: Axis I: mood disorder unspecified. Rule out mood disorder secondary to general medical condition. Major depressive disorder recurrent moderate. Rule out panic disorder. Generalized anxiety disorder   Axis III:  Past Medical History:  Diagnosis Date  . Anxiety   . Arthritis    back and hips   . Depression   . Diabetes mellitus without complication ([place])   . GERD (gastroesophageal reflux disease)   . History of hiatal hernia   . HPV (human papilloma virus) infection   . Hyperlipidemia   . Hypothyroidism   . PONV (postoperative nausea and vomiting)    slow to wake up   . Shortness of breath dyspnea    with exertion   . Sleep apnea    cpap -0 setting at 16   . Stress incontinence     Axis IV: psychosocial   Treatment Plan and Summary:  Depression: worse. Continue celexa and lamictal. Add wellbutrin 100mg  qd Anxiety: manageable with celexa and prn xanax Mood disorder: depressed. Continue lamcital  Bariatric surgery: recovering and loosing weight.    She is a non-smoker Reviewed side effects provided supportive therapy she is to work on improving her own likeliness and increasing her activities for herself. FU 1 month. Or earlier if needed   Merian Capron, MD 02/12/2017

## 2017-02-16 MED FILL — ALPRAZolam 0.5 MG TABS: 0.5 | 30 days supply | Qty: 30 | Fill #0

## 2017-02-18 ENCOUNTER — Ambulatory Visit: Payer: Self-pay

## 2017-03-02 MED FILL — CITALOPRAM HBR 40 MG TABLET: 40 | 30 days supply | Qty: 30 | Fill #1

## 2017-03-02 MED FILL — lamoTRIgine 200 MG TABS: 200 | 30 days supply | Qty: 30 | Fill #0

## 2017-03-18 ENCOUNTER — Encounter (HOSPITAL_COMMUNITY): Payer: Self-pay

## 2017-03-25 ENCOUNTER — Ambulatory Visit (HOSPITAL_COMMUNITY): Payer: Self-pay | Admitting: Psychiatry

## 2017-03-26 ENCOUNTER — Encounter (HOSPITAL_COMMUNITY): Payer: Self-pay | Admitting: Psychiatry

## 2017-03-26 ENCOUNTER — Ambulatory Visit (INDEPENDENT_AMBULATORY_CARE_PROVIDER_SITE_OTHER): Payer: 59 | Admitting: Psychiatry

## 2017-03-26 VITALS — BP 116/80 | HR 84 | Resp 16 | Ht 65.0 in | Wt 200.0 lb

## 2017-03-26 DIAGNOSIS — F411 Generalized anxiety disorder: Secondary | ICD-10-CM | POA: Diagnosis not present

## 2017-03-26 DIAGNOSIS — F0630 Mood disorder due to known physiological condition, unspecified: Secondary | ICD-10-CM | POA: Diagnosis not present

## 2017-03-26 DIAGNOSIS — Z811 Family history of alcohol abuse and dependence: Secondary | ICD-10-CM

## 2017-03-26 DIAGNOSIS — F331 Major depressive disorder, recurrent, moderate: Secondary | ICD-10-CM | POA: Diagnosis not present

## 2017-03-26 DIAGNOSIS — F410 Panic disorder [episodic paroxysmal anxiety] without agoraphobia: Secondary | ICD-10-CM

## 2017-03-26 DIAGNOSIS — Z818 Family history of other mental and behavioral disorders: Secondary | ICD-10-CM

## 2017-03-26 MED ORDER — BUPROPION HCL ER (SR) 100 MG PO TB12: 100.0000 mg | ORAL_TABLET | Freq: Every day | ORAL | 1 refills | Status: DC

## 2017-03-26 MED ORDER — CITALOPRAM HYDROBROMIDE 40 MG PO TABS: 40.0000 mg | ORAL_TABLET | Freq: Every day | ORAL | 0 refills | Status: DC

## 2017-03-26 MED ORDER — LAMOTRIGINE 200 MG PO TABS: 200.0000 mg | ORAL_TABLET | Freq: Every day | ORAL | 0 refills | Status: DC

## 2017-03-26 NOTE — Progress Notes (Signed)
Patient ID: DAYANE HILLENBURG, female   DOB: 1966-02-01, 51 y.o.   MRN: [id number]  [facility] Follow up visit  SHALISA MCQUADE [id number] 51 y.o.  03/26/2017 8:45 AM  Chief Complaint:  Depression follow up  History of Present Illness:   Patient Presents for follow up and medication management for Major depression and Generalized anxiety disorder.  Patient is working night shift .Last visit we added Wellbutrin for depression that has helped. She is feeling less depressed she still works night shift worries about her son who has ADHD Anxiety more manageable on celexa Mood symptoms baseline. Not worse on lamictal Panic attacks: infrequent. Xanax prn also for sleep.      Modifying factors : crafts work  Severity of depression. Somewhat better 6.5/10     Past Psychiatric History/Hospitalization(s) Manged for depression and mood symptoms for more then 20 years.  Prior Suicide Attempts: No  Medical History; Past Medical History:  Diagnosis Date  . Anxiety   . Arthritis    back and hips   . Depression   . Diabetes mellitus without complication ([place])   . GERD (gastroesophageal reflux disease)   . History of hiatal hernia   . HPV (human papilloma virus) infection   . Hyperlipidemia   . Hypothyroidism   . PONV (postoperative nausea and vomiting)    slow to wake up   . Shortness of breath dyspnea    with exertion   . Sleep apnea    cpap -0 setting at 16   . Stress incontinence     Allergies: Allergies  Allergen Reactions  . Food Swelling    Big Red Gum makes her tongue swell  . Latuda [Lurasidone Hcl] Other (See Comments)    Severe aggitation  . Lipitor [Atorvastatin] Other (See Comments)    Muscle aches  . Belviq [Lorcaserin Hcl]     Increased appetitie  . Contrave [Naltrexone-Bupropion Hcl Er] Other (See Comments)    Sleepy.    Medications: Outpatient Encounter Prescriptions as of 03/26/2017  Medication Sig  . ALPRAZolam (XANAX) 0.5 MG tablet  Take 1 tablet (0.5 mg total) by mouth at bedtime as needed for anxiety.  Marland Kitchen BIOTIN PO Take 1 tablet by mouth daily.  Marland Kitchen buPROPion (WELLBUTRIN SR) 100 MG 12 hr tablet Take 1 tablet (100 mg total) by mouth daily.  Marland Kitchen CALCIUM PO Take 1 tablet by mouth daily.   . citalopram (CELEXA) 40 MG tablet Take 1 tablet (40 mg total) by mouth daily. Take one tablet of 40mg  qd. Brand name medically necessary.  . lamoTRIgine (LAMICTAL) 200 MG tablet Take 1 tablet (200 mg total) by mouth daily.  Marland Kitchen levonorgestrel (MIRENA) 20 MCG/24HR IUD 1 Intra Uterine Device (1 each total) by Intrauterine route once.  . Multiple Vitamin (MULTIVITAMIN) tablet Take 1 tablet by mouth daily.  . vitamin B-12 (CYANOCOBALAMIN) 1000 MCG tablet Take 1,000 mcg by mouth daily.  . [DISCONTINUED] buPROPion (WELLBUTRIN SR) 100 MG 12 hr tablet Take 1 tablet (100 mg total) by mouth daily.  . [DISCONTINUED] citalopram (CELEXA) 40 MG tablet Take 1 tablet (40 mg total) by mouth daily. Take one tablet of 40mg  qd. Brand name medically necessary.  . [DISCONTINUED] lamoTRIgine (LAMICTAL) 200 MG tablet Take 1 tablet (200 mg total) by mouth daily.   No facility-administered encounter medications on file as of 03/26/2017.      Family History; Family History  Problem Relation Age of Onset  . Hyperlipidemia Mother   . Sleep apnea Mother   .  Depression Mother   . Diabetes Father   . Hypertension Father   . COPD Father   . Diabetes Paternal Aunt   . Diabetes Paternal Uncle   . Alcohol abuse Paternal Uncle   . Alcohol abuse Paternal Grandfather   . Depression Sister       Labs:  No results found for this or any previous visit (from the past 2160 hour(s)).      Mental Status Examination;   Psychiatric Specialty Exam: Physical Exam  Constitutional: She appears well-developed and well-nourished. No distress.  Skin: She is not diaphoretic.    Review of Systems  Cardiovascular: Negative for chest pain.  Gastrointestinal: Negative for  nausea.  Skin: Negative for rash.  Neurological: Negative for tremors and headaches.  Psychiatric/Behavioral: Negative for hallucinations.    Blood pressure 116/80, pulse 84, resp. rate 16, height 5\' 5"  (1.651 m), weight 200 lb (90.7 kg), SpO2 96 %.Body mass index is 33.28 kg/m.  General Appearance: Casual  Eye Contact::  Fair  Speech:  Normal Rate  Volume:  Normal  Mood: less depressed  Affect:  Somewhat reactive  Thought Process:  Coherent  Orientation:  Full (Time, Place, and Person)  Thought Content:  Rumination  Suicidal Thoughts:  No  Homicidal Thoughts:  No  Memory:  Immediate;   Fair Recent;   Fair  Judgement:  Fair  Insight:  Shallow  Psychomotor Activity:  Normal  Concentration:  Fair  Recall:  Fair  Akathisia:  Negative  Handed:  Right  AIMS (if indicated):     Assets:  Communication Skills Desire for Improvement Financial Resources/Insurance Housing  Sleep:        Assessment: Axis I: mood disorder unspecified. Rule out mood disorder secondary to general medical condition. Major depressive disorder recurrent moderate. Rule out panic disorder. Generalized anxiety disorder   Axis III:  Past Medical History:  Diagnosis Date  . Anxiety   . Arthritis    back and hips   . Depression   . Diabetes mellitus without complication ([place])   . GERD (gastroesophageal reflux disease)   . History of hiatal hernia   . HPV (human papilloma virus) infection   . Hyperlipidemia   . Hypothyroidism   . PONV (postoperative nausea and vomiting)    slow to wake up   . Shortness of breath dyspnea    with exertion   . Sleep apnea    cpap -0 setting at 16   . Stress incontinence     Axis IV: psychosocial   Treatment Plan and Summary:  Depression: improved. Continue lamictal, celexa and wellbutrin  Anxiety: baseline. Not worse. On celexa Mood disorder: improved Panic attacks: infrequent. Takes xanax prn Bariatric surgery: recovering and loosing weight.     Prescriptions given except xanax for now.  Questions addressed side effects reviewed prior to supportive therapy follow-up in 2-3 months   Merian Capron, MD 03/26/2017

## 2017-04-15 ENCOUNTER — Telehealth (HOSPITAL_COMMUNITY): Payer: Self-pay | Admitting: *Deleted

## 2017-04-15 ENCOUNTER — Other Ambulatory Visit (HOSPITAL_COMMUNITY): Payer: Self-pay | Admitting: Psychiatry

## 2017-04-15 DIAGNOSIS — F331 Major depressive disorder, recurrent, moderate: Secondary | ICD-10-CM

## 2017-04-15 MED FILL — lamoTRIgine 200 MG TABS: 200 | 30 days supply | Qty: 30 | Fill #1

## 2017-04-15 MED FILL — BUPROPION HCL SR 100 MG TAB: 100 | 30 days supply | Qty: 30 | Fill #0

## 2017-04-15 NOTE — Telephone Encounter (Signed)
Medication refill- received fax from [facility] requesting a refill for Xanax. Last filled on 01/30/17. Patient next apt is schedule on 06/17/17. Please advise.

## 2017-04-15 NOTE — Telephone Encounter (Signed)
Received fax from [facility] requesting a refill for Celexa. Per Dr. De Nurse, refill request is denied. Refill was request too early. Last refill was printed on 03/26/17. Faxed response to pharmacy. Nothing further is need at this time.

## 2017-04-17 MED FILL — CITALOPRAM HBR 40 MG TABLET: 40 | 30 days supply | Qty: 30 | Fill #0

## 2017-04-17 NOTE — Telephone Encounter (Signed)
Can send or verbally order to pharmacy for refill for xanax

## 2017-04-20 MED ORDER — ALPRAZOLAM 0.5 MG PO TABS: 0.5000 mg | ORAL_TABLET | Freq: Every evening | ORAL | 0 refills | Status: DC | PRN

## 2017-04-20 MED FILL — ALPRAZolam 0.5 MG TABS: 0.5 | 30 days supply | Qty: 30 | Fill #0

## 2017-04-20 NOTE — Telephone Encounter (Signed)
Medication refill- received fax from [facility] requesting a refill for Xanax. Per Dr. De Nurse, refill is authorize for Xanax 0.5mg , #30. Verbal order was provided to [facility]. Patient's next apt is schedule on 06/17/17. Notified patient.

## 2017-04-30 ENCOUNTER — Ambulatory Visit: Payer: Self-pay | Admitting: Sports Medicine

## 2017-05-06 DIAGNOSIS — G4733 Obstructive sleep apnea (adult) (pediatric): Secondary | ICD-10-CM | POA: Diagnosis not present

## 2017-05-07 ENCOUNTER — Ambulatory Visit: Payer: Self-pay | Admitting: Sports Medicine

## 2017-05-18 ENCOUNTER — Other Ambulatory Visit (HOSPITAL_COMMUNITY): Payer: Self-pay | Admitting: Psychiatry

## 2017-05-18 DIAGNOSIS — F331 Major depressive disorder, recurrent, moderate: Secondary | ICD-10-CM

## 2017-05-18 MED FILL — lamoTRIgine 200 MG TABS: 200 | 30 days supply | Qty: 30 | Fill #0

## 2017-05-19 MED FILL — CITALOPRAM HBR 40 MG TABLET: 40 | 30 days supply | Qty: 30 | Fill #0

## 2017-05-19 NOTE — Telephone Encounter (Signed)
Medication refill- received refill request from [facility] requesting a refill for Celexa. Per Dr. De Nurse, refill request is authorize for Celexa 40mg , #30. Rx was sent to pharmacy. Pt's next apt is schedule on 06/17/17. Lvm informing pt of refill status. Nothing further is need at this time.

## 2017-06-17 ENCOUNTER — Ambulatory Visit (INDEPENDENT_AMBULATORY_CARE_PROVIDER_SITE_OTHER): Payer: 59 | Admitting: Psychiatry

## 2017-06-17 ENCOUNTER — Encounter (HOSPITAL_COMMUNITY): Payer: Self-pay | Admitting: Psychiatry

## 2017-06-17 VITALS — BP 122/74 | HR 95 | Resp 16 | Ht 65.0 in | Wt 204.0 lb

## 2017-06-17 DIAGNOSIS — F410 Panic disorder [episodic paroxysmal anxiety] without agoraphobia: Secondary | ICD-10-CM | POA: Diagnosis not present

## 2017-06-17 DIAGNOSIS — Z811 Family history of alcohol abuse and dependence: Secondary | ICD-10-CM

## 2017-06-17 DIAGNOSIS — F0630 Mood disorder due to known physiological condition, unspecified: Secondary | ICD-10-CM | POA: Diagnosis not present

## 2017-06-17 DIAGNOSIS — Z818 Family history of other mental and behavioral disorders: Secondary | ICD-10-CM

## 2017-06-17 DIAGNOSIS — F411 Generalized anxiety disorder: Secondary | ICD-10-CM

## 2017-06-17 DIAGNOSIS — F331 Major depressive disorder, recurrent, moderate: Secondary | ICD-10-CM

## 2017-06-17 DIAGNOSIS — Z9884 Bariatric surgery status: Secondary | ICD-10-CM

## 2017-06-17 MED ORDER — ALPRAZOLAM 0.5 MG PO TABS: 0.5000 mg | ORAL_TABLET | Freq: Every evening | ORAL | 0 refills | Status: DC | PRN

## 2017-06-17 MED ORDER — CITALOPRAM HYDROBROMIDE 40 MG PO TABS: 40.0000 mg | ORAL_TABLET | Freq: Every day | ORAL | 1 refills | Status: DC

## 2017-06-17 MED ORDER — LAMOTRIGINE 200 MG PO TABS: 200.0000 mg | ORAL_TABLET | Freq: Every day | ORAL | 1 refills | Status: DC

## 2017-06-17 MED FILL — lamoTRIgine 200 MG TABS: 200 | 30 days supply | Qty: 30 | Fill #0

## 2017-06-17 MED FILL — CITALOPRAM HBR 40 MG TABLET: 40 | 30 days supply | Qty: 30 | Fill #0

## 2017-06-17 NOTE — Progress Notes (Signed)
Patient ID: Shannon [name], female   DOB: 08/11/1966, 51 y.o.   MRN: [id number]  [facility] Follow up visit  ANDA SOBOTTA [id number] 51 y.o.  06/17/2017 8:53 AM  Chief Complaint:  Depression follow up  History of Present Illness:   Patient Presents for follow up and medication management for Major depression and Generalized anxiety disorder.  Lastly Wellbutrin was helping now she has realized that it is causing her to be more anxious. She stopped that she is doing reasonable she works second shift at [employer]. No symptoms are baseline she takes Xanax when necessary for panic attacks modifying factor is her son. 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  Prior Suicide Attempts: No  Medical History; Past Medical History:  Diagnosis Date  . Anxiety   . Arthritis    back and hips   . Depression   . Diabetes mellitus without complication ([place])   . GERD (gastroesophageal reflux disease)   . History of hiatal hernia   . HPV (human papilloma virus) infection   . Hyperlipidemia   . Hypothyroidism   . PONV (postoperative nausea and vomiting)    slow to wake up   . Shortness of breath dyspnea    with exertion   . Sleep apnea    cpap -0 setting at 16   . Stress incontinence     Allergies: Allergies  Allergen Reactions  . Food Swelling    Big Red Gum makes her tongue swell  . Latuda [Lurasidone Hcl] Other (See Comments)    Severe aggitation  . Lipitor [Atorvastatin] Other (See Comments)    Muscle aches  . Belviq [Lorcaserin Hcl]     Increased appetitie  . Contrave [Naltrexone-Bupropion Hcl Er] Other (See Comments)    Sleepy.     Medications: Outpatient Encounter Prescriptions as of 06/17/2017  Medication Sig  . ALPRAZolam (XANAX) 0.5 MG tablet Take 1 tablet (0.5 mg total) by mouth at bedtime as needed for anxiety.  Marland Kitchen BIOTIN PO Take 1 tablet by mouth daily.  Marland Kitchen CALCIUM PO Take 1 tablet by mouth daily.   . citalopram (CELEXA) 40 MG tablet Take 1 tablet (40 mg total) by mouth daily.  Marland Kitchen lamoTRIgine (LAMICTAL) 200 MG tablet Take 1 tablet (200 mg total) by mouth daily.  Marland Kitchen levonorgestrel (MIRENA) 20 MCG/24HR IUD 1 Intra Uterine Device (1 each total) by Intrauterine route once.  . Multiple Vitamin (MULTIVITAMIN) tablet Take 1 tablet by mouth daily.  . vitamin B-12 (CYANOCOBALAMIN) 1000 MCG tablet Take 1,000 mcg by mouth daily.  . [DISCONTINUED] ALPRAZolam (XANAX) 0.5 MG tablet Take 1 tablet (0.5 mg total) by mouth at bedtime as needed for anxiety.  . [DISCONTINUED] citalopram (CELEXA) 40 MG tablet TAKE 1 TABLET BY MOUTH DAILY  . [DISCONTINUED] lamoTRIgine (LAMICTAL) 200 MG tablet Take 1 tablet (200 mg total) by mouth daily.  . [DISCONTINUED] buPROPion (WELLBUTRIN SR) 100 MG 12 hr tablet Take 1 tablet (100 mg total) by mouth daily. (Patient not taking: Reported on 06/17/2017)   No facility-administered encounter medications on file as of 06/17/2017.      Family History; Family History  Problem Relation Age of Onset  .  Hyperlipidemia Mother   . Sleep apnea Mother   . Depression Mother   . Diabetes Father   . Hypertension Father   . COPD Father   . Diabetes Paternal Aunt   . Diabetes Paternal Uncle   . Alcohol abuse Paternal Uncle   . Alcohol abuse Paternal Grandfather   . Depression Sister       Labs:  No results found for this or any previous visit (from the past 2160 hour(s)).      Mental Status Examination;   Psychiatric Specialty Exam: Physical Exam  Constitutional: She appears well-developed and well-nourished. No distress.  Skin: She is not diaphoretic.    Review of Systems   Cardiovascular: Negative for chest pain.  Gastrointestinal: Negative for nausea.  Skin: Negative for rash.  Neurological: Negative for tremors and headaches.  Psychiatric/Behavioral: Negative for depression and hallucinations.    Blood pressure 122/74, pulse 95, resp. rate 16, height 5\' 5"  (1.651 m), weight 204 lb (92.5 kg), SpO2 96 %.Body mass index is 33.95 kg/m.  General Appearance: Casual  Eye Contact::  Fair  Speech:  Normal Rate  Volume:  Normal  Mood: euthymic  Affect:  congruent  Thought Process:  Coherent  Orientation:  Full (Time, Place, and Person)  Thought Content:  Rumination  Suicidal Thoughts:  No  Homicidal Thoughts:  No  Memory:  Immediate;   Fair Recent;   Fair  Judgement:  Fair  Insight:  Shallow  Psychomotor Activity:  Normal  Concentration:  Fair  Recall:  Fair  Akathisia:  Negative  Handed:  Right  AIMS (if indicated):     Assets:  Communication Skills Desire for Improvement Financial Resources/Insurance Housing  Sleep:        Assessment: Axis I: mood disorder unspecified. Rule out mood disorder secondary to general medical condition. Major depressive disorder recurrent moderate. Rule out panic disorder. Generalized anxiety disorder   Axis III:  Past Medical History:  Diagnosis Date  . Anxiety   . Arthritis    back and hips   . Depression   . Diabetes mellitus without complication ([place])   . GERD (gastroesophageal reflux disease)   . History of hiatal hernia   . HPV (human papilloma virus) infection   . Hyperlipidemia   . Hypothyroidism   . PONV (postoperative nausea and vomiting)    slow to wake up   . Shortness of breath dyspnea    with exertion   . Sleep apnea    cpap -0 setting at 16   . Stress incontinence     Axis IV: psychosocial   Treatment Plan and Summary:  Depression: baseline. Not worse. Continue celexa and lamictal Stop wellbutrin Anxiety: balance. Continue prn xanax and celexa Mood disorder: balance Panic  attacks: infrequent. Take celexa and prn xanax Bariatric surgery: recovering and loosing weight.    Questions addressed to see her around winter time so that he can think of adding Wellbutrin if needed follow-up in 2 months. Prescriptions Merian Capron, MD 06/17/2017

## 2017-06-30 MED FILL — ALPRAZolam 0.5 MG TABS: 0.5 | 30 days supply | Qty: 30 | Fill #0

## 2017-07-03 ENCOUNTER — Encounter: Payer: Self-pay | Admitting: Physician Assistant

## 2017-07-08 ENCOUNTER — Ambulatory Visit (INDEPENDENT_AMBULATORY_CARE_PROVIDER_SITE_OTHER): Payer: 59 | Admitting: Physician Assistant

## 2017-07-08 ENCOUNTER — Encounter: Payer: Self-pay | Admitting: Physician Assistant

## 2017-07-08 VITALS — BP 126/69 | HR 99 | Ht 65.0 in | Wt 207.0 lb

## 2017-07-08 DIAGNOSIS — Z23 Encounter for immunization: Secondary | ICD-10-CM

## 2017-07-08 DIAGNOSIS — B977 Papillomavirus as the cause of diseases classified elsewhere: Secondary | ICD-10-CM

## 2017-07-08 DIAGNOSIS — Z1231 Encounter for screening mammogram for malignant neoplasm of breast: Secondary | ICD-10-CM | POA: Diagnosis not present

## 2017-07-08 DIAGNOSIS — Z131 Encounter for screening for diabetes mellitus: Secondary | ICD-10-CM

## 2017-07-08 DIAGNOSIS — Z0000 Encounter for general adult medical examination without abnormal findings: Secondary | ICD-10-CM

## 2017-07-08 DIAGNOSIS — Z13220 Encounter for screening for lipoid disorders: Secondary | ICD-10-CM | POA: Diagnosis not present

## 2017-07-08 DIAGNOSIS — R635 Abnormal weight gain: Secondary | ICD-10-CM | POA: Diagnosis not present

## 2017-07-08 NOTE — Progress Notes (Signed)
Subjective:     Shannon [name] is a 51 y.o. female and is here for a comprehensive physical exam. The patient reports problems - see below..  She is s/p gastric sleeve and gaining weight back. At her lowest she was 160 and BMI of 27. Slowly she has started eating more and gaining her weight back. This makes her very sad.   Social History   Social History  . Marital status: Divorced    Spouse name: N/A  . Number of children: N/A  . Years of education: N/A   Occupational History  . Not on file.   Social History Main Topics  . Smoking status: Never Smoker  . Smokeless tobacco: Never Used  . Alcohol use No  . Drug use: No  . Sexual activity: Yes    Birth control/ protection: Condom, IUD     Comment: IUD inserted 12/12/16   Other Topics Concern  . Not on file   Social History Narrative  . No narrative on file   Health Maintenance  Topic Date Due  . OPHTHALMOLOGY EXAM  02/07/2015  . FOOT EXAM  12/29/2015  . URINE MICROALBUMIN  12/29/2015  . MAMMOGRAM  01/12/2016  . HEMOGLOBIN A1C  05/08/2016  . INFLUENZA VACCINE  05/20/2017  . PAP SMEAR  11/05/2019  . PNEUMOCOCCAL POLYSACCHARIDE VACCINE (2) 12/29/2019  . TETANUS/TDAP  01/08/2023  . HIV Screening  Completed    The following portions of the patient's history were reviewed and updated as appropriate: allergies, current medications, past family history, past medical history, past social history, past surgical history and problem list.  Review of Systems Pertinent items noted in HPI and remainder of comprehensive ROS otherwise negative.   Objective:    BP 126/69   Pulse 99   Ht 5\' 5"  (1.651 m)   Wt 207 lb (93.9 kg)   BMI 34.45 kg/m  General appearance: alert, cooperative, appears stated age and moderately obese Head: Normocephalic, without obvious abnormality, atraumatic Eyes: conjunctivae/corneas clear. PERRL, EOM's intact. Fundi benign. Ears: normal TM's and external ear canals both ears Nose: Nares normal. Septum  midline. Mucosa normal. No drainage or sinus tenderness. Throat: lips, mucosa, and tongue normal; teeth and gums normal Neck: no adenopathy, no carotid bruit, no JVD, supple, symmetrical, trachea midline and thyroid not enlarged, symmetric, no tenderness/mass/nodules Back: symmetric, no curvature. ROM normal. No CVA tenderness. Lungs: clear to auscultation bilaterally Heart: regular rate and rhythm, S1, S2 normal, no murmur, click, rub or gallop Abdomen: soft, non-tender; bowel sounds normal; no masses,  no organomegaly Extremities: extremities normal, atraumatic, no cyanosis or edema Pulses: 2+ and symmetric Skin: Skin color, texture, turgor normal. No rashes or lesions Lymph nodes: Cervical, supraclavicular, and axillary nodes normal. Neurologic: Alert and oriented X 3, normal strength and tone. Normal symmetric reflexes. Normal coordination and gait    Assessment:    Healthy female exam.     Plan:    Marland KitchenMarland KitchenAmy was seen today for annual exam.  Diagnoses and all orders for this visit:  Routine physical examination -     TSH -     CBC -     B12 -     VITAMIN D 25 Hydroxy (Vit-D Deficiency, Fractures) -     Lipid Panel w/reflex Direct LDL -     COMPLETE METABOLIC PANEL WITH GFR  Need for immunization against influenza -     Flu Vaccine QUAD 36+ mos IM  HPV in female  Abnormal weight gain -  TSH -     CBC -     B12 -     VITAMIN D 25 Hydroxy (Vit-D Deficiency, Fractures)  Screening for lipid disorders -     Lipid Panel w/reflex Direct LDL  Screening for diabetes mellitus -     COMPLETE METABOLIC PANEL WITH GFR  Visit for screening mammogram -     MM SCREENING BREAST TOMO BILATERAL; Future   .Marland Kitchen Depression screen [facility] 2/9 07/08/2017 12/17/2015 10/09/2015 08/28/2015 07/18/2015  Decreased Interest 1 0 0 0 0  Down, Depressed, Hopeless 1 0 0 0 0  PHQ - 2 Score 2 0 0 0 0  Altered sleeping 2 - - - -  Tired, decreased energy 2 - - - -  Change in appetite 3 - - - -  Feeling  bad or failure about yourself  1 - - - -  Trouble concentrating 2 - - - -  Moving slowly or fidgety/restless 0 - - - -  Suicidal thoughts 0 - - - -  PHQ-9 Score 12 - - - -  Difficult doing work/chores - - - - -  Some encounter information is confidential and restricted. Go to Review Flowsheets activity to see all data.   . Discussed 150 minutes of exercise a week.  Encouraged vitamin D 1000 units and Calcium 1300mg  or 4 servings of dairy a day.  .Discussed low carb diet with 1500 calories and 80g of protein.  My Fitness Pal could be a Microbiologist.   Depression is around her weight gain. She plans to get back on a more controlled weight loss program.   See After Visit Summary for Counseling Recommendations

## 2017-07-08 NOTE — Patient Instructions (Signed)

## 2017-07-10 DIAGNOSIS — Z0000 Encounter for general adult medical examination without abnormal findings: Secondary | ICD-10-CM | POA: Diagnosis not present

## 2017-07-10 DIAGNOSIS — R635 Abnormal weight gain: Secondary | ICD-10-CM | POA: Diagnosis not present

## 2017-07-10 DIAGNOSIS — Z131 Encounter for screening for diabetes mellitus: Secondary | ICD-10-CM | POA: Diagnosis not present

## 2017-07-10 DIAGNOSIS — Z13220 Encounter for screening for lipoid disorders: Secondary | ICD-10-CM | POA: Diagnosis not present

## 2017-07-11 LAB — COMPLETE METABOLIC PANEL WITH GFR
AG Ratio: 1.7 (calc) (ref 1.0–2.5)
ALBUMIN MSPROF: 4.1 g/dL (ref 3.6–5.1)
ALT: 9 U/L (ref 6–29)
AST: 13 U/L (ref 10–35)
Alkaline phosphatase (APISO): 86 U/L (ref 33–115)
BILIRUBIN TOTAL: 0.3 mg/dL (ref 0.2–1.2)
BUN: 14 mg/dL (ref 7–25)
CALCIUM: 9 mg/dL (ref 8.6–10.2)
CHLORIDE: 103 mmol/L (ref 98–110)
CO2: 28 mmol/L (ref 20–32)
CREATININE: 0.65 mg/dL (ref 0.50–1.10)
GFR, EST AFRICAN AMERICAN: 121 mL/min/{1.73_m2} (ref >=60)
GFR, EST NON AFRICAN AMERICAN: 104 mL/min/{1.73_m2} (ref >=60)
GLUCOSE: 97 mg/dL (ref 65–99)
Globulin: 2.4 g/dL (calc) (ref 1.9–3.7)
Potassium: 4.2 mmol/L (ref 3.5–5.3)
Sodium: 138 mmol/L (ref 135–146)
TOTAL PROTEIN: 6.5 g/dL (ref 6.1–8.1)

## 2017-07-11 LAB — CBC
HEMATOCRIT: 34.8 % — AB (ref 35.0–45.0)
HEMOGLOBIN: 10.9 g/dL — AB (ref 11.7–15.5)
MCH: 24.9 pg — AB (ref 27.0–33.0)
MCHC: 31.3 g/dL — AB (ref 32.0–36.0)
MCV: 79.5 fL — ABNORMAL LOW (ref 80.0–100.0)
MPV: 9 fL (ref 7.5–12.5)
Platelets: 508 10*3/uL — ABNORMAL HIGH (ref 140–400)
RBC: 4.38 10*6/uL (ref 3.80–5.10)
RDW: 14.7 % (ref 11.0–15.0)
WBC: 8.8 10*3/uL (ref 3.8–10.8)

## 2017-07-11 LAB — LIPID PANEL W/REFLEX DIRECT LDL
CHOLESTEROL: 203 mg/dL — AB (ref <200)
HDL: 45 mg/dL — ABNORMAL LOW (ref >50)
LDL CHOLESTEROL (CALC): 132 mg/dL — AB
Non-HDL Cholesterol (Calc): 158 mg/dL (calc) — ABNORMAL HIGH (ref <130)
Total CHOL/HDL Ratio: 4.5 (calc) (ref <5.0)
Triglycerides: 145 mg/dL (ref <150)

## 2017-07-11 LAB — VITAMIN B12: VITAMIN B 12: 474 pg/mL (ref 200–1100)

## 2017-07-11 LAB — VITAMIN D 25 HYDROXY (VIT D DEFICIENCY, FRACTURES): VIT D 25 HYDROXY: 18 ng/mL — AB (ref 30–100)

## 2017-07-11 LAB — TSH: TSH: 2.78 m[IU]/L

## 2017-07-30 ENCOUNTER — Ambulatory Visit (INDEPENDENT_AMBULATORY_CARE_PROVIDER_SITE_OTHER): Payer: 59 | Admitting: Sports Medicine

## 2017-07-30 ENCOUNTER — Encounter: Payer: Self-pay | Admitting: Sports Medicine

## 2017-07-30 DIAGNOSIS — M5030 Other cervical disc degeneration, unspecified cervical region: Secondary | ICD-10-CM | POA: Diagnosis not present

## 2017-07-30 MED ORDER — CYCLOBENZAPRINE HCL 10 MG PO TABS: ORAL_TABLET | ORAL | 0 refills | Status: DC

## 2017-07-30 MED ORDER — PREDNISONE 50 MG PO TABS: ORAL_TABLET | ORAL | 0 refills | Status: DC

## 2017-07-30 MED FILL — predniSONE 50 MG TABS: 50 | 5 days supply | Qty: 5 | Fill #0

## 2017-07-30 MED FILL — CYCLOBENZAPRINE 10 MG TAB: 10 | 15 days supply | Qty: 30 | Fill #0

## 2017-07-30 NOTE — Progress Notes (Signed)
  Subjective:    CC: Neck pain  HPI: This is a pleasant 51 year old female, she comes in after a years long history of neck pain, saw her about a year ago, we found C6-C7 degenerative disc disease with mild anterolisthesis on x-[place], she did well, proximally 50% better with physical therapy but struggled with dry needling. She did well until about March of this year when she had a recurrence of pain but delayed her presentation until now. Pain is axial, discogenic, nothing radicular, no constitutional symptoms. No trauma.  Past medical history:  Negative.  See flowsheet/record as well for more information.  Surgical history: Negative.  See flowsheet/record as well for more information.  Family history: Negative.  See flowsheet/record as well for more information.  Social history: Negative.  See flowsheet/record as well for more information.  Allergies, and medications have been entered into the medical record, reviewed, and no changes needed.   Review of Systems: No fevers, chills, night sweats, weight loss, chest pain, or shortness of breath.   Objective:    General: Well Developed, well nourished, and in no acute distress.  Neuro: Alert and oriented x3, extra-ocular muscles intact, sensation grossly intact.  HEENT: Normocephalic, atraumatic, pupils equal round reactive to light, neck supple, no masses, no lymphadenopathy, thyroid nonpalpable.  Skin: Warm and dry, no rashes. Cardiac: Regular rate and rhythm, no murmurs rubs or gallops, no lower extremity edema.  Respiratory: Clear to auscultation bilaterally. Not using accessory muscles, speaking in full sentences. Neck: Negative spurling's Full neck range of motion Grip strength and sensation normal in bilateral hands Strength good C4 to T1 distribution No sensory change to C4 to T1 Reflexes normal  Impression and Recommendations:    DDD (degenerative disc disease), cervical 1 year now on and off symptoms of cervical degenerative  disc disease, C6-C7 with slight anterolisthesis. Pain is axial without radiculopathy. 50% relief with physical therapy, we are going to do this again but without dry needling, prednisone, Flexeril at bedtime, I'm going to order an MRI, we are teeing her up for interventional planning. Return to see me to go over MRI results  ___________________________________________ Gwen Her. Dianah Field, M.D., ABFM., CAQSM. Primary Care and [place] Instructor of [employer] [facility]

## 2017-07-30 NOTE — Assessment & Plan Note (Signed)
1 year now on and off symptoms of cervical degenerative disc disease, C6-C7 with slight anterolisthesis. Pain is axial without radiculopathy. 50% relief with physical therapy, we are going to do this again but without dry needling, prednisone, Flexeril at bedtime, I'm going to order an MRI, we are teeing her up for interventional planning. Return to see me to go over MRI results

## 2017-08-10 ENCOUNTER — Ambulatory Visit (INDEPENDENT_AMBULATORY_CARE_PROVIDER_SITE_OTHER): Payer: 59

## 2017-08-10 DIAGNOSIS — M47892 Other spondylosis, cervical region: Secondary | ICD-10-CM

## 2017-08-10 DIAGNOSIS — M542 Cervicalgia: Secondary | ICD-10-CM | POA: Diagnosis not present

## 2017-08-10 DIAGNOSIS — M5030 Other cervical disc degeneration, unspecified cervical region: Secondary | ICD-10-CM

## 2017-08-12 ENCOUNTER — Ambulatory Visit (HOSPITAL_COMMUNITY): Payer: Self-pay | Admitting: Psychiatry

## 2017-08-17 ENCOUNTER — Ambulatory Visit: Payer: Self-pay | Admitting: Sports Medicine

## 2017-08-25 ENCOUNTER — Ambulatory Visit (INDEPENDENT_AMBULATORY_CARE_PROVIDER_SITE_OTHER): Payer: 59 | Admitting: Psychiatry

## 2017-08-25 ENCOUNTER — Encounter (HOSPITAL_COMMUNITY): Payer: Self-pay | Admitting: Psychiatry

## 2017-08-25 VITALS — BP 120/72 | HR 95 | Resp 16 | Ht 65.0 in | Wt 207.0 lb

## 2017-08-25 DIAGNOSIS — F331 Major depressive disorder, recurrent, moderate: Secondary | ICD-10-CM | POA: Diagnosis not present

## 2017-08-25 DIAGNOSIS — F411 Generalized anxiety disorder: Secondary | ICD-10-CM | POA: Diagnosis not present

## 2017-08-25 DIAGNOSIS — F0630 Mood disorder due to known physiological condition, unspecified: Secondary | ICD-10-CM | POA: Diagnosis not present

## 2017-08-25 MED ORDER — CITALOPRAM HYDROBROMIDE 40 MG PO TABS: 40.0000 mg | ORAL_TABLET | Freq: Every day | ORAL | 1 refills | Status: DC

## 2017-08-25 MED ORDER — VILAZODONE HCL 10 MG PO TABS: 10.0000 mg | ORAL_TABLET | Freq: Every day | ORAL | 1 refills | Status: DC

## 2017-08-25 MED ORDER — LAMOTRIGINE 200 MG PO TABS: 200.0000 mg | ORAL_TABLET | Freq: Every day | ORAL | 1 refills | Status: DC

## 2017-08-25 MED FILL — CITALOPRAM HBR 40 MG TABLET: 40 | 30 days supply | Qty: 30 | Fill #0

## 2017-08-25 MED FILL — lamoTRIgine 200 MG TABS: 200 | 30 days supply | Qty: 30 | Fill #0

## 2017-08-25 MED FILL — VIIBRYD 10 MG TABLET: 10 | 30 days supply | Qty: 30 | Fill #0

## 2017-08-25 NOTE — Progress Notes (Signed)
Patient ID: Shannon [name], female   DOB: Aug 21, 1966, 51 y.o.   MRN: [id number]  [facility] Follow up visit  Shannon [name] [id number] 51 y.o.  08/25/2017 1:51 PM  Chief Complaint:  Depression follow up  History of Present Illness:   Patient Presents for follow up and medication management for Major depression and Generalized anxiety disorder.  Returns for follow-up for depression she is feeling dysphoric sad depressed she works at [employer]. She takes Xanax when necessary but apparently she does get down and depressed during the wintertime Wellbutrin was tried earlier but it made her more moody she was also put on prednisone 2 weeks ago did may  contributed to it as well.   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 Now 5/10    Past Psychiatric History/Hospitalization(s) Manged for depression and mood symptoms for more then 20 years.  Prior Suicide Attempts: No  Medical History; Past Medical History:  Diagnosis Date  . Anxiety   . Arthritis    back and hips   . Depression   . Diabetes mellitus without complication ([place])   . GERD (gastroesophageal reflux disease)   . History of hiatal hernia   . HPV (human papilloma virus) infection   . Hyperlipidemia   . Hypothyroidism   . PONV (postoperative nausea and vomiting)    slow to wake up   . Shortness of breath dyspnea    with exertion   . Sleep apnea    cpap -0 setting at 16   . Stress incontinence     Allergies: Allergies  Allergen Reactions  . Food Swelling    Big Red Gum makes her tongue swell  . Latuda [Lurasidone Hcl] Other (See Comments)    Severe aggitation  . Lipitor [Atorvastatin] Other (See Comments)    Muscle aches  . Belviq [Lorcaserin Hcl]     Increased appetitie  . Contrave [Naltrexone-Bupropion Hcl Er] Other (See Comments)    Sleepy.     Medications: Outpatient Encounter Medications as of 08/25/2017  Medication Sig  . ALPRAZolam (XANAX) 0.5 MG tablet Take 1 tablet (0.5 mg total) by mouth at bedtime as needed for anxiety.  Marland Kitchen BIOTIN PO Take 1 tablet by mouth daily.  Marland Kitchen CALCIUM PO Take 1 tablet by mouth daily.   . citalopram (CELEXA) 40 MG tablet Take 1 tablet (40 mg total) daily by mouth.  . cyclobenzaprine (FLEXERIL) 10 MG tablet One half tab PO qHS, then increase gradually to one tab TID.  Marland Kitchen lamoTRIgine (LAMICTAL) 200 MG tablet Take 1 tablet (200 mg total) daily by mouth.  . levonorgestrel (MIRENA) 20 MCG/24HR IUD 1 Intra Uterine Device (1 each total) by Intrauterine route once.  . Multiple Vitamin (MULTIVITAMIN) tablet Take 1 tablet by mouth daily.  . predniSONE (DELTASONE) 50 MG tablet One tab PO daily for 5 days.  . vitamin B-12 (CYANOCOBALAMIN) 1000 MCG tablet Take 1,000 mcg by mouth daily.  . [DISCONTINUED] citalopram (CELEXA) 40 MG tablet Take 1 tablet (40 mg total) by mouth daily.  . [DISCONTINUED] lamoTRIgine (LAMICTAL) 200 MG tablet Take 1 tablet (200 mg total) by mouth daily.  . Vilazodone HCl (VIIBRYD) 10 MG TABS Take 1 tablet (10 mg total) daily by mouth.   No facility-administered encounter medications on file as of 08/25/2017.      Family History; Family History  Problem Relation Age of Onset  .  Hyperlipidemia Mother   . Sleep apnea Mother   . Depression Mother   . Diabetes Father   . Hypertension Father   . COPD Father   . Diabetes Paternal Aunt   . Diabetes Paternal Uncle   . Alcohol abuse Paternal Uncle   . Alcohol abuse Paternal Grandfather   . Depression Sister       Labs:  Recent Results (from the past 2160 hour(s))  TSH     Status: None   Collection Time: 07/10/17  7:45 AM  Result Value Ref Range   TSH 2.78 mIU/L    Comment:           Reference Range .           > or = 20 Years  0.40-4.50 .                Pregnancy Ranges           First trimester    0.26-2.66            Second trimester   0.55-2.73           Third trimester    0.43-2.91   CBC     Status: Abnormal   Collection Time: 07/10/17  7:45 AM  Result Value Ref Range   WBC 8.8 3.8 - 10.8 Thousand/uL   RBC 4.38 3.80 - 5.10 Million/uL   Hemoglobin 10.9 (L) 11.7 - 15.5 g/dL   HCT 34.8 (L) 35.0 - 45.0 %   MCV 79.5 (L) 80.0 - 100.0 fL   MCH 24.9 (L) 27.0 - 33.0 pg   MCHC 31.3 (L) 32.0 - 36.0 g/dL   RDW 14.7 11.0 - 15.0 %   Platelets 508 (H) 140 - 400 Thousand/uL   MPV 9.0 7.5 - 12.5 fL  B12     Status: None   Collection Time: 07/10/17  7:45 AM  Result Value Ref Range   Vitamin B-12 474 200 - 1,100 pg/mL  VITAMIN D 25 Hydroxy (Vit-D Deficiency, Fractures)     Status: Abnormal   Collection Time: 07/10/17  7:45 AM  Result Value Ref Range   Vit D, 25-Hydroxy 18 (L) 30 - 100 ng/mL    Comment: Vitamin D Status         25-OH Vitamin D: . Deficiency:                    <20 ng/mL Insufficiency:             20 - 29 ng/mL Optimal:                 > or = 30 ng/mL . For 25-OH Vitamin D testing on patients on  D2-supplementation and patients for whom quantitation  of D2 and D3 fractions is required, the QuestAssureD(TM) 25-OH VIT D, (D2,D3), LC/MS/MS is recommended: order  code 515-766-3236 (patients >34yrs). . For more information on this test, go to: http://education.questdiagnostics.com/faq/FAQ163 (This link is being provided for  informational/educational purposes only.)   Lipid Panel w/reflex Direct LDL     Status: Abnormal   Collection Time: 07/10/17  7:45 AM  Result Value Ref Range   Cholesterol 203 (H) <200 mg/dL   HDL 45 (L) >50 mg/dL   Triglycerides 145 <150 mg/dL   LDL Cholesterol (Calc) 132 (H) mg/dL (calc)    Comment: Reference range: <100 . Desirable range <100 mg/dL for primary prevention;   <70 mg/dL for patients with CHD or diabetic patients  with > or =  2 CHD risk factors. Marland Kitchen LDL-C is now calculated using the Martin-Hopkins  calculation, which is a validated novel method providing   better accuracy than the Friedewald equation in the  estimation of LDL-C.  Cresenciano Genre et al. [name]. 8299;371(69): 2061-2068  (http://education.[facility].com/faq/FAQ164)    Total CHOL/HDL Ratio 4.5 <5.0 (calc)   Non-HDL Cholesterol (Calc) 158 (H) <130 mg/dL (calc)    Comment: For patients with diabetes plus 1 major ASCVD risk  factor, treating to a non-HDL-C goal of <100 mg/dL  (LDL-C of <70 mg/dL) is considered a therapeutic  option.   COMPLETE METABOLIC PANEL WITH GFR     Status: None   Collection Time: 07/10/17  7:45 AM  Result Value Ref Range   Glucose, Bld 97 65 - 99 mg/dL    Comment: .            Fasting reference interval .    BUN 14 7 - 25 mg/dL   Creat 0.65 0.50 - 1.10 mg/dL   GFR, Est Non African American 104 > OR = 60 mL/min/1.1m2   GFR, Est African American 121 > OR = 60 mL/min/1.57m2   BUN/Creatinine Ratio NOT APPLICABLE 6 - 22 (calc)   Sodium 138 135 - 146 mmol/L   Potassium 4.2 3.5 - 5.3 mmol/L   Chloride 103 98 - 110 mmol/L   CO2 28 20 - 32 mmol/L   Calcium 9.0 8.6 - 10.2 mg/dL   Total Protein 6.5 6.1 - 8.1 g/dL   Albumin 4.1 3.6 - 5.1 g/dL   Globulin 2.4 1.9 - 3.7 g/dL (calc)   AG Ratio 1.7 1.0 - 2.5 (calc)   Total Bilirubin 0.3 0.2 - 1.2 mg/dL   Alkaline phosphatase (APISO) 86 33 - 115 U/L   AST 13 10 - 35 U/L   ALT 9 6 - 29 U/L        Mental Status Examination;   Psychiatric Specialty Exam: Physical Exam  Constitutional: She appears well-developed and well-nourished. No distress.  Skin: She is not diaphoretic.    Review of Systems  Cardiovascular: Negative for palpitations.  Gastrointestinal: Negative for nausea.  Skin: Negative for rash.  Neurological: Negative for tremors and headaches.  Psychiatric/Behavioral: Positive for depression. Negative for hallucinations.    Blood pressure 120/72, pulse 95, resp. rate 16, height 5\' 5"  (1.651 m), weight 207 lb (93.9 kg), SpO2 100 %.Body mass index is 34.45 kg/m.  General Appearance:  Casual  Eye Contact::  Fair  Speech:  Normal Rate  Volume:  Normal  Mood: dysphoric  Affect:  Congruent   Thought Process:  Coherent  Orientation:  Full (Time, Place, and Person)  Thought Content:  Rumination  Suicidal Thoughts:  No  Homicidal Thoughts:  No  Memory:  Immediate;   Fair Recent;   Fair  Judgement:  Fair  Insight:  Shallow  Psychomotor Activity:  Normal  Concentration:  Fair  Recall:  Fair  Akathisia:  Negative  Handed:  Right  AIMS (if indicated):     Assets:  Communication Skills Desire for Improvement Financial Resources/Insurance Housing  Sleep:        Assessment: Axis I: mood disorder unspecified. Rule out mood disorder secondary to general medical condition. Major depressive disorder recurrent moderate. Rule out panic disorder. Generalized anxiety disorder   Axis III:  Past Medical History:  Diagnosis Date  . Anxiety   . Arthritis    back and hips   . Depression   . Diabetes mellitus without complication ([place])   .  GERD (gastroesophageal reflux disease)   . History of hiatal hernia   . HPV (human papilloma virus) infection   . Hyperlipidemia   . Hypothyroidism   . PONV (postoperative nausea and vomiting)    slow to wake up   . Shortness of breath dyspnea    with exertion   . Sleep apnea    cpap -0 setting at 16   . Stress incontinence     Axis IV: psychosocial   Treatment Plan and Summary:  Depression: worse. Continue lamictal, celexa add vibryd 10mg  call back in 10 days to increase   Anxiety: fluctuates. Continue celexa. And prn xanax  Panic attacks: infrequent. Take celexa and prn xanax. Work on breathing techniques  Bariatric surgery: recovering and loosing weight.    Questions addressed. Fu 4 weeks or earlier if needed Merian Capron, MD 08/25/2017

## 2017-09-18 ENCOUNTER — Encounter (HOSPITAL_BASED_OUTPATIENT_CLINIC_OR_DEPARTMENT_OTHER): Payer: Self-pay

## 2017-09-18 ENCOUNTER — Emergency Department (HOSPITAL_BASED_OUTPATIENT_CLINIC_OR_DEPARTMENT_OTHER)
Admission: EM | Admit: 2017-09-18 | Discharge: 2017-09-18 | Disposition: A | Discharge status: Home / Self Care | Payer: 59 | Attending: Emergency Medicine | Admitting: Emergency Medicine

## 2017-09-18 ENCOUNTER — Other Ambulatory Visit: Payer: Self-pay

## 2017-09-18 ENCOUNTER — Emergency Department (HOSPITAL_BASED_OUTPATIENT_CLINIC_OR_DEPARTMENT_OTHER): Payer: 59

## 2017-09-18 DIAGNOSIS — M25511 Pain in right shoulder: Secondary | ICD-10-CM | POA: Diagnosis not present

## 2017-09-18 DIAGNOSIS — Z79899 Other long term (current) drug therapy: Secondary | ICD-10-CM | POA: Insufficient documentation

## 2017-09-18 DIAGNOSIS — E039 Hypothyroidism, unspecified: Secondary | ICD-10-CM | POA: Diagnosis not present

## 2017-09-18 MED ORDER — OXYCODONE-ACETAMINOPHEN 5-325 MG PO TABS
2.0000 | ORAL_TABLET | Freq: Once | ORAL | Status: AC
  Administered 2017-09-18: 2 via ORAL
  Filled 2017-09-18: qty 2

## 2017-09-18 MED ORDER — OXYCODONE-ACETAMINOPHEN 5-325 MG PO TABS: 2.0000 | ORAL_TABLET | Freq: Four times a day (QID) | ORAL | 0 refills | Status: DC | PRN

## 2017-09-18 MED ORDER — ONDANSETRON 4 MG PO TBDP
4.0000 mg | ORAL_TABLET | Freq: Once | ORAL | Status: AC
  Administered 2017-09-18: 4 mg via ORAL
  Filled 2017-09-18: qty 1

## 2017-09-18 MED ORDER — ONDANSETRON 4 MG PO TBDP: 4.0000 mg | ORAL_TABLET | Freq: Four times a day (QID) | ORAL | 0 refills | Status: DC | PRN

## 2017-09-18 MED FILL — ONDANSETRON ODT 4 MG TABLET: 4 | 6 days supply | Qty: 20 | Fill #0

## 2017-09-18 MED FILL — OXYCODONE-ACETAMINOPHEN 5-3: 5-325 | 3 days supply | Qty: 20 | Fill #0

## 2017-09-18 NOTE — ED Provider Notes (Addendum)
TIME SEEN: 3:50 AM  CHIEF COMPLAINT: Right shoulder pain  HPI: Patient is a 51 year old female with history of hypothyroidism, hyperlipidemia, diabetes who presents to the emergency department with right shoulder pain that started several days ago.  She is right-hand dominant.  She does not recall any injury.  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  She states that she feels like there is tingling in her hand but no numbness when I touch it.  Has difficult time moving her shoulder at all secondary to pain but has normal movement in her right elbow and wrist.  No chest pain or shortness of breath.  No neck pain.  Has been taking NSAIDs at home without much relief.  States she had similar symptoms several weeks ago and they resolved on their own after time and with NSAIDs.  States that this is more severe and more persistent.  ROS: See HPI Constitutional: no fever  Eyes: no drainage  ENT: no runny nose   Cardiovascular:  no chest pain  Resp: no SOB  GI: no vomiting GU: no dysuria Integumentary: no rash  Allergy: no hives  Musculoskeletal: no leg swelling  Neurological: no slurred speech ROS otherwise negative  PAST MEDICAL HISTORY/PAST SURGICAL HISTORY:  Past Medical History:  Diagnosis Date  . Anxiety   . Arthritis    back and hips   . Depression   . Diabetes mellitus without complication ([place])   . GERD (gastroesophageal reflux disease)   . History of hiatal hernia   . HPV (human papilloma virus) infection   . Hyperlipidemia   . Hypothyroidism   . PONV (postoperative nausea and vomiting)    slow to wake up   . Shortness of breath dyspnea    with exertion   . Sleep apnea    cpap -0 setting at 16   . Stress incontinence     MEDICATIONS:  Prior to Admission medications   Medication Sig Start Date End Date Taking? Authorizing Provider  CALCIUM PO Take 1 tablet by mouth daily.    Yes [provider]  cyclobenzaprine (FLEXERIL) 10 MG tablet One half  tab PO qHS, then increase gradually to one tab TID. 07/30/17  Yes Silverio Decamp, MD  Multiple Vitamin (MULTIVITAMIN) tablet Take 1 tablet by mouth daily.   Yes [provider]  predniSONE (DELTASONE) 50 MG tablet One tab PO daily for 5 days. 07/30/17  Yes Silverio Decamp, MD  Vilazodone HCl (VIIBRYD) 10 MG TABS Take 1 tablet (10 mg total) daily by mouth. 08/25/17  Yes Merian Capron, MD  vitamin B-12 (CYANOCOBALAMIN) 1000 MCG tablet Take 1,000 mcg by mouth daily.   Yes [provider]  ALPRAZolam Duanne Moron) 0.5 MG tablet Take 1 tablet (0.5 mg total) by mouth at bedtime as needed for anxiety. 06/17/17   Merian Capron, MD  BIOTIN PO Take 1 tablet by mouth daily.    [provider]  citalopram (CELEXA) 40 MG tablet Take 1 tablet (40 mg total) daily by mouth. 08/25/17   Merian Capron, MD  lamoTRIgine (LAMICTAL) 200 MG tablet Take 1 tablet (200 mg total) daily by mouth. 08/25/17   Merian Capron, MD  levonorgestrel (MIRENA) 20 MCG/24HR IUD 1 Intra Uterine Device (1 each total) by Intrauterine route once. 01/14/17 08/25/17  Emily Filbert, MD    ALLERGIES:  Allergies  Allergen Reactions  . Food Swelling    Big Red Gum makes her tongue swell  . Anette Guarneri [Lurasidone Hcl] Other (See  Comments)    Severe aggitation  . Lipitor [Atorvastatin] Other (See Comments)    Muscle aches  . Belviq [Lorcaserin Hcl]     Increased appetitie  . Contrave [Naltrexone-Bupropion Hcl Er] Other (See Comments)    Sleepy.    SOCIAL HISTORY:  Social History   Tobacco Use  . Smoking status: Never Smoker  . Smokeless tobacco: Never Used  Substance Use Topics  . Alcohol use: No    Alcohol/week: 0.0 oz    FAMILY HISTORY: Family History  Problem Relation Age of Onset  . Hyperlipidemia Mother   . Sleep apnea Mother   . Depression Mother   . Diabetes Father   . Hypertension Father   . COPD Father   . Diabetes Paternal Aunt   . Diabetes Paternal Uncle   . Alcohol abuse Paternal  Uncle   . Alcohol abuse Paternal Grandfather   . Depression Sister     EXAM: BP (!) 167/109 (BP Location: Left Arm)   Pulse 96   Temp 98.2 F (36.8 C) (Oral)   Resp 18   Ht 5' 5.5" (1.664 m)   Wt 96.1 kg (211 lb 14.4 oz)   SpO2 99%   BMI 34.73 kg/m  CONSTITUTIONAL: Alert and oriented and responds appropriately to questions. Well-appearing; well-nourished HEAD: Normocephalic EYES: Conjunctivae clear, pupils appear equal, EOMI ENT: normal nose; moist mucous membranes NECK: Supple, no meningismus, no nuchal rigidity, no LAD, no midline spinal tenderness or step-off or deformity, patient does have some tenderness over the posterior right trapezius muscle with a tight muscle in this area but no erythema, warmth, ecchymosis or swelling CARD: RRR; S1 and S2 appreciated; no murmurs, no clicks, no rubs, no gallops RESP: Normal chest excursion without splinting or tachypnea; breath sounds clear and equal bilaterally; no wheezes, no rhonchi, no rales, no hypoxia or respiratory distress, speaking full sentences ABD/GI: Normal bowel sounds; non-distended; soft, non-tender, no rebound, no guarding, no peritoneal signs, no hepatosplenomegaly BACK:  The back appears normal and is non-tender to palpation, there is no CVA tenderness EXT: No bony tenderness to palpation over the right shoulder no loss of fullness.  No ecchymosis, erythema or warmth.  Unable to move the shoulder at all secondary to pain which limits my examination.  No tenderness over the humerus, elbow, forearm, wrist or hand.  Normal right grip strength.  Normal sensation throughout the right arm.  2+ right radial pulse.  No joint effusion noted.  Compartments throughout the right arm are soft.  Otherwise normal ROM in all joints; otherwise extremities are non-tender to palpation; no edema; normal capillary refill; no cyanosis, no calf tenderness or swelling    SKIN: Normal color for age and race; warm; no rash NEURO: Moves all extremities  equally PSYCH: The patient's mood and manner are appropriate. Grooming and personal hygiene are appropriate.  MEDICAL DECISION MAKING: Patient here with right shoulder pain.  Could be tendinitis, rotator cuff tear, impingement syndrome.  Difficult to determine etiology of pain given patient cannot move her arm at all secondary to pain.  Will obtain x-rays but low suspicion for fracture.  Nothing at this time to suggest gout, septic arthritis, dislocation.  Neurovascularly intact distally.  Will give Percocet for pain control.  She has been doing NSAIDs at home without much relief.  Have recommended she continue this medical therapy with NSAIDs, ice and rest and will give outpatient orthopedic follow-up as she may need an MRI as an outpatient if her symptoms are not improving with  medical therapy.  ED PROGRESS: X-Zelenak shows calcific tendinitis versus bursitis.  Discussed with patient that this is managed the same as rotator cuff tendinitis with rest, ice, NSAIDs.  Given outpatient orthopedic follow-up information.  She also has a PCP.  Pain improved with Percocet.  Will discharge with prescription of the same.  Provided work note.   At this time, I do not feel there is any life-threatening condition present. I have reviewed and discussed all results (EKG, imaging, lab, urine as appropriate) and exam findings with patient/family. I have reviewed nursing notes and appropriate previous records.  I feel the patient is safe to be discharged home without further emergent workup and can continue workup as an outpatient as needed. Discussed usual and customary return precautions. Patient/family verbalize understanding and are comfortable with this plan.  Outpatient follow-up has been provided if needed. All questions have been answered.          Roscoe Witts, Delice Bison, DO 09/18/17 [phone]

## 2017-09-18 NOTE — ED Notes (Signed)
ED Provider at bedside. 

## 2017-09-18 NOTE — Discharge Instructions (Signed)
Continue ibuprofen 800 mg every 8 hours for the next week.  Take this medication with food.

## 2017-09-18 NOTE — Patient Outreach (Signed)
[facility]) Care Management  09/18/2017  OLIE SCAFFIDI 1965/12/03 [id number]   Case closed in [place] Management will contact member in January to continue diabetes disease management. Case closed for [facility] as member will be enrolled in an external program. Member and provider to be sent letter on transition to [place] RN, [facility] Care Management Coordinator-[facility] [place] [facility] [phone]

## 2017-09-18 NOTE — ED Notes (Signed)
Patient transported to [facility] 

## 2017-09-18 NOTE — ED Triage Notes (Signed)
Pt reports waking up 4 days ago with "extreme shoulder pain". Pt reports pain got worse tonight. Over the last 12 hours, pt has had aleve, ibuprofen, and extra strength tylenol. Pt has limited ROM.

## 2017-10-06 ENCOUNTER — Telehealth (HOSPITAL_COMMUNITY): Payer: Self-pay

## 2017-10-06 MED ORDER — ALPRAZOLAM 0.5 MG PO TABS: 0.5000 mg | ORAL_TABLET | Freq: Every evening | ORAL | 0 refills | Status: DC | PRN

## 2017-10-06 MED FILL — CITALOPRAM HBR 40 MG TABLET: 40 | 30 days supply | Qty: 30 | Fill #1

## 2017-10-06 MED FILL — lamoTRIgine 200 MG TABS: 200 | 30 days supply | Qty: 30 | Fill #1

## 2017-10-06 MED FILL — ALPRAZolam 0.5 MG TABS: 0.5 | 30 days supply | Qty: 30 | Fill #0

## 2017-10-06 MED FILL — VIIBRYD 10 MG TABLET: 10 | 30 days supply | Qty: 30 | Fill #1

## 2017-10-06 NOTE — Telephone Encounter (Signed)
Can send refill 

## 2017-10-06 NOTE — Telephone Encounter (Signed)
Per Dr. De Nurse, Xanax was called in to the pharmacy for a one month supply. Patient was notified. Nothing further is needed at this time.

## 2017-10-06 NOTE — Telephone Encounter (Signed)
Patient wants refill on Xanax. Next office visit is on 10/26/17. Please review and advise.

## 2017-10-07 ENCOUNTER — Ambulatory Visit (HOSPITAL_COMMUNITY): Payer: Self-pay | Admitting: Psychiatry

## 2017-10-26 ENCOUNTER — Ambulatory Visit (INDEPENDENT_AMBULATORY_CARE_PROVIDER_SITE_OTHER): Payer: 59 | Admitting: Psychiatry

## 2017-10-26 ENCOUNTER — Other Ambulatory Visit: Payer: Self-pay

## 2017-10-26 ENCOUNTER — Encounter (HOSPITAL_COMMUNITY): Payer: Self-pay | Admitting: Psychiatry

## 2017-10-26 VITALS — BP 140/88 | HR 92 | Ht 65.0 in | Wt 217.0 lb

## 2017-10-26 DIAGNOSIS — Z818 Family history of other mental and behavioral disorders: Secondary | ICD-10-CM

## 2017-10-26 DIAGNOSIS — Z811 Family history of alcohol abuse and dependence: Secondary | ICD-10-CM | POA: Diagnosis not present

## 2017-10-26 DIAGNOSIS — F331 Major depressive disorder, recurrent, moderate: Secondary | ICD-10-CM

## 2017-10-26 DIAGNOSIS — F0630 Mood disorder due to known physiological condition, unspecified: Secondary | ICD-10-CM | POA: Diagnosis not present

## 2017-10-26 DIAGNOSIS — F410 Panic disorder [episodic paroxysmal anxiety] without agoraphobia: Secondary | ICD-10-CM

## 2017-10-26 DIAGNOSIS — F411 Generalized anxiety disorder: Secondary | ICD-10-CM | POA: Diagnosis not present

## 2017-10-26 MED ORDER — LAMOTRIGINE 200 MG PO TABS: 200.0000 mg | ORAL_TABLET | Freq: Every day | ORAL | 2 refills | Status: DC

## 2017-10-26 MED ORDER — CITALOPRAM HYDROBROMIDE 40 MG PO TABS: 40.0000 mg | ORAL_TABLET | Freq: Every day | ORAL | 2 refills | Status: DC

## 2017-10-26 MED ORDER — VILAZODONE HCL 10 MG PO TABS: 10.0000 mg | ORAL_TABLET | Freq: Every day | ORAL | 2 refills | Status: DC

## 2017-10-26 NOTE — Progress Notes (Signed)
Patient ID: Shannon [name], female   DOB: 06-20-1966, 52 y.o.   MRN: [id number]  [facility] Follow up visit  Shannon [name] [id number] 53 y.o.  10/26/2017 10:46 AM  Chief Complaint:  Depression follow up  History of Present Illness:   Patient Presents for follow up and medication management for Major depression and Generalized anxiety disorder.  Adding vibryd has helped. Less depressed.  Less anxious. 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  Prior Suicide Attempts: No  Medical History; Past Medical History:  Diagnosis Date  . Anxiety   . Arthritis    back and hips   . Depression   . Diabetes mellitus without complication ([place])   . GERD (gastroesophageal reflux disease)   . History of hiatal hernia   . HPV (human papilloma virus) infection   . Hyperlipidemia   . Hypothyroidism   . PONV (postoperative nausea and vomiting)    slow to wake up   . Shortness of breath dyspnea    with exertion   . Sleep apnea    cpap -0 setting at 16   . Stress incontinence     Allergies: Allergies  Allergen Reactions  . Food Swelling    Big Red Gum makes her tongue swell  . Latuda [Lurasidone Hcl] Other (See Comments)    Severe aggitation  . Lipitor [Atorvastatin] Other (See Comments)    Muscle aches  . Belviq [Lorcaserin Hcl]     Increased appetitie  . Contrave [Naltrexone-Bupropion Hcl Er] Other (See Comments)    Sleepy.    Medications: Outpatient Encounter Medications as of 10/26/2017  Medication Sig  . ALPRAZolam (XANAX) 0.5 MG tablet Take 1 tablet (0.5 mg total) by mouth at bedtime as needed for anxiety.  . citalopram (CELEXA) 40 MG tablet Take 1 tablet (40 mg total) by mouth daily.  .  cyclobenzaprine (FLEXERIL) 10 MG tablet One half tab PO qHS, then increase gradually to one tab TID.  Marland Kitchen lamoTRIgine (LAMICTAL) 200 MG tablet Take 1 tablet (200 mg total) by mouth daily.  . Multiple Vitamin (MULTIVITAMIN) tablet Take 1 tablet by mouth daily.  . ondansetron (ZOFRAN ODT) 4 MG disintegrating tablet Take 1 tablet (4 mg total) by mouth every 6 (six) hours as needed for nausea or vomiting.  Marland Kitchen oxyCODONE-acetaminophen (PERCOCET/ROXICET) 5-325 MG tablet Take 2 tablets by mouth every 6 (six) hours as needed.  . Vilazodone HCl (VIIBRYD) 10 MG TABS Take 1 tablet (10 mg total) by mouth daily.  . vitamin B-12 (CYANOCOBALAMIN) 1000 MCG tablet Take 1,000 mcg by mouth daily.  . [DISCONTINUED] citalopram (CELEXA) 40 MG tablet Take 1 tablet (40 mg total) daily by mouth.  . [DISCONTINUED] lamoTRIgine (LAMICTAL) 200 MG tablet Take 1 tablet (200 mg total) daily by mouth.  . [DISCONTINUED] Vilazodone HCl (VIIBRYD) 10 MG TABS Take 1 tablet (10 mg total) daily by mouth.  Marland Kitchen BIOTIN PO Take 1 tablet by mouth daily.  Marland Kitchen CALCIUM PO Take 1 tablet by mouth daily.   Marland Kitchen levonorgestrel (MIRENA) 20 MCG/24HR IUD 1 Intra Uterine Device (1 each total) by Intrauterine route once.  . predniSONE (DELTASONE) 50 MG tablet One tab PO daily for 5 days. (Patient not taking: Reported on 10/26/2017)   No facility-administered  encounter medications on file as of 10/26/2017.      Family History; Family History  Problem Relation Age of Onset  . Hyperlipidemia Mother   . Sleep apnea Mother   . Depression Mother   . Diabetes Father   . Hypertension Father   . COPD Father   . Diabetes Paternal Aunt   . Diabetes Paternal Uncle   . Alcohol abuse Paternal Uncle   . Alcohol abuse Paternal Grandfather   . Depression Sister       Labs:  No results found for this or any previous visit (from the past 2160 hour(s)).      Mental Status Examination;   Psychiatric Specialty Exam: Physical Exam  Constitutional: She appears  well-developed and well-nourished. No distress.  Skin: She is not diaphoretic.    Review of Systems  Cardiovascular: Negative for chest pain.  Gastrointestinal: Negative for nausea.  Skin: Negative for rash.  Neurological: Negative for tremors and headaches.  Psychiatric/Behavioral: Negative for hallucinations.    Blood pressure 140/88, pulse 92, height 5\' 5"  (1.651 m), weight 217 lb (98.4 kg).Body mass index is 36.11 kg/m.  General Appearance: Casual  Eye Contact::  Fair  Speech:  Normal Rate  Volume:  Normal  Mood: fair  Affect:  Congruent  And reactive  Thought Process:  Coherent  Orientation:  Full (Time, Place, and Person)  Thought Content:  Rumination  Suicidal Thoughts:  No  Homicidal Thoughts:  No  Memory:  Immediate;   Fair Recent;   Fair  Judgement:  Fair  Insight:  Shallow  Psychomotor Activity:  Normal  Concentration:  Fair  Recall:  Fair  Akathisia:  Negative  Handed:  Right  AIMS (if indicated):     Assets:  Communication Skills Desire for Improvement Financial Resources/Insurance Housing  Sleep:        Assessment: Axis I: mood disorder unspecified. Rule out mood disorder secondary to general medical condition. Major depressive disorder recurrent moderate. Rule out panic disorder. Generalized anxiety disorder   Axis III:  Past Medical History:  Diagnosis Date  . Anxiety   . Arthritis    back and hips   . Depression   . Diabetes mellitus without complication ([place])   . GERD (gastroesophageal reflux disease)   . History of hiatal hernia   . HPV (human papilloma virus) infection   . Hyperlipidemia   . Hypothyroidism   . PONV (postoperative nausea and vomiting)    slow to wake up   . Shortness of breath dyspnea    with exertion   . Sleep apnea    cpap -0 setting at 16   . Stress incontinence     Axis IV: psychosocial   Treatment Plan and Summary:  Depression: improved. Continue meds  Anxiety: better. Continue celexa  Panic  attacks:infrequent. Takes prn xanax  Bariatric surgery: recovering and loosing weight.    Questions addressed. Fu 4 weeks or earlier if needed Merian Capron, MD 10/26/2017

## 2017-11-10 DIAGNOSIS — G4733 Obstructive sleep apnea (adult) (pediatric): Secondary | ICD-10-CM | POA: Diagnosis not present

## 2017-11-25 ENCOUNTER — Telehealth (HOSPITAL_COMMUNITY): Payer: Self-pay

## 2017-11-25 MED ORDER — ALPRAZOLAM 0.5 MG PO TABS: 0.5000 mg | ORAL_TABLET | Freq: Every evening | ORAL | 0 refills | Status: DC | PRN

## 2017-11-25 MED FILL — ALPRAZolam 0.5 MG TABS: 0.5 | 30 days supply | Qty: 30 | Fill #0

## 2017-11-25 MED FILL — lamoTRIgine 200 MG TABS: 200 | 30 days supply | Qty: 30 | Fill #1

## 2017-11-25 MED FILL — CITALOPRAM HBR 40 MG TABLET: 40 | 30 days supply | Qty: 30 | Fill #1

## 2017-11-25 NOTE — Telephone Encounter (Signed)
Left message, informing patient of refill. Nothing further is needed at this time.

## 2017-11-25 NOTE — Telephone Encounter (Signed)
Pharmacy sent fax requesting a refill on Alprazolam 0.5 mg. Last refill was on 10/06/17. Patient last office visit was on 10/26/17 with next visit on 01/20/18. Please review and advise.

## 2017-11-25 NOTE — Telephone Encounter (Signed)
Can send if patient needs refill

## 2017-11-26 IMAGING — DX DG CERVICAL SPINE COMPLETE 4+V
5 series · 5 of 5 positions shown · non-contrast
Comparison: None.

CLINICAL DATA: Neck stiffness and pain, no acute injury, initial
encounter

EXAM:
CERVICAL SPINE - COMPLETE 4+ VIEW

[c-spine lat]
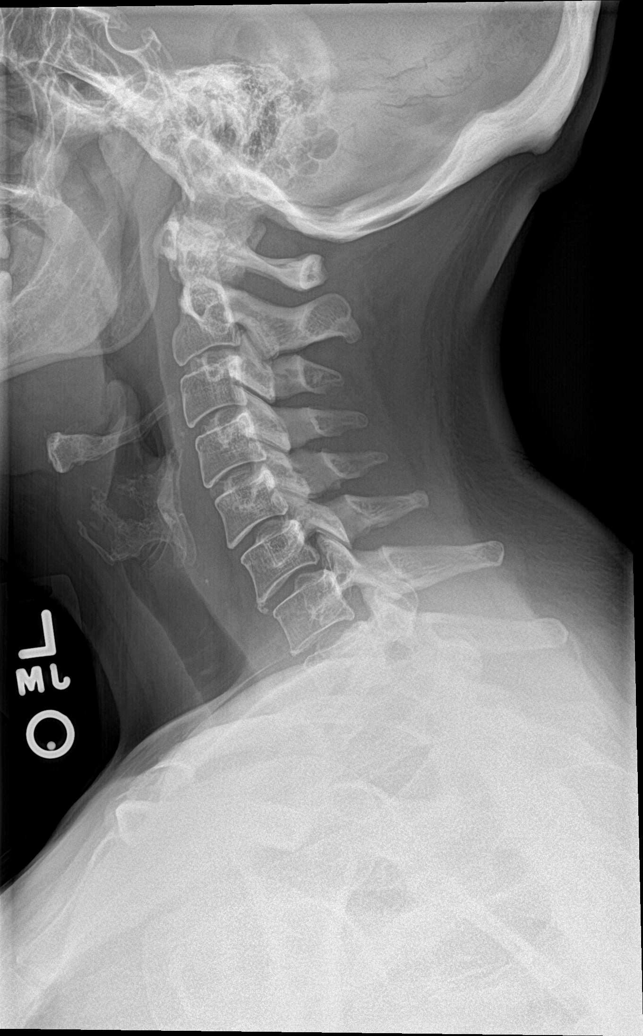

[c-spine obl (1 of 2)]
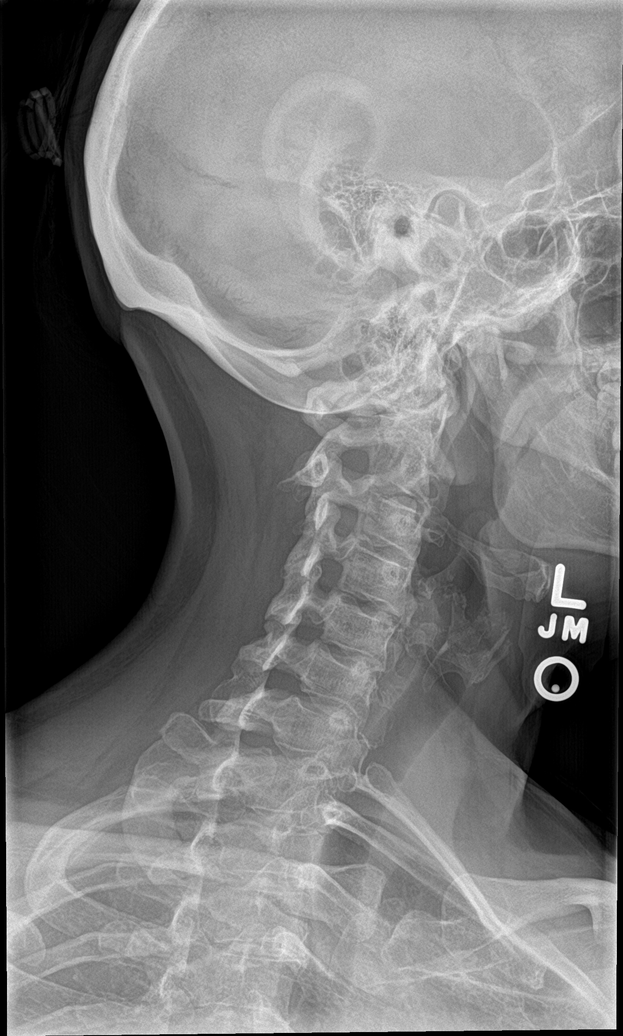

[c-spine obl (2 of 2)]
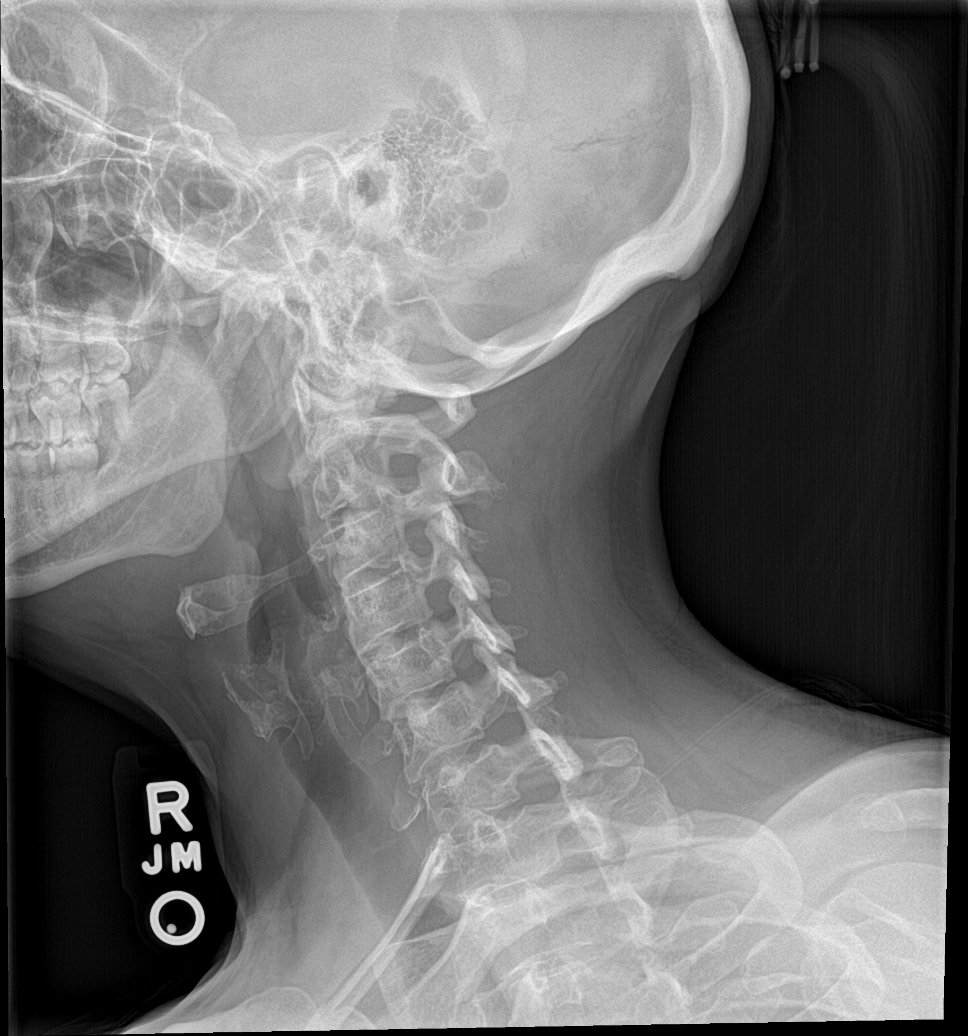

[c-spine ap]
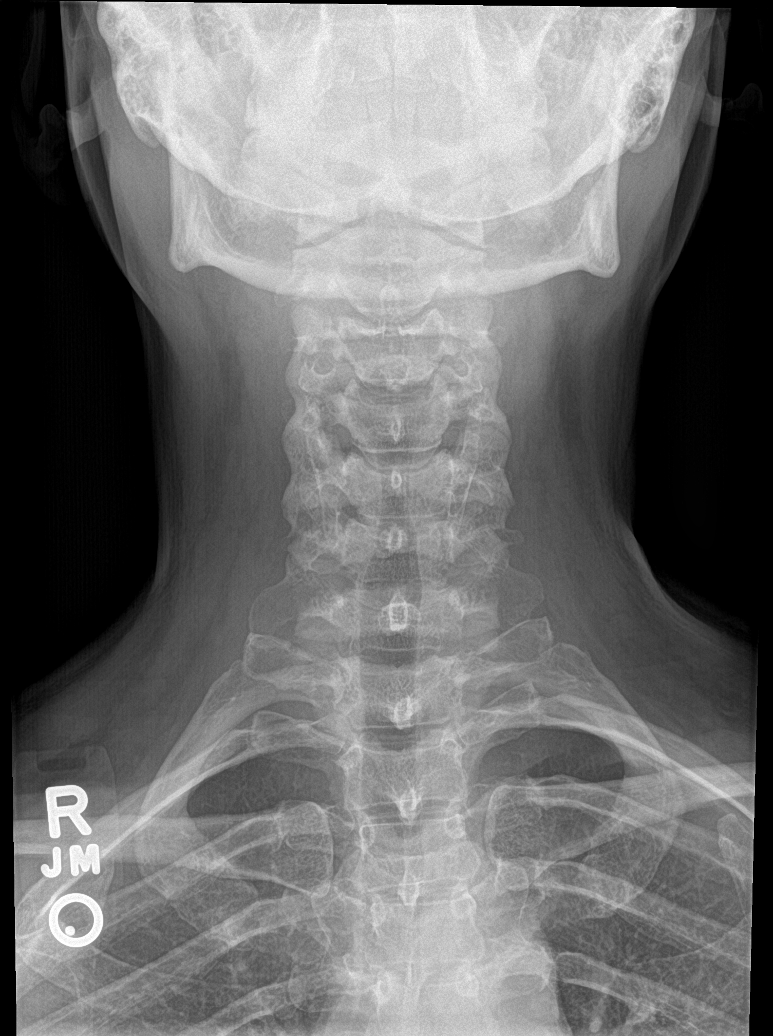

[c-spine open mouth]
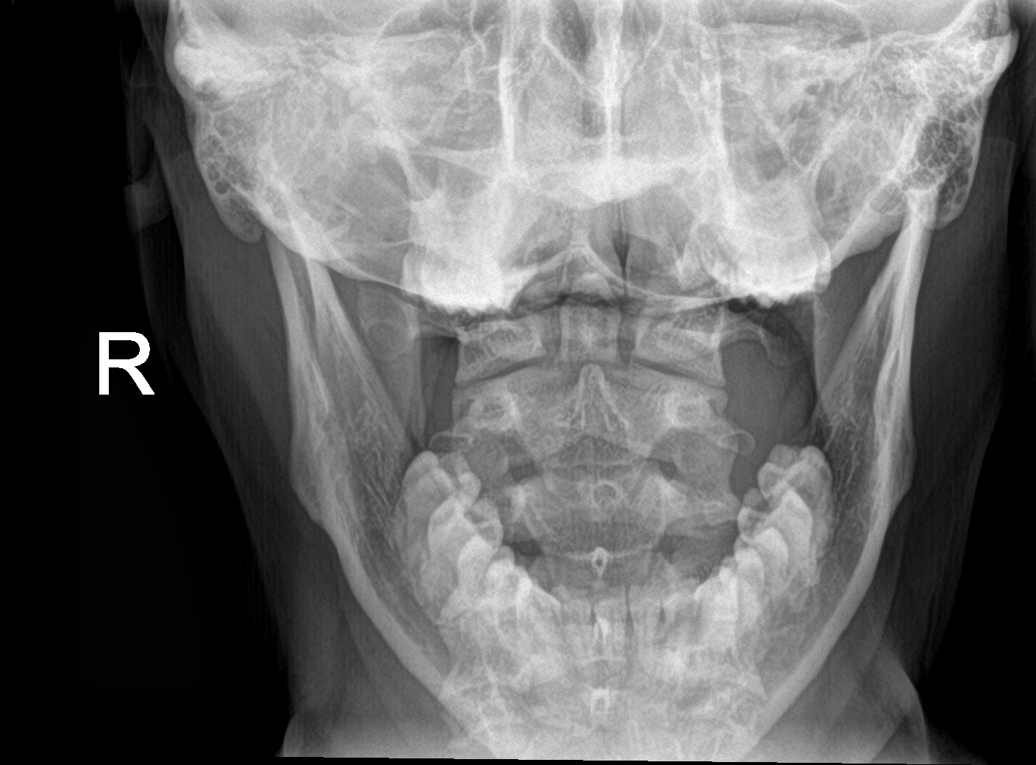

[5 of 5 positions shown; findings below may reference images not displayed]

FINDINGS: Seven cervical segments are well visualized. Vertebral body height
is well maintained. Mild degenerative anterolisthesis of C6 on C7 is
noted. Mild osteophytes are also noted at that level. No significant
neural foraminal changes are seen. No acute fracture or acute facet
abnormality is noted. The odontoid is within normal limits. No soft
tissue changes are noted.
IMPRESSION: Mild degenerative changes without acute abnormality.

## 2017-11-30 MED FILL — VIIBRYD 10 MG TABLET: 10 | 30 days supply | Qty: 30 | Fill #0

## 2018-01-11 MED FILL — lamoTRIgine 200 MG TABS: 200 | 30 days supply | Qty: 30 | Fill #0

## 2018-01-11 MED FILL — CITALOPRAM HBR 40 MG TABLET: 40 | 30 days supply | Qty: 30 | Fill #0

## 2018-01-11 MED FILL — VIIBRYD 10 MG TABLET: 10 | 30 days supply | Qty: 30 | Fill #1

## 2018-01-20 ENCOUNTER — Ambulatory Visit (HOSPITAL_COMMUNITY): Payer: Self-pay | Admitting: Psychiatry

## 2018-02-12 ENCOUNTER — Ambulatory Visit (HOSPITAL_COMMUNITY): Payer: Self-pay | Admitting: Psychiatry

## 2018-02-16 MED FILL — ALPRAZolam 0.5 MG TABS: 0.5 | 30 days supply | Qty: 30 | Fill #0

## 2018-02-19 ENCOUNTER — Other Ambulatory Visit: Payer: Self-pay | Admitting: Physician Assistant

## 2018-02-19 ENCOUNTER — Encounter (HOSPITAL_COMMUNITY): Payer: Self-pay | Admitting: Psychiatry

## 2018-02-19 ENCOUNTER — Ambulatory Visit (INDEPENDENT_AMBULATORY_CARE_PROVIDER_SITE_OTHER): Payer: 59 | Admitting: Psychiatry

## 2018-02-19 ENCOUNTER — Other Ambulatory Visit: Payer: Self-pay

## 2018-02-19 VITALS — BP 120/82 | HR 94 | Ht 65.0 in | Wt 217.0 lb

## 2018-02-19 DIAGNOSIS — Z818 Family history of other mental and behavioral disorders: Secondary | ICD-10-CM

## 2018-02-19 DIAGNOSIS — F0630 Mood disorder due to known physiological condition, unspecified: Secondary | ICD-10-CM | POA: Diagnosis not present

## 2018-02-19 DIAGNOSIS — F331 Major depressive disorder, recurrent, moderate: Secondary | ICD-10-CM | POA: Diagnosis not present

## 2018-02-19 DIAGNOSIS — F410 Panic disorder [episodic paroxysmal anxiety] without agoraphobia: Secondary | ICD-10-CM

## 2018-02-19 DIAGNOSIS — Z811 Family history of alcohol abuse and dependence: Secondary | ICD-10-CM | POA: Diagnosis not present

## 2018-02-19 DIAGNOSIS — F411 Generalized anxiety disorder: Secondary | ICD-10-CM | POA: Diagnosis not present

## 2018-02-19 DIAGNOSIS — Z1239 Encounter for other screening for malignant neoplasm of breast: Secondary | ICD-10-CM

## 2018-02-19 MED ORDER — CITALOPRAM HYDROBROMIDE 40 MG PO TABS: 40.0000 mg | ORAL_TABLET | Freq: Every day | ORAL | 2 refills | Status: DC

## 2018-02-19 MED ORDER — LAMOTRIGINE 200 MG PO TABS: 200.0000 mg | ORAL_TABLET | Freq: Every day | ORAL | 2 refills | Status: DC

## 2018-02-19 MED ORDER — VILAZODONE HCL 10 MG PO TABS: 10.0000 mg | ORAL_TABLET | Freq: Every day | ORAL | 2 refills | Status: DC

## 2018-02-19 MED FILL — CITALOPRAM HBR 40 MG TABLET: 40 | 30 days supply | Qty: 30 | Fill #0

## 2018-02-19 MED FILL — lamoTRIgine 200 MG TABS: 200 | 30 days supply | Qty: 30 | Fill #0

## 2018-02-19 NOTE — Progress Notes (Signed)
Patient ID: KEYLEEN CERRATO, female   DOB: 07-Sep-1966, 52 y.o.   MRN: [id number]  [facility] Follow up visit  AHNNA DUNGAN [id number] 52 y.o.  02/19/2018 8:46 AM  Chief Complaint:  Depression follow up  History of Present Illness:   Patient Presents for follow up and medication management for Major depression and Generalized anxiety disorder.   Dad in hospice care. She is feeling down due to that and her son is not making good grades  meds are helping but concern is her dad  No rash on lamictal viibryd has helped. 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  Prior Suicide Attempts: No  Medical History; Past Medical History:  Diagnosis Date  . Anxiety   . Arthritis    back and hips   . Depression   . Diabetes mellitus without complication ([place])   . GERD (gastroesophageal reflux disease)   . History of hiatal hernia   . HPV (human papilloma virus) infection   . Hyperlipidemia   . Hypothyroidism   . PONV (postoperative nausea and vomiting)    slow to wake up   . Shortness of breath dyspnea    with exertion   . Sleep apnea    cpap -0 setting at 16   . Stress incontinence     Allergies: Allergies  Allergen Reactions  . Food Swelling    Big Red Gum makes her tongue swell  . Latuda [Lurasidone Hcl] Other (See Comments)    Severe aggitation  . Lipitor [Atorvastatin] Other (See Comments)    Muscle aches  . Belviq [Lorcaserin Hcl]     Increased appetitie  . Contrave [Naltrexone-Bupropion Hcl Er] Other (See Comments)    Sleepy.    Medications: Outpatient Encounter Medications as of 02/19/2018  Medication Sig  . ALPRAZolam (XANAX) 0.5 MG tablet Take 1 tablet (0.5 mg total) by mouth at bedtime as needed for anxiety.  . citalopram (CELEXA) 40 MG tablet Take 1 tablet  (40 mg total) by mouth daily.  Marland Kitchen lamoTRIgine (LAMICTAL) 200 MG tablet Take 1 tablet (200 mg total) by mouth daily.  . Multiple Vitamin (MULTIVITAMIN) tablet Take 1 tablet by mouth daily.  . Vilazodone HCl (VIIBRYD) 10 MG TABS Take 1 tablet (10 mg total) by mouth daily.  . [DISCONTINUED] citalopram (CELEXA) 40 MG tablet Take 1 tablet (40 mg total) by mouth daily.  . [DISCONTINUED] lamoTRIgine (LAMICTAL) 200 MG tablet Take 1 tablet (200 mg total) by mouth daily.  . [DISCONTINUED] Vilazodone HCl (VIIBRYD) 10 MG TABS Take 1 tablet (10 mg total) by mouth daily.  Marland Kitchen BIOTIN PO Take 1 tablet by mouth daily.  Marland Kitchen CALCIUM PO Take 1 tablet by mouth daily.   . cyclobenzaprine (FLEXERIL) 10 MG tablet One half tab PO qHS, then increase gradually to one tab TID. (Patient not taking: Reported on 02/19/2018)  . levonorgestrel (MIRENA) 20 MCG/24HR IUD 1 Intra Uterine Device (1 each total) by Intrauterine route once.  . ondansetron (ZOFRAN ODT) 4 MG disintegrating tablet Take 1 tablet (4 mg total) by mouth every 6 (six) hours as needed for nausea or vomiting. (Patient not taking: Reported on 02/19/2018)  . oxyCODONE-acetaminophen (PERCOCET/ROXICET) 5-325 MG tablet Take 2 tablets by mouth every 6 (six) hours as needed. (Patient not taking: Reported on 02/19/2018)  .  predniSONE (DELTASONE) 50 MG tablet One tab PO daily for 5 days. (Patient not taking: Reported on 10/26/2017)  . vitamin B-12 (CYANOCOBALAMIN) 1000 MCG tablet Take 1,000 mcg by mouth daily.  . [DISCONTINUED] buPROPion (WELLBUTRIN SR) 100 MG 12 hr tablet Take 1 tablet (100 mg total) by mouth daily. (Patient not taking: Reported on 06/17/2017)   No facility-administered encounter medications on file as of 02/19/2018.      Family History; Family History  Problem Relation Age of Onset  . Hyperlipidemia Mother   . Sleep apnea Mother   . Depression Mother   . Diabetes Father   . Hypertension Father   . COPD Father   . Diabetes Paternal Aunt   . Diabetes Paternal  Uncle   . Alcohol abuse Paternal Uncle   . Alcohol abuse Paternal Grandfather   . Depression Sister       Labs:  No results found for this or any previous visit (from the past 2160 hour(s)).      Mental Status Examination;   Psychiatric Specialty Exam: Physical Exam  Constitutional: She appears well-developed and well-nourished. No distress.  Skin: She is not diaphoretic.    Review of Systems  Cardiovascular: Negative for chest pain.  Gastrointestinal: Negative for nausea.  Skin: Negative for rash.  Neurological: Negative for tremors and headaches.  Psychiatric/Behavioral: Positive for depression. Negative for hallucinations.    Blood pressure 120/82, pulse 94, height 5\' 5"  (1.651 m), weight 217 lb (98.4 kg).Body mass index is 36.11 kg/m.  General Appearance: Casual  Eye Contact::  Fair  Speech:  Normal Rate  Volume:  Normal  Mood: subdued  Affect:  Congruent  And reactive  Thought Process:  Coherent  Orientation:  Full (Time, Place, and Person)  Thought Content:  Rumination  Suicidal Thoughts:  No  Homicidal Thoughts:  No  Memory:  Immediate;   Fair Recent;   Fair  Judgement:  Fair  Insight:  Shallow  Psychomotor Activity:  Normal  Concentration:  Fair  Recall:  Fair  Akathisia:  Negative  Handed:  Right  AIMS (if indicated):     Assets:  Communication Skills Desire for Improvement Financial Resources/Insurance Housing  Sleep:        Assessment: Axis I: mood disorder unspecified. Rule out mood disorder secondary to general medical condition. Major depressive disorder recurrent moderate. Rule out panic disorder. Generalized anxiety disorder   Axis III:  Past Medical History:  Diagnosis Date  . Anxiety   . Arthritis    back and hips   . Depression   . Diabetes mellitus without complication ([place])   . GERD (gastroesophageal reflux disease)   . History of hiatal hernia   . HPV (human papilloma virus) infection   . Hyperlipidemia   .  Hypothyroidism   . PONV (postoperative nausea and vomiting)    slow to wake up   . Shortness of breath dyspnea    with exertion   . Sleep apnea    cpap -0 setting at 16   . Stress incontinence     Axis IV: psychosocial   Treatment Plan and Summary:  Depression: somewhat subdued due to dad. Would recommend therapy at hospice . Will keep meds same as this is external stress   Anxiety: fluctuates. Continue celexa   Panic attacks:infrequent. Takes prn xanax  Bariatric surgery: recovering and loosing weight.    fU 2-3 months.  Merian Capron, MD 02/19/2018

## 2018-02-25 ENCOUNTER — Ambulatory Visit (INDEPENDENT_AMBULATORY_CARE_PROVIDER_SITE_OTHER): Payer: 59

## 2018-02-25 DIAGNOSIS — Z1239 Encounter for other screening for malignant neoplasm of breast: Secondary | ICD-10-CM

## 2018-02-25 DIAGNOSIS — Z1231 Encounter for screening mammogram for malignant neoplasm of breast: Secondary | ICD-10-CM | POA: Diagnosis not present

## 2018-02-25 NOTE — Progress Notes (Signed)
Pt has seen results on MyChart and message also sent for patient to call back if any questions.

## 2018-02-25 NOTE — Progress Notes (Signed)
Call pt: normal mammogram. Follow up in 1 year.

## 2018-03-01 MED FILL — VIIBRYD 10 MG TABLET: 10 | 30 days supply | Qty: 30 | Fill #2

## 2018-03-09 ENCOUNTER — Encounter: Payer: Self-pay | Admitting: Physician Assistant

## 2018-03-24 ENCOUNTER — Ambulatory Visit (INDEPENDENT_AMBULATORY_CARE_PROVIDER_SITE_OTHER): Payer: 59 | Admitting: Physician Assistant

## 2018-03-24 ENCOUNTER — Encounter: Payer: Self-pay | Admitting: Physician Assistant

## 2018-03-24 VITALS — BP 133/59 | HR 81 | Ht 65.0 in | Wt 215.0 lb

## 2018-03-24 DIAGNOSIS — Z0000 Encounter for general adult medical examination without abnormal findings: Secondary | ICD-10-CM | POA: Diagnosis not present

## 2018-03-24 DIAGNOSIS — Z1211 Encounter for screening for malignant neoplasm of colon: Secondary | ICD-10-CM

## 2018-03-24 DIAGNOSIS — Z9989 Dependence on other enabling machines and devices: Secondary | ICD-10-CM

## 2018-03-24 DIAGNOSIS — G4733 Obstructive sleep apnea (adult) (pediatric): Secondary | ICD-10-CM | POA: Diagnosis not present

## 2018-03-24 DIAGNOSIS — F39 Unspecified mood [affective] disorder: Secondary | ICD-10-CM | POA: Diagnosis not present

## 2018-03-24 DIAGNOSIS — E119 Type 2 diabetes mellitus without complications: Secondary | ICD-10-CM | POA: Diagnosis not present

## 2018-03-24 DIAGNOSIS — D508 Other iron deficiency anemias: Secondary | ICD-10-CM | POA: Diagnosis not present

## 2018-03-24 DIAGNOSIS — Z79899 Other long term (current) drug therapy: Secondary | ICD-10-CM

## 2018-03-24 DIAGNOSIS — Z6835 Body mass index (BMI) 35.0-35.9, adult: Secondary | ICD-10-CM | POA: Diagnosis not present

## 2018-03-24 DIAGNOSIS — Z131 Encounter for screening for diabetes mellitus: Secondary | ICD-10-CM | POA: Diagnosis not present

## 2018-03-24 NOTE — Patient Instructions (Addendum)
Vitamin D 1000 units and 1066mcg b12.   Colonoscopy.   Keeping You Healthy  Get These Tests  Blood Pressure- Have your blood pressure checked by your healthcare provider at least once a year.  Normal blood pressure is 120/80.  Weight- Have your body mass index (BMI) calculated to screen for obesity.  BMI is a measure of body fat based on height and weight.  You can calculate your own BMI at [employer]  Cholesterol- Have your cholesterol checked every year.  Diabetes- Have your blood sugar checked every year if you have high blood pressure, high cholesterol, a family history of diabetes or if you are overweight.  Pap Test - Have a pap test every 1 to 5 years if you have been sexually active.  If you are older than 65 and recent pap tests have been normal you may not need additional pap tests.  In addition, if you have had a hysterectomy  for benign disease additional pap tests are not necessary.  Mammogram-Yearly mammograms are essential for early detection of breast cancer  Screening for Colon Cancer- Colonoscopy starting at age 7. Screening may begin sooner depending on your family history and other health conditions.  Follow up colonoscopy as directed by your Gastroenterologist.  Screening for Osteoporosis- Screening begins at age 50 with bone density scanning, sooner if you are at higher risk for developing Osteoporosis.  Get these medicines  Calcium with Vitamin D- Your body requires 1200-1500 mg of Calcium a day and [phone] IU of Vitamin D a day.  You can only absorb 500 mg of Calcium at a time therefore Calcium must be taken in 2 or 3 separate doses throughout the day.  Hormones- Hormone therapy has been associated with increased risk for certain cancers and heart disease.  Talk to your healthcare provider about if you need relief from menopausal symptoms.  Aspirin- Ask your healthcare provider about taking Aspirin to prevent Heart Disease and Stroke.  Get these  Immuniztions  Flu shot- Every fall  Pneumonia shot- Once after the age of 70; if you are younger ask your healthcare provider if you need a pneumonia shot.  Tetanus- Every ten years.  Zostavax- Once after the age of 69 to prevent shingles.  Take these steps  Don't smoke- Your healthcare provider can help you quit. For tips on how to quit, ask your healthcare provider or go to www.smokefree.gov or call 1-800 QUIT-NOW.  Be physically active- Exercise 5 days a week for a minimum of 30 minutes.  If you are not already physically active, start slow and gradually work up to 30 minutes of moderate physical activity.  Try walking, dancing, bike riding, swimming, etc.  Eat a healthy diet- Eat a variety of healthy foods such as fruits, vegetables, whole grains, low fat milk, low fat cheeses, yogurt, lean meats, chicken, fish, eggs, dried beans, tofu, etc.  For more information go to www.thenutritionsource.org  Dental visit- Brush and floss teeth twice daily; visit your dentist twice a year.  Eye exam- Visit your Optometrist or Ophthalmologist yearly.  Drink alcohol in moderation- Limit alcohol intake to one drink or less a day.  Never drink and drive.  Depression- Your emotional health is as important as your physical health.  If you're feeling down or losing interest in things you normally enjoy, please talk to your healthcare provider.  Seat Belts- can save your life; always wear one  Smoke/Carbon Monoxide detectors- These detectors need to be installed on the appropriate level of your  home.  Replace batteries at least once a year.  Violence- If anyone is threatening or hurting you, please tell your healthcare provider.  Living Will/ Health care power of attorney- Discuss with your healthcare provider and family.

## 2018-03-24 NOTE — Progress Notes (Signed)
Call pt: labs look great.

## 2018-03-24 NOTE — Progress Notes (Signed)
Subjective:     Shannon [name] is a 52 y.o. female and is here for a comprehensive physical exam. The patient reports she continues to struggle with her weight after gastric sleeve surgery. she has followed up with bariatric group and went to see a nutritionist but does not seem to be helping. she is stress eating a lot. she is not exercising.  she does wish she had more energy. No periods due to IUD. Uses CPAP nightly.    Social History   Socioeconomic History  . Marital status: Divorced    Spouse name: Not on file  . Number of children: Not on file  . Years of education: Not on file  . Highest education level: Not on file  Occupational History  . Not on file  Social Needs  . Financial resource strain: Not on file  . Food insecurity:    Worry: Not on file    Inability: Not on file  . Transportation needs:    Medical: Not on file    Non-medical: Not on file  Tobacco Use  . Smoking status: Never Smoker  . Smokeless tobacco: Never Used  Substance and Sexual Activity  . Alcohol use: No    Alcohol/week: 0.0 oz  . Drug use: No  . Sexual activity: Yes    Birth control/protection: Condom, IUD    Comment: IUD inserted 12/12/16  Lifestyle  . Physical activity:    Days per week: Not on file    Minutes per session: Not on file  . Stress: Not on file  Relationships  . Social connections:    Talks on phone: Not on file    Gets together: Not on file    Attends religious service: Not on file    Active member of club or organization: Not on file    Attends meetings of clubs or organizations: Not on file    Relationship status: Not on file  . Intimate partner violence:    Fear of current or ex partner: Not on file    Emotionally abused: Not on file    Physically abused: Not on file    Forced sexual activity: Not on file  Other Topics Concern  . Not on file  Social History Narrative  . Not on file   Health Maintenance  Topic Date Due  . OPHTHALMOLOGY EXAM  02/07/2015  . FOOT EXAM   12/29/2015  . URINE MICROALBUMIN  12/29/2015  . COLONOSCOPY  11/27/2017  . INFLUENZA VACCINE  05/20/2018  . HEMOGLOBIN A1C  09/23/2018  . MAMMOGRAM  02/26/2019  . PAP SMEAR  11/05/2019  . PNEUMOCOCCAL POLYSACCHARIDE VACCINE (2) 12/29/2019  . TETANUS/TDAP  01/08/2023  . HIV Screening  Completed    The following portions of the patient's history were reviewed and updated as appropriate: allergies, current medications, past family history, past medical history, past social history, past surgical history and problem list.  Review of Systems Pertinent items noted in HPI and remainder of comprehensive ROS otherwise negative.   Objective:    BP (!) 133/59   Pulse 81   Ht 5\' 5"  (1.651 m)   Wt 215 lb (97.5 kg)   BMI 35.78 kg/m  General appearance: alert, cooperative, appears stated age and moderately obese Head: Normocephalic, without obvious abnormality, atraumatic Eyes: conjunctivae/corneas clear. PERRL, EOM's intact. Fundi benign. Ears: normal TM's and external ear canals both ears Nose: Nares normal. Septum midline. Mucosa normal. No drainage or sinus tenderness. Throat: lips, mucosa, and tongue normal; teeth and  gums normal Neck: no adenopathy, no carotid bruit, no JVD, supple, symmetrical, trachea midline and thyroid not enlarged, symmetric, no tenderness/mass/nodules Back: symmetric, no curvature. ROM normal. No CVA tenderness. Lungs: clear to auscultation bilaterally Heart: regular rate and rhythm, S1, S2 normal, no murmur, click, rub or gallop Abdomen: soft, non-tender; bowel sounds normal; no masses,  no organomegaly Extremities: extremities normal, atraumatic, no cyanosis or edema Pulses: 2+ and symmetric Skin: Skin color, texture, turgor normal. No rashes or lesions Lymph nodes: Cervical, supraclavicular, and axillary nodes normal. Neurologic: Alert and oriented X 3, normal strength and tone. Normal symmetric reflexes. Normal coordination and gait    Assessment:     Healthy female exam.      Plan:    Marland KitchenMarland KitchenAmy was seen today for annual exam.  Diagnoses and all orders for this visit:  Routine physical examination -     CBC with Differential/Platelet -     COMPLETE METABOLIC PANEL WITH GFR -     Hemoglobin A1c -     Hemoglobin A1c  Iron deficiency anemia secondary to inadequate dietary iron intake -     CBC with Differential/Platelet  Medication management -     COMPLETE METABOLIC PANEL WITH GFR  Screening for diabetes mellitus -     Hemoglobin A1c  Mood disorder (HCC)  Class 2 severe obesity due to excess calories with serious comorbidity and body mass index (BMI) of 35.0 to 35.9 in adult ([place])  OSA on CPAP  Colon cancer screening -     Ambulatory referral to Gastroenterology  Controlled type 2 diabetes mellitus without complication, without long-term current use of insulin ([place])  .Marland Kitchen Discussed 150 minutes of exercise a week.  Encouraged vitamin D 1000 units and Calcium 1300mg  or 4 servings of dairy a day.  Mammogram and pap up to date.  Colonoscopy ordered today.  shingrix information and HO given.  Fasting labs ordered.   Mood is managed by [facility] downstairs.   Marland Kitchen.Discussed low carb diet with 1500 calories and 80g of protein.  Exercising at least 150 minutes a week.  [employer] could be a Microbiologist.    See After Visit Summary for Counseling Recommendations

## 2018-03-25 ENCOUNTER — Encounter: Payer: Self-pay | Admitting: Physician Assistant

## 2018-03-25 DIAGNOSIS — D508 Other iron deficiency anemias: Secondary | ICD-10-CM | POA: Insufficient documentation

## 2018-03-25 LAB — CBC WITH DIFFERENTIAL/PLATELET
BASOS PCT: 1 %
Basophils Absolute: 88 cells/uL (ref 0–200)
Eosinophils Absolute: 290 cells/uL (ref 15–500)
Eosinophils Relative: 3.3 %
HCT: 36.7 % (ref 35.0–45.0)
Hemoglobin: 11.6 g/dL — ABNORMAL LOW (ref 11.7–15.5)
Lymphs Abs: 3274 cells/uL (ref 850–3900)
MCH: 25.4 pg — ABNORMAL LOW (ref 27.0–33.0)
MCHC: 31.6 g/dL — ABNORMAL LOW (ref 32.0–36.0)
MCV: 80.5 fL (ref 80.0–100.0)
MONOS PCT: 6.6 %
MPV: 8.9 fL (ref 7.5–12.5)
NEUTROS ABS: 4567 {cells}/uL (ref 1500–7800)
Neutrophils Relative %: 51.9 %
Platelets: 494 10*3/uL — ABNORMAL HIGH (ref 140–400)
RBC: 4.56 10*6/uL (ref 3.80–5.10)
RDW: 14.9 % (ref 11.0–15.0)
TOTAL LYMPHOCYTE: 37.2 %
WBC: 8.8 10*3/uL (ref 3.8–10.8)
WBCMIX: 581 {cells}/uL (ref 200–950)

## 2018-03-25 LAB — COMPLETE METABOLIC PANEL WITH GFR
AG Ratio: 1.6 (calc) (ref 1.0–2.5)
ALT: 10 U/L (ref 6–29)
AST: 14 U/L (ref 10–35)
Albumin: 4.2 g/dL (ref 3.6–5.1)
Alkaline phosphatase (APISO): 83 U/L (ref 33–130)
BILIRUBIN TOTAL: 0.2 mg/dL (ref 0.2–1.2)
BUN: 16 mg/dL (ref 7–25)
CHLORIDE: 105 mmol/L (ref 98–110)
CO2: 27 mmol/L (ref 20–32)
Calcium: 9.3 mg/dL (ref 8.6–10.4)
Creat: 0.64 mg/dL (ref 0.50–1.05)
GFR, Est African American: 121 mL/min/{1.73_m2} (ref >=60)
GFR, Est Non African American: 104 mL/min/{1.73_m2} (ref >=60)
GLUCOSE: 96 mg/dL (ref 65–139)
Globulin: 2.6 g/dL (calc) (ref 1.9–3.7)
POTASSIUM: 4.8 mmol/L (ref 3.5–5.3)
Sodium: 139 mmol/L (ref 135–146)
Total Protein: 6.8 g/dL (ref 6.1–8.1)

## 2018-03-25 LAB — HEMOGLOBIN A1C
EAG (MMOL/L): 7 (calc)
HEMOGLOBIN A1C: 6 %{Hb} — AB (ref <5.7)
Mean Plasma Glucose: 126 (calc)

## 2018-03-25 NOTE — Progress Notes (Signed)
Hemoglobin coming up. a1c pending but should be good with the fasting glucose like you have.

## 2018-03-29 NOTE — Progress Notes (Signed)
Pt has seen results on MyChart.

## 2018-04-02 MED FILL — VIIBRYD 10 MG TABLET: 10 | 30 days supply | Qty: 30 | Fill #0

## 2018-04-02 MED FILL — CITALOPRAM HBR 40 MG TABLET: 40 | 30 days supply | Qty: 30 | Fill #1

## 2018-04-02 MED FILL — lamoTRIgine 200 MG TABS: 200 | 30 days supply | Qty: 30 | Fill #1

## 2018-04-19 ENCOUNTER — Emergency Department (HOSPITAL_COMMUNITY): Payer: 59

## 2018-04-19 ENCOUNTER — Other Ambulatory Visit: Payer: Self-pay

## 2018-04-19 ENCOUNTER — Emergency Department (HOSPITAL_COMMUNITY)
Admission: EM | Admit: 2018-04-19 | Discharge: 2018-04-20 | Disposition: A | Discharge status: Home / Self Care | Payer: 59 | Attending: Emergency Medicine | Admitting: Emergency Medicine

## 2018-04-19 DIAGNOSIS — R0789 Other chest pain: Secondary | ICD-10-CM | POA: Diagnosis not present

## 2018-04-19 DIAGNOSIS — Z79899 Other long term (current) drug therapy: Secondary | ICD-10-CM | POA: Diagnosis not present

## 2018-04-19 DIAGNOSIS — E039 Hypothyroidism, unspecified: Secondary | ICD-10-CM | POA: Diagnosis not present

## 2018-04-19 DIAGNOSIS — R079 Chest pain, unspecified: Secondary | ICD-10-CM | POA: Diagnosis not present

## 2018-04-19 DIAGNOSIS — E119 Type 2 diabetes mellitus without complications: Secondary | ICD-10-CM | POA: Insufficient documentation

## 2018-04-19 LAB — BASIC METABOLIC PANEL
Anion gap: 10 (ref 5–15)
BUN: 12 mg/dL (ref 6–20)
CALCIUM: 9 mg/dL (ref 8.9–10.3)
CO2: 26 mmol/L (ref 22–32)
CREATININE: 0.71 mg/dL (ref 0.44–1.00)
Chloride: 103 mmol/L (ref 98–111)
Glucose, Bld: 92 mg/dL (ref 70–99)
Potassium: 4.1 mmol/L (ref 3.5–5.1)
SODIUM: 139 mmol/L (ref 135–145)

## 2018-04-19 LAB — I-STAT TROPONIN, ED: TROPONIN I, POC: 0 ng/mL (ref 0.00–0.08)

## 2018-04-19 LAB — CBC
HCT: 39.7 % (ref 36.0–46.0)
HEMOGLOBIN: 12.4 g/dL (ref 12.0–15.0)
MCH: 26.1 pg (ref 26.0–34.0)
MCHC: 31.2 g/dL (ref 30.0–36.0)
MCV: 83.6 fL (ref 78.0–100.0)
PLATELETS: 531 10*3/uL — AB (ref 150–400)
RBC: 4.75 MIL/uL (ref 3.87–5.11)
RDW: 15.7 % — ABNORMAL HIGH (ref 11.5–15.5)
WBC: 9.5 10*3/uL (ref 4.0–10.5)

## 2018-04-19 LAB — I-STAT BETA HCG BLOOD, ED (MC, WL, AP ONLY)

## 2018-04-19 MED ORDER — SODIUM CHLORIDE 0.9 % IV BOLUS
500.0000 mL | Freq: Once | INTRAVENOUS | Status: AC
  Administered 2018-04-19: 500 mL via INTRAVENOUS

## 2018-04-19 MED ORDER — FAMOTIDINE IN NACL 20-0.9 MG/50ML-% IV SOLN
20.0000 mg | Freq: Once | INTRAVENOUS | Status: AC
  Administered 2018-04-19: 20 mg via INTRAVENOUS
  Filled 2018-04-19: qty 50

## 2018-04-19 MED ORDER — SUCRALFATE 1 GM/10ML PO SUSP
1.0000 g | Freq: Once | ORAL | Status: AC
  Administered 2018-04-19: 1 g via ORAL
  Filled 2018-04-19: qty 10

## 2018-04-19 MED ORDER — KETOROLAC TROMETHAMINE 30 MG/ML IJ SOLN
15.0000 mg | Freq: Once | INTRAMUSCULAR | Status: AC
  Administered 2018-04-19: 15 mg via INTRAVENOUS
  Filled 2018-04-19: qty 1

## 2018-04-19 NOTE — ED Triage Notes (Signed)
Pt reports she felt some chest pain and then started to feel nauseas and felt the pain runj up her jaw. Pt reports she also felt tired. Pt is pale and talking in complete sentences.

## 2018-04-19 NOTE — Discharge Instructions (Signed)
As discussed, your evaluation today has been largely reassuring.  But, it is important that you monitor your condition carefully, and do not hesitate to return to the ED if you develop new, or concerning changes in your condition. ? ?Otherwise, please follow-up with your physician for appropriate ongoing care. ? ?

## 2018-04-19 NOTE — ED Provider Notes (Signed)
[facility] DEPT Provider Note   CSN: [id number] Arrival date & time: 04/19/18  2111     History   Chief Complaint Chief Complaint  Patient presents with  . Chest Pain    HPI Shannon [name] is a 52 y.o. female.  HPI Patient presents with concern of jaw pain, chest pain. Patient was in her usual state of health when symptoms began initially with a jaw pain, then with chest pain and nausea. She now complains of headache, but states that the chest and jaw pain is resolved without intervention. Symptoms began less than 1 hour ago. Patient denies history of cardiac disease does have a history of gastric bypass. No recent medication change, diet change, activity change. She does have family members with cardiac disease.  She does not smoke.    Past Medical History:  Diagnosis Date  . Anxiety   . Arthritis    back and hips   . Depression   . Diabetes mellitus without complication ([place])   . GERD (gastroesophageal reflux disease)   . History of hiatal hernia   . HPV (human papilloma virus) infection   . Hyperlipidemia   . Hypothyroidism   . PONV (postoperative nausea and vomiting)    slow to wake up   . Shortness of breath dyspnea    with exertion   . Sleep apnea    cpap -0 setting at 16   . Stress incontinence     Patient Active Problem List   Diagnosis Date Noted  . Iron deficiency anemia secondary to inadequate dietary iron intake 03/25/2018  . 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 Fibroids 10/30/2016  . DDD (degenerative disc disease), cervical 07/31/2016  . Hiatal hernia 08/13/2015  . 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 Generalized anxiety disorder 03/16/2015  . DDD (degenerative disc disease), lumbar 08/28/2014  . Hypothyroidism 08/15/2014  . Diabetes mellitus type II, controlled ([place]) 08/15/2014  . OSA on CPAP 03/31/2014  . Abnormal weight gain 03/31/2014  . Sleep apnea 02/05/2012  . Mood disorder ([place]) 02/05/2012  .  Hyperlipidemia 11/22/2008    Past Surgical History:  Procedure Laterality Date  . APPENDECTOMY    . BREATH TEK H PYLORI N/A 04/03/2015   Procedure: BREATH TEK H PYLORI;  Surgeon: Greer Pickerel, MD;  Location: [facility];  Service: General;  Laterality: N/A;  . CESAREAN SECTION    . 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   second interspace, left foot  . feet surgery    . LAPAROSCOPIC GASTRIC SLEEVE RESECTION WITH HIATAL HERNIA REPAIR N/A 08/13/2015   Procedure: LAPAROSCOPIC GASTRIC SLEEVE RESECTION WITH HIATAL HERNIA REPAIR;  Surgeon: Greer Pickerel, MD;  Location: [facility];  Service: General;  Laterality: N/A;  . laproscopy    . UPPER GI ENDOSCOPY  08/13/2015   Procedure: UPPER GI ENDOSCOPY;  Surgeon: Greer Pickerel, MD;  Location: [facility];  Service: General;;  . WISDOM TOOTH EXTRACTION       OB History    Gravida  1   Para  1   Term  1   Preterm      AB      Living  1     SAB      TAB      Ectopic      Multiple      Live Births  Home Medications    Prior to Admission medications   Medication Sig Start Date End Date Taking? Authorizing Provider  ALPRAZolam Duanne Moron) 0.5 MG tablet Take 1 tablet (0.5 mg total) by mouth at bedtime as needed for anxiety. 11/25/17  Yes Merian Capron, MD  citalopram (CELEXA) 40 MG tablet Take 1 tablet (40 mg total) by mouth daily. 02/19/18  Yes Merian Capron, MD  Ferrous Sulfate (IRON) 325 (65 Fe) MG TABS Take 65 mg by mouth daily.   Yes [provider]  lamoTRIgine (LAMICTAL) 200 MG tablet Take 1 tablet (200 mg total) by mouth daily. 02/19/18  Yes Merian Capron, MD  levonorgestrel (MIRENA) 20 MCG/24HR IUD 1 Intra Uterine Device (1 each total) by Intrauterine route once. 01/14/17 04/19/18 Yes Dove, Myra C, MD  Vilazodone HCl (VIIBRYD) 10 MG TABS Take 1 tablet (10 mg total) by mouth daily. 02/19/18  Yes Merian Capron, MD    Family History Family  History  Problem Relation Age of Onset  . Hyperlipidemia Mother   . Sleep apnea Mother   . Depression Mother   . Diabetes Father   . Hypertension Father   . COPD Father   . Diabetes Paternal Aunt   . Diabetes Paternal Uncle   . Alcohol abuse Paternal Uncle   . Alcohol abuse Paternal Grandfather   . Depression Sister     Social History Social History   Tobacco Use  . Smoking status: Never Smoker  . Smokeless tobacco: Never Used  Substance Use Topics  . Alcohol use: No    Alcohol/week: 0.0 oz  . Drug use: No     Allergies   Food; Anette Guarneri [lurasidone hcl]; Lipitor [atorvastatin]; Belviq [lorcaserin hcl]; and Contrave [naltrexone-bupropion hcl er]   Review of Systems Review of Systems  Constitutional:       Per HPI, otherwise negative  HENT:       Per HPI, otherwise negative  Respiratory:       Per HPI, otherwise negative  Cardiovascular:       Per HPI, otherwise negative  Gastrointestinal: Negative for vomiting.  Endocrine:       Negative aside from HPI  Genitourinary:       Neg aside from HPI   Musculoskeletal:       Per HPI, otherwise negative  Skin: Negative.   Neurological: Negative for syncope.     Physical Exam Updated Vital Signs BP 116/67   Pulse 69   Temp 98.4 F (36.9 C) (Oral)   Resp 15   Ht 5\' 5"  (1.651 m)   Wt 97.5 kg (215 lb)   SpO2 97%   BMI 35.78 kg/m   Physical Exam  Constitutional: She is oriented to person, place, and time. She appears well-developed and well-nourished. No distress.  HENT:  Head: Normocephalic and atraumatic.  Eyes: Conjunctivae and EOM are normal.  Cardiovascular: Normal rate and regular rhythm.  Pulmonary/Chest: Effort normal and breath sounds normal. No stridor. No respiratory distress.  Abdominal: She exhibits no distension.  Minimal discomfort about the epigastrium and sternum  Musculoskeletal: She exhibits no edema.  Neurological: She is alert and oriented to person, place, and time. No cranial nerve  deficit.  Skin: Skin is warm and dry.  Psychiatric: She has a normal mood and affect.  Nursing note and vitals reviewed.    ED Treatments / Results  Labs (all labs ordered are listed, but only abnormal results are displayed) Labs Reviewed  CBC - Abnormal; Notable for the following components:  Result Value   RDW 15.7 (*)    Platelets 531 (*)    All other components within normal limits  BASIC METABOLIC PANEL  I-STAT TROPONIN, ED  I-STAT BETA HCG BLOOD, ED ([facility], [facility], [facility] ONLY)    EKG EKG Interpretation  Date/Time:  Monday April 19 2018 21:15:03 EDT Ventricular Rate:  89 PR Interval:    QRS Duration: 94 QT Interval:  346 QTC Calculation: 421 R Axis:   50 Text Interpretation:  Sinus rhythm Borderline T abnormalities, diffuse leads Baseline wander in lead(s) III T waves now downgoing in III. Q in III Confirmed by Davonna Belling [phone]) on 04/19/2018 9:18:50 PM   Radiology Dg Chest 2 View  Result Date: 04/19/2018 CLINICAL DATA:  Acute onset mid chest pain today. EXAM: CHEST - 2 VIEW COMPARISON:  PA and lateral chest 04/05/2015. FINDINGS: The lungs are clear. Heart size is normal. No pneumothorax or pleural effusion. No acute bony abnormality. IMPRESSION: Negative chest. Electronically Signed   By: Inge Rise M.D.   On: 04/19/2018 21:50    Procedures Procedures (including critical care time)  Medications Ordered in ED Medications  famotidine (PEPCID) IVPB 20 mg premix (0 mg Intravenous Stopped 04/19/18 2328)  sodium chloride 0.9 % bolus 500 mL (0 mLs Intravenous Stopped 04/19/18 2329)  sucralfate (CARAFATE) 1 GM/10ML suspension 1 g (1 g Oral Given 04/19/18 2235)     Initial Impression / Assessment and Plan / ED Course  I have reviewed the triage vital signs and the nursing notes.  Pertinent labs & imaging results that were available during my care of the patient were reviewed by me and considered in my medical decision making (see chart for details).     11:38  PM Patient's pain is improved substantially 3/10 now. We discussed initial findings including reassuring troponin, labs. EKG nonischemic, she remains hemodynamically unremarkable. Patient has a heart score of 3.  Without hypoxia, tachypnea tachycardia, low suspicion for PE. Patient is a non-peritoneal abdomen, low suspicion for acute abdominal processes.  Second troponin is pending. If the patient's second troponin is unremarkable, she may be appropriate for discharge with outpatient follow-up.    Final Clinical Impressions(s) / ED Diagnoses  Atypical chest pain   Carmin Muskrat, MD 04/19/18 2340

## 2018-04-19 NOTE — ED Notes (Signed)
Patient transported to [facility] 

## 2018-04-20 DIAGNOSIS — E039 Hypothyroidism, unspecified: Secondary | ICD-10-CM | POA: Diagnosis not present

## 2018-04-20 DIAGNOSIS — R0789 Other chest pain: Secondary | ICD-10-CM | POA: Diagnosis not present

## 2018-04-20 DIAGNOSIS — Z79899 Other long term (current) drug therapy: Secondary | ICD-10-CM | POA: Diagnosis not present

## 2018-04-20 DIAGNOSIS — E119 Type 2 diabetes mellitus without complications: Secondary | ICD-10-CM | POA: Diagnosis not present

## 2018-04-20 LAB — I-STAT TROPONIN, ED: Troponin i, poc: 0.01 ng/mL (ref 0.00–0.08)

## 2018-04-20 NOTE — ED Provider Notes (Signed)
Assumed care from Dr. Vanita Panda at midnight.  Patient presented with atypical chest pain.  Likely GI related.  EKG reassuring.  First troponin negative.  Plan for delta troponin and discharge if negative.  Delta troponin negative.  The patient is safe for discharge with strict return precautions.   Disposition: Discharge  Condition: Good  I have discussed the results, Dx and Tx plan with the patient who expressed understanding and agree(s) with the plan. Discharge instructions discussed at great length. The patient was given strict return precautions who verbalized understanding of the instructions. No further questions at time of discharge.    ED Discharge Orders    None       Follow Up: [address] [place] [id number] [phone]  Schedule an appointment as soon as possible for a visit        Cardama, Grayce Sessions, MD 04/20/18 [phone]

## 2018-05-02 ENCOUNTER — Emergency Department (INDEPENDENT_AMBULATORY_CARE_PROVIDER_SITE_OTHER)
Admission: EM | Admit: 2018-05-02 | Discharge: 2018-05-02 | Disposition: A | Discharge status: Home / Self Care | Payer: 59 | Source: Home / Self Care

## 2018-05-02 ENCOUNTER — Encounter: Payer: Self-pay | Admitting: Emergency Medicine

## 2018-05-02 ENCOUNTER — Other Ambulatory Visit: Payer: Self-pay

## 2018-05-02 DIAGNOSIS — R42 Dizziness and giddiness: Secondary | ICD-10-CM

## 2018-05-02 MED ORDER — MECLIZINE HCL 25 MG PO TABS: 25.0000 mg | ORAL_TABLET | Freq: Three times a day (TID) | ORAL | 2 refills | Status: DC | PRN

## 2018-05-02 NOTE — Discharge Instructions (Signed)
Schedule to see Shannon [name] for recheck in 3-4 days

## 2018-05-02 NOTE — ED Triage Notes (Signed)
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 Has not fallen but feels unsteady in waves of dizziness; denies nausea.

## 2018-05-03 NOTE — ED Provider Notes (Signed)
[facility]    CSN: [id number] Arrival date & time: 05/02/18  1302     History   Chief Complaint Chief Complaint  Patient presents with  . Dizziness    HPI Shannon [name] is a 52 y.o. female.   The history is provided by the patient. No language interpreter was used.  Dizziness  Quality:  Lightheadedness and vertigo Severity:  Moderate Onset quality:  Gradual Duration:  2 days Timing:  Constant Progression:  Worsening Context: not with inactivity   Relieved by:  Nothing Worsened by:  Nothing Ineffective treatments:  None tried Associated symptoms: no tinnitus and no vision changes   Risk factors: no multiple medications    Pt has had vertigo in the past.  Pt reports symptoms are the same.  Pt is out of meclizine.  Past Medical History:  Diagnosis Date  . Anxiety   . Arthritis    back and hips   . Depression   . Diabetes mellitus without complication ([place])   . GERD (gastroesophageal reflux disease)   . History of hiatal hernia   . HPV (human papilloma virus) infection   . Hyperlipidemia   . Hypothyroidism   . PONV (postoperative nausea and vomiting)    slow to wake up   . Shortness of breath dyspnea    with exertion   . Sleep apnea    cpap -0 setting at 16   . Stress incontinence     Patient Active Problem List   Diagnosis Date Noted  . Iron deficiency anemia secondary to inadequate dietary iron intake 03/25/2018  . 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 Fibroids 10/30/2016  . DDD (degenerative disc disease), cervical 07/31/2016  . Hiatal hernia 08/13/2015  . 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 Generalized anxiety disorder 03/16/2015  . DDD (degenerative disc disease), lumbar 08/28/2014  . Hypothyroidism 08/15/2014  . Diabetes mellitus type II, controlled ([place]) 08/15/2014  . OSA on CPAP 03/31/2014  . Abnormal weight gain 03/31/2014  . Sleep apnea 02/05/2012  . Mood disorder ([place]) 02/05/2012  . Hyperlipidemia 11/22/2008    Past  Surgical History:  Procedure Laterality Date  . APPENDECTOMY    . BREATH TEK H PYLORI N/A 04/03/2015   Procedure: BREATH TEK H PYLORI;  Surgeon: Greer Pickerel, MD;  Location: [facility];  Service: General;  Laterality: N/A;  . CESAREAN SECTION    . 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   second interspace, left foot  . feet surgery    . LAPAROSCOPIC GASTRIC SLEEVE RESECTION WITH HIATAL HERNIA REPAIR N/A 08/13/2015   Procedure: LAPAROSCOPIC GASTRIC SLEEVE RESECTION WITH HIATAL HERNIA REPAIR;  Surgeon: Greer Pickerel, MD;  Location: [facility];  Service: General;  Laterality: N/A;  . laproscopy    . UPPER GI ENDOSCOPY  08/13/2015   Procedure: UPPER GI ENDOSCOPY;  Surgeon: Greer Pickerel, MD;  Location: [facility];  Service: General;;  . WISDOM TOOTH EXTRACTION      OB History    Gravida  1   Para  1   Term  1   Preterm      AB      Living  1     SAB      TAB      Ectopic      Multiple      Live Births               Home Medications  Prior to Admission medications   Medication Sig Start Date End Date Taking? Authorizing Provider  ALPRAZolam Duanne Moron) 0.5 MG tablet Take 1 tablet (0.5 mg total) by mouth at bedtime as needed for anxiety. 11/25/17   Merian Capron, MD  citalopram (CELEXA) 40 MG tablet Take 1 tablet (40 mg total) by mouth daily. 02/19/18   Merian Capron, MD  Ferrous Sulfate (IRON) 325 (65 Fe) MG TABS Take 65 mg by mouth daily.    [provider]  lamoTRIgine (LAMICTAL) 200 MG tablet Take 1 tablet (200 mg total) by mouth daily. 02/19/18   Merian Capron, MD  levonorgestrel (MIRENA) 20 MCG/24HR IUD 1 Intra Uterine Device (1 each total) by Intrauterine route once. 01/14/17 04/19/18  Emily Filbert, MD  meclizine (ANTIVERT) 25 MG tablet Take 1 tablet (25 mg total) by mouth 3 (three) times daily as needed for dizziness. 05/02/18   Fransico Meadow, PA-C  Vilazodone HCl (VIIBRYD) 10 MG TABS Take 1  tablet (10 mg total) by mouth daily. 02/19/18   Merian Capron, MD    Family History Family History  Problem Relation Age of Onset  . Hyperlipidemia Mother   . Sleep apnea Mother   . Depression Mother   . Diabetes Father   . Hypertension Father   . COPD Father   . Diabetes Paternal Aunt   . Diabetes Paternal Uncle   . Alcohol abuse Paternal Uncle   . Alcohol abuse Paternal Grandfather   . Depression Sister     Social History Social History   Tobacco Use  . Smoking status: Never Smoker  . Smokeless tobacco: Never Used  Substance Use Topics  . Alcohol use: No    Alcohol/week: 0.0 oz  . Drug use: No     Allergies   Food; Anette Guarneri [lurasidone hcl]; Lipitor [atorvastatin]; Belviq [lorcaserin hcl]; and Contrave [naltrexone-bupropion hcl er]   Review of Systems Review of Systems  HENT: Negative for tinnitus.   Neurological: Positive for dizziness.  All other systems reviewed and are negative.    Physical Exam Triage Vital Signs ED Triage Vitals  Enc Vitals Group     BP --      Pulse Rate 05/02/18 1401 90     Resp --      Temp 05/02/18 1401 98.1 F (36.7 C)     Temp Source 05/02/18 1401 Oral     SpO2 05/02/18 1401 98 %     Weight 05/02/18 1403 215 lb (97.5 kg)     Height 05/02/18 1403 5\' 5"  (1.651 m)     Head Circumference --      Peak Flow --      Pain Score 05/02/18 1403 0     Pain Loc --      Pain Edu? --      Excl. in [place]? --    Orthostatic VS for the past 24 hrs:  BP- Lying Pulse- Lying BP- Sitting Pulse- Sitting BP- Standing at 0 minutes Pulse- Standing at 0 minutes  05/02/18 1401 132/84 90 133/80 100 124/82 105    Updated Vital Signs Pulse 90   Temp 98.1 F (36.7 C) (Oral)   Ht 5\' 5"  (1.651 m)   Wt 215 lb (97.5 kg)   SpO2 98%   BMI 35.78 kg/m   Visual Acuity Right Eye Distance:   Left Eye Distance:   Bilateral Distance:    Right Eye Near:   Left Eye Near:    Bilateral Near:     Physical Exam  Constitutional: She is oriented to person,  place, and time. She appears well-developed and well-nourished.  HENT:  Head: Normocephalic.  Right Ear: External ear normal.  Left Ear: External ear normal.  Eyes: Pupils are equal, round, and reactive to light. Conjunctivae and EOM are normal.  Right lateral nystagmus   Neck: Normal range of motion.  Cardiovascular: Normal rate.  Pulmonary/Chest: Effort normal.  Abdominal: Soft. She exhibits no distension.  Musculoskeletal: Normal range of motion.  Neurological: She is alert and oriented to person, place, and time.  Skin: Skin is warm.  Psychiatric: She has a normal mood and affect.  Nursing note and vitals reviewed.    UC Treatments / Results  Labs (all labs ordered are listed, but only abnormal results are displayed) Labs Reviewed - No data to display  EKG None  Radiology No results found.  Procedures Procedures (including critical care time)  Medications Ordered in UC Medications - No data to display  Initial Impression / Assessment and Plan / UC Course  I have reviewed the triage vital signs and the nursing notes.  Pertinent labs & imaging results that were available during my care of the patient were reviewed by me and considered in my medical decision making (see chart for details).     Pt advised to schedule to see Luvenia Starch for evaltuison Rx for antivert Final Clinical Impressions(s) / UC Diagnoses   Final diagnoses:  Vertigo     Discharge Instructions     Schedule to see Luvenia Starch for recheck in 3-4 days   ED Prescriptions    Medication Sig Dispense Auth. Provider   meclizine (ANTIVERT) 25 MG tablet Take 1 tablet (25 mg total) by mouth 3 (three) times daily as needed for dizziness. 30 tablet Fransico Meadow, [place]     Controlled Substance Prescriptions [place] Controlled Substance Registry consulted? Not Applicable  An After Visit Summary was printed and given to the patient.    Fransico Meadow, [place] 05/03/18 2841

## 2018-05-07 ENCOUNTER — Encounter: Payer: Self-pay | Admitting: Physician Assistant

## 2018-05-07 ENCOUNTER — Ambulatory Visit (INDEPENDENT_AMBULATORY_CARE_PROVIDER_SITE_OTHER): Payer: 59 | Admitting: Physician Assistant

## 2018-05-07 VITALS — BP 134/62 | HR 82 | Ht 65.0 in | Wt 214.0 lb

## 2018-05-07 DIAGNOSIS — H8111 Benign paroxysmal vertigo, right ear: Secondary | ICD-10-CM | POA: Diagnosis not present

## 2018-05-07 DIAGNOSIS — R079 Chest pain, unspecified: Secondary | ICD-10-CM | POA: Diagnosis not present

## 2018-05-07 NOTE — Progress Notes (Signed)
Subjective:    Patient ID: Shannon [name], female    DOB: [birth year], 52 y.o.   MRN: [id number]  HPI  Pt is a 52 yr old female status w/ a hx of sleeve gastrectomy who presents to the clinic after her urgent care (7/14) visit for vertigo. She says she is doing okay. Her vertigo is still present and she was found to have BPPV. She has tried to do Epley maneuvers at home on her own and that does improve her overall symptoms but still continues to make her very dizzy in the meantime. She was discharged with [facility] which she says is helping. She mentioned that a friend told her Vitamin E and Folic acid helped her and that she may consider trying that.  She also went to to the ED 7/01 for chest pain. She has had a negative cardiac workup and was told it was due to esophageal spasms. She denies any acid reflux symptoms. She has not had any chest pain since.   .. Active Ambulatory Problems    Diagnosis Date Noted  . Hyperlipidemia 11/22/2008  . Sleep apnea 02/05/2012  . Mood disorder ([place]) 02/05/2012  . OSA on CPAP 03/31/2014  . Abnormal weight gain 03/31/2014  . Hypothyroidism 08/15/2014  . Diabetes mellitus type II, controlled ([place]) 08/15/2014  . DDD (degenerative disc disease), lumbar 08/28/2014  . Generalized anxiety disorder 03/16/2015  . Hiatal hernia 08/13/2015  . 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 DDD (degenerative disc disease), cervical 07/31/2016  . Fibroids 10/30/2016  . 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 Iron deficiency anemia secondary to inadequate dietary iron intake 03/25/2018   Resolved Ambulatory Problems    Diagnosis Date Noted  . Unspecified otitis media 11/22/2008  . Acute sinusitis, unspecified 11/22/2008  . URI 09/28/2009  . BACK PAIN, LUMBAR 12/26/2008  . Obesity 03/31/2014  . Burning sensation of the foot 08/28/2014  . Numbness of both lower extremities 08/28/2014  . BMI 38.0-38.9,adult 12/29/2014  . Status post gastric surgery 11/12/2015   Past  Medical History:  Diagnosis Date  . Anxiety   . Arthritis   . Depression   . Diabetes mellitus without complication ([place])   . GERD (gastroesophageal reflux disease)   . History of hiatal hernia   . HPV (human papilloma virus) infection   . Hyperlipidemia   . Hypothyroidism   . PONV (postoperative nausea and vomiting)   . Shortness of breath dyspnea   . Sleep apnea   . Stress incontinence      Review of Systems  Constitutional: Negative for chills, fatigue and fever.  HENT: Negative.   Eyes: Negative.   Respiratory: Negative for cough and shortness of breath.   Cardiovascular: Negative.  Negative for chest pain and palpitations.  Neurological: Negative for weakness, numbness and headaches.       + vertigo  All other systems reviewed and are negative.      Objective:   Physical Exam  Constitutional: She appears well-developed and well-nourished. No distress.  HENT:  Head: Normocephalic and atraumatic.  Eyes: Pupils are equal, round, and reactive to light. Conjunctivae and EOM are normal.  Neck: Normal range of motion.  Cardiovascular: Normal rate, regular rhythm and normal heart sounds.  Pulmonary/Chest: Effort normal and breath sounds normal.  Neurological: She is alert.  Skin: Skin is warm and dry.       Assessment & Plan:  Marland KitchenMarland KitchenDiagnoses and all orders for this visit:  BPPV (benign paroxysmal positional vertigo), right  Chest  pain, unspecified type  -If she is still having vertigo to let us know my Monday and that she may benefit from Vestibular Rehab. Pt told to continue taking Meclizine and continue to do epley manuvers 3 reps at least 3 times a day. Pt told that she may try Folic acid and Vitamin E and to let us know if this is helping. -Her CP has gone away. This is likely esophageal spasms. Pt told we can try Zantac or possible muscle relaxers for this. Pt currently is asymptomatic but will follow up. Watch for food triggers.   Follow up with any new CP.    Marland KitchenVernetta Honey PA-C, have reviewed and agree with the above documentation in it's entirety.

## 2018-05-09 DIAGNOSIS — H8111 Benign paroxysmal vertigo, right ear: Secondary | ICD-10-CM | POA: Insufficient documentation

## 2018-05-09 DIAGNOSIS — R079 Chest pain, unspecified: Secondary | ICD-10-CM | POA: Insufficient documentation

## 2018-05-13 DIAGNOSIS — Z1211 Encounter for screening for malignant neoplasm of colon: Secondary | ICD-10-CM | POA: Diagnosis not present

## 2018-05-13 DIAGNOSIS — D124 Benign neoplasm of descending colon: Secondary | ICD-10-CM | POA: Diagnosis not present

## 2018-05-13 DIAGNOSIS — D120 Benign neoplasm of cecum: Secondary | ICD-10-CM | POA: Diagnosis not present

## 2018-05-13 DIAGNOSIS — D123 Benign neoplasm of transverse colon: Secondary | ICD-10-CM | POA: Diagnosis not present

## 2018-05-13 DIAGNOSIS — K5730 Diverticulosis of large intestine without perforation or abscess without bleeding: Secondary | ICD-10-CM | POA: Diagnosis not present

## 2018-05-13 LAB — HM COLONOSCOPY

## 2018-05-14 ENCOUNTER — Ambulatory Visit (HOSPITAL_COMMUNITY): Payer: 59 | Admitting: Psychiatry

## 2018-05-14 ENCOUNTER — Other Ambulatory Visit (HOSPITAL_COMMUNITY): Payer: Self-pay | Admitting: Psychiatry

## 2018-05-14 MED FILL — CITALOPRAM HBR 40 MG TABLET: 40 | 30 days supply | Qty: 30 | Fill #2

## 2018-05-14 MED FILL — VIIBRYD 10 MG TABLET: 10 | 30 days supply | Qty: 30 | Fill #1

## 2018-05-14 MED FILL — lamoTRIgine 200 MG TABS: 200 | 30 days supply | Qty: 30 | Fill #2

## 2018-05-19 ENCOUNTER — Ambulatory Visit (HOSPITAL_COMMUNITY): Payer: 59 | Admitting: Psychiatry

## 2018-05-21 DIAGNOSIS — K635 Polyp of colon: Secondary | ICD-10-CM | POA: Insufficient documentation

## 2018-05-24 ENCOUNTER — Telehealth (HOSPITAL_COMMUNITY): Payer: Self-pay

## 2018-05-24 MED ORDER — ALPRAZOLAM 0.5 MG PO TABS: 0.5000 mg | ORAL_TABLET | Freq: Every day | ORAL | 0 refills | Status: DC | PRN

## 2018-05-24 MED FILL — ALPRAZolam 0.5 MG TABS: 0.5 | 30 days supply | Qty: 30 | Fill #0

## 2018-05-24 NOTE — Telephone Encounter (Signed)
Pharmacy called asking for a refill on Alprazolam. [facility]

## 2018-05-24 NOTE — Telephone Encounter (Signed)
sent 

## 2018-05-28 ENCOUNTER — Other Ambulatory Visit: Payer: Self-pay

## 2018-05-28 ENCOUNTER — Ambulatory Visit (INDEPENDENT_AMBULATORY_CARE_PROVIDER_SITE_OTHER): Payer: 59 | Admitting: Psychiatry

## 2018-05-28 ENCOUNTER — Encounter (HOSPITAL_COMMUNITY): Payer: Self-pay | Admitting: Psychiatry

## 2018-05-28 VITALS — BP 116/72 | HR 81 | Ht 65.0 in | Wt 215.0 lb

## 2018-05-28 DIAGNOSIS — F411 Generalized anxiety disorder: Secondary | ICD-10-CM

## 2018-05-28 DIAGNOSIS — Z818 Family history of other mental and behavioral disorders: Secondary | ICD-10-CM | POA: Diagnosis not present

## 2018-05-28 DIAGNOSIS — F410 Panic disorder [episodic paroxysmal anxiety] without agoraphobia: Secondary | ICD-10-CM | POA: Diagnosis not present

## 2018-05-28 DIAGNOSIS — F331 Major depressive disorder, recurrent, moderate: Secondary | ICD-10-CM

## 2018-05-28 DIAGNOSIS — Z811 Family history of alcohol abuse and dependence: Secondary | ICD-10-CM | POA: Diagnosis not present

## 2018-05-28 DIAGNOSIS — F0630 Mood disorder due to known physiological condition, unspecified: Secondary | ICD-10-CM

## 2018-05-28 MED ORDER — VILAZODONE HCL 10 MG PO TABS: 10.0000 mg | ORAL_TABLET | Freq: Every day | ORAL | 2 refills | Status: DC

## 2018-05-28 MED ORDER — LAMOTRIGINE 200 MG PO TABS: 200.0000 mg | ORAL_TABLET | Freq: Every day | ORAL | 2 refills | Status: DC

## 2018-05-28 MED ORDER — CITALOPRAM HYDROBROMIDE 40 MG PO TABS: 40.0000 mg | ORAL_TABLET | Freq: Every day | ORAL | 2 refills | Status: DC

## 2018-05-28 NOTE — Progress Notes (Signed)
Patient ID: Shannon [name], female   DOB: 09/03/1966, 52 y.o.   MRN: [id number]  [facility] up visit  Shannon [name] [id number] 52 y.o.  05/28/2018 12:29 PM  Chief Complaint:  Depression follow up  History of Present Illness:   Patient Presents for follow up and medication management for Major depression and Generalized anxiety disorder.  Doing better regarding depression. Son going to 8th grade Having neck pain so got MRi has bulge disc planning for injections  No rash. Anxiety manageable   viibryd has helped. 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  Prior Suicide Attempts: No  Medical History; Past Medical History:  Diagnosis Date  . Anxiety   . Arthritis    back and hips   . Depression   . Diabetes mellitus without complication ([place])   . GERD (gastroesophageal reflux disease)   . History of hiatal hernia   . HPV (human papilloma virus) infection   . Hyperlipidemia   . Hypothyroidism   . PONV (postoperative nausea and vomiting)    slow to wake up   . Shortness of breath dyspnea    with exertion   . Sleep apnea    cpap -0 setting at 16   . Stress incontinence     Allergies: Allergies  Allergen Reactions  . Food Swelling    Big Red Gum makes her tongue swell  . Latuda [Lurasidone Hcl] Other (See Comments)    Severe aggitation  . Lipitor [Atorvastatin] Other (See Comments)    Muscle aches  . Belviq [Lorcaserin Hcl]     Increased appetitie  . Contrave [Naltrexone-Bupropion Hcl Er] Other (See Comments)    Sleepy.    Medications: Outpatient Encounter Medications as of 05/28/2018  Medication Sig  . ALPRAZolam (XANAX) 0.5 MG tablet Take 1 tablet (0.5 mg total) by mouth daily as needed for anxiety.  . citalopram (CELEXA) 40 MG tablet Take 1 tablet  (40 mg total) by mouth daily.  . Ferrous Sulfate (IRON) 325 (65 Fe) MG TABS Take 65 mg by mouth daily.  Marland Kitchen lamoTRIgine (LAMICTAL) 200 MG tablet Take 1 tablet (200 mg total) by mouth daily.  . meclizine (ANTIVERT) 25 MG tablet Take 1 tablet (25 mg total) by mouth 3 (three) times daily as needed for dizziness.  . Vilazodone HCl (VIIBRYD) 10 MG TABS Take 1 tablet (10 mg total) by mouth daily.  . [DISCONTINUED] citalopram (CELEXA) 40 MG tablet Take 1 tablet (40 mg total) by mouth daily.  . [DISCONTINUED] lamoTRIgine (LAMICTAL) 200 MG tablet Take 1 tablet (200 mg total) by mouth daily.  . [DISCONTINUED] Vilazodone HCl (VIIBRYD) 10 MG TABS Take 1 tablet (10 mg total) by mouth daily.  Marland Kitchen levonorgestrel (MIRENA) 20 MCG/24HR IUD 1 Intra Uterine Device (1 each total) by Intrauterine route once.   No facility-administered encounter medications on file as of 05/28/2018.      Family History; Family History  Problem Relation Age of Onset  . Hyperlipidemia Mother   . Sleep apnea Mother   . Depression Mother   . Diabetes Father   . Hypertension Father   . COPD Father   . Diabetes Paternal Aunt   . Diabetes Paternal Uncle   . Alcohol abuse Paternal Uncle   . Alcohol abuse Paternal Grandfather   .  Depression Sister       Labs:  Recent Results (from the past 2160 hour(s))  CBC with Differential/Platelet     Status: Abnormal   Collection Time: 03/24/18  9:20 AM  Result Value Ref Range   WBC 8.8 3.8 - 10.8 Thousand/uL   RBC 4.56 3.80 - 5.10 Million/uL   Hemoglobin 11.6 (L) 11.7 - 15.5 g/dL   HCT 36.7 35.0 - 45.0 %   MCV 80.5 80.0 - 100.0 fL   MCH 25.4 (L) 27.0 - 33.0 pg   MCHC 31.6 (L) 32.0 - 36.0 g/dL   RDW 14.9 11.0 - 15.0 %   Platelets 494 (H) 140 - 400 Thousand/uL   MPV 8.9 7.5 - 12.5 fL   Neutro Abs 4,567 1,500 - 7,800 cells/uL   Lymphs Abs 3,274 850 - 3,900 cells/uL   WBC mixed population 581 200 - 950 cells/uL   Eosinophils Absolute 290 15 - 500 cells/uL   Basophils Absolute 88 0 -  200 cells/uL   Neutrophils Relative % 51.9 %   Total Lymphocyte 37.2 %   Monocytes Relative 6.6 %   Eosinophils Relative 3.3 %   Basophils Relative 1.0 %  COMPLETE METABOLIC PANEL WITH GFR     Status: None   Collection Time: 03/24/18  9:20 AM  Result Value Ref Range   Glucose, Bld 96 65 - 139 mg/dL    Comment: .        Non-fasting reference interval .    BUN 16 7 - 25 mg/dL   Creat 0.64 0.50 - 1.05 mg/dL    Comment: For patients >50 years of age, the reference limit for Creatinine is approximately 13% higher for people identified as African-American. .    GFR, Est Non African American 104 > OR = 60 mL/min/1.82m   GFR, Est African American 121 > OR = 60 mL/min/1.777m  BUN/Creatinine Ratio NOT APPLICABLE 6 - 22 (calc)   Sodium 139 135 - 146 mmol/L   Potassium 4.8 3.5 - 5.3 mmol/L   Chloride 105 98 - 110 mmol/L   CO2 27 20 - 32 mmol/L   Calcium 9.3 8.6 - 10.4 mg/dL   Total Protein 6.8 6.1 - 8.1 g/dL   Albumin 4.2 3.6 - 5.1 g/dL   Globulin 2.6 1.9 - 3.7 g/dL (calc)   AG Ratio 1.6 1.0 - 2.5 (calc)   Total Bilirubin 0.2 0.2 - 1.2 mg/dL   Alkaline phosphatase (APISO) 83 33 - 130 U/L   AST 14 10 - 35 U/L   ALT 10 6 - 29 U/L  Hemoglobin A1c     Status: Abnormal   Collection Time: 03/24/18  9:20 AM  Result Value Ref Range   Hgb A1c MFr Bld 6.0 (H) <5.7 % of total Hgb    Comment: For someone without known diabetes, a hemoglobin  A1c value between 5.7% and 6.4% is consistent with prediabetes and should be confirmed with a  follow-up test. . For someone with known diabetes, a value <7% indicates that their diabetes is well controlled. A1c targets should be individualized based on duration of diabetes, age, comorbid conditions, and other considerations. . This assay result is consistent with an increased risk of diabetes. . Currently, no consensus exists regarding use of hemoglobin A1c for diagnosis of diabetes for children. .    Mean Plasma Glucose 126 (calc)   eAG  (mmol/L) 7.0 (calc)  Basic metabolic panel     Status: None   Collection Time: 04/19/18  9:20 PM  Result Value Ref Range   Sodium 139 135 - 145 mmol/L   Potassium 4.1 3.5 - 5.1 mmol/L   Chloride 103 98 - 111 mmol/L    Comment: Please note change in reference range.   CO2 26 22 - 32 mmol/L   Glucose, Bld 92 70 - 99 mg/dL    Comment: Please note change in reference range.   BUN 12 6 - 20 mg/dL    Comment: Please note change in reference range.   Creatinine, Ser 0.71 0.44 - 1.00 mg/dL   Calcium 9.0 8.9 - 10.3 mg/dL   GFR calc non Af Amer >60 >60 mL/min   GFR calc Af Amer >60 >60 mL/min    Comment: (NOTE) The eGFR has been calculated using the CKD EPI equation. This calculation has not been validated in all clinical situations. eGFR's persistently <60 mL/min signify possible Chronic Kidney Disease.    Anion gap 10 5 - 15    Comment: Performed at [facility], [place] [address] [place], [place] [address]  CBC     Status: Abnormal   Collection Time: 04/19/18  9:20 PM  Result Value Ref Range   WBC 9.5 4.0 - 10.5 K/uL   RBC 4.75 3.87 - 5.11 MIL/uL   Hemoglobin 12.4 12.0 - 15.0 g/dL   HCT 39.7 36.0 - 46.0 %   MCV 83.6 78.0 - 100.0 fL   MCH 26.1 26.0 - 34.0 pg   MCHC 31.2 30.0 - 36.0 g/dL   RDW 15.7 (H) 11.5 - 15.5 %   Platelets 531 (H) 150 - 400 K/uL    Comment: Performed at [facility], [place] [address], [place], [place] [address]  I-stat troponin, ED     Status: None   Collection Time: 04/19/18  9:35 PM  Result Value Ref Range   Troponin i, poc 0.00 0.00 - 0.08 ng/mL   Comment 3            Comment: Due to the release kinetics of cTnI, a negative result within the first hours of the onset of symptoms does not rule out myocardial infarction with certainty. If myocardial infarction is still suspected, repeat the test at appropriate intervals.   I-Stat beta hCG blood, ED     Status: None   Collection Time: 04/19/18  9:36 PM  Result Value Ref  Range   I-stat hCG, quantitative <5.0 <5 mIU/mL   Comment 3            Comment:   GEST. AGE      CONC.  (mIU/mL)   <=1 WEEK        5 - 50     2 WEEKS       50 - 500     3 WEEKS       100 - 10,000     4 WEEKS     1,000 - 30,000        FEMALE AND NON-PREGNANT FEMALE:     LESS THAN 5 mIU/mL   I-stat troponin, ED     Status: None   Collection Time: 04/20/18 12:29 AM  Result Value Ref Range   Troponin i, poc 0.01 0.00 - 0.08 ng/mL   Comment 3            Comment: Due to the release kinetics of cTnI, a negative result within the first hours of the onset of symptoms does not rule out myocardial infarction with certainty. If myocardial infarction is still suspected,  repeat the test at appropriate intervals.         Mental Status Examination;   Psychiatric Specialty Exam: Physical Exam  Constitutional: She appears well-developed and well-nourished. No distress.  Skin: She is not diaphoretic.    Review of Systems  Cardiovascular: Negative for palpitations.  Gastrointestinal: Negative for nausea.  Musculoskeletal: Positive for neck pain.  Skin: Negative for rash.  Neurological: Negative for tremors and headaches.  Psychiatric/Behavioral: Negative for hallucinations.    Blood pressure 116/72, pulse 81, height 5' 5"  (1.651 m), weight 215 lb (97.5 kg).Body mass index is 35.78 kg/m.  General Appearance: Casual  Eye Contact::  Fair  Speech:  Normal Rate  Volume:  Normal  Mood: fair  Affect:  Congruent  And reactive  Thought Process:  Coherent  Orientation:  Full (Time, Place, and Person)  Thought Content:  Rumination  Suicidal Thoughts:  No  Homicidal Thoughts:  No  Memory:  Immediate;   Fair Recent;   Fair  Judgement:  Fair  Insight:  Shallow  Psychomotor Activity:  Normal  Concentration:  Fair  Recall:  Fair  Akathisia:  Negative  Handed:  Right  AIMS (if indicated):     Assets:  Communication Skills Desire for Improvement Financial Resources/Insurance Housing   Sleep:        Assessment: Axis I: mood disorder unspecified. Rule out mood disorder secondary to general medical condition. Major depressive disorder recurrent moderate. Rule out panic disorder. Generalized anxiety disorder   Axis III:  Past Medical History:  Diagnosis Date  . Anxiety   . Arthritis    back and hips   . Depression   . Diabetes mellitus without complication ([place])   . GERD (gastroesophageal reflux disease)   . History of hiatal hernia   . HPV (human papilloma virus) infection   . Hyperlipidemia   . Hypothyroidism   . PONV (postoperative nausea and vomiting)    slow to wake up   . Shortness of breath dyspnea    with exertion   . Sleep apnea    cpap -0 setting at 16   . Stress incontinence     Axis IV: psychosocial   Treatment Plan and Summary:  Depression: fair. Continue celexa, lamictal, vibryd Anxiety: fluctuates. Continue celexa   Panic attacks:infrequent. Takes prn xanax  Bariatric surgery: recovering and loosing weight.   Fu with primary care regarding neck pain fU 2-3 months.  Merian Capron, MD 05/28/2018

## 2018-06-23 ENCOUNTER — Telehealth: Payer: Self-pay | Admitting: Physician Assistant

## 2018-06-23 DIAGNOSIS — M5030 Other cervical disc degeneration, unspecified cervical region: Secondary | ICD-10-CM

## 2018-06-23 NOTE — Telephone Encounter (Signed)
Pt called and states that Dr.T gave her a referral for her DDD to [facility]. In October but pt could not afford it at that time so will you place another PT referral because she is now able to go Thanks

## 2018-06-23 NOTE — Telephone Encounter (Signed)
Routing for order.

## 2018-06-23 NOTE — Telephone Encounter (Signed)
Referral placed.

## 2018-06-24 ENCOUNTER — Ambulatory Visit (INDEPENDENT_AMBULATORY_CARE_PROVIDER_SITE_OTHER): Payer: 59 | Admitting: Rehabilitative and Restorative Service Providers"

## 2018-06-24 ENCOUNTER — Encounter: Payer: Self-pay | Admitting: Family Medicine

## 2018-06-24 ENCOUNTER — Encounter: Payer: Self-pay | Admitting: Physician Assistant

## 2018-06-24 ENCOUNTER — Encounter: Payer: Self-pay | Admitting: Rehabilitative and Restorative Service Providers"

## 2018-06-24 ENCOUNTER — Ambulatory Visit (INDEPENDENT_AMBULATORY_CARE_PROVIDER_SITE_OTHER): Payer: 59 | Admitting: Family Medicine

## 2018-06-24 VITALS — BP 134/76 | HR 89 | Temp 97.4°F | Wt 215.8 lb

## 2018-06-24 DIAGNOSIS — M62838 Other muscle spasm: Secondary | ICD-10-CM

## 2018-06-24 DIAGNOSIS — R29898 Other symptoms and signs involving the musculoskeletal system: Secondary | ICD-10-CM | POA: Diagnosis not present

## 2018-06-24 DIAGNOSIS — M542 Cervicalgia: Secondary | ICD-10-CM

## 2018-06-24 DIAGNOSIS — R293 Abnormal posture: Secondary | ICD-10-CM

## 2018-06-24 MED ORDER — PREDNISONE 50 MG PO TABS: 50.0000 mg | ORAL_TABLET | Freq: Every day | ORAL | 0 refills | Status: DC

## 2018-06-24 MED ORDER — BACLOFEN 10 MG PO TABS: 10.0000 mg | ORAL_TABLET | Freq: Three times a day (TID) | ORAL | 2 refills | Status: DC | PRN
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
## 2018-06-24 NOTE — Progress Notes (Signed)
Shannon [name] is a 52 y.o. female who presents to [facility] today for neck pain.  Shannon [name] notes a several day history of worsening bilateral posterior neck pain.  She notes pain to neck at the base of the skull down into the upper thoracic back.  She denies any radiating pain weakness or numbness.  She has pain is worse with neck motion.  She had a similar episode of pain in October 2018 that was well treated with physical therapy.  She contacted her PCP and my partner Dr. Dianah [name] who previously treated her for these issues and she is already had one session with physical therapy.  She notes this is been slightly helpful but it is too early to tell.  She thinks that she would benefit from a course of prednisone and a muscle relaxer as this is helped previously.  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  She works in the evenings.  She is worried about missing work because of occurrences.  She notes that she occasionally has flareups of her neck pain and sometimes has doctor. visits to go to for her neck pain and wonders about FMLA.   ROS:  As above  Exam:  BP 134/76   Pulse 89   Temp (!) 97.4 F (36.3 C) (Oral)   Wt 215 lb 12.8 oz (97.9 kg)   BMI 35.91 kg/m  General: Well Developed, well nourished, and in no acute distress.  Neuro/Psych: Alert and oriented x3, extra-ocular muscles intact, able to move all 4 extremities, sensation grossly intact. Skin: Warm and dry, no rashes noted.  Respiratory: Not using accessory muscles, speaking in full sentences, trachea midline.  Cardiovascular: Pulses palpable, no extremity edema. Abdomen: Does not appear distended. MSK:  C-spine: Nontender to spinal midline.  Tender palpation bilateral cervical and upper thoracic paraspinal muscle. Decreased cervical motion due to pain. Upper extremity strength reflexes and sensation are equal normal  throughout. Pulses intact upper extremities    Lab and Radiology Results EXAM: MRI CERVICAL SPINE WITHOUT CONTRAST  TECHNIQUE: Multiplanar, multisequence MR imaging of the cervical spine was performed. No intravenous contrast was administered.  COMPARISON:  Cervical spine radiographs 07/31/2016.  FINDINGS: Alignment: Normal.  Vertebrae: No acute or suspicious osseous findings.  Cord: Normal in signal and caliber.  Posterior Fossa, vertebral arteries, paraspinal tissues: Visualized portions of the posterior fossa and paraspinal soft tissues appear unremarkable. Bilateral vertebral artery flow voids.  Disc levels:  C2-3: Mild uncinate spurring asymmetric to the left. No spinal stenosis or nerve root encroachment.  C3-4:  No significant findings.  C4-5:  No significant findings.  C5-6:  No significant findings.  C6-7: Mild disc bulging and uncinate spurring asymmetric to the right. No spinal stenosis or nerve root encroachment.  C7-T1:  Normal interspace.  IMPRESSION: 1. Minimal spondylosis. 2. No disc herniation, spinal stenosis or nerve root encroachment.   Electronically Signed   By: Shannon [name] M.D.   On: 08/10/2017 16:56 I personally (independently) visualized and performed the interpretation of the images attached in this note.      Assessment and Plan: 52 y.o. female with cervical neck pain very likely muscle spasm dysfunction.  Physical therapy is most likely beneficial treatment option.  [name] continue with physical therapy previously ordered.  Additionally will prescribe short course of prednisone and baclofen.  Avoid baclofen while at work as  this may make her sleepy.  Recheck as needed.  Consider facet or epidural steroid injections in the future if needed. New issue for me today uncertain prognosis.  We will complete FMLA paperwork if needed.  No orders of the defined types were placed in this encounter.  Meds ordered this  encounter  Medications  . baclofen (LIORESAL) 10 MG tablet    Sig: Take 1 tablet (10 mg total) by mouth 3 (three) times daily as needed for muscle spasms.    Dispense:  45 each    Refill:  2  . predniSONE (DELTASONE) 50 MG tablet    Sig: Take 1 tablet (50 mg total) by mouth daily.    Dispense:  5 tablet    Refill:  0    Historical information moved to improve visibility of documentation.  Past Medical History:  Diagnosis Date  . Anxiety   . Arthritis    back and hips   . Depression   . Diabetes mellitus without complication ([place])   . GERD (gastroesophageal reflux disease)   . History of hiatal hernia   . HPV (human papilloma virus) infection   . Hyperlipidemia   . Hypothyroidism   . PONV (postoperative nausea and vomiting)    slow to wake up   . Shortness of breath dyspnea    with exertion   . Sleep apnea    cpap -0 setting at 16   . Stress incontinence    Past Surgical History:  Procedure Laterality Date  . APPENDECTOMY    . BREATH TEK H PYLORI N/A 04/03/2015   Procedure: BREATH TEK H PYLORI;  Surgeon: Greer Pickerel, MD;  Location: [facility];  Service: General;  Laterality: N/A;  . CESAREAN SECTION    . 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   second interspace, left foot  . feet surgery    . LAPAROSCOPIC GASTRIC SLEEVE RESECTION WITH HIATAL HERNIA REPAIR N/A 08/13/2015   Procedure: LAPAROSCOPIC GASTRIC SLEEVE RESECTION WITH HIATAL HERNIA REPAIR;  Surgeon: Greer Pickerel, MD;  Location: [facility];  Service: General;  Laterality: N/A;  . laproscopy    . UPPER GI ENDOSCOPY  08/13/2015   Procedure: UPPER GI ENDOSCOPY;  Surgeon: Greer Pickerel, MD;  Location: [facility];  Service: General;;  . WISDOM TOOTH EXTRACTION     Social History   Tobacco Use  . Smoking status: Never Smoker  . Smokeless tobacco: Never Used  Substance Use Topics  . Alcohol use: No    Alcohol/week: 0.0 standard drinks   family  history includes Alcohol abuse in her paternal grandfather and paternal uncle; COPD in her father; Depression in her mother and sister; Diabetes in her father, paternal aunt, and paternal uncle; Hyperlipidemia in her mother; Hypertension in her father; Sleep apnea in her mother.  Medications: Current Outpatient Medications  Medication Sig Dispense Refill  . ALPRAZolam (XANAX) 0.5 MG tablet Take 1 tablet (0.5 mg total) by mouth daily as needed for anxiety. 30 tablet 0  . citalopram (CELEXA) 40 MG tablet Take 1 tablet (40 mg total) by mouth daily. 30 tablet 2  . Ferrous Sulfate (IRON) 325 (65 Fe) MG TABS Take 65 mg by mouth daily.    Marland Kitchen lamoTRIgine (LAMICTAL) 200 MG tablet Take 1 tablet (200 mg total) by mouth daily. 30 tablet 2  . meclizine (ANTIVERT) 25 MG tablet Take 1 tablet (25 mg total) by mouth 3 (three) times daily as needed for  dizziness. 30 tablet 2  . Vilazodone HCl (VIIBRYD) 10 MG TABS Take 1 tablet (10 mg total) by mouth daily. 30 tablet 2  . baclofen (LIORESAL) 10 MG tablet Take 1 tablet (10 mg total) by mouth 3 (three) times daily as needed for muscle spasms. 45 each 2  . levonorgestrel (MIRENA) 20 MCG/24HR IUD 1 Intra Uterine Device (1 each total) by Intrauterine route once. 1 each 0  . predniSONE (DELTASONE) 50 MG tablet Take 1 tablet (50 mg total) by mouth daily. 5 tablet 0   No current facility-administered medications for this visit.    Allergies  Allergen Reactions  . Food Swelling    Big Red Gum makes her tongue swell  . Latuda [Lurasidone Hcl] Other (See Comments)    Severe aggitation  . Lipitor [Atorvastatin] Other (See Comments)    Muscle aches  . Belviq [Lorcaserin Hcl]     Increased appetitie  . Contrave [Naltrexone-Bupropion Hcl Er] Other (See Comments)    Sleepy.      Discussed warning signs or symptoms. Please see discharge instructions. Patient expresses understanding.

## 2018-06-24 NOTE — Telephone Encounter (Signed)
Pt advised.

## 2018-06-24 NOTE — Patient Instructions (Signed)
Access Code: [email or web address]  URL: [email or web address]  Date: 06/24/2018  Prepared by: Gillermo Murdoch   Exercises  Seated Cervical Retraction - 10 reps - 1 sets - 3x daily - 7x weekly  Standing Scapular Retraction - 10 reps - 1 sets - 10 hold - 3x daily - 7x weekly  Doorway Pec Stretch at 60 Degrees Abduction - 3 reps - 1 sets - 3x daily - 7x weekly  Doorway Pec Stretch at 90 Degrees Abduction - 3 reps - 1 sets - 30 seconds hold - 3x daily - 7x weekly  Doorway Pec Stretch at 120 Degrees Abduction - 3 reps - 1 sets - 30 second hold hold - 3x daily - 7x weekly  Patient Education  TENS UNIT

## 2018-06-24 NOTE — Patient Instructions (Signed)
Thank you for coming in today. Continue PT.  Max dose of aleve is 2 twice daily . Max dose of tylenol is 1000mg  every 6 hours.  Take prednisone for 5 days. This replaces aleve.  Use Baclofen muscle relaxer as needed. This may make you sleepy. Do not take at work.  Use a heating pad.    TENS UNIT: This is helpful for muscle pain and spasm.   Search and Purchase a TENS 7000 2nd edition at  www.tenspros.com or www.[employer].com It should be less than $30.     TENS unit instructions: Do not shower or bathe with the unit on Turn the unit off before removing electrodes or batteries If the electrodes lose stickiness add a drop of water to the electrodes after they are disconnected from the unit and place on plastic sheet. If you continued to have difficulty, call the TENS unit company to purchase more electrodes. Do not apply lotion on the skin area prior to use. Make sure the skin is clean and dry as this will help prolong the life of the electrodes. After use, always check skin for unusual red areas, rash or other skin difficulties. If there are any skin problems, does not apply electrodes to the same area. Never remove the electrodes from the unit by pulling the wires. Do not use the TENS unit or electrodes other than as directed. Do not change electrode placement without consultating your therapist or physician. Keep 2 fingers with between each electrode. Wear time ratio is 2:1, on to off times.    For example on for 30 minutes off for 15 minutes and then on for 30 minutes off for 15 minutes

## 2018-06-25 ENCOUNTER — Encounter: Payer: Self-pay | Admitting: Physical Therapy

## 2018-06-25 ENCOUNTER — Ambulatory Visit (INDEPENDENT_AMBULATORY_CARE_PROVIDER_SITE_OTHER): Payer: 59 | Admitting: Physical Therapy

## 2018-06-25 DIAGNOSIS — R293 Abnormal posture: Secondary | ICD-10-CM

## 2018-06-25 DIAGNOSIS — M542 Cervicalgia: Secondary | ICD-10-CM | POA: Diagnosis not present

## 2018-06-25 DIAGNOSIS — R29898 Other symptoms and signs involving the musculoskeletal system: Secondary | ICD-10-CM | POA: Diagnosis not present
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
## 2018-06-30 ENCOUNTER — Encounter: Payer: Self-pay | Admitting: Physical Therapy

## 2018-06-30 ENCOUNTER — Ambulatory Visit (INDEPENDENT_AMBULATORY_CARE_PROVIDER_SITE_OTHER): Payer: 59 | Admitting: Physical Therapy

## 2018-06-30 DIAGNOSIS — R29898 Other symptoms and signs involving the musculoskeletal system: Secondary | ICD-10-CM

## 2018-06-30 DIAGNOSIS — R42 Dizziness and giddiness: Secondary | ICD-10-CM

## 2018-06-30 DIAGNOSIS — R293 Abnormal posture: Secondary | ICD-10-CM

## 2018-06-30 DIAGNOSIS — M542 Cervicalgia: Secondary | ICD-10-CM | POA: Diagnosis not present

## 2018-06-30 NOTE — Patient Instructions (Signed)
Access Code: [email or web address]  URL: [email or web address]  Date: 06/30/2018  Prepared by: Faustino Congress   Exercises  Seated Cervical Retraction - 10 reps - 1 sets - 3x daily - 7x weekly  Standing Scapular Retraction - 10 reps - 1 sets - 10 hold - 3x daily - 7x weekly  Doorway Pec Stretch at 60 Degrees Abduction - 3 reps - 1 sets - 3x daily - 7x weekly  Doorway Pec Stretch at 90 Degrees Abduction - 3 reps - 1 sets - 30 seconds hold - 3x daily - 7x weekly  Doorway Pec Stretch at 120 Degrees Abduction - 3 reps - 1 sets - 30 second hold hold - 3x daily - 7x weekly  Patient Education  TENS UNIT  Trigger Point Dry Needling

## 2018-06-30 NOTE — Therapy (Signed)
[place], [place], [address] Phone: [phone]   Fax:  [phone]  Physical Therapy Treatment  Patient Details  Name: Shannon [name] MRN: [id number] Date of Birth: 15-Dec-1965 Referring Provider: Dr Dianah Field    Encounter Date: 06/30/2018  PT End of Session - 06/30/18 0842    Visit Number  3    Number of Visits  12    Date for PT Re-Evaluation  08/05/18    PT Start Time  0800    PT Stop Time  0839    PT Time Calculation (min)  39 min    Activity Tolerance  Patient tolerated treatment well;Patient limited by pain       Past Medical History:  Diagnosis Date  . Anxiety   . Arthritis    back and hips   . Depression   . Diabetes mellitus without complication ([place])   . GERD (gastroesophageal reflux disease)   . History of hiatal hernia   . HPV (human papilloma virus) infection   . Hyperlipidemia   . Hypothyroidism   . PONV (postoperative nausea and vomiting)    slow to wake up   . Shortness of breath dyspnea    with exertion   . Sleep apnea    cpap -0 setting at 16   . Stress incontinence     Past Surgical History:  Procedure Laterality Date  . APPENDECTOMY    . BREATH TEK H PYLORI N/A 04/03/2015   Procedure: BREATH TEK H PYLORI;  Surgeon: Greer Pickerel, MD;  Location: [facility];  Service: General;  Laterality: N/A;  . CESAREAN SECTION    . 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   second interspace, left foot  . feet surgery    . LAPAROSCOPIC GASTRIC SLEEVE RESECTION WITH HIATAL HERNIA REPAIR N/A 08/13/2015   Procedure: LAPAROSCOPIC GASTRIC SLEEVE RESECTION WITH HIATAL HERNIA REPAIR;  Surgeon: Greer Pickerel, MD;  [facility];  Service: General;  Laterality: N/A;  . laproscopy    . UPPER GI ENDOSCOPY  08/13/2015   Procedure: UPPER GI ENDOSCOPY;  Surgeon: Greer Pickerel, MD;  Location: [facility];  Service:  General;;  . WISDOM TOOTH EXTRACTION      There were no vitals filed for this visit.  Subjective Assessment - 06/30/18 0759    Subjective  having a bad couple of day; pain is up to a 9/10; reports vertigo started before pain; wants to try DN    Patient Stated Goals  get rid of the neck pain or at least better tolerate the pain     Currently in Pain?  Yes    Pain Score  9     Pain Location  Neck    Pain Orientation  Right;Mid    Pain Descriptors / Indicators  Dull;Aching    Pain Onset  More than a month ago             Vestibular Assessment - 06/30/18 0840      Positional Testing   Sidelying Test  Sidelying Right;Sidelying Left    Horizontal Canal Testing  Horizontal Canal Right;Horizontal Canal Left      Sidelying Right   Sidelying Right Duration  none    Sidelying Right Symptoms  No nystagmus      Sidelying Left   Sidelying Left Duration  5-10 sec    Sidelying Left Symptoms  No nystagmus  Horizontal Canal Right   Horizontal Canal Right Duration  none    Horizontal Canal Right Symptoms  Normal      Horizontal Canal Left   Horizontal Canal Left Duration  5-10 sec    Horizontal Canal Left Symptoms  Normal               [facility] Adult PT Treatment/Exercise - 06/30/18 0810      Moist Heat Therapy   Number Minutes Moist Heat  10 Minutes    Moist Heat Location  Cervical      Manual Therapy   Manual Therapy  Soft tissue mobilization    Manual therapy comments  pt prone and supine; skilled monitoring and palpation of soft tissue during DN    Soft tissue mobilization  Rt UT/LS/cervical paraspinals, bil suboccipital release       Trigger Point Dry Needling - 06/30/18 0839    Consent Given?  Yes    Education Handout Provided  No   verbally reviewed; will provide next visit   Muscles Treated Upper Body  Upper trapezius;Rhomboids;Levator scapulae    Upper Trapezius Response  Twitch reponse elicited;Palpable increased muscle length    Levator Scapulae  Response  Twitch response elicited;Palpable increased muscle length    Rhomboids Response  Twitch response elicited;Palpable increased muscle length           PT Education - 06/30/18 0841    Education Details  DN    Person(s) Educated  Patient    Methods  Explanation    Comprehension  Verbalized understanding          PT Long Term Goals - 06/30/18 0942      PT LONG TERM GOAL #1   Title  Improve posture and alignment with patient to demonstrate improved upright posture with posterior shoulder girdle engaged 08/05/18    Time  6    Period  Weeks    Status  New      PT LONG TERM GOAL #2   Title  Increase cervical ROM by 8-12 degrees in all planes with minimal pain  08/05/18    Time  6    Period  Weeks    Status  New      PT LONG TERM GOAL #3   Title  Decrease pain by 50-75% allowing patient to participate in ADL's and work activities with greater ease 08/05/18    Time  6    Period  Weeks    Status  New      PT LONG TERM GOAL #4   Title  Independent in HEP 08/05/18    Time  6    Period  Weeks    Status  New      PT LONG TERM GOAL #5   Title  improve FOTO =/> 42% limited 08/05/18    Time  6    Period  Weeks    Status  New      PT LONG TERM GOAL #6   Title  vestibular goals to be written PRN            Plan - 06/30/18 0943    Clinical Impression Statement  Pt with exacerbation of symptoms today and reports elevated pain.  Pt today requesting DN to help with pain which she reports pain was better following DN and manual therapy.  Session limited by pain today.  Pt also with reports of vertigo; assessed today with mild subjective reports with positional testing but no nystagmus seen.  Will plan to further assess PRN but focus today on pain control.    Rehab Potential  Good    PT Frequency  2x / week    PT Duration  6 weeks    PT Treatment/Interventions  Patient/family education;ADLs/Self Care Home Management;Cryotherapy;Electrical Stimulation;Iontophoresis  4mg /ml Dexamethasone;Moist Heat;Ultrasound;Traction;Manual techniques;Neuromuscular re-education;Therapeutic activities;Therapeutic exercise;Dry needling;Canalith Repostioning;Vestibular;Balance training    PT Next Visit Plan  review HEP; continue with neuromuscuar re-ed and postural correction; manual work; modalities - assess response to DN as she requested today, vestibular assessment PRN    PT Home Exercise Plan  Access Code: [email or web address]     Consulted and Agree with Plan of Care  Patient       Patient will benefit from skilled therapeutic intervention in order to improve the following deficits and impairments:  Postural dysfunction, Improper body mechanics, Pain, Increased fascial restricitons, Increased muscle spasms, Decreased range of motion, Decreased mobility, Decreased activity tolerance  Visit Diagnosis: Cervicalgia - Plan: PT plan of care cert/re-cert  Other symptoms and signs involving the musculoskeletal system - Plan: PT plan of care cert/re-cert  Abnormal posture - Plan: PT plan of care cert/re-cert  Dizziness and giddiness - Plan: PT plan of care cert/re-cert     Problem List Patient Active Problem List   Diagnosis Date Noted  . Polyp of colon 05/21/2018  . Chest pain 05/09/2018  . BPPV (benign paroxysmal positional vertigo), right 05/09/2018  . Iron deficiency anemia secondary to inadequate dietary iron intake 03/25/2018  . 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 Fibroids 10/30/2016  . DDD (degenerative disc disease), cervical 07/31/2016  . Hiatal hernia 08/13/2015  . 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 Generalized anxiety disorder 03/16/2015  . DDD (degenerative disc disease), lumbar 08/28/2014  . Hypothyroidism 08/15/2014  . Diabetes mellitus type II, controlled ([place]) 08/15/2014  . OSA on CPAP 03/31/2014  . Abnormal weight gain 03/31/2014  . Sleep apnea 02/05/2012  . Mood disorder ([place]) 02/05/2012  . Hyperlipidemia 11/22/2008      Laureen Abrahams,  PT, DPT 06/30/18 10:15 AM    [facility] [place], [place], [address] Phone: [phone]   Fax:  [phone]  Name: LYNNLEIGH SODEN MRN: [id number] Date of Birth: 01-Jun-1966

## 2018-07-01 MED FILL — VIIBRYD 10 MG TABLET: 10 | 30 days supply | Qty: 30 | Fill #2

## 2018-07-01 MED FILL — lamoTRIgine 200 MG TABS: 200 | 30 days supply | Qty: 30 | Fill #0

## 2018-07-01 MED FILL — CITALOPRAM HBR 40 MG TABLET: 40 | 30 days supply | Qty: 30 | Fill #0

## 2018-07-02 ENCOUNTER — Ambulatory Visit (INDEPENDENT_AMBULATORY_CARE_PROVIDER_SITE_OTHER): Payer: 59 | Admitting: Physical Therapy

## 2018-07-02 DIAGNOSIS — R293 Abnormal posture: Secondary | ICD-10-CM | POA: Diagnosis not present

## 2018-07-02 DIAGNOSIS — R29898 Other symptoms and signs involving the musculoskeletal system: Secondary | ICD-10-CM | POA: Diagnosis not present

## 2018-07-02 DIAGNOSIS — M542 Cervicalgia: Secondary | ICD-10-CM | POA: Diagnosis not present

## 2018-07-02 NOTE — Therapy (Signed)
[place], [place], [address] Phone: [phone]   Fax:  [phone]  Physical Therapy Treatment  Patient Details  Name: Shannon [name] MRN: [id number] Date of Birth: Dec 29, 1965 Referring Provider: Dr Dianah Field    Encounter Date: 07/02/2018  PT End of Session - 07/02/18 0854    Visit Number  4    Number of Visits  12    Date for PT Re-Evaluation  08/05/18    PT Start Time  0805    PT Stop Time  0900    PT Time Calculation (min)  55 min       Past Medical History:  Diagnosis Date  . Anxiety   . Arthritis    back and hips   . Depression   . Diabetes mellitus without complication ([place])   . GERD (gastroesophageal reflux disease)   . History of hiatal hernia   . HPV (human papilloma virus) infection   . Hyperlipidemia   . Hypothyroidism   . PONV (postoperative nausea and vomiting)    slow to wake up   . Shortness of breath dyspnea    with exertion   . Sleep apnea    cpap -0 setting at 16   . Stress incontinence     Past Surgical History:  Procedure Laterality Date  . APPENDECTOMY    . BREATH TEK H PYLORI N/A 04/03/2015   Procedure: BREATH TEK H PYLORI;  Surgeon: Greer Pickerel, MD;  Location: [facility];  Service: General;  Laterality: N/A;  . CESAREAN SECTION    . 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   second interspace, left foot  . feet surgery    . LAPAROSCOPIC GASTRIC SLEEVE RESECTION WITH HIATAL HERNIA REPAIR N/A 08/13/2015   Procedure: LAPAROSCOPIC GASTRIC SLEEVE RESECTION WITH HIATAL HERNIA REPAIR;  Surgeon: Greer Pickerel, MD;  [facility];  Service: General;  Laterality: N/A;  . laproscopy    . UPPER GI ENDOSCOPY  08/13/2015   Procedure: UPPER GI ENDOSCOPY;  Surgeon: Greer Pickerel, MD;  Location: [facility];  Service: General;;  . WISDOM TOOTH EXTRACTION      There were no vitals filed for this  visit.  Subjective Assessment - 07/02/18 0807    Subjective  "I think I overdid it this weekend, with working 3 days in a row".   Pt reports the DN last session helped a bit.   She is still sore.  She did have some dizziness last night, but not this morning.     Patient Stated Goals  get rid of the neck pain or at least better tolerate the pain     Currently in Pain?  Yes    Pain Score  3     Pain Location  Neck    Pain Orientation  Right;Mid    Aggravating Factors   lying down, sitting still     Pain Relieving Factors  stretching, TENS, MHP       [facility] Adult PT Treatment/Exercise - 07/02/18 0001      Neck Exercises: Standing   Other Standing Exercises  scap squeeze against pool noodle with W's x 5 sec hold x 10 reps       Neck Exercises: Seated   Neck Retraction  5 reps;5 secs    Cervical Rotation  Right;Left;5 reps   with 4 head nods  with each rotation   Other Seated Exercise  thoracic ext over back of chair with hands support head x 4 reps- last rep pt reported increase in Rt radicular symptoms down to hand; resolved with head/arm in neutral position.       Neck Exercises: Supine   Other Supine Exercise  hooklying on 1/2 foam roll:  prolonged horiz abdct stretch x 30-45 sec x 3 reps; then active snow angels to tolerance x 5       Moist Heat Therapy   Number Minutes Moist Heat  15 Minutes    Moist Heat Location  Cervical      Electrical Stimulation   Electrical Stimulation Location  Rt upper trap and cervical paraspinals    Electrical Stimulation Action   IFC    Electrical Stimulation Parameters   to tolerance     Electrical Stimulation Goals  Pain;Tone      Manual Therapy   Manual therapy comments  pt in supported supine    Soft tissue mobilization  Rt UT/LS/rhomboid/bilat cervical paraspinals, bil suboccipital release      Neck Exercises: Stretches   Other Neck Stretches  Mid doorway stretch x 30 sec x 3 reps, upper position unilateral x 2 reps          PT Long  Term Goals - 06/30/18 0942      PT LONG TERM GOAL #1   Title  Improve posture and alignment with patient to demonstrate improved upright posture with posterior shoulder girdle engaged 08/05/18    Time  6    Period  Weeks    Status  New      PT LONG TERM GOAL #2   Title  Increase cervical ROM by 8-12 degrees in all planes with minimal pain  08/05/18    Time  6    Period  Weeks    Status  New      PT LONG TERM GOAL #3   Title  Decrease pain by 50-75% allowing patient to participate in ADL's and work activities with greater ease 08/05/18    Time  6    Period  Weeks    Status  New      PT LONG TERM GOAL #4   Title  Independent in HEP 08/05/18    Time  6    Period  Weeks    Status  New      PT LONG TERM GOAL #5   Title  improve FOTO =/> 42% limited 08/05/18    Time  6    Period  Weeks    Status  New      PT LONG TERM GOAL #6   Title  vestibular goals to be written PRN            Plan - 07/02/18 1021    Clinical Impression Statement  Pt had some Rt radicular symptoms with thoracic ext with hands behind head in sitting; resolved with rest and head/arm in neutral position.  Otherwise pt reported good tolerance to all other exercises.  Pt required freq cues throughout session for more upright posture.  She had one short incident of dizziness wihen she moved fromsit to supine via Rt side to Lt logroll; resolved with rest.  Progressing towards goals.     Rehab Potential  Good    PT Frequency  2x / week    PT Duration  6 weeks    PT Treatment/Interventions  Patient/family education;ADLs/Self Care Home Management;Cryotherapy;Electrical Stimulation;Iontophoresis 4mg /ml Dexamethasone;Moist Heat;Ultrasound;Traction;Manual techniques;Neuromuscular re-education;Therapeutic activities;Therapeutic exercise;Dry needling;Canalith Repostioning;Vestibular;Balance training  PT Next Visit Plan  trial of IASTM and combo to cervical musculature.     PT Home Exercise Plan  Access Code: [email or web address]      Consulted and Agree with Plan of Care  Patient       Patient will benefit from skilled therapeutic intervention in order to improve the following deficits and impairments:  Postural dysfunction, Improper body mechanics, Pain, Increased fascial restricitons, Increased muscle spasms, Decreased range of motion, Decreased mobility, Decreased activity tolerance  Visit Diagnosis: Cervicalgia  Other symptoms and signs involving the musculoskeletal system  Abnormal posture     Problem List Patient Active Problem List   Diagnosis Date Noted  . Polyp of colon 05/21/2018  . Chest pain 05/09/2018  . BPPV (benign paroxysmal positional vertigo), right 05/09/2018  . Iron deficiency anemia secondary to inadequate dietary iron intake 03/25/2018  . 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 Fibroids 10/30/2016  . DDD (degenerative disc disease), cervical 07/31/2016  . Hiatal hernia 08/13/2015  . 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 Generalized anxiety disorder 03/16/2015  . DDD (degenerative disc disease), lumbar 08/28/2014  . Hypothyroidism 08/15/2014  . Diabetes mellitus type II, controlled ([place]) 08/15/2014  . OSA on CPAP 03/31/2014  . Abnormal weight gain 03/31/2014  . Sleep apnea 02/05/2012  . Mood disorder ([place]) 02/05/2012  . Hyperlipidemia 11/22/2008   Kerin Perna, PTA 07/02/18 10:33 AM  [facility] [place], [place], [address] Phone: [phone]   Fax:  [phone]  Name: Shannon [name] MRN: [id number] Date of Birth: [birth year]

## 2018-07-09 ENCOUNTER — Ambulatory Visit (INDEPENDENT_AMBULATORY_CARE_PROVIDER_SITE_OTHER): Payer: 59 | Admitting: Physical Therapy

## 2018-07-09 DIAGNOSIS — R29898 Other symptoms and signs involving the musculoskeletal system: Secondary | ICD-10-CM | POA: Diagnosis not present

## 2018-07-09 DIAGNOSIS — M542 Cervicalgia: Secondary | ICD-10-CM | POA: Diagnosis not present

## 2018-07-09 DIAGNOSIS — R293 Abnormal posture: Secondary | ICD-10-CM | POA: Diagnosis not present

## 2018-07-09 NOTE — Therapy (Signed)
[place], [place], [address] Phone: [phone]   Fax:  [phone]  Physical Therapy Treatment  Patient Details  Name: Shannon [name] MRN: [id number] Date of Birth: June 16, 1966 Referring Provider: Dr Dianah Field    Encounter Date: 07/09/2018  PT End of Session - 07/09/18 1604    Visit Number  5    Number of Visits  12    Date for PT Re-Evaluation  08/05/18    PT Start Time  0254    PT Stop Time  1525    PT Time Calculation (min)  40 min    Activity Tolerance  Patient limited by pain    Behavior During Therapy  [facility] for tasks assessed/performed       Past Medical History:  Diagnosis Date  . Anxiety   . Arthritis    back and hips   . Depression   . Diabetes mellitus without complication ([place])   . GERD (gastroesophageal reflux disease)   . History of hiatal hernia   . HPV (human papilloma virus) infection   . Hyperlipidemia   . Hypothyroidism   . PONV (postoperative nausea and vomiting)    slow to wake up   . Shortness of breath dyspnea    with exertion   . Sleep apnea    cpap -0 setting at 16   . Stress incontinence     Past Surgical History:  Procedure Laterality Date  . APPENDECTOMY    . BREATH TEK H PYLORI N/A 04/03/2015   Procedure: BREATH TEK H PYLORI;  Surgeon: Greer Pickerel, MD;  Location: [facility];  Service: General;  Laterality: N/A;  . CESAREAN SECTION    . 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   second interspace, left foot  . feet surgery    . LAPAROSCOPIC GASTRIC SLEEVE RESECTION WITH HIATAL HERNIA REPAIR N/A 08/13/2015   Procedure: LAPAROSCOPIC GASTRIC SLEEVE RESECTION WITH HIATAL HERNIA REPAIR;  Surgeon: Greer Pickerel, MD;  Location: [facility];  Service: General;  Laterality: N/A;  . laproscopy    . UPPER GI ENDOSCOPY  08/13/2015   Procedure: UPPER GI ENDOSCOPY;  Surgeon: Greer Pickerel, MD;   Location: [facility];  Service: General;;  . WISDOM TOOTH EXTRACTION      There were no vitals filed for this visit.  Subjective Assessment - 07/09/18 1448    Subjective  Pt had 2 really good days (Tue/Wed, part of Thur), "the best I have felt in weeks".  She went to work and the pain increased again. Dizziness has been sporatic. Today she hurts, but has taken muscle relaxers prior to treatment.     Currently in Pain?  Yes    Pain Score  5     Pain Location  Neck    Pain Orientation  Right;Mid;Lower    Pain Descriptors / Indicators  Patsi Sears PT Assessment - 07/09/18 0001      Assessment   Medical Diagnosis  Cervical dysfunction    Referring Provider  Dr Dianah Field     Onset Date/Surgical Date  06/20/18   pain in the neck for the past 2-3 yrs    Hand Dominance  Right      AROM   Cervical Flexion  32    Cervical Extension  42    Cervical - Right Side Bend  37    Cervical -  Left Side Bend  37    Cervical - Right Rotation  67    Cervical - Left Rotation  65        [facility] Adult PT Treatment/Exercise - 07/09/18 0001      Neck Exercises: Seated   Cervical Rotation  Right;Left;5 reps   with 4 head nods  with each rotation   Other Seated Exercise  shoulder rolls x 10       Modalities   Modalities  Electrical Stimulation;Ultrasound      Electrical Stimulation   Electrical Stimulation Location  Rt upper trap and cervical paraspinals    Electrical Stimulation Action  combo [place]     Electrical Stimulation Parameters  to tolerance     Electrical Stimulation Goals  Pain;Tone      Ultrasound   Ultrasound Location  Rt upper trap and cervical paraspinals    Ultrasound Parameters  combo [place]: 100%, 1.3 w/cm2, 8 min     Ultrasound Goals  Pain   tightness     Manual Therapy   Manual Therapy  Myofascial release;Soft tissue mobilization    Manual therapy comments  pt prone    Soft tissue mobilization  STM to R upper trap, levator, rhomboid, occipital muscles.     Myofascial  Release  MFR to bilat thoracic paraspinals       Neck Exercises: Stretches   Upper Trapezius Stretch  Right;Left;2 reps;20 seconds    Other Neck Stretches  Mid doorway stretch x 30 sec x 2 reps, upper position unilateral x 2 reps; bilat bicep/pec stretch with hands behind back (holding towel in between hands) x 10 sec x 3 reps                   PT Long Term Goals - 07/09/18 1614      PT LONG TERM GOAL #1   Title  Improve posture and alignment with patient to demonstrate improved upright posture with posterior shoulder girdle engaged 08/05/18    Time  6    Period  Weeks    Status  On-going      PT LONG TERM GOAL #2   Title  Increase cervical ROM by 8-12 degrees in all planes with minimal pain  08/05/18    Time  6    Period  Weeks    Status  Partially Met      PT LONG TERM GOAL #3   Title  Decrease pain by 50-75% allowing patient to participate in ADL's and work activities with greater ease 08/05/18    Time  6    Period  Weeks    Status  On-going      PT LONG TERM GOAL #4   Title  Independent in HEP 08/05/18    Time  6    Period  Weeks    Status  On-going      PT LONG TERM GOAL #5   Title  improve FOTO =/> 42% limited 08/05/18    Time  6    Period  Weeks    Status  On-going      PT LONG TERM GOAL #6   Title  vestibular goals to be written PRN            Plan - 07/09/18 1604    Clinical Impression Statement  Pt presents with very guarded posture throughout session; requires cues to move head instead of holding herself so stiff.  Her cervical ROM has improved since last assessment.  Pt  has mild difficulty tolerating standing neck/pec stretches, reporting increased stiffness in neck afterwards.  She reported slight decrease in pain at end of session after combo [place] and manual therapy.      Rehab Potential  Good    PT Frequency  2x / week    PT Duration  6 weeks    PT Next Visit Plan  assess dizziness, possible DN to Rt neck musculature     Consulted and  Agree with Plan of Care  Patient       Patient will benefit from skilled therapeutic intervention in order to improve the following deficits and impairments:  Postural dysfunction, Improper body mechanics, Pain, Increased fascial restricitons, Increased muscle spasms, Decreased range of motion, Decreased mobility, Decreased activity tolerance  Visit Diagnosis: Cervicalgia  Other symptoms and signs involving the musculoskeletal system  Abnormal posture     Problem List Patient Active Problem List   Diagnosis Date Noted  . Polyp of colon 05/21/2018  . Chest pain 05/09/2018  . BPPV (benign paroxysmal positional vertigo), right 05/09/2018  . Iron deficiency anemia secondary to inadequate dietary iron intake 03/25/2018  . 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 Fibroids 10/30/2016  . DDD (degenerative disc disease), cervical 07/31/2016  . Hiatal hernia 08/13/2015  . 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 Generalized anxiety disorder 03/16/2015  . DDD (degenerative disc disease), lumbar 08/28/2014  . Hypothyroidism 08/15/2014  . Diabetes mellitus type II, controlled ([place]) 08/15/2014  . OSA on CPAP 03/31/2014  . Abnormal weight gain 03/31/2014  . Sleep apnea 02/05/2012  . Mood disorder ([place]) 02/05/2012  . Hyperlipidemia 11/22/2008   Kerin Perna, PTA 07/09/18 4:16 PM  [facility] [place], [place], [address] Phone: [phone]   Fax:  [phone]  Name: AANIYA STERBA MRN: [id number] Date of Birth: 1965/12/22

## 2018-07-14 ENCOUNTER — Telehealth: Payer: Self-pay

## 2018-07-14 NOTE — Telephone Encounter (Signed)
Patient called wants to know if FMLA papers were received from [employer] and filled out. Please advise. Rhonda Cunningham,CMA

## 2018-07-15 NOTE — Telephone Encounter (Signed)
  Patient has been advised that we did not receive FMLA paperwork and I advised her to call them and have it faxed to [phone].I will call the patient once we receive the paperwork. [place]

## 2018-07-15 NOTE — Telephone Encounter (Signed)
I just looked and did not see it in my inbasket. Please have them send it again to me at [phone]

## 2018-07-16 ENCOUNTER — Encounter: Payer: Self-pay | Admitting: Physical Therapy

## 2018-07-19 ENCOUNTER — Encounter: Payer: Self-pay | Admitting: Family Medicine

## 2018-07-21 ENCOUNTER — Ambulatory Visit (INDEPENDENT_AMBULATORY_CARE_PROVIDER_SITE_OTHER): Payer: 59 | Admitting: Physical Therapy

## 2018-07-21 ENCOUNTER — Encounter: Payer: Self-pay | Admitting: Physical Therapy

## 2018-07-21 DIAGNOSIS — R29898 Other symptoms and signs involving the musculoskeletal system: Secondary | ICD-10-CM

## 2018-07-21 DIAGNOSIS — R42 Dizziness and giddiness: Secondary | ICD-10-CM

## 2018-07-21 DIAGNOSIS — M542 Cervicalgia: Secondary | ICD-10-CM

## 2018-07-21 DIAGNOSIS — R293 Abnormal posture: Secondary | ICD-10-CM | POA: Diagnosis not present

## 2018-07-21 NOTE — Therapy (Signed)
[place], [place], [address] Phone: [phone]   Fax:  [phone]  Physical Therapy Treatment  Patient Details  Name: Shannon [name] MRN: [id number] Date of Birth: [birth year] Referring Provider (PT): Dr Dianah Field    Encounter Date: 07/21/2018  PT End of Session - 07/21/18 1429    Visit Number  6    Number of Visits  12    Date for PT Re-Evaluation  08/05/18    PT Start Time  1400    PT Stop Time  1443    PT Time Calculation (min)  43 min    Activity Tolerance  Patient limited by pain    Behavior During Therapy  [facility] for tasks assessed/performed       Past Medical History:  Diagnosis Date  . Anxiety   . Arthritis    back and hips   . Depression   . Diabetes mellitus without complication ([place])   . GERD (gastroesophageal reflux disease)   . History of hiatal hernia   . HPV (human papilloma virus) infection   . Hyperlipidemia   . Hypothyroidism   . PONV (postoperative nausea and vomiting)    slow to wake up   . Shortness of breath dyspnea    with exertion   . Sleep apnea    cpap -0 setting at 16   . Stress incontinence     Past Surgical History:  Procedure Laterality Date  . APPENDECTOMY    . BREATH TEK H PYLORI N/A 04/03/2015   Procedure: BREATH TEK H PYLORI;  Surgeon: Greer Pickerel, MD;  Location: [facility];  Service: General;  Laterality: N/A;  . CESAREAN SECTION    . 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   second interspace, left foot  . feet surgery    . LAPAROSCOPIC GASTRIC SLEEVE RESECTION WITH HIATAL HERNIA REPAIR N/A 08/13/2015   Procedure: LAPAROSCOPIC GASTRIC SLEEVE RESECTION WITH HIATAL HERNIA REPAIR;  Surgeon: Greer Pickerel, MD;  [facility];  Service: General;  Laterality: N/A;  . laproscopy    . UPPER GI ENDOSCOPY  08/13/2015   Procedure: UPPER GI ENDOSCOPY;  Surgeon: Greer Pickerel, MD;   Location[facility];  Service: General;;  . WISDOM TOOTH EXTRACTION      There were no vitals filed for this visit.  Subjective Assessment - 07/21/18 1402    Subjective  having some pain today    Patient Stated Goals  get rid of the neck pain or at least better tolerate the pain     Currently in Pain?  Yes    Pain Score  4     Pain Location  Neck    Pain Orientation  Right;Mid;Lower    Pain Descriptors / Indicators  Sharp    Pain Type  Chronic pain;Acute pain    Pain Onset  More than a month ago    Pain Frequency  Constant    Aggravating Factors   lying down, sitting still    Pain Relieving Factors  stretching, TENS, MHP                       [facility] Adult PT Treatment/Exercise - 07/21/18 1405      Neck Exercises: Machines for Strengthening   UBE (Upper Arm Bike)  L1 x 4 min (alternating each min)      Moist Heat Therapy   Number Minutes  Moist Heat  15 Minutes    Moist Heat Location  Cervical      Electrical Stimulation   Electrical Stimulation Location  Rt upper trap/post shoulder    Electrical Stimulation Action  IFC    Electrical Stimulation Parameters  to tolerance x 15 min    Electrical Stimulation Goals  Pain;Tone      Manual Therapy   Manual Therapy  Myofascial release;Soft tissue mobilization    Manual therapy comments  pt prone    Soft tissue mobilization  STM to R upper trap, levator, rhomboid, occipital muscles.     Myofascial Release  MFR to bilat thoracic paraspinals        Trigger Point Dry Needling - 07/21/18 1428    Consent Given?  Yes    Muscles Treated Upper Body  Upper trapezius;Levator scapulae    Upper Trapezius Response  Twitch reponse elicited;Palpable increased muscle length    Levator Scapulae Response  Twitch response elicited;Palpable increased muscle length                PT Long Term Goals - 07/09/18 1614      PT LONG TERM GOAL #1   Title  Improve posture and alignment with patient to demonstrate improved upright  posture with posterior shoulder girdle engaged 08/05/18    Time  6    Period  Weeks    Status  On-going      PT LONG TERM GOAL #2   Title  Increase cervical ROM by 8-12 degrees in all planes with minimal pain  08/05/18    Time  6    Period  Weeks    Status  Partially Met      PT LONG TERM GOAL #3   Title  Decrease pain by 50-75% allowing patient to participate in ADL's and work activities with greater ease 08/05/18    Time  6    Period  Weeks    Status  On-going      PT LONG TERM GOAL #4   Title  Independent in HEP 08/05/18    Time  6    Period  Weeks    Status  On-going      PT LONG TERM GOAL #5   Title  improve FOTO =/> 42% limited 08/05/18    Time  6    Period  Weeks    Status  On-going      PT LONG TERM GOAL #6   Title  vestibular goals to be written PRN            Plan - 07/21/18 1429    Clinical Impression Statement  Pt continues to have elevated pain with positive response today to DN and manual therapy.  Feel stress at work and home contributing to pain and discussed ways to help with stress today.  Will continue to benefit from PT to maximize function.    Rehab Potential  Good    PT Frequency  2x / week    PT Duration  6 weeks    PT Next Visit Plan  assess dizziness, possible DN to Rt neck musculature; manual/posture exercises/modalities    PT Home Exercise Plan  Access Code: [email or web address]     Consulted and Agree with Plan of Care  Patient       Patient will benefit from skilled therapeutic intervention in order to improve the following deficits and impairments:  Postural dysfunction, Improper body mechanics, Pain, Increased fascial restricitons, Increased muscle spasms, Decreased range of  motion, Decreased mobility, Decreased activity tolerance  Visit Diagnosis: Cervicalgia  Other symptoms and signs involving the musculoskeletal system  Abnormal posture  Dizziness and giddiness     Problem List Patient Active Problem List   Diagnosis Date Noted   . Polyp of colon 05/21/2018  . Chest pain 05/09/2018  . BPPV (benign paroxysmal positional vertigo), right 05/09/2018  . Iron deficiency anemia secondary to inadequate dietary iron intake 03/25/2018  . 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 Fibroids 10/30/2016  . DDD (degenerative disc disease), cervical 07/31/2016  . Hiatal hernia 08/13/2015  . 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 Generalized anxiety disorder 03/16/2015  . DDD (degenerative disc disease), lumbar 08/28/2014  . Hypothyroidism 08/15/2014  . Diabetes mellitus type II, controlled ([place]) 08/15/2014  . OSA on CPAP 03/31/2014  . Abnormal weight gain 03/31/2014  . Sleep apnea 02/05/2012  . Mood disorder ([place]) 02/05/2012  . Hyperlipidemia 11/22/2008      Laureen Abrahams, PT, DPT 07/21/18 2:32 PM    [facility] [place], [place], [address] Phone: [phone]   Fax:  [phone]  Name: Shannon [name] MRN: [id number] Date of Birth: 1965/11/29

## 2018-07-22 ENCOUNTER — Encounter: Payer: Self-pay | Admitting: Physical Therapy

## 2018-07-22 ENCOUNTER — Telehealth (HOSPITAL_COMMUNITY): Payer: Self-pay

## 2018-07-22 MED ORDER — ALPRAZOLAM 0.5 MG PO TABS: 0.5000 mg | ORAL_TABLET | Freq: Every day | ORAL | 0 refills | Status: DC | PRN

## 2018-07-22 NOTE — Telephone Encounter (Signed)
Patient called requesting a refill on Xanax 0.5 mg tabs. Next appointment is scheduled for 09-22-2018. Please advise

## 2018-07-22 NOTE — Telephone Encounter (Signed)
Prescription of xanax sent

## 2018-07-27 ENCOUNTER — Ambulatory Visit (INDEPENDENT_AMBULATORY_CARE_PROVIDER_SITE_OTHER): Payer: 59 | Admitting: Physical Therapy

## 2018-07-27 ENCOUNTER — Encounter: Payer: Self-pay | Admitting: Physical Therapy

## 2018-07-27 DIAGNOSIS — R29898 Other symptoms and signs involving the musculoskeletal system: Secondary | ICD-10-CM | POA: Diagnosis not present

## 2018-07-27 DIAGNOSIS — R293 Abnormal posture: Secondary | ICD-10-CM

## 2018-07-27 DIAGNOSIS — M542 Cervicalgia: Secondary | ICD-10-CM | POA: Diagnosis not present

## 2018-07-27 NOTE — Therapy (Signed)
[place], [place], [address] Phone: [phone]   Fax:  [phone]  Physical Therapy Treatment  Patient Details  Name: Shannon [name] MRN: [id number] Date of Birth: 18-Aug-1966 Referring Provider (PT): Dr Dianah Field    Encounter Date: 07/27/2018  PT End of Session - 07/27/18 1108    Visit Number  7    Number of Visits  12    Date for PT Re-Evaluation  08/05/18    PT Start Time  1025    PT Stop Time  1121    PT Time Calculation (min)  56 min    Activity Tolerance  Patient limited by pain    Behavior During Therapy  [facility] for tasks assessed/performed       Past Medical History:  Diagnosis Date  . Anxiety   . Arthritis    back and hips   . Depression   . Diabetes mellitus without complication ([place])   . GERD (gastroesophageal reflux disease)   . History of hiatal hernia   . HPV (human papilloma virus) infection   . Hyperlipidemia   . Hypothyroidism   . PONV (postoperative nausea and vomiting)    slow to wake up   . Shortness of breath dyspnea    with exertion   . Sleep apnea    cpap -0 setting at 16   . Stress incontinence     Past Surgical History:  Procedure Laterality Date  . APPENDECTOMY    . BREATH TEK H PYLORI N/A 04/03/2015   Procedure: BREATH TEK H PYLORI;  Surgeon: Greer Pickerel, MD;  Location: [facility];  Service: General;  Laterality: N/A;  . CESAREAN SECTION    . 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   second interspace, left foot  . feet surgery    . LAPAROSCOPIC GASTRIC SLEEVE RESECTION WITH HIATAL HERNIA REPAIR N/A 08/13/2015   Procedure: LAPAROSCOPIC GASTRIC SLEEVE RESECTION WITH HIATAL HERNIA REPAIR;  Surgeon: Greer Pickerel, MD;  [facility];  Service: General;  Laterality: N/A;  . laproscopy    . UPPER GI ENDOSCOPY  08/13/2015   Procedure: UPPER GI ENDOSCOPY;  Surgeon: Greer Pickerel, MD;   [facility];  Service: General;;  . WISDOM TOOTH EXTRACTION      There were no vitals filed for this visit.  Subjective Assessment - 07/27/18 1028    Subjective  feeling a little better; pain has been "okay"    Patient Stated Goals  get rid of the neck pain or at least better tolerate the pain     Currently in Pain?  Yes    Pain Score  4     Pain Location  Neck    Pain Orientation  Right;Mid;Lower    Pain Descriptors / Indicators  Sharp    Pain Type  Acute pain;Chronic pain    Pain Onset  More than a month ago    Pain Frequency  Constant    Aggravating Factors   lying down, sitting still    Pain Relieving Factors  stretching, TENS, MHP                       [facility] Adult PT Treatment/Exercise - 07/27/18 1030      Exercises   Exercises  Neck      Neck Exercises: Machines for Strengthening   UBE (Upper Arm Bike)  L2 x 4 min (  alternating each min)      Neck Exercises: Theraband   Shoulder External Rotation  10 reps;Green   supine   Other Theraband Exercises  shoulder flexion x 10 bil with green theraband      Neck Exercises: Supine   Neck Retraction  10 reps;5 secs    Other Supine Exercise  scap retraction x 10 reps      Moist Heat Therapy   Number Minutes Moist Heat  15 Minutes    Moist Heat Location  --   thoracic     Electrical Stimulation   Electrical Stimulation Location  Rt upper trap/post shoulder    Electrical Stimulation Action  IFC    Electrical Stimulation Parameters  to tolerance x 15 min; pt prone    Electrical Stimulation Goals  Pain;Tone      Manual Therapy   Manual Therapy  Myofascial release;Soft tissue mobilization    Manual therapy comments  pt prone    Soft tissue mobilization  STM to R upper trap, levator, rhomboid, occipital muscles.     Myofascial Release  MFR to bilat thoracic paraspinals        Trigger Point Dry Needling - 07/27/18 1107    Consent Given?  Yes    Muscles Treated Upper Body  Levator  scapulae;Subscapularis;Rhomboids    Levator Scapulae Response  Twitch response elicited;Palpable increased muscle length    Rhomboids Response  Twitch response elicited;Palpable increased muscle length    Subscapularis Response  Twitch response elicited;Palpable increased muscle length                PT Long Term Goals - 07/09/18 1614      PT LONG TERM GOAL #1   Title  Improve posture and alignment with patient to demonstrate improved upright posture with posterior shoulder girdle engaged 08/05/18    Time  6    Period  Weeks    Status  On-going      PT LONG TERM GOAL #2   Title  Increase cervical ROM by 8-12 degrees in all planes with minimal pain  08/05/18    Time  6    Period  Weeks    Status  Partially Met      PT LONG TERM GOAL #3   Title  Decrease pain by 50-75% allowing patient to participate in ADL's and work activities with greater ease 08/05/18    Time  6    Period  Weeks    Status  On-going      PT LONG TERM GOAL #4   Title  Independent in HEP 08/05/18    Time  6    Period  Weeks    Status  On-going      PT LONG TERM GOAL #5   Title  improve FOTO =/> 42% limited 08/05/18    Time  6    Period  Weeks    Status  On-going      PT LONG TERM GOAL #6   Title  vestibular goals to be written PRN            Plan - 07/27/18 1108    Clinical Impression Statement  Pt tolerated DN well today with good response following DN and manual therapy.  Pt demonstrates limited carryover and due for goal assessment and determination of continue v/s d/c and return to MD.  Pt reports she may call MD this week to follow up if no significant changes noted.    Rehab Potential  Good  PT Frequency  2x / week    PT Duration  6 weeks    PT Next Visit Plan  assess dizziness, possible DN to Rt neck musculature; manual/posture exercises/modalities; possible hold v/s continue depending on progress; FOTO    PT Home Exercise Plan  Access Code: [email or web address]     Consulted and Agree  with Plan of Care  Patient       Patient will benefit from skilled therapeutic intervention in order to improve the following deficits and impairments:  Postural dysfunction, Improper body mechanics, Pain, Increased fascial restricitons, Increased muscle spasms, Decreased range of motion, Decreased mobility, Decreased activity tolerance  Visit Diagnosis: Cervicalgia  Other symptoms and signs involving the musculoskeletal system  Abnormal posture     Problem List Patient Active Problem List   Diagnosis Date Noted  . Polyp of colon 05/21/2018  . Chest pain 05/09/2018  . BPPV (benign paroxysmal positional vertigo), right 05/09/2018  . Iron deficiency anemia secondary to inadequate dietary iron intake 03/25/2018  . 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 Fibroids 10/30/2016  . DDD (degenerative disc disease), cervical 07/31/2016  . Hiatal hernia 08/13/2015  . 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 Generalized anxiety disorder 03/16/2015  . DDD (degenerative disc disease), lumbar 08/28/2014  . Hypothyroidism 08/15/2014  . Diabetes mellitus type II, controlled ([place]) 08/15/2014  . OSA on CPAP 03/31/2014  . Abnormal weight gain 03/31/2014  . Sleep apnea 02/05/2012  . Mood disorder ([place]) 02/05/2012  . Hyperlipidemia 11/22/2008      Laureen Abrahams, PT, DPT 07/27/18 11:11 AM     [facility] [place], [place], [address] Phone: [phone]   Fax:  [phone]  Name: NATANYA HOLECEK MRN: [id number] Date of Birth: [birth year]

## 2018-08-02 DIAGNOSIS — G4733 Obstructive sleep apnea (adult) (pediatric): Secondary | ICD-10-CM | POA: Diagnosis not present

## 2018-08-06 ENCOUNTER — Encounter: Payer: Self-pay | Admitting: Sports Medicine

## 2018-08-06 ENCOUNTER — Encounter: Payer: Self-pay | Admitting: Physical Therapy

## 2018-08-06 ENCOUNTER — Ambulatory Visit (INDEPENDENT_AMBULATORY_CARE_PROVIDER_SITE_OTHER): Payer: 59 | Admitting: Sports Medicine

## 2018-08-06 ENCOUNTER — Ambulatory Visit (INDEPENDENT_AMBULATORY_CARE_PROVIDER_SITE_OTHER): Payer: 59 | Admitting: Physical Therapy

## 2018-08-06 DIAGNOSIS — R42 Dizziness and giddiness: Secondary | ICD-10-CM

## 2018-08-06 DIAGNOSIS — M7918 Myalgia, other site: Secondary | ICD-10-CM | POA: Diagnosis not present

## 2018-08-06 DIAGNOSIS — R29898 Other symptoms and signs involving the musculoskeletal system: Secondary | ICD-10-CM | POA: Diagnosis not present

## 2018-08-06 DIAGNOSIS — M542 Cervicalgia: Secondary | ICD-10-CM

## 2018-08-06 DIAGNOSIS — R293 Abnormal posture: Secondary | ICD-10-CM

## 2018-08-06 NOTE — Assessment & Plan Note (Signed)
Cervical spine MRIs for the most part negative. She is doing okay with physical therapy. Currently on both Celexa and Viibryd. Mostly myofascial pain with trigger points along the right trapezius, we did 4 trigger point injections today. Continue aggressive physical therapy. If insufficient relief we will add gabapentin versus Lyrica at night.

## 2018-08-06 NOTE — Progress Notes (Signed)
Subjective:    CC: Neck pain  HPI: Shannon [name] is a pleasant 52 year old female on a couple of psychotropics.  For some time now she is had pain in her neck, right sided around the trapezius, periscapular region.  She has been working in formal physical therapy and having minor improvements.  Pain continues to be moderate, persistent, localized without radiation, nothing down the arm.  I reviewed the past medical history, family history, social history, surgical history, and allergies today and no changes were needed.  Please see the problem list section below in epic for further details.  Past Medical History: Past Medical History:  Diagnosis Date  . Anxiety   . Arthritis    back and hips   . Depression   . Diabetes mellitus without complication ([place])   . GERD (gastroesophageal reflux disease)   . History of hiatal hernia   . HPV (human papilloma virus) infection   . Hyperlipidemia   . Hypothyroidism   . PONV (postoperative nausea and vomiting)    slow to wake up   . Shortness of breath dyspnea    with exertion   . Sleep apnea    cpap -0 setting at 16   . Stress incontinence    Past Surgical History: Past Surgical History:  Procedure Laterality Date  . APPENDECTOMY    . BREATH TEK H PYLORI N/A 04/03/2015   Procedure: BREATH TEK H PYLORI;  Surgeon: Greer Pickerel, MD;  Location: [facility];  Service: General;  Laterality: N/A;  . CESAREAN SECTION    . 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   second interspace, left foot  . feet surgery    . LAPAROSCOPIC GASTRIC SLEEVE RESECTION WITH HIATAL HERNIA REPAIR N/A 08/13/2015   Procedure: LAPAROSCOPIC GASTRIC SLEEVE RESECTION WITH HIATAL HERNIA REPAIR;  Surgeon: Greer Pickerel, MD;  Location: [facility];  Service: General;  Laterality: N/A;  . laproscopy    . UPPER GI ENDOSCOPY  08/13/2015   Procedure: UPPER GI ENDOSCOPY;  Surgeon: Greer Pickerel, MD;  Location: [facility];   Service: General;;  . WISDOM TOOTH EXTRACTION     Social History: Social History   Socioeconomic History  . Marital status: Divorced    Spouse name: Not on file  . Number of children: Not on file  . Years of education: Not on file  . Highest education level: Not on file  Occupational History  . Not on file  Social Needs  . Financial resource strain: Not on file  . Food insecurity:    Worry: Not on file    Inability: Not on file  . Transportation needs:    Medical: Not on file    Non-medical: Not on file  Tobacco Use  . Smoking status: Never Smoker  . Smokeless tobacco: Never Used  Substance and Sexual Activity  . Alcohol use: No    Alcohol/week: 0.0 standard drinks  . Drug use: No  . Sexual activity: Yes    Birth control/protection: Condom, IUD    Comment: IUD inserted 12/12/16  Lifestyle  . Physical activity:    Days per week: Not on file    Minutes per session: Not on file  . Stress: Not on file  Relationships  . Social connections:    Talks on phone: Not on file    Gets together: Not on file    Attends religious service: Not on file    Active member of club or  organization: Not on file    Attends meetings of clubs or organizations: Not on file    Relationship status: Not on file  Other Topics Concern  . Not on file  Social History Narrative  . Not on file   Family History: Family History  Problem Relation Age of Onset  . Hyperlipidemia Mother   . Sleep apnea Mother   . Depression Mother   . Diabetes Father   . Hypertension Father   . COPD Father   . Diabetes Paternal Aunt   . Diabetes Paternal Uncle   . Alcohol abuse Paternal Uncle   . Alcohol abuse Paternal Grandfather   . Depression Sister    Allergies: Allergies  Allergen Reactions  . Food Swelling    Big Red Gum makes her tongue swell  . Latuda [Lurasidone Hcl] Other (See Comments)    Severe aggitation  . Lipitor [Atorvastatin] Other (See Comments)    Muscle aches  . Belviq [Lorcaserin  Hcl]     Increased appetitie  . Contrave [Naltrexone-Bupropion Hcl Er] Other (See Comments)    Sleepy.   Medications: See med rec.  Review of Systems: No fevers, chills, night sweats, weight loss, chest pain, or shortness of breath.   Objective:    General: Well Developed, well nourished, and in no acute distress.  Neuro: Alert and oriented x3, extra-ocular muscles intact, sensation grossly intact.  HEENT: Normocephalic, atraumatic, pupils equal round reactive to light, neck supple, no masses, no lymphadenopathy, thyroid nonpalpable.  Skin: Warm and dry, no rashes. Cardiac: Regular rate and rhythm, no murmurs rubs or gallops, no lower extremity edema.  Respiratory: Clear to auscultation bilaterally. Not using accessory muscles, speaking in full sentences. Neck: Negative spurling's Full neck range of motion Grip strength and sensation normal in bilateral hands Strength good C4 to T1 distribution No sensory change to C4 to T1 Reflexes normal Multiple trigger points along the right trapezius  Cervical spine MRI is for the most part completely negative  Procedure:  Injection of #4 right trapezial trigger points Consent obtained and verified. Time-out conducted. Noted no overlying erythema, induration, or other signs of local infection. Skin prepped in a sterile fashion. Topical analgesic spray: Ethyl chloride. Completed without difficulty. Meds: 1 cc kenalog 40, 2 cc lidocaine, 2 cc bupivacaine spread out between the 4 trigger points. Pain immediately improved suggesting accurate placement of the medication. Advised to call if fevers/chills, erythema, induration, drainage, or persistent bleeding.  Impression and Recommendations:    Cervical myofascial pain syndrome Cervical spine MRIs for the most part negative. She is doing okay with physical therapy. Currently on both Celexa and Viibryd. Mostly myofascial pain with trigger points along the right trapezius, we did 4 trigger  point injections today. Continue aggressive physical therapy. If insufficient relief we will add gabapentin versus Lyrica at night. ___________________________________________ Gwen Her. Dianah Field, M.D., ABFM., CAQSM. Primary Care and Sports Medicine [facility]  Adjunct Professor of [employer]

## 2018-08-06 NOTE — Therapy (Signed)
[place], [place], [address] Phone: [phone]   Fax:  [phone]  Physical Therapy Treatment/Recertification  Patient Details  Name: Shannon [name] MRN: [id number] Date of Birth: 1966/09/24 Referring Provider (PT): Dr Dianah Field    Encounter Date: 08/06/2018  PT End of Session - 08/06/18 1024    Visit Number  8    Number of Visits  16    Date for PT Re-Evaluation  09/03/18    PT Start Time  0846    PT Stop Time  0927    PT Time Calculation (min)  41 min    Activity Tolerance  Patient limited by pain    Behavior During Therapy  [facility] for tasks assessed/performed       Past Medical History:  Diagnosis Date  . Anxiety   . Arthritis    back and hips   . Depression   . Diabetes mellitus without complication ([place])   . GERD (gastroesophageal reflux disease)   . History of hiatal hernia   . HPV (human papilloma virus) infection   . Hyperlipidemia   . Hypothyroidism   . PONV (postoperative nausea and vomiting)    slow to wake up   . Shortness of breath dyspnea    with exertion   . Sleep apnea    cpap -0 setting at 16   . Stress incontinence     Past Surgical History:  Procedure Laterality Date  . APPENDECTOMY    . BREATH TEK H PYLORI N/A 04/03/2015   Procedure: BREATH TEK H PYLORI;  Surgeon: Greer Pickerel, MD;  Location: [facility];  Service: General;  Laterality: N/A;  . CESAREAN SECTION    . 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   second interspace, left foot  . feet surgery    . LAPAROSCOPIC GASTRIC SLEEVE RESECTION WITH HIATAL HERNIA REPAIR N/A 08/13/2015   Procedure: LAPAROSCOPIC GASTRIC SLEEVE RESECTION WITH HIATAL HERNIA REPAIR;  Surgeon: Greer Pickerel, MD;  Location: [facility];  Service: General;  Laterality: N/A;  . laproscopy    . UPPER GI ENDOSCOPY  08/13/2015   Procedure: UPPER GI ENDOSCOPY;  Surgeon:  Greer Pickerel, MD;  Location: [facility];  Service: General;;  . WISDOM TOOTH EXTRACTION      There were no vitals filed for this visit.  Subjective Assessment - 08/06/18 0849    Subjective  doing okay today, but earlier this week had a lot of pain and needed to call out from work.  feels pain is 35% improved overall, some days more, and some days no improvement.    Patient Stated Goals  get rid of the neck pain or at least better tolerate the pain     Currently in Pain?  Yes    Pain Score  4    earlier in week: 9/10   Pain Location  Neck    Pain Orientation  Right;Mid;Lower    Pain Descriptors / Indicators  Sharp    Pain Type  Chronic pain;Acute pain    Pain Onset  More than a month ago    Pain Frequency  Constant    Aggravating Factors   lying down, sitting still    Pain Relieving Factors  stretching, TENS, MHP         [facility] PT Assessment - 08/06/18 0901      Assessment   Medical Diagnosis  Cervical dysfunction  Referring Provider (PT)  Dr Dianah Field     Onset Date/Surgical Date  06/20/18    Next MD Visit  08/06/18      Observation/Other Assessments   Focus on Therapeutic Outcomes (FOTO)   45% limitation      AROM   Cervical Flexion  32    Cervical Extension  45    Cervical - Right Side Bend  35    Cervical - Left Side Bend  33    Cervical - Right Rotation  71    Cervical - Left Rotation  70                   [facility] Adult PT Treatment/Exercise - 08/06/18 0850      Self-Care   Self-Care  Other Self-Care Comments    Other Self-Care Comments   long discussion with pt about current progress, goals and POC, pt verbalized agreement of plan and will follow up with MD for additonal recommendations  educated in use of theracane for trigger point release      Neck Exercises: Machines for Strengthening   UBE (Upper Arm Bike)  L2 x 4 min (alternating each min)      Manual Therapy   Manual Therapy  Soft tissue mobilization    Soft tissue mobilization  Rt  rhomboids, infraspinatus, levator scapula IASTM             PT Education - 08/06/18 1024    Education Details  see self care    Person(s) Educated  Patient    Methods  Explanation    Comprehension  Verbalized understanding          PT Long Term Goals - 08/06/18 1024      PT LONG TERM GOAL #1   Title  Improve posture and alignment with patient to demonstrate improved upright posture with posterior shoulder girdle engaged 09/03/18    Time  4    Period  Weeks    Status  On-going    Target Date  09/03/18      PT LONG TERM GOAL #2   Title  Increase cervical ROM by 8-12 degrees in all planes with minimal pain  08/05/18    Baseline  10/18: improved in side bending and rotation, no change with flexion/extension    Time  6    Period  Weeks    Status  Partially Met      PT LONG TERM GOAL #3   Title  Decrease pain by 50-75% allowing patient to participate in ADL's and work activities with greater ease     Baseline  10/18: improved 35%    Time  4    Period  Weeks    Status  On-going    Target Date  09/03/18      PT LONG TERM GOAL #4   Title  Independent in HEP     Time  4    Period  Weeks    Status  On-going    Target Date  09/03/18      PT LONG TERM GOAL #5   Title  improve FOTO =/> 42% limited     Baseline  10/18: 45% limited    Time  4    Period  Weeks    Status  On-going    Target Date  09/03/18      PT LONG TERM GOAL #6   Title  vestibular goals to be written PRN  Plan - 08/06/18 1026    Clinical Impression Statement  Pt arrives today reporting 35% improvement overall, but continues to have episodes of elevated pain rated a 9/10.  Pt has had limited carryover of relief between sessions, trying manual therapy, modalities and DN.  Pt to follow up with MD this afternoon and will await recommendations.  Goals partially met at this time and plan to renew 2x/wk for up to 4 weeks if needed.    Rehab Potential  Good    PT Frequency  2x / week    PT  Duration  4 weeks    PT Treatment/Interventions  Patient/family education;ADLs/Self Care Home Management;Cryotherapy;Electrical Stimulation;Iontophoresis 7m/ml Dexamethasone;Moist Heat;Ultrasound;Traction;Manual techniques;Neuromuscular re-education;Therapeutic activities;Therapeutic exercise;Dry needling;Canalith Repostioning;Vestibular;Balance training    PT Next Visit Plan  assess dizziness, possible DN to Rt neck musculature; manual/posture exercises/modalities    PT Home Exercise Plan  Access Code: [email or web address]     Consulted and Agree with Plan of Care  Patient       Patient will benefit from skilled therapeutic intervention in order to improve the following deficits and impairments:  Postural dysfunction, Improper body mechanics, Pain, Increased fascial restricitons, Increased muscle spasms, Decreased range of motion, Decreased mobility, Decreased activity tolerance  Visit Diagnosis: Cervicalgia - Plan: PT plan of care cert/re-cert  Other symptoms and signs involving the musculoskeletal system - Plan: PT plan of care cert/re-cert  Abnormal posture - Plan: PT plan of care cert/re-cert  Dizziness and giddiness - Plan: PT plan of care cert/re-cert     Problem List Patient Active Problem List   Diagnosis Date Noted  . Polyp of colon 05/21/2018  . Chest pain 05/09/2018  . BPPV (benign paroxysmal positional vertigo), right 05/09/2018  . Iron deficiency anemia secondary to inadequate dietary iron intake 03/25/2018  . 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 Fibroids 10/30/2016  . DDD (degenerative disc disease), cervical 07/31/2016  . Hiatal hernia 08/13/2015  . 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 Generalized anxiety disorder 03/16/2015  . DDD (degenerative disc disease), lumbar 08/28/2014  . Hypothyroidism 08/15/2014  . Diabetes mellitus type II, controlled ([place] 08/15/2014  . OSA on CPAP 03/31/2014  . Abnormal weight gain 03/31/2014  . Sleep apnea 02/05/2012  . Mood disorder  ([place] 02/05/2012  . Hyperlipidemia 11/22/2008      Shannon [name] PT, DPT 08/06/18 10:29 AM    [facility] [address]Phone: [phone]  Fax:  [phone] Name: Shannon HOLIANMRN: [id number] of Birth: 213-Mar-1967

## 2018-08-11 ENCOUNTER — Ambulatory Visit (INDEPENDENT_AMBULATORY_CARE_PROVIDER_SITE_OTHER): Payer: 59 | Admitting: Physical Therapy

## 2018-08-11 ENCOUNTER — Encounter: Payer: Self-pay | Admitting: Physical Therapy

## 2018-08-11 DIAGNOSIS — M542 Cervicalgia: Secondary | ICD-10-CM

## 2018-08-11 DIAGNOSIS — R293 Abnormal posture: Secondary | ICD-10-CM

## 2018-08-11 DIAGNOSIS — R29898 Other symptoms and signs involving the musculoskeletal system: Secondary | ICD-10-CM

## 2018-08-11 DIAGNOSIS — R42 Dizziness and giddiness: Secondary | ICD-10-CM

## 2018-08-11 NOTE — Therapy (Signed)
[place], [place], [address] Phone: [phone]   Fax:  [phone]  Physical Therapy Treatment  Patient Details  Name: Shannon [name] MRN: [id number] Date of Birth: January 02, 1966 Referring Provider (PT): Dr Dianah Field    Encounter Date: 08/11/2018  PT End of Session - 08/11/18 1445    Visit Number  9    Number of Visits  16    Date for PT Re-Evaluation  09/03/18    PT Start Time  1400    PT Stop Time  1455    PT Time Calculation (min)  55 min    Activity Tolerance  Patient limited by pain    Behavior During Therapy  [facility] for tasks assessed/performed       Past Medical History:  Diagnosis Date  . Anxiety   . Arthritis    back and hips   . Depression   . Diabetes mellitus without complication ([place])   . GERD (gastroesophageal reflux disease)   . History of hiatal hernia   . HPV (human papilloma virus) infection   . Hyperlipidemia   . Hypothyroidism   . PONV (postoperative nausea and vomiting)    slow to wake up   . Shortness of breath dyspnea    with exertion   . Sleep apnea    cpap -0 setting at 16   . Stress incontinence     Past Surgical History:  Procedure Laterality Date  . APPENDECTOMY    . BREATH TEK H PYLORI N/A 04/03/2015   Procedure: BREATH TEK H PYLORI;  Surgeon: Greer Pickerel, MD;  Location: [facility];  Service: General;  Laterality: N/A;  . CESAREAN SECTION    . 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   second interspace, left foot  . feet surgery    . LAPAROSCOPIC GASTRIC SLEEVE RESECTION WITH HIATAL HERNIA REPAIR N/A 08/13/2015   Procedure: LAPAROSCOPIC GASTRIC SLEEVE RESECTION WITH HIATAL HERNIA REPAIR;  Surgeon: Greer Pickerel, MD;  [facility]: [facility];  Service: General;  Laterality: N/A;  . laproscopy    . UPPER GI ENDOSCOPY  08/13/2015   Procedure: UPPER GI ENDOSCOPY;  Surgeon: Greer Pickerel, MD;   Location: [facility];  Service: General;;  . WISDOM TOOTH EXTRACTION      There were no vitals filed for this visit.  Subjective Assessment - 08/11/18 1402    Subjective  had 4 trigger point injections on Friday, Dr T wants 6 more weeks of PT.  Feeling some aching and pain beginning since injections but felt much better initially.    Patient Stated Goals  get rid of the neck pain or at least better tolerate the pain     Currently in Pain?  Yes    Pain Score  1     Pain Location  Neck    Pain Orientation  Right;Mid;Lower    Pain Descriptors / Indicators  Sharp    Pain Type  Acute pain;Chronic pain    Pain Onset  More than a month ago    Pain Frequency  Constant    Aggravating Factors   lying down, sitting still    Pain Relieving Factors  stretching, TENS, MHP                       [facility] Adult PT Treatment/Exercise - 08/11/18 1442      Exercises   Exercises  Shoulder  Neck Exercises: Machines for Strengthening   UBE (Upper Arm Bike)  L2 x 4 min (alternating each min)      Shoulder Exercises: Standing   Row  20 reps;Theraband    Theraband Level (Shoulder Row)  Level 3 (Green)      Moist Heat Therapy   Number Minutes Moist Heat  15 Minutes    Moist Heat Location  Cervical;Shoulder      Electrical Stimulation   Electrical Stimulation Location  Rt upper trap/post shoulder    Electrical Stimulation Action  IFC    Electrical Stimulation Parameters  to tolerance x 15 min    Electrical Stimulation Goals  Pain;Tone      Manual Therapy   Manual Therapy  Soft tissue mobilization    Manual therapy comments  theracane trigger point release    Soft tissue mobilization  Rt rhomboids, infraspinatus, levator scapula      Neck Exercises: Stretches   Other Neck Stretches  doorway stretch 2x30 sec       Trigger Point Dry Needling - 08/11/18 1444    Consent Given?  Yes    Muscles Treated Upper Body  Levator scapulae;Rhomboids    Levator Scapulae Response  Twitch  response elicited;Palpable increased muscle length    Rhomboids Response  Twitch response elicited;Palpable increased muscle length   and thoracic 2-4 multifidi               PT Long Term Goals - 08/06/18 1024      PT LONG TERM GOAL #1   Title  Improve posture and alignment with patient to demonstrate improved upright posture with posterior shoulder girdle engaged 09/03/18    Time  4    Period  Weeks    Status  On-going    Target Date  09/03/18      PT LONG TERM GOAL #2   Title  Increase cervical ROM by 8-12 degrees in all planes with minimal pain  08/05/18    Baseline  10/18: improved in side bending and rotation, no change with flexion/extension    Time  6    Period  Weeks    Status  Partially Met      PT LONG TERM GOAL #3   Title  Decrease pain by 50-75% allowing patient to participate in ADL's and work activities with greater ease     Baseline  10/18: improved 35%    Time  4    Period  Weeks    Status  On-going    Target Date  09/03/18      PT LONG TERM GOAL #4   Title  Independent in HEP     Time  4    Period  Weeks    Status  On-going    Target Date  09/03/18      PT LONG TERM GOAL #5   Title  improve FOTO =/> 42% limited     Baseline  10/18: 45% limited    Time  4    Period  Weeks    Status  On-going    Target Date  09/03/18      PT LONG TERM GOAL #6   Title  vestibular goals to be written PRN            Plan - 08/11/18 1445    Clinical Impression Statement  Pt with improvement in pain following trigger point injections, and pt requesting DN again today to trigger points still active.  Pt tolerated session well today  and will continue to benefit from PT to maximize function.    Rehab Potential  Good    PT Frequency  2x / week    PT Duration  4 weeks    PT Treatment/Interventions  Patient/family education;ADLs/Self Care Home Management;Cryotherapy;Electrical Stimulation;Iontophoresis 75m/ml Dexamethasone;Moist Heat;Ultrasound;Traction;Manual  techniques;Neuromuscular re-education;Therapeutic activities;Therapeutic exercise;Dry needling;Canalith Repostioning;Vestibular;Balance training    PT Next Visit Plan  assess dizziness, possible DN to Rt neck musculature; manual/posture exercises/modalities    PT Home Exercise Plan  Access Code: [email or web address]     Consulted and Agree with Plan of Care  Patient       Patient will benefit from skilled therapeutic intervention in order to improve the following deficits and impairments:  Postural dysfunction, Improper body mechanics, Pain, Increased fascial restricitons, Increased muscle spasms, Decreased range of motion, Decreased mobility, Decreased activity tolerance  Visit Diagnosis: Cervicalgia  Other symptoms and signs involving the musculoskeletal system  Abnormal posture  Dizziness and giddiness     Problem List Patient Active Problem List   Diagnosis Date Noted  . Polyp of colon 05/21/2018  . Chest pain 05/09/2018  . BPPV (benign paroxysmal positional vertigo), right 05/09/2018  . Iron deficiency anemia secondary to inadequate dietary iron intake 03/25/2018  . 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 Fibroids 10/30/2016  . Cervical myofascial pain syndrome 07/31/2016  . Hiatal hernia 08/13/2015  . 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 Generalized anxiety disorder 03/16/2015  . DDD (degenerative disc disease), lumbar 08/28/2014  . Hypothyroidism 08/15/2014  . Diabetes mellitus type II, controlled ([place] 08/15/2014  . OSA on CPAP 03/31/2014  . Abnormal weight gain 03/31/2014  . Sleep apnea 02/05/2012  . Mood disorder ([place] 02/05/2012  . Hyperlipidemia 11/22/2008      SLaureen Abrahams PT, DPT 08/11/18 2:51 PM     [facility] [address]Phone: [phone]  Fax:  [phone] Name: Shannon KRAUSSMRN: [id number] of Birth: 210-05-1966

## 2018-08-18 ENCOUNTER — Encounter: Payer: Self-pay | Admitting: Physical Therapy

## 2018-08-18 ENCOUNTER — Ambulatory Visit (INDEPENDENT_AMBULATORY_CARE_PROVIDER_SITE_OTHER): Payer: 59 | Admitting: Physical Therapy

## 2018-08-18 DIAGNOSIS — M542 Cervicalgia: Secondary | ICD-10-CM

## 2018-08-18 DIAGNOSIS — R42 Dizziness and giddiness: Secondary | ICD-10-CM | POA: Diagnosis not present

## 2018-08-18 DIAGNOSIS — R293 Abnormal posture: Secondary | ICD-10-CM

## 2018-08-18 DIAGNOSIS — R29898 Other symptoms and signs involving the musculoskeletal system: Secondary | ICD-10-CM | POA: Diagnosis not present

## 2018-08-18 NOTE — Therapy (Signed)
[place], [place], [address] Phone: [phone]   Fax:  [phone]  Physical Therapy Treatment  Patient Details  Name: Shannon [name] MRN: [id number] Date of Birth: [birth year] Referring Provider (PT): Dr Dianah Field    Encounter Date: 08/18/2018  PT End of Session - 08/18/18 0845    Visit Number  10    Number of Visits  16    Date for PT Re-Evaluation  09/03/18    PT Start Time  0803    PT Stop Time  0845    PT Time Calculation (min)  42 min    Activity Tolerance  Patient limited by pain    Behavior During Therapy  [facility] for tasks assessed/performed       Past Medical History:  Diagnosis Date  . Anxiety   . Arthritis    back and hips   . Depression   . Diabetes mellitus without complication ([place])   . GERD (gastroesophageal reflux disease)   . History of hiatal hernia   . HPV (human papilloma virus) infection   . Hyperlipidemia   . Hypothyroidism   . PONV (postoperative nausea and vomiting)    slow to wake up   . Shortness of breath dyspnea    with exertion   . Sleep apnea    cpap -0 setting at 16   . Stress incontinence     Past Surgical History:  Procedure Laterality Date  . APPENDECTOMY    . BREATH TEK H PYLORI N/A 04/03/2015   Procedure: BREATH TEK H PYLORI;  Surgeon: Greer Pickerel, MD;  Location: [facility];  Service: General;  Laterality: N/A;  . CESAREAN SECTION    . 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   second interspace, left foot  . feet surgery    . LAPAROSCOPIC GASTRIC SLEEVE RESECTION WITH HIATAL HERNIA REPAIR N/A 08/13/2015   Procedure: LAPAROSCOPIC GASTRIC SLEEVE RESECTION WITH HIATAL HERNIA REPAIR;  Surgeon: Greer Pickerel, MD;  Location: [facility];  Service: General;  Laterality: N/A;  . laproscopy    . UPPER GI ENDOSCOPY  08/13/2015   Procedure: UPPER GI ENDOSCOPY;  Surgeon: Greer Pickerel, MD;   Location: [facility];  Service: General;;  . WISDOM TOOTH EXTRACTION      There were no vitals filed for this visit.  Subjective Assessment - 08/18/18 0804    Subjective  didn't sleep well last night, feels like pain is returning.  having buring sensation along upper trap on Rt side    Patient Stated Goals  get rid of the neck pain or at least better tolerate the pain     Currently in Pain?  Yes    Pain Score  4     Pain Location  Neck    Pain Orientation  Right;Mid;Lower    Pain Descriptors / Indicators  Sharp    Pain Type  Acute pain;Chronic pain    Pain Onset  More than a month ago    Pain Frequency  Constant    Aggravating Factors   lying down, sitting still    Pain Relieving Factors  stretching, TENS, MHP                       [facility] Adult PT Treatment/Exercise - 08/18/18 0806      Neck Exercises: Machines for Strengthening   UBE (Upper Arm Bike)  L2 x 4  min (alternating each min)      Manual Therapy   Manual Therapy  Soft tissue mobilization    Manual therapy comments  pt prone and supine; skilled palpation and monitoring of soft tissue during DN    Soft tissue mobilization  Rt rhomboids, infraspinatus, levator scapula; and upper trap      Neck Exercises: Stretches   Upper Trapezius Stretch  Right;Left;2 reps;20 seconds    Levator Stretch  Right;2 reps;30 seconds    Other Neck Stretches  rhomboid stretch 2 x 30 sec       Trigger Point Dry Needling - 08/18/18 0841    Consent Given?  Yes    Muscles Treated Upper Body  Upper trapezius;Infraspinatus;Rhomboids    Upper Trapezius Response  Twitch reponse elicited;Palpable increased muscle length    Rhomboids Response  Twitch response elicited;Palpable increased muscle length    Infraspinatus Response  Twitch response elicited;Palpable increased muscle length                PT Long Term Goals - 08/06/18 1024      PT LONG TERM GOAL #1   Title  Improve posture and alignment with patient to  demonstrate improved upright posture with posterior shoulder girdle engaged 09/03/18    Time  4    Period  Weeks    Status  On-going    Target Date  09/03/18      PT LONG TERM GOAL #2   Title  Increase cervical ROM by 8-12 degrees in all planes with minimal pain  08/05/18    Baseline  10/18: improved in side bending and rotation, no change with flexion/extension    Time  6    Period  Weeks    Status  Partially Met      PT LONG TERM GOAL #3   Title  Decrease pain by 50-75% allowing patient to participate in ADL's and work activities with greater ease     Baseline  10/18: improved 35%    Time  4    Period  Weeks    Status  On-going    Target Date  09/03/18      PT LONG TERM GOAL #4   Title  Independent in HEP     Time  4    Period  Weeks    Status  On-going    Target Date  09/03/18      PT LONG TERM GOAL #5   Title  improve FOTO =/> 42% limited     Baseline  10/18: 45% limited    Time  4    Period  Weeks    Status  On-going    Target Date  09/03/18      PT LONG TERM GOAL #6   Title  vestibular goals to be written PRN            Plan - 08/18/18 0846    Clinical Impression Statement  Pt continues to have variable pain but positive response to DN today.  Will contiue to benefit from PT to maximize function.    Rehab Potential  Good    PT Frequency  2x / week    PT Duration  4 weeks    PT Treatment/Interventions  Patient/family education;ADLs/Self Care Home Management;Cryotherapy;Electrical Stimulation;Iontophoresis 99m/ml Dexamethasone;Moist Heat;Ultrasound;Traction;Manual techniques;Neuromuscular re-education;Therapeutic activities;Therapeutic exercise;Dry needling;Canalith Repostioning;Vestibular;Balance training    PT Next Visit Plan  assess dizziness, possible DN to Rt neck musculature; manual/posture exercises/modalities    PT Home Exercise Plan  Access Code:  [email or web address]     Consulted and Agree with Plan of Care  Patient       Patient will benefit from skilled  therapeutic intervention in order to improve the following deficits and impairments:  Postural dysfunction, Improper body mechanics, Pain, Increased fascial restricitons, Increased muscle spasms, Decreased range of motion, Decreased mobility, Decreased activity tolerance  Visit Diagnosis: Cervicalgia  Other symptoms and signs involving the musculoskeletal system  Abnormal posture  Dizziness and giddiness     Problem List Patient Active Problem List   Diagnosis Date Noted  . Polyp of colon 05/21/2018  . Chest pain 05/09/2018  . BPPV (benign paroxysmal positional vertigo), right 05/09/2018  . Iron deficiency anemia secondary to inadequate dietary iron intake 03/25/2018  . 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 Fibroids 10/30/2016  . Cervical myofascial pain syndrome 07/31/2016  . Hiatal hernia 08/13/2015  . 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 Generalized anxiety disorder 03/16/2015  . DDD (degenerative disc disease), lumbar 08/28/2014  . Hypothyroidism 08/15/2014  . Diabetes mellitus type II, controlled ([place]) 08/15/2014  . OSA on CPAP 03/31/2014  . Abnormal weight gain 03/31/2014  . Sleep apnea 02/05/2012  . Mood disorder ([place]) 02/05/2012  . Hyperlipidemia 11/22/2008      Laureen Abrahams, PT, DPT 08/18/18 8:47 AM    [facility] [place], [place], [address] Phone: [phone]   Fax:  [phone]  Name: Shannon [name] MRN: [id number] Date of Birth: 02-20-1966

## 2018-08-20 ENCOUNTER — Encounter: Payer: Self-pay | Admitting: Physical Therapy

## 2018-08-23 MED FILL — lamoTRIgine 200 MG TABS: 200 | 30 days supply | Qty: 30 | Fill #1

## 2018-08-23 MED FILL — CITALOPRAM HBR 40 MG TABLET: 40 | 30 days supply | Qty: 30 | Fill #1

## 2018-08-23 MED FILL — VIIBRYD 10 MG TABLET: 10 | 30 days supply | Qty: 30 | Fill #0

## 2018-08-25 ENCOUNTER — Encounter: Payer: Self-pay | Admitting: Physical Therapy

## 2018-08-26 ENCOUNTER — Encounter: Payer: Self-pay | Admitting: Physical Therapy

## 2018-08-26 ENCOUNTER — Ambulatory Visit (INDEPENDENT_AMBULATORY_CARE_PROVIDER_SITE_OTHER): Payer: 59 | Admitting: Physical Therapy

## 2018-08-26 DIAGNOSIS — R29898 Other symptoms and signs involving the musculoskeletal system: Secondary | ICD-10-CM

## 2018-08-26 DIAGNOSIS — M542 Cervicalgia: Secondary | ICD-10-CM

## 2018-08-26 DIAGNOSIS — R293 Abnormal posture: Secondary | ICD-10-CM | POA: Diagnosis not present

## 2018-08-26 DIAGNOSIS — R42 Dizziness and giddiness: Secondary | ICD-10-CM

## 2018-08-26 NOTE — Therapy (Signed)
Shannon [name], [place], [address] Phone: [phone]   Fax:  [phone]  Physical Therapy Treatment  Patient Details  Name: Shannon [name] MRN: [id number] Date of Birth: [birth year] Referring Provider (PT): Dr Dianah Field    Encounter Date: 08/26/2018  PT End of Session - 08/26/18 1537    Visit Number  11    Number of Visits  16    Date for PT Re-Evaluation  09/03/18    PT Start Time  9191    PT Stop Time  1525    PT Time Calculation (min)  40 min    Activity Tolerance  Patient limited by pain    Behavior During Therapy  [facility] for tasks assessed/performed       Past Medical History:  Diagnosis Date  . Anxiety   . Arthritis    back and hips   . Depression   . Diabetes mellitus without complication ([place])   . GERD (gastroesophageal reflux disease)   . History of hiatal hernia   . HPV (human papilloma virus) infection   . Hyperlipidemia   . Hypothyroidism   . PONV (postoperative nausea and vomiting)    slow to wake up   . Shortness of breath dyspnea    with exertion   . Sleep apnea    cpap -0 setting at 16   . Stress incontinence     Past Surgical History:  Procedure Laterality Date  . APPENDECTOMY    . BREATH TEK H PYLORI N/A 04/03/2015   Procedure: BREATH TEK H PYLORI;  Surgeon: Greer Pickerel, MD;  Location: [facility];  Service: General;  Laterality: N/A;  . CESAREAN SECTION    . 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   second interspace, left foot  . feet surgery    . LAPAROSCOPIC GASTRIC SLEEVE RESECTION WITH HIATAL HERNIA REPAIR N/A 08/13/2015   Procedure: LAPAROSCOPIC GASTRIC SLEEVE RESECTION WITH HIATAL HERNIA REPAIR;  Surgeon: Greer Pickerel, MD;  [facility];  Service: General;  Laterality: N/A;  . laproscopy    . UPPER GI ENDOSCOPY  08/13/2015   Procedure: UPPER GI ENDOSCOPY;  Surgeon: Greer Pickerel, MD;   [facility];  Service: General;;  . WISDOM TOOTH EXTRACTION      There were no vitals filed for this visit.  Subjective Assessment - 08/26/18 1446    Subjective  sees Dr. Darene Lamer on Tuesday.  still having trouble in the one spot (Rt levator scapula)    Patient Stated Goals  get rid of the neck pain or at least better tolerate the pain     Currently in Pain?  Yes    Pain Score  4     Pain Location  Neck    Pain Orientation  Right    Pain Descriptors / Indicators  Sharp    Pain Type  Acute pain;Chronic pain    Pain Onset  More than a month ago    Aggravating Factors   lying down, sitting still    Pain Relieving Factors  stretching, TENS, MHP                       [facility] Adult PT Treatment/Exercise - 08/26/18 1448      Neck Exercises: Machines for Strengthening   UBE (Upper Arm Bike)  L2 x 4 min (alternating each min)      Electrical Stimulation  Electrical Stimulation Location  Rt rhomboids    Chartered certified accountant  with DN    Electrical Stimulation Goals  Tone;Pain      Manual Therapy   Manual Therapy  Soft tissue mobilization    Manual therapy comments  pt prone; skilled palpation and monitoring of soft tissue during DN    Soft tissue mobilization  Rt rhomboids, infraspinatus, levator scapula; and upper trap       Trigger Point Dry Needling - 08/26/18 1537    Consent Given?  Yes    Muscles Treated Upper Body  Rhomboids    Rhomboids Response  Twitch response elicited;Palpable increased muscle length                PT Long Term Goals - 08/06/18 1024      PT LONG TERM GOAL #1   Title  Improve posture and alignment with patient to demonstrate improved upright posture with posterior shoulder girdle engaged 09/03/18    Time  4    Period  Weeks    Status  On-going    Target Date  09/03/18      PT LONG TERM GOAL #2   Title  Increase cervical ROM by 8-12 degrees in all planes with minimal pain  08/05/18    Baseline  10/18: improved in side  bending and rotation, no change with flexion/extension    Time  6    Period  Weeks    Status  Partially Met      PT LONG TERM GOAL #3   Title  Decrease pain by 50-75% allowing patient to participate in ADL's and work activities with greater ease     Baseline  10/18: improved 35%    Time  4    Period  Weeks    Status  On-going    Target Date  09/03/18      PT LONG TERM GOAL #4   Title  Independent in HEP     Time  4    Period  Weeks    Status  On-going    Target Date  09/03/18      PT LONG TERM GOAL #5   Title  improve FOTO =/> 42% limited     Baseline  10/18: 45% limited    Time  4    Period  Weeks    Status  On-going    Target Date  09/03/18      PT LONG TERM GOAL #6   Title  vestibular goals to be written PRN            Plan - 08/26/18 1537    Clinical Impression Statement  Pt reported decreased pain following DN with estim today, but continues to have pain at trigger point in rhomboid.  Pt to see MD next week, but will see one more visit before MD appt.    Rehab Potential  Good    PT Frequency  2x / week    PT Duration  4 weeks    PT Treatment/Interventions  Patient/family education;ADLs/Self Care Home Management;Cryotherapy;Electrical Stimulation;Iontophoresis 65m/ml Dexamethasone;Moist Heat;Ultrasound;Traction;Manual techniques;Neuromuscular re-education;Therapeutic activities;Therapeutic exercise;Dry needling;Canalith Repostioning;Vestibular;Balance training    PT Next Visit Plan  assess dizziness, possible DN to Rt neck musculature; manual/posture exercises/modalities; plan for MD note    PT Home Exercise Plan  Access Code: [email or web address]     Consulted and Agree with Plan of Care  Patient       Patient will benefit from skilled therapeutic intervention in order to improve  the following deficits and impairments:  Postural dysfunction, Improper body mechanics, Pain, Increased fascial restricitons, Increased muscle spasms, Decreased range of motion, Decreased mobility,  Decreased activity tolerance  Visit Diagnosis: Cervicalgia  Other symptoms and signs involving the musculoskeletal system  Abnormal posture  Dizziness and giddiness     Problem List Patient Active Problem List   Diagnosis Date Noted  . Polyp of colon 05/21/2018  . Chest pain 05/09/2018  . BPPV (benign paroxysmal positional vertigo), right 05/09/2018  . Iron deficiency anemia secondary to inadequate dietary iron intake 03/25/2018  . 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 Fibroids 10/30/2016  . Cervical myofascial pain syndrome 07/31/2016  . Hiatal hernia 08/13/2015  . 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 Generalized anxiety disorder 03/16/2015  . DDD (degenerative disc disease), lumbar 08/28/2014  . Hypothyroidism 08/15/2014  . Diabetes mellitus type II, controlled ([place]) 08/15/2014  . OSA on CPAP 03/31/2014  . Abnormal weight gain 03/31/2014  . Sleep apnea 02/05/2012  . Mood disorder ([place]) 02/05/2012  . Hyperlipidemia 11/22/2008      Laureen Abrahams, PT, DPT 08/26/18 3:39 PM     [facility] [place], [place], [address] Phone: [phone]   Fax:  [phone]  Name: JOELYN LOVER MRN: [id number] Date of Birth: 02-16-1966

## 2018-08-30 ENCOUNTER — Encounter: Payer: Self-pay | Admitting: Physical Therapy

## 2018-08-30 ENCOUNTER — Ambulatory Visit (INDEPENDENT_AMBULATORY_CARE_PROVIDER_SITE_OTHER): Payer: 59 | Admitting: Physical Therapy

## 2018-08-30 DIAGNOSIS — R293 Abnormal posture: Secondary | ICD-10-CM

## 2018-08-30 DIAGNOSIS — M542 Cervicalgia: Secondary | ICD-10-CM | POA: Diagnosis not present

## 2018-08-30 DIAGNOSIS — R42 Dizziness and giddiness: Secondary | ICD-10-CM

## 2018-08-30 DIAGNOSIS — R29898 Other symptoms and signs involving the musculoskeletal system: Secondary | ICD-10-CM | POA: Diagnosis not present

## 2018-08-30 NOTE — Therapy (Signed)
[place], [place], [address] Phone: [phone]   Fax:  [phone]  Physical Therapy Treatment  Patient Details  Name: Shannon [name] MRN: [id number] Date of Birth: 1966/01/24 Referring Provider (PT): Dr Dianah Field    Encounter Date: 08/30/2018  PT End of Session - 08/30/18 0841    Visit Number  12    Number of Visits  16    Date for PT Re-Evaluation  09/03/18    PT Start Time  0802    PT Stop Time  0841    PT Time Calculation (min)  39 min    Activity Tolerance  Patient limited by pain    Behavior During Therapy  [facility] for tasks assessed/performed       Past Medical History:  Diagnosis Date  . Anxiety   . Arthritis    back and hips   . Depression   . Diabetes mellitus without complication ([place])   . GERD (gastroesophageal reflux disease)   . History of hiatal hernia   . HPV (human papilloma virus) infection   . Hyperlipidemia   . Hypothyroidism   . PONV (postoperative nausea and vomiting)    slow to wake up   . Shortness of breath dyspnea    with exertion   . Sleep apnea    cpap -0 setting at 16   . Stress incontinence     Past Surgical History:  Procedure Laterality Date  . APPENDECTOMY    . BREATH TEK H PYLORI N/A 04/03/2015   Procedure: BREATH TEK H PYLORI;  Surgeon: Greer Pickerel, MD;  Location: [facility];  Service: General;  Laterality: N/A;  . CESAREAN SECTION    . 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   second interspace, left foot  . feet surgery    . LAPAROSCOPIC GASTRIC SLEEVE RESECTION WITH HIATAL HERNIA REPAIR N/A 08/13/2015   Procedure: LAPAROSCOPIC GASTRIC SLEEVE RESECTION WITH HIATAL HERNIA REPAIR;  Surgeon: Greer Pickerel, MD;  [facility];  Service: General;  Laterality: N/A;  . laproscopy    . UPPER GI ENDOSCOPY  08/13/2015   Procedure: UPPER GI ENDOSCOPY;  Surgeon: Greer Pickerel, MD;   [facility];  Service: General;;  . WISDOM TOOTH EXTRACTION      There were no vitals filed for this visit.  Subjective Assessment - 08/30/18 0803    Subjective  feeling better; pain 60% improved    Patient Stated Goals  get rid of the neck pain or at least better tolerate the pain     Currently in Pain?  Yes    Pain Score  2     Pain Location  Neck    Pain Orientation  Right    Pain Descriptors / Indicators  Sharp    Pain Type  Acute pain;Chronic pain    Pain Onset  More than a month ago    Pain Frequency  Constant    Aggravating Factors   lying down, sitting still    Pain Relieving Factors  stretching, TENS, MHP         [facility] PT Assessment - 08/30/18 0842      Assessment   Medical Diagnosis  Cervical dysfunction    Referring Provider (PT)  Dr Dianah Field     Onset Date/Surgical Date  06/20/18      Observation/Other Assessments   Focus on Therapeutic Outcomes (FOTO)   63 (37% limited)  [facility] Adult PT Treatment/Exercise - 08/30/18 0804      Neck Exercises: Machines for Strengthening   UBE (Upper Arm Bike)  L3 x 4 min (alternating each min)      Manual Therapy   Manual Therapy  Soft tissue mobilization    Manual therapy comments  pt prone; skilled palpation and monitoring of soft tissue during DN    Soft tissue mobilization  Rt rhomboids, infraspinatus, levator scapula; and upper trap       Trigger Point Dry Needling - 08/30/18 0841    Consent Given?  Yes    Muscles Treated Upper Body  Rhomboids    Rhomboids Response  Twitch response elicited;Palpable increased muscle length                PT Long Term Goals - 08/30/18 0842      PT LONG TERM GOAL #1   Title  Improve posture and alignment with patient to demonstrate improved upright posture with posterior shoulder girdle engaged 09/03/18    Time  4    Period  Weeks    Status  On-going    Target Date  09/03/18      PT LONG TERM GOAL #2   Title  Increase cervical  ROM by 8-12 degrees in all planes with minimal pain  08/05/18    Baseline  10/18: improved in side bending and rotation, no change with flexion/extension    Time  6    Period  Weeks    Status  Partially Met      PT LONG TERM GOAL #3   Title  Decrease pain by 50-75% allowing patient to participate in ADL's and work activities with greater ease     Baseline  11/11: improved 60%    Time  4    Period  Weeks    Status  Achieved      PT LONG TERM GOAL #4   Title  Independent in HEP     Time  4    Period  Weeks    Status  On-going      PT LONG TERM GOAL #5   Title  improve FOTO =/> 42% limited     Baseline  11/11: 37% limited    Time  4    Period  Weeks    Status  Achieved      PT LONG TERM GOAL #6   Title  vestibular goals to be written PRN            Plan - 08/30/18 0843    Clinical Impression Statement  Pt progressing well meeting 2 LTGs today, and anticipate she will meet goals in next 1-2 weeks.  Will continue to benefit from PT to improve function and ensure carryover between sessions.    Rehab Potential  Good    PT Frequency  2x / week    PT Duration  4 weeks    PT Treatment/Interventions  Patient/family education;ADLs/Self Care Home Management;Cryotherapy;Electrical Stimulation;Iontophoresis 57m/ml Dexamethasone;Moist Heat;Ultrasound;Traction;Manual techniques;Neuromuscular re-education;Therapeutic activities;Therapeutic exercise;Dry needling;Canalith Repostioning;Vestibular;Balance training    PT Next Visit Plan  assess dizziness, possible DN to Rt neck musculature; manual/posture exercises/modalities; plan for MD note    PT Home Exercise Plan  Access Code: [email or web address]     Consulted and Agree with Plan of Care  Patient       Patient will benefit from skilled therapeutic intervention in order to improve the following deficits and impairments:  Postural dysfunction, Improper body mechanics, Pain, Increased fascial restricitons, Increased  muscle spasms, Decreased range of  motion, Decreased mobility, Decreased activity tolerance  Visit Diagnosis: Cervicalgia  Other symptoms and signs involving the musculoskeletal system  Abnormal posture  Dizziness and giddiness     Problem List Patient Active Problem List   Diagnosis Date Noted  . Polyp of colon 05/21/2018  . Chest pain 05/09/2018  . BPPV (benign paroxysmal positional vertigo), right 05/09/2018  . Iron deficiency anemia secondary to inadequate dietary iron intake 03/25/2018  . 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 Fibroids 10/30/2016  . Cervical myofascial pain syndrome 07/31/2016  . Hiatal hernia 08/13/2015  . 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 Generalized anxiety disorder 03/16/2015  . DDD (degenerative disc disease), lumbar 08/28/2014  . Hypothyroidism 08/15/2014  . Diabetes mellitus type II, controlled ([place]) 08/15/2014  . OSA on CPAP 03/31/2014  . Abnormal weight gain 03/31/2014  . Sleep apnea 02/05/2012  . Mood disorder ([place]) 02/05/2012  . Hyperlipidemia 11/22/2008      Laureen Abrahams, PT, DPT 08/30/18 8:44 AM     [facility] [place], [place], [address] Phone: [phone]   Fax:  [phone]  Name: Shannon [name] MRN: [id number] Date of Birth: [birth year]

## 2018-08-31 ENCOUNTER — Ambulatory Visit: Payer: 59 | Admitting: Sports Medicine

## 2018-08-31 ENCOUNTER — Telehealth (HOSPITAL_COMMUNITY): Payer: Self-pay

## 2018-08-31 MED ORDER — ALPRAZOLAM 0.5 MG PO TABS: 0.5000 mg | ORAL_TABLET | Freq: Every day | ORAL | 0 refills | Status: DC | PRN

## 2018-08-31 MED FILL — ALPRAZolam 0.5 MG TABS: 0.5 | 30 days supply | Qty: 30 | Fill #0

## 2018-08-31 NOTE — Telephone Encounter (Signed)
sent 

## 2018-08-31 NOTE — Telephone Encounter (Signed)
Patient called requesting a refill on Xanax 0.5mg  tabs. Next appointment is 09-22-18. [facility] is the pharmacy.

## 2018-08-31 NOTE — Telephone Encounter (Signed)
Called patient and told her prescription had been sent to the pharmacy

## 2018-09-01 ENCOUNTER — Ambulatory Visit (INDEPENDENT_AMBULATORY_CARE_PROVIDER_SITE_OTHER): Payer: 59 | Admitting: Physical Therapy

## 2018-09-01 ENCOUNTER — Encounter: Payer: Self-pay | Admitting: Physical Therapy

## 2018-09-01 DIAGNOSIS — R29898 Other symptoms and signs involving the musculoskeletal system: Secondary | ICD-10-CM

## 2018-09-01 DIAGNOSIS — R42 Dizziness and giddiness: Secondary | ICD-10-CM

## 2018-09-01 DIAGNOSIS — R293 Abnormal posture: Secondary | ICD-10-CM

## 2018-09-01 DIAGNOSIS — M542 Cervicalgia: Secondary | ICD-10-CM

## 2018-09-01 NOTE — Therapy (Addendum)
[place] [place], [place], [address] Phone: [phone]   Fax:  [phone]  Physical Therapy Treatment/Discharge  Patient Details  Name: Shannon [name] MRN: [id number] Date of Birth: 1966/10/16 Referring Provider (PT): Dr Dianah Field    Encounter Date: 09/01/2018  PT End of Session - 09/01/18 1025    Visit Number  13    Number of Visits  16    Date for PT Re-Evaluation  09/03/18    PT Start Time  0928    PT Stop Time  1006    PT Time Calculation (min)  38 min    Activity Tolerance  Patient limited by pain    Behavior During Therapy  [facility] for tasks assessed/performed       Past Medical History:  Diagnosis Date  . Anxiety   . Arthritis    back and hips   . Depression   . Diabetes mellitus without complication ([place])   . GERD (gastroesophageal reflux disease)   . History of hiatal hernia   . HPV (human papilloma virus) infection   . Hyperlipidemia   . Hypothyroidism   . PONV (postoperative nausea and vomiting)    slow to wake up   . Shortness of breath dyspnea    with exertion   . Sleep apnea    cpap -0 setting at 16   . Stress incontinence     Past Surgical History:  Procedure Laterality Date  . APPENDECTOMY    . BREATH TEK H PYLORI N/A 04/03/2015   Procedure: BREATH TEK H PYLORI;  Surgeon: Greer Pickerel, MD;  Location: [facility];  Service: General;  Laterality: N/A;  . CESAREAN SECTION    . 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   second interspace, left foot  . feet surgery    . LAPAROSCOPIC GASTRIC SLEEVE RESECTION WITH HIATAL HERNIA REPAIR N/A 08/13/2015   Procedure: LAPAROSCOPIC GASTRIC SLEEVE RESECTION WITH HIATAL HERNIA REPAIR;  Surgeon: Greer Pickerel, MD;  Location: [facility];  Service: General;  Laterality: N/A;  . laproscopy    . UPPER GI ENDOSCOPY  08/13/2015   Procedure: UPPER GI ENDOSCOPY;  Surgeon: Greer Pickerel, MD;  Location: [facility];  Service: General;;  . WISDOM TOOTH EXTRACTION      There were no vitals filed for this visit.  Subjective Assessment - 09/01/18 0933    Subjective  canceled appt with Dr. Darene Lamer yesterday, rescheduled for next week.  still having pain in that isolated place.  past 2 weeks has felt better than in a long time.    Patient Stated Goals  get rid of the neck pain or at least better tolerate the pain     Currently in Pain?  Yes    Pain Score  2     Pain Location  Neck    Pain Orientation  Right    Pain Descriptors / Indicators  Sharp    Pain Type  Acute pain;Chronic pain    Pain Onset  More than a month ago    Pain Frequency  Constant    Aggravating Factors   lying, sitting still    Pain Relieving Factors  stretching, TENS, MHP                       [facility] Adult PT Treatment/Exercise - 09/01/18 0936      Neck Exercises: Machines for Strengthening  UBE (Upper Arm Bike)  L3 x 4 min (alternating each min)      Neck Exercises: Theraband   Shoulder External Rotation  15 reps;Green   supine   Horizontal ABduction  15 reps;Green   supine   Other Theraband Exercises  shoulder flexion x 15 bil with green theraband      Neck Exercises: Supine   Other Supine Exercise  prone scapular retraction x 15; 5 sec hold      Neck Exercises: Stretches   Chest Stretch  --   3 min on 1/2 foam roll                 PT Long Term Goals - 09/01/18 1025      PT LONG TERM GOAL #1   Title  Improve posture and alignment with patient to demonstrate improved upright posture with posterior shoulder girdle engaged 09/03/18    Baseline  11/13: improved    Time  4    Period  Weeks    Status  Partially Met      PT LONG TERM GOAL #2   Title  Increase cervical ROM by 8-12 degrees in all planes with minimal pain  08/05/18    Baseline  10/18: improved in side bending and rotation, no change with flexion/extension    Time  6    Period  Weeks    Status   Partially Met      PT LONG TERM GOAL #3   Title  Decrease pain by 50-75% allowing patient to participate in ADL's and work activities with greater ease     Baseline  11/11: improved 60%    Time  4    Period  Weeks    Status  Achieved      PT LONG TERM GOAL #4   Title  Independent in HEP     Baseline  11/13: met to date    Time  4    Period  Weeks    Status  Partially Met      PT LONG TERM GOAL #5   Title  improve FOTO =/> 42% limited     Baseline  11/11: 37% limited    Time  4    Period  Weeks    Status  Achieved      PT LONG TERM GOAL #6   Title  vestibular goals to be written PRN            Plan - 09/01/18 1026    Clinical Impression Statement  Pt tolerated session well today and reports pain improved overall.  Still with pain in rhomboids, and treated with manual therapy, exercises and DN.  Will plan to see what MD says to determine d/c v. renew.    Rehab Potential  Good    PT Frequency  2x / week    PT Duration  4 weeks    PT Treatment/Interventions  Patient/family education;ADLs/Self Care Home Management;Cryotherapy;Electrical Stimulation;Iontophoresis 42m/ml Dexamethasone;Moist Heat;Ultrasound;Traction;Manual techniques;Neuromuscular re-education;Therapeutic activities;Therapeutic exercise;Dry needling;Canalith Repostioning;Vestibular;Balance training    PT Next Visit Plan  see what MD says, will need renewal if she returns    PT Home Exercise Plan  Access Code: [email or web address]     Consulted and Agree with Plan of Care  Patient       Patient will benefit from skilled therapeutic intervention in order to improve the following deficits and impairments:  Postural dysfunction, Improper body mechanics, Pain, Increased fascial restricitons, Increased muscle spasms, Decreased range of motion, Decreased mobility,  Decreased activity tolerance  Visit Diagnosis: Cervicalgia  Other symptoms and signs involving the musculoskeletal system  Abnormal posture  Dizziness and  giddiness     Problem List Patient Active Problem List   Diagnosis Date Noted  . Polyp of colon 05/21/2018  . Chest pain 05/09/2018  . BPPV (benign paroxysmal positional vertigo), right 05/09/2018  . Iron deficiency anemia secondary to inadequate dietary iron intake 03/25/2018  . 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 Fibroids 10/30/2016  . Cervical myofascial pain syndrome 07/31/2016  . Hiatal hernia 08/13/2015  . 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 Generalized anxiety disorder 03/16/2015  . DDD (degenerative disc disease), lumbar 08/28/2014  . Hypothyroidism 08/15/2014  . Diabetes mellitus type II, controlled ([place]) 08/15/2014  . OSA on CPAP 03/31/2014  . Abnormal weight gain 03/31/2014  . Sleep apnea 02/05/2012  . Mood disorder ([place]) 02/05/2012  . Hyperlipidemia 11/22/2008      Laureen Abrahams, PT, DPT 09/01/18 10:28 AM     [facility] [place], [place], [address] Phone: [phone]   Fax:  [phone]  Name: Shannon [name] MRN: [id number] Date of Birth: February 13, 1966     PHYSICAL THERAPY DISCHARGE SUMMARY  Visits from Start of Care: 13  Current functional level related to goals / functional outcomes: See above   Remaining deficits: See above   Education / Equipment: HEP  Plan: Patient agrees to discharge.  Patient goals were partially met. Patient is being discharged due to being pleased with the current functional level.  ?????    Laureen Abrahams, PT, DPT 09/30/18 9:32 AM  [facility] [place], [place] [address]  [phone] (office) [phone] (fax)

## 2018-09-03 ENCOUNTER — Ambulatory Visit: Payer: Self-pay | Admitting: Sports Medicine

## 2018-09-08 ENCOUNTER — Ambulatory Visit (INDEPENDENT_AMBULATORY_CARE_PROVIDER_SITE_OTHER): Payer: 59 | Admitting: Sports Medicine

## 2018-09-08 ENCOUNTER — Encounter: Payer: Self-pay | Admitting: Sports Medicine

## 2018-09-08 DIAGNOSIS — M7918 Myalgia, other site: Secondary | ICD-10-CM | POA: Diagnosis not present

## 2018-09-08 MED ORDER — PREGABALIN 75 MG PO CAPS: ORAL_CAPSULE | ORAL | 0 refills | Status: DC

## 2018-09-08 MED FILL — LYRICA 75 MG CAPSULE: 75 | 14 days supply | Qty: 28 | Fill #0

## 2018-09-08 NOTE — Assessment & Plan Note (Signed)
Cervical spine MRI was for the most part negative, only the mildest C6-C7 spondylosis that is ever seen. We did multiple trigger point injections at the last visit that provided a week of relief. She is currently on Celexa and Viibryd treatment by her psychiatrist. Today we are can add Lyrica with a discount coupon and free 14-day trial offer, we will start with 75 mg and switch to 165 if needed. We can certainly try a S1-S2 interlaminar epidural if persistent discomfort, return to see me in a month.

## 2018-09-08 NOTE — Patient Instructions (Signed)
Call me if you do not get a good response to the 75 mg tablets for the trial offer and I will increase the dose.

## 2018-09-08 NOTE — Progress Notes (Signed)
Subjective:    CC: Follow-up neck pain  HPI: Zilphia returns, she has cervical myofascial pain syndrome, also has extremely mild cervical spondylosis with a very tiny C6-C7 disc protrusion with uncinate spurring, no central or foraminal stenosis.  We did some trigger point injection to the last visit, she had only a week of relief.  She does have some mild right supraspinatus calcific tendinopathy but no pain referrable to the shoulder or rotator cuff.  I reviewed the past medical history, family history, social history, surgical history, and allergies today and no changes were needed.  Please see the problem list section below in epic for further details.  Past Medical History: Past Medical History:  Diagnosis Date  . Anxiety   . Arthritis    back and hips   . Depression   . Diabetes mellitus without complication ([place])   . GERD (gastroesophageal reflux disease)   . History of hiatal hernia   . HPV (human papilloma virus) infection   . Hyperlipidemia   . Hypothyroidism   . PONV (postoperative nausea and vomiting)    slow to wake up   . Shortness of breath dyspnea    with exertion   . Sleep apnea    cpap -0 setting at 16   . Stress incontinence    Past Surgical History: Past Surgical History:  Procedure Laterality Date  . APPENDECTOMY    . BREATH TEK H PYLORI N/A 04/03/2015   Procedure: BREATH TEK H PYLORI;  Surgeon: Greer Pickerel, MD;  Location: [facility];  Service: General;  Laterality: N/A;  . CESAREAN SECTION    . 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   second interspace, left foot  . feet surgery    . LAPAROSCOPIC GASTRIC SLEEVE RESECTION WITH HIATAL HERNIA REPAIR N/A 08/13/2015   Procedure: LAPAROSCOPIC GASTRIC SLEEVE RESECTION WITH HIATAL HERNIA REPAIR;  Surgeon: Greer Pickerel, MD;  [facility];  Service: General;  Laterality: N/A;  . laproscopy    . UPPER GI ENDOSCOPY  08/13/2015   Procedure: UPPER GI ENDOSCOPY;  Surgeon: Greer Pickerel, MD;  Location[facility];  Service: General;;  . WISDOM TOOTH EXTRACTION     Social History: Social History   Socioeconomic History  . Marital status: Divorced    Spouse name: Not on file  . Number of children: Not on file  . Years of education: Not on file  . Highest education level: Not on file  Occupational History  . Not on file  Social Needs  . Financial resource strain: Not on file  . Food insecurity:    Worry: Not on file    Inability: Not on file  . Transportation needs:    Medical: Not on file    Non-medical: Not on file  Tobacco Use  . Smoking status: Never Smoker  . Smokeless tobacco: Never Used  Substance and Sexual Activity  . Alcohol use: No    Alcohol/week: 0.0 standard drinks  . Drug use: No  . Sexual activity: Yes    Birth control/protection: Condom, IUD    Comment: IUD inserted 12/12/16  Lifestyle  . Physical activity:    Days per week: Not on file    Minutes per session: Not on file  . Stress: Not on file  Relationships  . Social connections:    Talks on phone: Not on file    Gets together: Not on file    Attends religious service: Not  on file    Active member of club or organization: Not on file    Attends meetings of clubs or organizations: Not on file    Relationship status: Not on file  Other Topics Concern  . Not on file  Social History Narrative  . Not on file   Family History: Family History  Problem Relation Age of Onset  . Hyperlipidemia Mother   . Sleep apnea Mother   . Depression Mother   . Diabetes Father   . Hypertension Father   . COPD Father   . Diabetes Paternal Aunt   . Diabetes Paternal Uncle   . Alcohol abuse Paternal Uncle   . Alcohol abuse Paternal Grandfather   . Depression Sister    Allergies: Allergies  Allergen Reactions  . Food Swelling    Big Red Gum makes her tongue swell  . Latuda [Lurasidone Hcl] Other (See Comments)    Severe aggitation  . Lipitor  [Atorvastatin] Other (See Comments)    Muscle aches  . Belviq [Lorcaserin Hcl]     Increased appetitie  . Contrave [Naltrexone-Bupropion Hcl Er] Other (See Comments)    Sleepy.   Medications: See med rec.  Review of Systems: No fevers, chills, night sweats, weight loss, chest pain, or shortness of breath.   Objective:    General: Well Developed, well nourished, and in no acute distress.  Neuro: Alert and oriented x3, extra-ocular muscles intact, sensation grossly intact.  HEENT: Normocephalic, atraumatic, pupils equal round reactive to light, neck supple, no masses, no lymphadenopathy, thyroid nonpalpable.  Skin: Warm and dry, no rashes. Cardiac: Regular rate and rhythm, no murmurs rubs or gallops, no lower extremity edema.  Respiratory: Clear to auscultation bilaterally. Not using accessory muscles, speaking in full sentences.  Impression and Recommendations:    Cervical myofascial pain syndrome Cervical spine MRI was for the most part negative, only the mildest C6-C7 spondylosis that is ever seen. We did multiple trigger point injections at the last visit that provided a week of relief. She is currently on Celexa and Viibryd treatment by her psychiatrist. Today we are can add Lyrica with a discount coupon and free 14-day trial offer, we will start with 75 mg and switch to 165 if needed. We can certainly try a Q7-R9 interlaminar epidural if persistent discomfort, return to see me in a month. ___________________________________________ Gwen Her. Dianah Field, M.D., ABFM., CAQSM. Primary Care and Sports Medicine [facility]  Adjunct Professor of [place] [employer] [facility] [employer]

## 2018-09-22 ENCOUNTER — Ambulatory Visit (HOSPITAL_COMMUNITY): Payer: Self-pay | Admitting: Psychiatry

## 2018-09-27 ENCOUNTER — Telehealth: Payer: Self-pay | Admitting: *Deleted

## 2018-09-27 DIAGNOSIS — M7918 Myalgia, other site: Secondary | ICD-10-CM

## 2018-09-27 MED ORDER — PREGABALIN 75 MG PO CAPS: 75.0000 mg | ORAL_CAPSULE | Freq: Two times a day (BID) | ORAL | 3 refills | Status: DC

## 2018-09-27 MED FILL — PREGABALIN 75 MG CAPS: 75 | 30 days supply | Qty: 60 | Fill #0

## 2018-09-27 NOTE — Telephone Encounter (Signed)
Pt left vm stating that the Lyrica has helped and would like for you send in the rx to [facility] for her.

## 2018-09-27 NOTE — Telephone Encounter (Signed)
Pt notified of rx. 

## 2018-09-27 NOTE — Telephone Encounter (Signed)
Done

## 2018-09-28 ENCOUNTER — Encounter (HOSPITAL_COMMUNITY): Payer: Self-pay | Admitting: Psychiatry

## 2018-09-28 ENCOUNTER — Ambulatory Visit (INDEPENDENT_AMBULATORY_CARE_PROVIDER_SITE_OTHER): Payer: 59 | Admitting: Psychiatry

## 2018-09-28 ENCOUNTER — Other Ambulatory Visit: Payer: Self-pay

## 2018-09-28 VITALS — BP 128/78 | HR 75 | Ht 65.0 in | Wt 218.0 lb

## 2018-09-28 DIAGNOSIS — F0630 Mood disorder due to known physiological condition, unspecified: Secondary | ICD-10-CM | POA: Diagnosis not present

## 2018-09-28 DIAGNOSIS — F411 Generalized anxiety disorder: Secondary | ICD-10-CM | POA: Diagnosis not present

## 2018-09-28 DIAGNOSIS — F331 Major depressive disorder, recurrent, moderate: Secondary | ICD-10-CM | POA: Diagnosis not present

## 2018-09-28 MED ORDER — LAMOTRIGINE 200 MG PO TABS: 200.0000 mg | ORAL_TABLET | Freq: Every day | ORAL | 2 refills | Status: DC

## 2018-09-28 MED ORDER — ALPRAZOLAM 0.5 MG PO TABS: 0.5000 mg | ORAL_TABLET | Freq: Every day | ORAL | 0 refills | Status: DC | PRN

## 2018-09-28 MED ORDER — CITALOPRAM HYDROBROMIDE 40 MG PO TABS: 40.0000 mg | ORAL_TABLET | Freq: Every day | ORAL | 2 refills | Status: DC

## 2018-09-28 MED ORDER — VILAZODONE HCL 10 MG PO TABS: 10.0000 mg | ORAL_TABLET | Freq: Every day | ORAL | 2 refills | Status: DC

## 2018-09-28 MED FILL — CITALOPRAM HBR 40 MG TABLET: 40 | 30 days supply | Qty: 30 | Fill #0

## 2018-09-28 MED FILL — VIIBRYD 10 MG TABLET: 10 | 30 days supply | Qty: 30 | Fill #0

## 2018-09-28 MED FILL — ALPRAZolam 0.5 MG TABS: 0.5 | 30 days supply | Qty: 30 | Fill #0

## 2018-09-28 MED FILL — lamoTRIgine 200 MG TABS: 200 | 30 days supply | Qty: 30 | Fill #0

## 2018-09-28 NOTE — Progress Notes (Signed)
Patient ID: Shannon [name], female   DOB: April 06, 1966, 52 y.o.   MRN: [id number]  [facility] Follow up visit  TEYONA NICHELSON [id number] 52 y.o.  09/28/2018 1:29 PM  Chief Complaint:  Depression follow up  History of Present Illness:   Patient Presents for follow up and medication management for Major depression and Generalized anxiety disorder.  Son struggling with 8th grade says he is at level of 5th grade Dad is in hospice  Anxious and stress due to above. 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    viibryd has helped depression.  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  Prior Suicide Attempts: No  Medical History; Past Medical History:  Diagnosis Date  . Anxiety   . Arthritis    back and hips   . Depression   . Diabetes mellitus without complication ([place])   . GERD (gastroesophageal reflux disease)   . History of hiatal hernia   . HPV (human papilloma virus) infection   . Hyperlipidemia   . Hypothyroidism   . PONV (postoperative nausea and vomiting)    slow to wake up   . Shortness of breath dyspnea    with exertion   . Sleep apnea    cpap -0 setting at 16   . Stress incontinence     Allergies: Allergies  Allergen Reactions  . Food Swelling    Big Red Gum makes her tongue swell  . Latuda [Lurasidone Hcl] Other (See Comments)    Severe aggitation  . Lipitor [Atorvastatin] Other (See Comments)    Muscle aches  . Belviq [Lorcaserin Hcl]     Increased appetitie  . Contrave [Naltrexone-Bupropion Hcl Er] Other (See Comments)    Sleepy.    Medications: Outpatient Encounter Medications as of 09/28/2018  Medication Sig  . ALPRAZolam (XANAX) 0.5 MG tablet Take 1 tablet (0.5  mg total) by mouth daily as needed for anxiety.  . baclofen (LIORESAL) 10 MG tablet Take 1 tablet (10 mg total) by mouth 3 (three) times daily as needed for muscle spasms.  . citalopram (CELEXA) 40 MG tablet Take 1 tablet (40 mg total) by mouth daily.  . Ferrous Sulfate (IRON) 325 (65 Fe) MG TABS Take 65 mg by mouth daily.  [place] lamoTRIgine (LAMICTAL) 200 MG tablet Take 1 tablet (200 mg total) by mouth daily.  . pregabalin (LYRICA) 75 MG capsule Take 1 capsule (75 mg total) by mouth 2 (two) times daily.  . Vilazodone HCl (VIIBRYD) 10 MG TABS Take 1 tablet (10 mg total) by mouth daily.  . [DISCONTINUED] ALPRAZolam (XANAX) 0.5 MG tablet Take 1 tablet (0.5 mg total) by mouth daily as needed for anxiety.  . [DISCONTINUED] citalopram (CELEXA) 40 MG tablet Take 1 tablet (40 mg total) by mouth daily.  . [DISCONTINUED] lamoTRIgine (LAMICTAL) 200 MG tablet Take 1 tablet (200 mg total) by mouth daily.  . [DISCONTINUED] Vilazodone HCl (VIIBRYD) 10 MG TABS Take 1 tablet (10 mg total) by mouth daily.   No facility-administered encounter medications on file as of 09/28/2018.      Family History; Family History  Problem Relation Age of Onset  . Hyperlipidemia Mother   . Sleep apnea Mother   . Depression Mother   .  Diabetes Father   . Hypertension Father   . COPD Father   . Diabetes Paternal Aunt   . Diabetes Paternal Uncle   . Alcohol abuse Paternal Uncle   . Alcohol abuse Paternal Grandfather   . Depression Sister       Labs:  No results found for this or any previous visit (from the past 2160 hour(s)).      Mental Status Examination;   Psychiatric Specialty Exam: Physical Exam  Constitutional: She appears well-developed and well-nourished. No distress.  Skin: She is not diaphoretic.    Review of Systems  Cardiovascular: Negative for palpitations.  Gastrointestinal: Negative for nausea.  Musculoskeletal: Positive for neck pain.  Skin: Negative for rash.  Neurological: Negative  for tremors and headaches.  Psychiatric/Behavioral: Negative for hallucinations. The patient is nervous/anxious and has insomnia.     Blood pressure 128/78, pulse 75, height 5\' 5"  (1.651 m), weight 218 lb (98.9 kg).Body mass index is 36.28 kg/m.  General Appearance: Casual  Eye Contact::  Fair  Speech:  Normal Rate  Volume:  Normal  Mood somewhat stressed  Affect:  Congruent  And reactive  Thought Process:  Coherent  Orientation:  Full (Time, Place, and Person)  Thought Content:  Rumination  Suicidal Thoughts:  No  Homicidal Thoughts:  No  Memory:  Immediate;   Fair Recent;   Fair  Judgement:  Fair  Insight:  Shallow  Psychomotor Activity:  Normal  Concentration:  Fair  Recall:  Fair  Akathisia:  Negative  Handed:  Right  AIMS (if indicated):     Assets:  Communication Skills Desire for Improvement Financial Resources/Insurance Housing  Sleep:        Assessment: Axis I: mood disorder unspecified. Rule out mood disorder secondary to general medical condition. Major depressive disorder recurrent moderate. Rule out panic disorder. Generalized anxiety disorder   Axis III:  Past Medical History:  Diagnosis Date  . Anxiety   . Arthritis    back and hips   . Depression   . Diabetes mellitus without complication ([place])   . GERD (gastroesophageal reflux disease)   . History of hiatal hernia   . HPV (human papilloma virus) infection   . Hyperlipidemia   . Hypothyroidism   . PONV (postoperative nausea and vomiting)    slow to wake up   . Shortness of breath dyspnea    with exertion   . Sleep apnea    cpap -0 setting at 16   . Stress incontinence     Axis IV: psychosocial   Treatment Plan and Summary:  Depression: somewhat stressed. Continue celexa, viibryd Anxiety:fluctuates. Continue celexa.  Xanax prn. Reviewed sleep hygiene. Wants to keep dose same for now and work on sleep hygiene as anxiety is high at that time  Panic attacks:infrequent. Takes prn  xanax  Bariatric surgery: recovering and loosing weight.   Fu with primary care regarding neck pain fU 2-3 months.  Merian Capron, MD 09/28/2018

## 2018-10-05 ENCOUNTER — Telehealth: Payer: Self-pay

## 2018-10-05 ENCOUNTER — Other Ambulatory Visit: Payer: Self-pay

## 2018-10-05 ENCOUNTER — Emergency Department (HOSPITAL_BASED_OUTPATIENT_CLINIC_OR_DEPARTMENT_OTHER): Payer: 59

## 2018-10-05 ENCOUNTER — Encounter (HOSPITAL_BASED_OUTPATIENT_CLINIC_OR_DEPARTMENT_OTHER): Payer: Self-pay | Admitting: Emergency Medicine

## 2018-10-05 ENCOUNTER — Emergency Department (HOSPITAL_BASED_OUTPATIENT_CLINIC_OR_DEPARTMENT_OTHER)
Admission: EM | Admit: 2018-10-05 | Discharge: 2018-10-05 | Disposition: A | Discharge status: Home / Self Care | Payer: 59 | Attending: Emergency Medicine | Admitting: Emergency Medicine

## 2018-10-05 DIAGNOSIS — M1711 Unilateral primary osteoarthritis, right knee: Secondary | ICD-10-CM

## 2018-10-05 DIAGNOSIS — E119 Type 2 diabetes mellitus without complications: Secondary | ICD-10-CM | POA: Diagnosis not present

## 2018-10-05 DIAGNOSIS — M25561 Pain in right knee: Secondary | ICD-10-CM | POA: Diagnosis not present

## 2018-10-05 DIAGNOSIS — Z79899 Other long term (current) drug therapy: Secondary | ICD-10-CM | POA: Diagnosis not present

## 2018-10-05 DIAGNOSIS — E039 Hypothyroidism, unspecified: Secondary | ICD-10-CM | POA: Diagnosis not present

## 2018-10-05 DIAGNOSIS — M179 Osteoarthritis of knee, unspecified: Secondary | ICD-10-CM | POA: Diagnosis not present

## 2018-10-05 MED ORDER — HYDROCODONE-ACETAMINOPHEN 5-325 MG PO TABS: 1.0000 | ORAL_TABLET | Freq: Four times a day (QID) | ORAL | 0 refills | Status: DC | PRN

## 2018-10-05 MED ORDER — KETOROLAC TROMETHAMINE 30 MG/ML IJ SOLN
30.0000 mg | Freq: Once | INTRAMUSCULAR | Status: AC
  Administered 2018-10-05: 30 mg via INTRAMUSCULAR
  Filled 2018-10-05: qty 1

## 2018-10-05 MED ORDER — NAPROXEN 500 MG PO TABS: 500.0000 mg | ORAL_TABLET | Freq: Two times a day (BID) | ORAL | 0 refills | Status: DC

## 2018-10-05 MED ORDER — HYDROCODONE-ACETAMINOPHEN 5-325 MG PO TABS
1.0000 | ORAL_TABLET | Freq: Once | ORAL | Status: AC
  Administered 2018-10-05: 1 via ORAL
  Filled 2018-10-05: qty 1

## 2018-10-05 MED FILL — HYDROCODON-APAP 5-325: 5-325 | 2 days supply | Qty: 6 | Fill #0

## 2018-10-05 MED FILL — NAPROXEN 500 MG TABLET: 500 | 15 days supply | Qty: 30 | Fill #0

## 2018-10-05 NOTE — ED Triage Notes (Signed)
Pt c/o right knee pain that significantly worsened tonight when she felt a popping sensation.

## 2018-10-05 NOTE — Telephone Encounter (Signed)
FYI  Shannon [name] has an appointment in the morning and also wants to talk about knee pain. She was seen in the ED.

## 2018-10-05 NOTE — ED Provider Notes (Signed)
[facility] EMERGENCY DEPARTMENT Provider Note   CSN: [id number] Arrival date & time: 10/05/18  0254     History   Chief Complaint Chief Complaint  Patient presents with  . Knee Pain    HPI Shannon [name] is a 52 y.o. female.  HPI  This is a 52 year old female who presents with right knee pain.  Patient reports she has had some right knee pain over the last week.  However she was at work when she stepped out of an [employer] and heard a pop.  She had significant worsening of pain.  She reports the pain radiated down the back of her right knee into her leg.  She currently rates her pain 8 out of 10.  She is not taking anything for pain.  It is worse with range of motion and ambulation.  Denies any recent fevers or redness.  Past Medical History:  Diagnosis Date  . Anxiety   . Arthritis    back and hips   . Depression   . Diabetes mellitus without complication ([place])   . GERD (gastroesophageal reflux disease)   . History of hiatal hernia   . HPV (human papilloma virus) infection   . Hyperlipidemia   . Hypothyroidism   . PONV (postoperative nausea and vomiting)    slow to wake up   . Shortness of breath dyspnea    with exertion   . Sleep apnea    cpap -0 setting at 16   . Stress incontinence     Patient Active Problem List   Diagnosis Date Noted  . Polyp of colon 05/21/2018  . Chest pain 05/09/2018  . BPPV (benign paroxysmal positional vertigo), right 05/09/2018  . Iron deficiency anemia secondary to inadequate dietary iron intake 03/25/2018  . 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 Fibroids 10/30/2016  . Cervical myofascial pain syndrome 07/31/2016  . Hiatal hernia 08/13/2015  . 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 Generalized anxiety disorder 03/16/2015  . DDD (degenerative disc disease), lumbar 08/28/2014  . Hypothyroidism 08/15/2014  . Diabetes mellitus type II, controlled ([place]) 08/15/2014  . OSA on CPAP 03/31/2014  . Abnormal weight gain 03/31/2014    . Sleep apnea 02/05/2012  . Mood disorder ([place]) 02/05/2012  . Hyperlipidemia 11/22/2008    Past Surgical History:  Procedure Laterality Date  . APPENDECTOMY    . BREATH TEK H PYLORI N/A 04/03/2015   Procedure: BREATH TEK H PYLORI;  Surgeon: Greer Pickerel, MD;  Location: [facility];  Service: General;  Laterality: N/A;  . CESAREAN SECTION    . 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   second interspace, left foot  . feet surgery    . LAPAROSCOPIC GASTRIC SLEEVE RESECTION WITH HIATAL HERNIA REPAIR N/A 08/13/2015   Procedure: LAPAROSCOPIC GASTRIC SLEEVE RESECTION WITH HIATAL HERNIA REPAIR;  Surgeon: Greer Pickerel, MD;  Location: [facility];  Service: General;  Laterality: N/A;  . laproscopy    . UPPER GI ENDOSCOPY  08/13/2015   Procedure: UPPER GI ENDOSCOPY;  Surgeon: Greer Pickerel, MD;  Location: [facility];  Service: General;;  . WISDOM TOOTH EXTRACTION       OB History    Gravida  1   Para  1   Term  1   Preterm      AB      Living  1     SAB      TAB  Ectopic      Multiple      Live Births               Home Medications    Prior to Admission medications   Medication Sig Start Date End Date Taking? Authorizing Provider  ALPRAZolam Duanne Moron) 0.5 MG tablet Take 1 tablet (0.5 mg total) by mouth daily as needed for anxiety. 09/28/18   Merian Capron, MD  baclofen (LIORESAL) 10 MG tablet Take 1 tablet (10 mg total) by mouth 3 (three) times daily as needed for muscle spasms. 06/24/18   Gregor Hams, MD  citalopram (CELEXA) 40 MG tablet Take 1 tablet (40 mg total) by mouth daily. 09/28/18   Merian Capron, MD  Ferrous Sulfate (IRON) 325 (65 Fe) MG TABS Take 65 mg by mouth daily.    [provider]  HYDROcodone-acetaminophen (NORCO/VICODIN) 5-325 MG tablet Take 1 tablet by mouth every 6 (six) hours as needed. 10/05/18   Rekia Kujala, Barbette Hair, MD  lamoTRIgine (LAMICTAL) 200 MG tablet Take 1  tablet (200 mg total) by mouth daily. 09/28/18   Merian Capron, MD  naproxen (NAPROSYN) 500 MG tablet Take 1 tablet (500 mg total) by mouth 2 (two) times daily. 10/05/18   Rasa Degrazia, Barbette Hair, MD  pregabalin (LYRICA) 75 MG capsule Take 1 capsule (75 mg total) by mouth 2 (two) times daily. 09/27/18   Silverio Decamp, MD  Vilazodone HCl (VIIBRYD) 10 MG TABS Take 1 tablet (10 mg total) by mouth daily. 09/28/18   Merian Capron, MD    Family History Family History  Problem Relation Age of Onset  . Hyperlipidemia Mother   . Sleep apnea Mother   . Depression Mother   . Diabetes Father   . Hypertension Father   . COPD Father   . Diabetes Paternal Aunt   . Diabetes Paternal Uncle   . Alcohol abuse Paternal Uncle   . Alcohol abuse Paternal Grandfather   . Depression Sister     Social History Social History   Tobacco Use  . Smoking status: Never Smoker  . Smokeless tobacco: Never Used  Substance Use Topics  . Alcohol use: No    Alcohol/week: 0.0 standard drinks  . Drug use: No     Allergies   Food; Anette Guarneri [lurasidone hcl]; Lipitor [atorvastatin]; Belviq [lorcaserin hcl]; and Contrave [naltrexone-bupropion hcl er]   Review of Systems Review of Systems  Constitutional: Negative for fever.  Musculoskeletal:       Right knee pain  Skin: Negative for color change.  Neurological: Negative for weakness and numbness.  All other systems reviewed and are negative.    Physical Exam Updated Vital Signs BP 111/71 (BP Location: Right Arm)   Pulse 80   Temp 98.5 F (36.9 C) (Oral)   Resp 20   Ht 1.664 m (5' 5.5")   Wt 98.9 kg   SpO2 97%   BMI 35.73 kg/m   Physical Exam Vitals signs and nursing note reviewed.  Constitutional:      Appearance: She is well-developed.  HENT:     Head: Normocephalic and atraumatic.  Cardiovascular:     Rate and Rhythm: Normal rate and regular rhythm.  Pulmonary:     Effort: Pulmonary effort is normal. No respiratory distress.    Musculoskeletal:     Comments: Limited flexion of the right knee, no joint line tenderness to palpation, no effusion or overlying skin changes, 2+ DP pulse  Skin:    General: Skin is warm and dry.  Neurological:     Mental Status: She is alert and oriented to person, place, and time.      ED Treatments / Results  Labs (all labs ordered are listed, but only abnormal results are displayed) Labs Reviewed - No data to display  EKG None  Radiology Dg Knee Complete 4 Views Right  Result Date: 10/05/2018 CLINICAL DATA:  Right knee pain for 2 days. EXAM: RIGHT KNEE - COMPLETE 4+ VIEW COMPARISON:  None. FINDINGS: No fracture or dislocation. Probable small joint effusion. Rounded calcifications posterior to the joint presumed ossified intra-articular bodies. Lateral tibiofemoral osteoarthritis with joint space narrowing and peripheral spurring. Small patellofemoral osteophytes. No bony destructive change. No focal soft tissue abnormality. IMPRESSION: Osteoarthritis with probable intra-articular bodies posteriorly. No acute osseous abnormality. Electronically Signed   By: Keith Rake M.D.   On: 10/05/2018 05:46    Procedures Procedures (including critical care time)  Medications Ordered in ED Medications  ketorolac (TORADOL) 30 MG/ML injection 30 mg (30 mg Intramuscular Given 10/05/18 0339)  HYDROcodone-acetaminophen (NORCO/VICODIN) 5-325 MG per tablet 1 tablet (1 tablet Oral Given 10/05/18 0609)     Initial Impression / Assessment and Plan / ED Course  I have reviewed the triage vital signs and the nursing notes.  Pertinent labs & imaging results that were available during my care of the patient were reviewed by me and considered in my medical decision making (see chart for details).     She presents with right knee pain.  She is overall nontoxic-appearing.  She has limited range of motion.  No signs or symptoms of septic arthritis.  Reports a pop with mis-stepping out of the  elevator.  X-rays show extensive arthritis.  She also could have meniscal or ligamentous injury.  She reports that she has a sports medicine appointment tomorrow.  We will have her follow-up with her sports medicine doctor.  Recommend ice, elevation, and anti-inflammatories in the meantime.  Patient stated understanding.  After history, exam, and medical workup I feel the patient has been appropriately medically screened and is safe for discharge home. Pertinent diagnoses were discussed with the patient. Patient was given return precautions.  Final Clinical Impressions(s) / ED Diagnoses   Final diagnoses:  Arthritis of right knee    ED Discharge Orders         Ordered    naproxen (NAPROSYN) 500 MG tablet  2 times daily     10/05/18 0611    HYDROcodone-acetaminophen (NORCO/VICODIN) 5-325 MG tablet  Every 6 hours PRN     10/05/18 0611           Merryl Hacker, MD 10/05/18 [phone]

## 2018-10-05 NOTE — Discharge Instructions (Addendum)
You were seen today for right knee pain.  You have extensive arthritis in the right knee.  You also may have injured a ligament or your meniscus.  Follow-up with your sports medicine doctor.  Keep iced and elevated.

## 2018-10-06 ENCOUNTER — Encounter: Payer: Self-pay | Admitting: Sports Medicine

## 2018-10-06 ENCOUNTER — Ambulatory Visit (INDEPENDENT_AMBULATORY_CARE_PROVIDER_SITE_OTHER): Payer: 59 | Admitting: Sports Medicine

## 2018-10-06 DIAGNOSIS — M7918 Myalgia, other site: Secondary | ICD-10-CM | POA: Diagnosis not present

## 2018-10-06 DIAGNOSIS — M170 Bilateral primary osteoarthritis of knee: Secondary | ICD-10-CM | POA: Diagnosis not present

## 2018-10-06 DIAGNOSIS — M1711 Unilateral primary osteoarthritis, right knee: Secondary | ICD-10-CM | POA: Insufficient documentation

## 2018-10-06 NOTE — Assessment & Plan Note (Addendum)
With intra-articular loose bodies. No current mechanical symptoms. Injection today. Reaction knee brace. Return to see me in a month, if persistent discomfort or mechanical symptoms we will get an MRI and refer for removal of the loose bodies.

## 2018-10-06 NOTE — Progress Notes (Signed)
Subjective:    I'm seeing this patient as a consultation for: Iran Planas, PA-C  CC: Right knee pain  HPI: This is a pleasant 52 year old female, I have been treating her for neck pain suspected to be myofascial pain syndrome, cervical spine MRI was for the most part unremarkable, we added Lyrica 75 and she has reported good resolution of her symptoms.  Unfortunately she has developed pain in her right knee, medial joint line, she took a misstep, felt a sharp pain.  She had pain, swelling, was seen in the emergency department, x-rays showed osteoarthritis with some intra-articular loose bodies.  Oral analgesics have not been effective and she is here for further evaluation and definitive treatment.  I reviewed the past medical history, family history, social history, surgical history, and allergies today and no changes were needed.  Please see the problem list section below in epic for further details.  Past Medical History: Past Medical History:  Diagnosis Date  . Anxiety   . Arthritis    back and hips   . Depression   . Diabetes mellitus without complication ([place])   . GERD (gastroesophageal reflux disease)   . History of hiatal hernia   . HPV (human papilloma virus) infection   . Hyperlipidemia   . Hypothyroidism   . PONV (postoperative nausea and vomiting)    slow to wake up   . Shortness of breath dyspnea    with exertion   . Sleep apnea    cpap -0 setting at 16   . Stress incontinence    Past Surgical History: Past Surgical History:  Procedure Laterality Date  . APPENDECTOMY    . BREATH TEK H PYLORI N/A 04/03/2015   Procedure: BREATH TEK H PYLORI;  Surgeon: Greer Pickerel, MD;  Location: [facility];  Service: General;  Laterality: N/A;  . CESAREAN SECTION    . 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   second interspace, left foot  . feet surgery    . LAPAROSCOPIC GASTRIC SLEEVE RESECTION  WITH HIATAL HERNIA REPAIR N/A 08/13/2015   Procedure: LAPAROSCOPIC GASTRIC SLEEVE RESECTION WITH HIATAL HERNIA REPAIR;  Surgeon: Greer Pickerel, MD;  Location: [facility];  Service: General;  Laterality: N/A;  . laproscopy    . UPPER GI ENDOSCOPY  08/13/2015   Procedure: UPPER GI ENDOSCOPY;  Surgeon: Greer Pickerel, MD;  Location: [facility];  Service: General;;  . WISDOM TOOTH EXTRACTION     Social History: Social History   Socioeconomic History  . Marital status: Divorced    Spouse name: Not on file  . Number of children: Not on file  . Years of education: Not on file  . Highest education level: Not on file  Occupational History  . Not on file  Social Needs  . Financial resource strain: Not on file  . Food insecurity:    Worry: Not on file    Inability: Not on file  . Transportation needs:    Medical: Not on file    Non-medical: Not on file  Tobacco Use  . Smoking status: Never Smoker  . Smokeless tobacco: Never Used  Substance and Sexual Activity  . Alcohol use: No    Alcohol/week: 0.0 standard drinks  . Drug use: No  . Sexual activity: Yes    Birth control/protection: Condom, I.U.D.    Comment: IUD inserted 12/12/16  Lifestyle  . Physical activity:    Days per week: Not on  file    Minutes per session: Not on file  . Stress: Not on file  Relationships  . Social connections:    Talks on phone: Not on file    Gets together: Not on file    Attends religious service: Not on file    Active member of club or organization: Not on file    Attends meetings of clubs or organizations: Not on file    Relationship status: Not on file  Other Topics Concern  . Not on file  Social History Narrative  . Not on file   Family History: Family History  Problem Relation Age of Onset  . Hyperlipidemia Mother   . Sleep apnea Mother   . Depression Mother   . Diabetes Father   . Hypertension Father   . COPD Father   . Diabetes Paternal Aunt   . Diabetes Paternal Uncle   . Alcohol abuse  Paternal Uncle   . Alcohol abuse Paternal Grandfather   . Depression Sister    Allergies: Allergies  Allergen Reactions  . Food Swelling    Big Red Gum makes her tongue swell  . Latuda [Lurasidone Hcl] Other (See Comments)    Severe aggitation  . Lipitor [Atorvastatin] Other (See Comments)    Muscle aches  . Belviq [Lorcaserin Hcl]     Increased appetitie  . Contrave [Naltrexone-Bupropion Hcl Er] Other (See Comments)    Sleepy.   Medications: See med rec.  Review of Systems: No headache, visual changes, nausea, vomiting, diarrhea, constipation, dizziness, abdominal pain, skin rash, fevers, chills, night sweats, weight loss, swollen lymph nodes, body aches, joint swelling, muscle aches, chest pain, shortness of breath, mood changes, visual or auditory hallucinations.   Objective:   General: Well Developed, well nourished, and in no acute distress.  Neuro:  Extra-ocular muscles intact, able to move all 4 extremities, sensation grossly intact.  Deep tendon reflexes tested were normal. Psych: Alert and oriented, mood congruent with affect. ENT:  Ears and nose appear unremarkable.  Hearing grossly normal. Neck: Unremarkable overall appearance, trachea midline.  No visible thyroid enlargement. Eyes: Conjunctivae and lids appear unremarkable.  Pupils equal and round. Skin: Warm and dry, no rashes noted.  Cardiovascular: Pulses palpable, no extremity edema. Right knee: Visibly swollen, minimal fullness with a mild effusion ROM normal in flexion and extension and lower leg rotation. Ligaments with solid consistent endpoints including ACL, PCL, LCL, MCL. Negative Mcmurray's and provocative meniscal tests. Non painful patellar compression. Patellar and quadriceps tendons unremarkable. Hamstring and quadriceps strength is normal.  Procedure: Real-time Ultrasound Guided aspiration/injection of right knee Device: GE Logiq E  Verbal informed consent obtained.  Time-out conducted.  Noted  no overlying erythema, induration, or other signs of local infection.  Skin prepped in a sterile fashion.  Local anesthesia: Topical Ethyl chloride.  With sterile technique and under real time ultrasound guidance: Aspirated 10 cc of clear, straw-colored fluid, syringe switched and 1 cc Kenalog 40, 2 cc lidocaine, 2 cc bupivacaine injected easily through an 18-gauge needle. Completed without difficulty  Pain immediately resolved suggesting accurate placement of the medication.  Advised to call if fevers/chills, erythema, induration, drainage, or persistent bleeding.  Images permanently stored and available for review in the ultrasound unit.  Impression: Technically successful ultrasound guided injection.  X-rays show osteoarthritis with intra-articular loose bodies.  Impression and Recommendations:   This case required medical decision making of moderate complexity.  Primary osteoarthritis of right knee With intra-articular loose bodies. No current mechanical symptoms. Injection  today. Reaction knee brace. Return to see me in a month, if persistent discomfort or mechanical symptoms we will get an MRI and refer for removal of the loose bodies.   Cervical myofascial pain syndrome Cervical spine MRI was negative, she had the mildest C6-C7 spondylosis, Celexa and Viibryd treatment per psychiatrist. We added Lyrica 75 mg at the last visit which seems to have controlled her symptoms well. Return as needed for this. ___________________________________________ Gwen Her. Dianah Field, M.D., ABFM., CAQSM. Primary Care and Sports Medicine [facility]  Adjunct Professor of [place] [employer] [facility] [employer]

## 2018-10-06 NOTE — Assessment & Plan Note (Signed)
Cervical spine MRI was negative, she had the mildest C6-C7 spondylosis, Celexa and Viibryd treatment per psychiatrist. We added Lyrica 75 mg at the last visit which seems to have controlled her symptoms well. Return as needed for this.

## 2018-11-03 ENCOUNTER — Encounter: Payer: Self-pay | Admitting: Sports Medicine

## 2018-11-03 ENCOUNTER — Ambulatory Visit (INDEPENDENT_AMBULATORY_CARE_PROVIDER_SITE_OTHER): Payer: 59 | Admitting: Sports Medicine

## 2018-11-03 DIAGNOSIS — M1711 Unilateral primary osteoarthritis, right knee: Secondary | ICD-10-CM | POA: Diagnosis not present

## 2018-11-03 DIAGNOSIS — G8929 Other chronic pain: Secondary | ICD-10-CM

## 2018-11-03 DIAGNOSIS — M25561 Pain in right knee: Secondary | ICD-10-CM | POA: Diagnosis not present

## 2018-11-03 NOTE — Progress Notes (Signed)
Subjective:    CC: Right knee pain  HPI: This is a pleasant 53 year old female with mild osteoarthritis, intra-articular loose bodies on x-Mcadams.  She only had minimal mechanical symptoms at the initial visit, we did inject her knee and she has noted partial relief, unfortunately she still has discomfort to the point where she cannot live with it, oral analgesics and NSAIDs are not sufficiently efficacious and she still has some mechanical symptoms.  I reviewed the past medical history, family history, social history, surgical history, and allergies today and no changes were needed.  Please see the problem list section below in epic for further details.  Past Medical History: Past Medical History:  Diagnosis Date  . Anxiety   . Arthritis    back and hips   . Depression   . Diabetes mellitus without complication ([place])   . GERD (gastroesophageal reflux disease)   . History of hiatal hernia   . HPV (human papilloma virus) infection   . Hyperlipidemia   . Hypothyroidism   . PONV (postoperative nausea and vomiting)    slow to wake up   . Shortness of breath dyspnea    with exertion   . Sleep apnea    cpap -0 setting at 16   . Stress incontinence    Past Surgical History: Past Surgical History:  Procedure Laterality Date  . APPENDECTOMY    . BREATH TEK H PYLORI N/A 04/03/2015   Procedure: BREATH TEK H PYLORI;  Surgeon: Greer Pickerel, MD;  Location: [facility];  Service: General;  Laterality: N/A;  . CESAREAN SECTION    . 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   second interspace, left foot  . feet surgery    . LAPAROSCOPIC GASTRIC SLEEVE RESECTION WITH HIATAL HERNIA REPAIR N/A 08/13/2015   Procedure: LAPAROSCOPIC GASTRIC SLEEVE RESECTION WITH HIATAL HERNIA REPAIR;  Surgeon: Greer Pickerel, MD;  Location: [facility];  Service: General;  Laterality: N/A;  . laproscopy    . UPPER GI ENDOSCOPY  08/13/2015   Procedure: UPPER GI ENDOSCOPY;  Surgeon: Greer Pickerel, MD;  Location: [facility];  Service: General;;  . WISDOM TOOTH EXTRACTION     Social History: Social History   Socioeconomic History  . Marital status: Divorced    Spouse name: Not on file  . Number of children: Not on file  . Years of education: Not on file  . Highest education level: Not on file  Occupational History  . Not on file  Social Needs  . Financial resource strain: Not on file  . Food insecurity:    Worry: Not on file    Inability: Not on file  . Transportation needs:    Medical: Not on file    Non-medical: Not on file  Tobacco Use  . Smoking status: Never Smoker  . Smokeless tobacco: Never Used  Substance and Sexual Activity  . Alcohol use: No    Alcohol/week: 0.0 standard drinks  . Drug use: No  . Sexual activity: Yes    Birth control/protection: Condom, I.U.D.    Comment: IUD inserted 12/12/16  Lifestyle  . Physical activity:    Days per week: Not on file    Minutes per session: Not on file  . Stress: Not on file  Relationships  . Social connections:    Talks on phone: Not on file    Gets together: Not on file    Attends religious service: Not on file  Active member of club or organization: Not on file    Attends meetings of clubs or organizations: Not on file    Relationship status: Not on file  Other Topics Concern  . Not on file  Social History Narrative  . Not on file   Family History: Family History  Problem Relation Age of Onset  . Hyperlipidemia Mother   . Sleep apnea Mother   . Depression Mother   . Diabetes Father   . Hypertension Father   . COPD Father   . Diabetes Paternal Aunt   . Diabetes Paternal Uncle   . Alcohol abuse Paternal Uncle   . Alcohol abuse Paternal Grandfather   . Depression Sister    Allergies: Allergies  Allergen Reactions  . Food Swelling    Big Red Gum makes her tongue swell  . Latuda [Lurasidone Hcl] Other (See Comments)    Severe aggitation  .  Lipitor [Atorvastatin] Other (See Comments)    Muscle aches  . Belviq [Lorcaserin Hcl]     Increased appetitie  . Contrave [Naltrexone-Bupropion Hcl Er] Other (See Comments)    Sleepy.   Medications: See med rec.  Review of Systems: No fevers, chills, night sweats, weight loss, chest pain, or shortness of breath.   Objective:    General: Well Developed, well nourished, and in no acute distress.  Neuro: Alert and oriented x3, extra-ocular muscles intact, sensation grossly intact.  HEENT: Normocephalic, atraumatic, pupils equal round reactive to light, neck supple, no masses, no lymphadenopathy, thyroid nonpalpable.  Skin: Warm and dry, no rashes. Cardiac: Regular rate and rhythm, no murmurs rubs or gallops, no lower extremity edema.  Respiratory: Clear to auscultation bilaterally. Not using accessory muscles, speaking in full sentences.  Impression and Recommendations:    Primary osteoarthritis of right knee Osteoarthritis with intra-articular loose bodies. We did an injection for some symptomatic relief, she had partial relief, minimal mechanical symptoms are still present. For this reason we do need an MRI and a referral for arthroscopy. She does understand that arthroscopy for osteoarthritis alone is not tremendously efficacious, but I think removing the intra-articular loose bodies would provide her with significant relief. She does prefer Dr. Alvan Dame with [facility]. ___________________________________________ Gwen Her. Dianah Field, M.D., ABFM., CAQSM. Primary Care and Sports Medicine [facility]  Adjunct Professor of [place] [employer] [facility] [employer] Medicine

## 2018-11-03 NOTE — Assessment & Plan Note (Signed)
Osteoarthritis with intra-articular loose bodies. We did an injection for some symptomatic relief, she had partial relief, minimal mechanical symptoms are still present. For this reason we do need an MRI and a referral for arthroscopy. She does understand that arthroscopy for osteoarthritis alone is not tremendously efficacious, but I think removing the intra-articular loose bodies would provide her with significant relief. She does prefer Dr. Alvan Dame with [facility].

## 2018-11-10 MED FILL — PREGABALIN 75 MG CAPS: 75 | 30 days supply | Qty: 60 | Fill #1

## 2018-11-15 ENCOUNTER — Ambulatory Visit (INDEPENDENT_AMBULATORY_CARE_PROVIDER_SITE_OTHER): Payer: 59

## 2018-11-15 DIAGNOSIS — M1711 Unilateral primary osteoarthritis, right knee: Secondary | ICD-10-CM

## 2018-11-15 DIAGNOSIS — M25561 Pain in right knee: Secondary | ICD-10-CM

## 2018-11-15 DIAGNOSIS — G8929 Other chronic pain: Secondary | ICD-10-CM

## 2018-11-18 ENCOUNTER — Other Ambulatory Visit (HOSPITAL_COMMUNITY): Payer: Self-pay | Admitting: Psychiatry

## 2018-11-18 MED FILL — lamoTRIgine 200 MG TABS: 200 | 30 days supply | Qty: 30 | Fill #1

## 2018-11-18 MED FILL — VIIBRYD 10 MG TABLET: 10 | 30 days supply | Qty: 30 | Fill #1

## 2018-11-18 MED FILL — CITALOPRAM HBR 40 MG TABLET: 40 | 30 days supply | Qty: 30 | Fill #1

## 2018-11-19 ENCOUNTER — Telehealth (HOSPITAL_COMMUNITY): Payer: Self-pay

## 2018-11-19 DIAGNOSIS — M25561 Pain in right knee: Secondary | ICD-10-CM | POA: Diagnosis not present

## 2018-11-19 MED ORDER — ALPRAZOLAM 0.5 MG PO TABS: 0.5000 mg | ORAL_TABLET | Freq: Every day | ORAL | 0 refills | Status: DC | PRN

## 2018-11-19 MED FILL — ALPRAZolam 0.5 MG TABS: 0.5 | 30 days supply | Qty: 30 | Fill #0

## 2018-11-19 NOTE — Telephone Encounter (Signed)
Refill sent.

## 2018-11-19 NOTE — Telephone Encounter (Signed)
Patient called requesting refill on Xanax. [facility]

## 2018-12-17 ENCOUNTER — Encounter: Payer: Self-pay | Admitting: Sports Medicine

## 2018-12-17 ENCOUNTER — Ambulatory Visit (INDEPENDENT_AMBULATORY_CARE_PROVIDER_SITE_OTHER): Payer: 59 | Admitting: Sports Medicine

## 2018-12-17 DIAGNOSIS — M1711 Unilateral primary osteoarthritis, right knee: Secondary | ICD-10-CM

## 2018-12-17 NOTE — Progress Notes (Signed)
Subjective:    CC: Here for FMLA paperwork  HPI: This is a pleasant 53 year old female, we have been treating [name] for knee osteoarthritis, she has had multiple modalities including injections, ultimately an MRI showed osteoarthritis with intra-articular loose bodies.  We referred [name] out shopping intervention.  She has been out of work for several occasions, and needs FMLA paperwork filled out.  I reviewed the past medical history, family history, social history, surgical history, and allergies today and no changes were needed.  Please see the problem list section below in epic for further details.  Past Medical History: Past Medical History:  Diagnosis Date  . Anxiety   . Arthritis    back and hips   . Depression   . Diabetes mellitus without complication ([place])   . GERD (gastroesophageal reflux disease)   . History of hiatal hernia   . HPV (human papilloma virus) infection   . Hyperlipidemia   . Hypothyroidism   . PONV (postoperative nausea and vomiting)    slow to wake up   . Shortness of breath dyspnea    with exertion   . Sleep apnea    cpap -0 setting at 16   . Stress incontinence    Past Surgical History: Past Surgical History:  Procedure Laterality Date  . APPENDECTOMY    . BREATH TEK H PYLORI N/A 04/03/2015   Procedure: BREATH TEK H PYLORI;  Surgeon: Greer Pickerel, MD;  Location: [facility];  Service: General;  Laterality: N/A;  . CESAREAN SECTION    . 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   second interspace, left foot  . feet surgery    . LAPAROSCOPIC GASTRIC SLEEVE RESECTION WITH HIATAL HERNIA REPAIR N/A 08/13/2015   Procedure: LAPAROSCOPIC GASTRIC SLEEVE RESECTION WITH HIATAL HERNIA REPAIR;  Surgeon: Greer Pickerel, MD;  Location: [facility];  Service: General;  Laterality: N/A;  . laproscopy    . UPPER GI ENDOSCOPY  08/13/2015   Procedure: UPPER GI ENDOSCOPY;  Surgeon: Greer Pickerel, MD;   Location: [facility];  Service: General;;  . WISDOM TOOTH EXTRACTION     Social History: Social History   Socioeconomic History  . Marital status: Divorced    Spouse name: Not on file  . Number of children: Not on file  . Years of education: Not on file  . Highest education level: Not on file  Occupational History  . Not on file  Social Needs  . Financial resource strain: Not on file  . Food insecurity:    Worry: Not on file    Inability: Not on file  . Transportation needs:    Medical: Not on file    Non-medical: Not on file  Tobacco Use  . Smoking status: Never Smoker  . Smokeless tobacco: Never Used  Substance and Sexual Activity  . Alcohol use: No    Alcohol/week: 0.0 standard drinks  . Drug use: No  . Sexual activity: Yes    Birth control/protection: Condom, I.U.D.    Comment: IUD inserted 12/12/16  Lifestyle  . Physical activity:    Days per week: Not on file    Minutes per session: Not on file  . Stress: Not on file  Relationships  . Social connections:    Talks on phone: Not on file    Gets together: Not on file    Attends religious service: Not on file    Active member of club or organization: Not  on file    Attends meetings of clubs or organizations: Not on file    Relationship status: Not on file  Other Topics Concern  . Not on file  Social History Narrative  . Not on file   Family History: Family History  Problem Relation Age of Onset  . Hyperlipidemia Mother   . Sleep apnea Mother   . Depression Mother   . Diabetes Father   . Hypertension Father   . COPD Father   . Diabetes Paternal Aunt   . Diabetes Paternal Uncle   . Alcohol abuse Paternal Uncle   . Alcohol abuse Paternal Grandfather   . Depression Sister    Allergies: Allergies  Allergen Reactions  . Food Swelling    Big Red Gum makes [name] tongue swell  . Latuda [Lurasidone Hcl] Other (See Comments)    Severe aggitation  . Lipitor [Atorvastatin] Other (See Comments)    Muscle aches    . Belviq [Lorcaserin Hcl]     Increased appetitie  . Contrave [Naltrexone-Bupropion Hcl Er] Other (See Comments)    Sleepy.   Medications: See med rec.  Review of Systems: No fevers, chills, night sweats, weight loss, chest pain, or shortness of breath.   Objective:    General: Well Developed, well nourished, and in no acute distress.  Neuro: Alert and oriented x3, extra-ocular muscles intact, sensation grossly intact.  HEENT: Normocephalic, atraumatic, pupils equal round reactive to light, neck supple, no masses, no lymphadenopathy, thyroid nonpalpable.  Skin: Warm and dry, no rashes. Cardiac: Regular rate and rhythm, no murmurs rubs or gallops, no lower extremity edema.  Respiratory: Clear to auscultation bilaterally. Not using accessory muscles, speaking in full sentences.  Impression and Recommendations:    Primary osteoarthritis of right knee FMLA paperwork filled out today. Further management per orthopedic surgery.  I spent 25 minutes with this patient, greater than 50% was face-to-face time counseling regarding the above diagnoses, specifically face-to-face filling out FMLA paperwork. ___________________________________________ Shannon [name]. Dianah Field, M.D., ABFM., CAQSM. Primary Care and Sports Medicine [facility]  Adjunct Professor of [employer] [facility] [employer] [employer]

## 2018-12-17 NOTE — Assessment & Plan Note (Signed)
FMLA paperwork filled out today. Further management per orthopedic surgery.

## 2018-12-22 ENCOUNTER — Ambulatory Visit (HOSPITAL_COMMUNITY): Payer: 59 | Admitting: Psychiatry

## 2018-12-24 MED FILL — VIIBRYD 10 MG TABLET: 10 | 30 days supply | Qty: 30 | Fill #2

## 2018-12-24 MED FILL — CITALOPRAM HBR 40 MG TABLET: 40 | 30 days supply | Qty: 30 | Fill #2

## 2018-12-24 MED FILL — PREGABALIN 75 MG CAPS: 75 | 30 days supply | Qty: 60 | Fill #2

## 2018-12-24 MED FILL — lamoTRIgine 200 MG TABS: 200 | 30 days supply | Qty: 30 | Fill #2

## 2019-01-05 ENCOUNTER — Other Ambulatory Visit: Payer: Self-pay

## 2019-01-05 ENCOUNTER — Encounter (HOSPITAL_COMMUNITY): Payer: Self-pay | Admitting: Psychiatry

## 2019-01-05 ENCOUNTER — Ambulatory Visit (INDEPENDENT_AMBULATORY_CARE_PROVIDER_SITE_OTHER): Payer: 59 | Admitting: Psychiatry

## 2019-01-05 ENCOUNTER — Telehealth: Payer: Self-pay | Admitting: Physician Assistant

## 2019-01-05 VITALS — BP 126/80 | HR 95 | Ht 65.5 in | Wt 217.0 lb

## 2019-01-05 DIAGNOSIS — F331 Major depressive disorder, recurrent, moderate: Secondary | ICD-10-CM | POA: Diagnosis not present

## 2019-01-05 DIAGNOSIS — F0630 Mood disorder due to known physiological condition, unspecified: Secondary | ICD-10-CM | POA: Diagnosis not present

## 2019-01-05 DIAGNOSIS — F411 Generalized anxiety disorder: Secondary | ICD-10-CM

## 2019-01-05 MED ORDER — ALPRAZOLAM 0.5 MG PO TABS: 0.5000 mg | ORAL_TABLET | Freq: Every day | ORAL | 0 refills | Status: DC | PRN

## 2019-01-05 MED ORDER — CITALOPRAM HYDROBROMIDE 40 MG PO TABS: 40.0000 mg | ORAL_TABLET | Freq: Every day | ORAL | 2 refills | Status: DC

## 2019-01-05 MED ORDER — LAMOTRIGINE 200 MG PO TABS: 200.0000 mg | ORAL_TABLET | Freq: Every day | ORAL | 2 refills | Status: DC

## 2019-01-05 MED ORDER — VILAZODONE HCL 10 MG PO TABS: 10.0000 mg | ORAL_TABLET | Freq: Every day | ORAL | 2 refills | Status: DC

## 2019-01-05 MED FILL — ALPRAZolam 0.5 MG TABS: 0.5 | 30 days supply | Qty: 30 | Fill #0

## 2019-01-05 NOTE — Telephone Encounter (Signed)
Patient had left a VM on Shannon [name]'s assistant's phone that she was having dizzy spells. She reports that they are better and she had some anti-dizzy pills and they helped make it better. She was advised to call back if the symptoms became worse and voices understanding.

## 2019-01-05 NOTE — Progress Notes (Signed)
Patient ID: Shannon [name], female   DOB: 05-12-1966, 53 y.o.   MRN: [id number]  [place] [facility] Follow up visit  Shannon [name] [id number] 53 y.o.  01/05/2019 10:28 AM  Chief Complaint:  Depression follow up  History of Present Illness:   Patient Presents for follow up and medication management for Major depression and Generalized anxiety disorder.  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  Also working on weekends.  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  Prior Suicide Attempts: No  Medical History; Past Medical History:  Diagnosis Date  . Anxiety   . Arthritis    back and hips   . Depression   . Diabetes mellitus without complication ([place])   . GERD (gastroesophageal reflux disease)   . History of hiatal hernia   . HPV (human papilloma virus) infection   . Hyperlipidemia   . Hypothyroidism   . PONV (postoperative nausea and vomiting)    slow to wake up   . Shortness of breath dyspnea    with exertion   . Sleep apnea    cpap -0 setting at 16   . Stress incontinence     Allergies: Allergies  Allergen Reactions  . Food Swelling    Big Red Gum makes her tongue swell  . Latuda [Lurasidone Hcl] Other (See Comments)    Severe aggitation  . Lipitor [Atorvastatin] Other (See Comments)    Muscle aches  . Belviq [Lorcaserin Hcl]     Increased appetitie  . Contrave [Naltrexone-Bupropion Hcl Er] Other (See Comments)    Sleepy.     Medications: Outpatient Encounter Medications as of 01/05/2019  Medication Sig  . ALPRAZolam (XANAX) 0.5 MG tablet Take 1 tablet (0.5 mg total) by mouth daily as needed for anxiety.  . baclofen (LIORESAL) 10 MG tablet Take 1 tablet (10 mg total) by mouth 3 (three) times daily as needed for muscle spasms.  . citalopram (CELEXA) 40 MG tablet Take 1 tablet (40 mg total) by mouth daily.  . Ferrous Sulfate (IRON) 325 (65 Fe) MG TABS Take 65 mg by mouth daily.  Marland Kitchen HYDROcodone-acetaminophen (NORCO/VICODIN) 5-325 MG tablet Take 1 tablet by mouth every 6 (six) hours as needed.  . lamoTRIgine (LAMICTAL) 200 MG tablet Take 1 tablet (200 mg total) by mouth daily.  . naproxen (NAPROSYN) 500 MG tablet Take 1 tablet (500 mg total) by mouth 2 (two) times daily.  . pregabalin (LYRICA) 75 MG capsule Take 1 capsule (75 mg total) by mouth 2 (two) times daily.  . Vilazodone HCl (VIIBRYD) 10 MG TABS Take 1 tablet (10 mg total) by mouth daily.  . [DISCONTINUED] ALPRAZolam (XANAX) 0.5 MG tablet Take 1 tablet (0.5 mg total) by mouth daily as needed for anxiety.  . [DISCONTINUED] citalopram (CELEXA) 40 MG tablet Take 1 tablet (40 mg total) by mouth daily.  . [DISCONTINUED] lamoTRIgine (LAMICTAL) 200 MG tablet Take 1 tablet (200 mg total) by mouth daily.  . [  DISCONTINUED] Vilazodone HCl (VIIBRYD) 10 MG TABS Take 1 tablet (10 mg total) by mouth daily.   No facility-administered encounter medications on file as of 01/05/2019.      Family History; Family History  Problem Relation Age of Onset  . Hyperlipidemia Mother   . Sleep apnea Mother   . Depression Mother   . Diabetes Father   . Hypertension Father   . COPD Father   . Diabetes Paternal Aunt   . Diabetes Paternal Uncle   . Alcohol abuse Paternal Uncle   . Alcohol abuse Paternal Grandfather   . Depression Sister       Labs:  No results found for this or any previous visit (from the past 2160 hour(s)).      Mental Status Examination;    Psychiatric Specialty Exam: Physical Exam  Constitutional: She appears well-developed and well-nourished. No distress.  Skin: She is not diaphoretic.    Review of Systems  Cardiovascular: Negative for palpitations.  Gastrointestinal: Negative for nausea.  Musculoskeletal: Positive for neck pain.  Skin: Negative for itching.  Neurological: Negative for tremors and headaches.  Psychiatric/Behavioral: Negative for hallucinations.    Blood pressure 126/80, pulse 95, height 5' 5.5" (1.664 m), weight 217 lb (98.4 kg).Body mass index is 35.56 kg/m.  General Appearance: Casual  Eye Contact::  Fair  Speech:  Normal Rate  Volume:  Normal  Mood somewhat stressed  Affect:  Congruent  And reactive  Thought Process:  Coherent  Orientation:  Full (Time, Place, and Person)  Thought Content:  Rumination  Suicidal Thoughts:  No  Homicidal Thoughts:  No  Memory:  Immediate;   Fair Recent;   Fair  Judgement:  Fair  Insight:  Shallow  Psychomotor Activity:  Normal  Concentration:  Fair  Recall:  Fair  Akathisia:  Negative  Handed:  Right  AIMS (if indicated):     Assets:  Communication Skills Desire for Improvement Financial Resources/Insurance Housing  Sleep:        Assessment: Axis I: mood disorder unspecified. Rule out mood disorder secondary to general medical condition. Major depressive disorder recurrent moderate. Rule out panic disorder. Generalized anxiety disorder   Axis III:  Past Medical History:  Diagnosis Date  . Anxiety   . Arthritis    back and hips   . Depression   . Diabetes mellitus without complication ([place])   . GERD (gastroesophageal reflux disease)   . History of hiatal hernia   . HPV (human papilloma virus) infection   . Hyperlipidemia   . Hypothyroidism   . PONV (postoperative nausea and vomiting)    slow to wake up   . Shortness of breath dyspnea    with exertion   . Sleep apnea    cpap -0 setting at 16   . Stress incontinence     Axis IV:  psychosocial   Treatment Plan and Summary:  Depression: somewhat subdued, continue vibryd. lamictal Anxiety:fluctuates. Continue celexa.  Xanax prn. Reviewed sleep hygiene. Wants to keep dose same for now and work on sleep hygiene as anxiety is high at that time  Panic attacks:infrequent. Takes prn xanax. Continue celexa Fu 46m or earlier, says would like to come in 70m.   Merian Capron, MD 01/05/2019

## 2019-01-11 ENCOUNTER — Telehealth (INDEPENDENT_AMBULATORY_CARE_PROVIDER_SITE_OTHER): Payer: Self-pay

## 2019-01-11 NOTE — Telephone Encounter (Signed)
Patient answered no to all prescreening questions

## 2019-01-12 ENCOUNTER — Ambulatory Visit (INDEPENDENT_AMBULATORY_CARE_PROVIDER_SITE_OTHER): Payer: 59 | Admitting: Orthopaedic Surgery

## 2019-01-12 ENCOUNTER — Encounter (INDEPENDENT_AMBULATORY_CARE_PROVIDER_SITE_OTHER): Payer: Self-pay | Admitting: Orthopaedic Surgery

## 2019-01-12 ENCOUNTER — Other Ambulatory Visit: Payer: Self-pay

## 2019-01-12 DIAGNOSIS — S83281A Other tear of lateral meniscus, current injury, right knee, initial encounter: Secondary | ICD-10-CM

## 2019-01-12 MED ORDER — METHYLPREDNISOLONE ACETATE 40 MG/ML IJ SUSP
40.0000 mg | INTRAMUSCULAR | Status: AC | PRN
  Administered 2019-01-12: 40 mg via INTRA_ARTICULAR

## 2019-01-12 MED ORDER — LIDOCAINE HCL 1 % IJ SOLN
3.0000 mL | INTRAMUSCULAR | Status: AC | PRN
  Administered 2019-01-12: 3 mL

## 2019-01-12 NOTE — Progress Notes (Addendum)
Office Visit Note   Patient: Shannon [name]           Date of Birth: 09-29-1966           MRN: [id number] Visit Date: 01/12/2019              Requested by: [name], PA-C [address] [place], [place] [address] PCP: [name], PA-C   Assessment & Plan: Visit Diagnoses:  1. Acute lateral meniscal tear, right, initial encounter     Plan: This does appear to be an acute meniscal tear of the lateral meniscus of the right knee.  We are recommending arthroscopic intervention.  She understands fully that she does have arthritic changes in her knee and this will not be helped by an arthroscopic intervention.  Hopefully though taking care of the meniscal tear as well as loose bodies can give her some more time with her knee.  With her working on weight loss and questioning exercises that will help as well.  However given the mechanical symptoms of her knee with a known meniscal tear which correlates with her clinical exam and subjective findings, an arthroscopic intervention is warranted at this standpoint.  I went over her studies with her in detail.  I showed her knee model and explained in detail what the surgery involves as well as her recommendation.  We talked about her intraoperative and postoperative course.  All question concerns were answered and addressed.  We will work on getting this surgery scheduled.  Since we are unable to schedule surgery in the near future given the coronavirus pandemic, I did offer steroid injection in her right knee and she agreed with this as well.  She tolerated the injection well.  Follow-Up Instructions: Return for 1 week post-op.   Orders:  No orders of the defined types were placed in this encounter.  No orders of the defined types were placed in this encounter.     Procedures: Large Joint Inj: R knee on 01/12/2019 11:37 AM Indications: diagnostic evaluation and pain Details: 22 G 1.5 in needle, superolateral approach   Arthrogram: No  Medications: 3 mL lidocaine 1 %; 40 mg methylPREDNISolone acetate 40 MG/ML Outcome: tolerated well, no immediate complications Procedure, treatment alternatives, risks and benefits explained, specific risks discussed. Consent was given by the patient. Immediately prior to procedure a time out was called to verify the correct patient, procedure, equipment, support staff and site/side marked as required. Patient was prepped and draped in the usual sterile fashion.       Clinical Data: No additional findings.   Subjective: Chief Complaint  Patient presents with  . Right Knee - Pain  The patient is a very pleasant 53 year old female who had an acute injury to her right knee in December of this past year.  She did have a steroid injection in the knee but it did not help at all.  This was done by a sports medicine physician.  He did not obtain an MRI of her right knee in January of this year.  She still has a lot of locking catching that knee and pain on the lateral aspect of the knee.  She is never injured this knee before.  She is someone who used to be morbidly obese but has lost a significant amount of weight.  She is never had knee issues before this incident in December.  The MRI showed a large lateral meniscal tear and some loose bodies in  the knee and she sent here for further evaluation and treatment as well as discussion of her knee.  It hurts mainly with pivoting activities.  She is not a diabetic.  HPI  Review of Systems She currently denies any headache, chest pain, shortness of breath, fever, chills, nausea, vomiting  Objective: Vital Signs: There were no vitals taken for this visit.  Physical Exam She is alert and orient x3 and in no acute distress Ortho Exam Examination of her left knee is normal examination of her right knee shows no significant malalignment of the knee.  There is a mild effusion.  She has a painful arc of motion of that knee.  There is  significant lateral joint line tenderness and a positive Murray sign to the lateral compartment. Specialty Comments:  No specialty comments available.  Imaging: No results found. The MRI is reviewed with her of her right knee.  There is subchondral edema in the medial femoral condyle and the medial tibial plateau.  There is a large horizontal meniscal tear and a meniscal root tear of the lateral meniscus.  There is loose bodies in the posterior aspect of her knee as well.  There is thinning of the articular cartilage throughout the knee but no full-thickness cartilage deficits.  PMFS History: Patient Active Problem List   Diagnosis Date Noted  . Primary osteoarthritis of right knee 10/06/2018  . Polyp of colon 05/21/2018  . Chest pain 05/09/2018  . BPPV (benign paroxysmal positional vertigo), right 05/09/2018  . Iron deficiency anemia secondary to inadequate dietary iron intake 03/25/2018  . 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 Fibroids 10/30/2016  . Cervical myofascial pain syndrome 07/31/2016  . Hiatal hernia 08/13/2015  . 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 Generalized anxiety disorder 03/16/2015  . DDD (degenerative disc disease), lumbar 08/28/2014  . Hypothyroidism 08/15/2014  . Diabetes mellitus type II, controlled ([place]) 08/15/2014  . OSA on CPAP 03/31/2014  . Abnormal weight gain 03/31/2014  . Sleep apnea 02/05/2012  . Mood disorder ([place]) 02/05/2012  . Hyperlipidemia 11/22/2008   Past Medical History:  Diagnosis Date  . Anxiety   . Arthritis    back and hips   . Depression   . Diabetes mellitus without complication ([place])   . GERD (gastroesophageal reflux disease)   . History of hiatal hernia   . HPV (human papilloma virus) infection   . Hyperlipidemia   . Hypothyroidism   . PONV (postoperative nausea and vomiting)    slow to wake up   . Shortness of breath dyspnea    with exertion   . Sleep apnea    cpap -0 setting at 16   . Stress incontinence      Family History  Problem Relation Age of Onset  . Hyperlipidemia Mother   . Sleep apnea Mother   . Depression Mother   . Diabetes Father   . Hypertension Father   . COPD Father   . Diabetes Paternal Aunt   . Diabetes Paternal Uncle   . Alcohol abuse Paternal Uncle   . Alcohol abuse Paternal Grandfather   . Depression Sister     Past Surgical History:  Procedure Laterality Date  . APPENDECTOMY    . BREATH TEK H PYLORI N/A 04/03/2015   Procedure: BREATH TEK H PYLORI;  Surgeon: Greer Pickerel, MD;  Location: [facility];  Service: General;  Laterality: N/A;  . CESAREAN SECTION    . 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   second interspace, left foot  . feet surgery    . LAPAROSCOPIC GASTRIC SLEEVE RESECTION WITH HIATAL HERNIA REPAIR N/A 08/13/2015   Procedure: LAPAROSCOPIC GASTRIC SLEEVE RESECTION WITH HIATAL HERNIA REPAIR;  Surgeon: Greer Pickerel, MD;  Location: [facility];  Service: General;  Laterality: N/A;  . laproscopy    . UPPER GI ENDOSCOPY  08/13/2015   Procedure: UPPER GI ENDOSCOPY;  Surgeon: Greer Pickerel, MD;  Location: [facility];  Service: General;;  . WISDOM TOOTH EXTRACTION     Social History   Occupational History  . Not on file  Tobacco Use  . Smoking status: Never Smoker  . Smokeless tobacco: Never Used  Substance and Sexual Activity  . Alcohol use: No    Alcohol/week: 0.0 standard drinks  . Drug use: No  . Sexual activity: Yes    Birth control/protection: Condom, I.U.D.    Comment: IUD inserted 12/12/16

## 2019-02-02 MED FILL — lamoTRIgine 200 MG TABS: 200 | 30 days supply | Qty: 30 | Fill #2

## 2019-02-02 MED FILL — PREGABALIN 75 MG CAPS: 75 | 30 days supply | Qty: 60 | Fill #3

## 2019-02-02 MED FILL — CITALOPRAM HBR 40 MG TABLET: 40 | 30 days supply | Qty: 30 | Fill #2

## 2019-02-02 MED FILL — VIIBRYD 10 MG TABLET: 10 | 30 days supply | Qty: 30 | Fill #1

## 2019-02-24 ENCOUNTER — Other Ambulatory Visit (HOSPITAL_COMMUNITY): Payer: Self-pay | Admitting: Psychiatry

## 2019-02-25 NOTE — Telephone Encounter (Signed)
Pharmacy sent request for Alprazolam. [facility] [place]

## 2019-03-02 ENCOUNTER — Telehealth (HOSPITAL_COMMUNITY): Payer: Self-pay

## 2019-03-02 MED ORDER — ALPRAZOLAM 0.5 MG PO TABS: 0.5000 mg | ORAL_TABLET | Freq: Every day | ORAL | 0 refills | Status: DC | PRN

## 2019-03-02 MED FILL — ALPRAZolam 0.5 MG TABS: 0.5 | 30 days supply | Qty: 30 | Fill #0

## 2019-03-02 NOTE — Telephone Encounter (Signed)
Patient called requesting a refill on Xanax. [facility]

## 2019-03-02 NOTE — Telephone Encounter (Signed)
sent 

## 2019-03-08 ENCOUNTER — Other Ambulatory Visit: Payer: Self-pay | Admitting: Physician Assistant

## 2019-03-09 ENCOUNTER — Other Ambulatory Visit: Payer: Self-pay

## 2019-03-09 ENCOUNTER — Encounter (HOSPITAL_BASED_OUTPATIENT_CLINIC_OR_DEPARTMENT_OTHER): Payer: Self-pay | Admitting: *Deleted

## 2019-03-10 ENCOUNTER — Other Ambulatory Visit: Payer: Self-pay | Admitting: Sports Medicine

## 2019-03-10 DIAGNOSIS — M7918 Myalgia, other site: Secondary | ICD-10-CM

## 2019-03-10 MED FILL — PREGABALIN 75 MG CAPS: 75 | 30 days supply | Qty: 60 | Fill #0

## 2019-03-10 MED FILL — VIIBRYD 10 MG TABLET: 10 | 30 days supply | Qty: 30 | Fill #2

## 2019-03-10 MED FILL — CITALOPRAM HBR 40 MG TABLET: 40 | 30 days supply | Qty: 30 | Fill #0

## 2019-03-10 MED FILL — lamoTRIgine 200 MG TABS: 200 | 30 days supply | Qty: 30 | Fill #0

## 2019-03-15 ENCOUNTER — Other Ambulatory Visit (HOSPITAL_COMMUNITY)
Admission: RE | Admit: 2019-03-15 | Discharge: 2019-03-15 | Disposition: A | Discharge status: Home / Self Care | Payer: 59 | Source: Ambulatory Visit | Attending: Orthopaedic Surgery | Admitting: Orthopaedic Surgery

## 2019-03-15 DIAGNOSIS — Z1159 Encounter for screening for other viral diseases: Secondary | ICD-10-CM | POA: Insufficient documentation

## 2019-03-15 LAB — SARS CORONAVIRUS 2 BY RT PCR (HOSPITAL ORDER, PERFORMED IN ~~LOC~~ HOSPITAL LAB): SARS Coronavirus 2: NEGATIVE

## 2019-03-17 ENCOUNTER — Ambulatory Visit (HOSPITAL_BASED_OUTPATIENT_CLINIC_OR_DEPARTMENT_OTHER): Payer: 59 | Admitting: Anesthesiology

## 2019-03-17 ENCOUNTER — Ambulatory Visit (HOSPITAL_BASED_OUTPATIENT_CLINIC_OR_DEPARTMENT_OTHER)
Admission: RE | Admit: 2019-03-17 | Discharge: 2019-03-17 | Disposition: A | Discharge status: Other Acute Inpatient Hospital | Payer: 59 | Attending: Orthopaedic Surgery | Admitting: Orthopaedic Surgery

## 2019-03-17 ENCOUNTER — Other Ambulatory Visit: Payer: Self-pay

## 2019-03-17 ENCOUNTER — Encounter (HOSPITAL_BASED_OUTPATIENT_CLINIC_OR_DEPARTMENT_OTHER): Payer: Self-pay | Admitting: Emergency Medicine

## 2019-03-17 ENCOUNTER — Encounter (HOSPITAL_BASED_OUTPATIENT_CLINIC_OR_DEPARTMENT_OTHER)
Admission: RE | Disposition: A | Discharge status: Other Acute Inpatient Hospital | Payer: Self-pay | Source: Home / Self Care | Attending: Orthopaedic Surgery

## 2019-03-17 DIAGNOSIS — X58XXXA Exposure to other specified factors, initial encounter: Secondary | ICD-10-CM | POA: Insufficient documentation

## 2019-03-17 DIAGNOSIS — R7303 Prediabetes: Secondary | ICD-10-CM | POA: Diagnosis not present

## 2019-03-17 DIAGNOSIS — M1990 Unspecified osteoarthritis, unspecified site: Secondary | ICD-10-CM | POA: Diagnosis not present

## 2019-03-17 DIAGNOSIS — E785 Hyperlipidemia, unspecified: Secondary | ICD-10-CM | POA: Diagnosis not present

## 2019-03-17 DIAGNOSIS — F419 Anxiety disorder, unspecified: Secondary | ICD-10-CM | POA: Insufficient documentation

## 2019-03-17 DIAGNOSIS — Z79899 Other long term (current) drug therapy: Secondary | ICD-10-CM | POA: Diagnosis not present

## 2019-03-17 DIAGNOSIS — G4730 Sleep apnea, unspecified: Secondary | ICD-10-CM | POA: Diagnosis not present

## 2019-03-17 DIAGNOSIS — M2341 Loose body in knee, right knee: Secondary | ICD-10-CM | POA: Diagnosis not present

## 2019-03-17 DIAGNOSIS — E119 Type 2 diabetes mellitus without complications: Secondary | ICD-10-CM | POA: Diagnosis not present

## 2019-03-17 DIAGNOSIS — K219 Gastro-esophageal reflux disease without esophagitis: Secondary | ICD-10-CM | POA: Diagnosis not present

## 2019-03-17 DIAGNOSIS — S83281A Other tear of lateral meniscus, current injury, right knee, initial encounter: Secondary | ICD-10-CM | POA: Insufficient documentation

## 2019-03-17 DIAGNOSIS — S83281D Other tear of lateral meniscus, current injury, right knee, subsequent encounter: Secondary | ICD-10-CM

## 2019-03-17 DIAGNOSIS — F329 Major depressive disorder, single episode, unspecified: Secondary | ICD-10-CM | POA: Diagnosis not present

## 2019-03-17 DIAGNOSIS — Z1159 Encounter for screening for other viral diseases: Secondary | ICD-10-CM | POA: Diagnosis not present

## 2019-03-17 HISTORY — DX: Prediabetes: R73.03

## 2019-03-17 HISTORY — DX: Other tear of lateral meniscus, current injury, unspecified knee, initial encounter: S83.289A

## 2019-03-17 HISTORY — PX: KNEE ARTHROSCOPY: SHX127

## 2019-03-17 SURGERY — ARTHROSCOPY, KNEE
Anesthesia: General | Laterality: Right

## 2019-03-17 MED ORDER — LIDOCAINE 2% (20 MG/ML) 5 ML SYRINGE
INTRAMUSCULAR | Status: AC
  Filled 2019-03-17: qty 5

## 2019-03-17 MED ORDER — PROPOFOL 10 MG/ML IV BOLUS
INTRAVENOUS | Status: DC | PRN
  Administered 2019-03-17: 200 mg via INTRAVENOUS

## 2019-03-17 MED ORDER — BUPIVACAINE HCL (PF) 0.25 % IJ SOLN
INTRAMUSCULAR | Status: DC | PRN
  Administered 2019-03-17: 20 mL

## 2019-03-17 MED ORDER — ONDANSETRON HCL 4 MG/2ML IJ SOLN
INTRAMUSCULAR | Status: DC | PRN
  Administered 2019-03-17: 4 mg via INTRAVENOUS

## 2019-03-17 MED ORDER — LACTATED RINGERS IV SOLN: INTRAVENOUS | Status: DC

## 2019-03-17 MED ORDER — CHLORHEXIDINE GLUCONATE 4 % EX LIQD: 60.0000 mL | Freq: Once | CUTANEOUS | Status: DC

## 2019-03-17 MED ORDER — MORPHINE SULFATE (PF) 4 MG/ML IV SOLN
INTRAVENOUS | Status: AC
  Filled 2019-03-17: qty 1

## 2019-03-17 MED ORDER — MIDAZOLAM HCL 2 MG/2ML IJ SOLN
1.0000 mg | INTRAMUSCULAR | Status: DC | PRN
  Administered 2019-03-17: 2 mg via INTRAVENOUS

## 2019-03-17 MED ORDER — CEFAZOLIN SODIUM-DEXTROSE 2-4 GM/100ML-% IV SOLN: 2.0000 g | INTRAVENOUS | Status: DC

## 2019-03-17 MED ORDER — PROPOFOL 500 MG/50ML IV EMUL
INTRAVENOUS | Status: AC
  Filled 2019-03-17: qty 50

## 2019-03-17 MED ORDER — LIDOCAINE HCL (PF) 1 % IJ SOLN
INTRAMUSCULAR | Status: AC
  Filled 2019-03-17: qty 5

## 2019-03-17 MED ORDER — DEXAMETHASONE SODIUM PHOSPHATE 10 MG/ML IJ SOLN
INTRAMUSCULAR | Status: AC
  Filled 2019-03-17: qty 1

## 2019-03-17 MED ORDER — BUPIVACAINE HCL (PF) 0.5 % IJ SOLN
INTRAMUSCULAR | Status: AC
  Filled 2019-03-17: qty 30

## 2019-03-17 MED ORDER — SCOPOLAMINE 1 MG/3DAYS TD PT72
MEDICATED_PATCH | TRANSDERMAL | Status: AC
  Filled 2019-03-17: qty 1

## 2019-03-17 MED ORDER — FENTANYL CITRATE (PF) 100 MCG/2ML IJ SOLN: 25.0000 ug | INTRAMUSCULAR | Status: DC | PRN

## 2019-03-17 MED ORDER — LIDOCAINE HCL (CARDIAC) PF 100 MG/5ML IV SOSY
PREFILLED_SYRINGE | INTRAVENOUS | Status: DC | PRN
  Administered 2019-03-17: 100 mg via INTRAVENOUS

## 2019-03-17 MED ORDER — HYDROCODONE-ACETAMINOPHEN 5-325 MG PO TABS: 1.0000 | ORAL_TABLET | Freq: Four times a day (QID) | ORAL | 0 refills | Status: DC | PRN

## 2019-03-17 MED ORDER — BUPIVACAINE HCL (PF) 0.25 % IJ SOLN
INTRAMUSCULAR | Status: AC
  Filled 2019-03-17: qty 30

## 2019-03-17 MED ORDER — METOCLOPRAMIDE HCL 5 MG/ML IJ SOLN: 10.0000 mg | Freq: Once | INTRAMUSCULAR | Status: DC | PRN

## 2019-03-17 MED ORDER — SODIUM CHLORIDE 0.9 % IR SOLN
Status: DC | PRN
  Administered 2019-03-17: 1

## 2019-03-17 MED ORDER — FENTANYL CITRATE (PF) 100 MCG/2ML IJ SOLN
INTRAMUSCULAR | Status: AC
  Filled 2019-03-17: qty 2

## 2019-03-17 MED ORDER — DEXAMETHASONE SODIUM PHOSPHATE 4 MG/ML IJ SOLN
INTRAMUSCULAR | Status: DC | PRN
  Administered 2019-03-17: 4 mg via INTRAVENOUS

## 2019-03-17 MED ORDER — LACTATED RINGERS IV SOLN
INTRAVENOUS | Status: DC
  Administered 2019-03-17: 07:00:00 via INTRAVENOUS

## 2019-03-17 MED ORDER — FENTANYL CITRATE (PF) 100 MCG/2ML IJ SOLN
50.0000 ug | INTRAMUSCULAR | Status: DC | PRN
  Administered 2019-03-17: 100 ug via INTRAVENOUS

## 2019-03-17 MED ORDER — KETOROLAC TROMETHAMINE 30 MG/ML IJ SOLN
INTRAMUSCULAR | Status: DC | PRN
  Administered 2019-03-17: 30 mg via INTRAVENOUS

## 2019-03-17 MED ORDER — MEPERIDINE HCL 25 MG/ML IJ SOLN: 6.2500 mg | INTRAMUSCULAR | Status: DC | PRN

## 2019-03-17 MED ORDER — LIDOCAINE HCL (PF) 1 % IJ SOLN
INTRAMUSCULAR | Status: AC
  Filled 2019-03-17: qty 30

## 2019-03-17 MED ORDER — SCOPOLAMINE 1 MG/3DAYS TD PT72
1.0000 | MEDICATED_PATCH | Freq: Once | TRANSDERMAL | Status: AC | PRN
  Administered 2019-03-17: 07:00:00 1.5 mg via TRANSDERMAL
  Administered 2019-03-17: 1 via TRANSDERMAL

## 2019-03-17 MED ORDER — ONDANSETRON HCL 4 MG/2ML IJ SOLN
INTRAMUSCULAR | Status: AC
  Filled 2019-03-17: qty 2

## 2019-03-17 MED ORDER — MIDAZOLAM HCL 2 MG/2ML IJ SOLN
INTRAMUSCULAR | Status: AC
  Filled 2019-03-17: qty 2

## 2019-03-17 MED FILL — HYDROCODON-APAP 5-325: 5-325 | 5 days supply | Qty: 40 | Fill #0

## 2019-03-17 SURGICAL SUPPLY — 35 items
BANDAGE ACE 6X5 VEL STRL LF (GAUZE/BANDAGES/DRESSINGS) ×3 IMPLANT
BLADE CUDA SHAVER 3.5 (BLADE) IMPLANT
BLADE EXCALIBUR 4.0MM X 13CM (MISCELLANEOUS)
BLADE EXCALIBUR 4.0X13 (MISCELLANEOUS) IMPLANT
COVER WAND RF STERILE (DRAPES) IMPLANT
DISSECTOR  3.8MM X 13CM (MISCELLANEOUS) ×2
DISSECTOR 3.8MM X 13CM (MISCELLANEOUS) ×1 IMPLANT
DRAPE ARTHROSCOPY W/POUCH 90 (DRAPES) ×30 IMPLANT
DRAPE U-SHAPE 47X51 STRL (DRAPES) ×3 IMPLANT
DRSG PAD ABDOMINAL 8X10 ST (GAUZE/BANDAGES/DRESSINGS) ×3 IMPLANT
DURAPREP 26ML APPLICATOR (WOUND CARE) ×3 IMPLANT
ELECT MENISCUS 165MM 90D (ELECTRODE) IMPLANT
ELECT REM PT RETURN 9FT ADLT (ELECTROSURGICAL)
ELECTRODE REM PT RTRN 9FT ADLT (ELECTROSURGICAL) IMPLANT
EXCALIBUR 3.8MM X 13CM (MISCELLANEOUS) IMPLANT
GAUZE SPONGE 4X4 12PLY STRL (GAUZE/BANDAGES/DRESSINGS) ×3 IMPLANT
GAUZE XEROFORM 1X8 LF (GAUZE/BANDAGES/DRESSINGS) ×3 IMPLANT
GLOVE BIOGEL PI IND STRL 8 (GLOVE) ×2 IMPLANT
GLOVE BIOGEL PI INDICATOR 8 (GLOVE) ×4
GLOVE ORTHO TXT STRL SZ7.5 (GLOVE) ×3 IMPLANT
GLOVE SURG ORTHO 8.0 STRL STRW (GLOVE) ×3 IMPLANT
GOWN STRL REUS W/ TWL LRG LVL3 (GOWN DISPOSABLE) ×2 IMPLANT
GOWN STRL REUS W/ TWL XL LVL3 (GOWN DISPOSABLE) ×1 IMPLANT
GOWN STRL REUS W/TWL LRG LVL3 (GOWN DISPOSABLE) ×4
GOWN STRL REUS W/TWL XL LVL3 (GOWN DISPOSABLE) ×2
KNEE WRAP E Z 3 GEL PACK (MISCELLANEOUS) ×3 IMPLANT
MANIFOLD NEPTUNE II (INSTRUMENTS) ×3 IMPLANT
PACK ARTHROSCOPY DSU (CUSTOM PROCEDURE TRAY) ×3 IMPLANT
PACK BASIN DAY SURGERY FS (CUSTOM PROCEDURE TRAY) ×3 IMPLANT
PADDING CAST COTTON 6X4 STRL (CAST SUPPLIES) ×3 IMPLANT
PENCIL BUTTON HOLSTER BLD 10FT (ELECTRODE) IMPLANT
SUT ETHILON 3 0 PS 1 (SUTURE) ×3 IMPLANT
TOWEL GREEN STERILE FF (TOWEL DISPOSABLE) ×3 IMPLANT
TUBING ARTHROSCOPY IRRIG 16FT (MISCELLANEOUS) ×3 IMPLANT
WATER STERILE IRR 1000ML POUR (IV SOLUTION) IMPLANT

## 2019-03-17 NOTE — Brief Op Note (Signed)
03/17/2019  8:09 AM  PATIENT:  Shannon [name]  54 y.o. female  PRE-OPERATIVE DIAGNOSIS:  right knee lateral meniscal tear and loose bodies  POST-OPERATIVE DIAGNOSIS:  right knee lateral meniscal tear and loose bodies  PROCEDURE:  Procedure(s): RIGHT KNEE ARTHROSCOPY WITH PARTIAL LATERAL MENISCECTOMY, REMOVAL OF LOOSE BODIES, 3 COMPARTMENT CHONDROPLASTY (Right)  SURGEON:  Surgeon(s) and Role:    Mcarthur Rossetti, MD - Primary  PHYSICIAN ASSISTANT: Benita Stabile, PA-C  ANESTHESIA:   local and general  EBL:  minimal  COUNTS:  YES  TOURNIQUET: none  DICTATION: .Other Dictation: Dictation Number [id number]  PLAN OF CARE: Discharge to home after PACU  PATIENT DISPOSITION:  PACU - hemodynamically stable.   Delay start of Pharmacological VTE agent (>24hrs) due to surgical blood loss or risk of bleeding: no

## 2019-03-17 NOTE — Discharge Instructions (Signed)
Increase your activities as comfort allows. You may put all of your weight on your right leg. Expect swelling - ice and elevation as needed over the next 2-3 days. You can remove your dressings tomorrow and get your incisions wet in the shower daily. Place band-aids over your incisions daily after your shower. Do pump your feet occasionally thru out the day.    Post Anesthesia Home Care Instructions  Activity: Get plenty of rest for the remainder of the day. A responsible individual must stay with you for 24 hours following the procedure.  For the next 24 hours, DO NOT: -Drive a car -Paediatric nurse -Drink alcoholic beverages -Take any medication unless instructed by your physician -Make any legal decisions or sign important papers.  Meals: Start with liquid foods such as gelatin or soup. Progress to regular foods as tolerated. Avoid greasy, spicy, heavy foods. If nausea and/or vomiting occur, drink only clear liquids until the nausea and/or vomiting subsides. Call your physician if vomiting continues.  Special Instructions/Symptoms: Your throat may feel dry or sore from the anesthesia or the breathing tube placed in your throat during surgery. If this causes discomfort, gargle with warm salt water. The discomfort should disappear within 24 hours.  If you had a scopolamine patch placed behind your ear for the management of post- operative nausea and/or vomiting:  1. The medication in the patch is effective for 72 hours, after which it should be removed.  Wrap patch in a tissue and discard in the trash. Wash hands thoroughly with soap and water. 2. You may remove the patch earlier than 72 hours if you experience unpleasant side effects which may include dry mouth, dizziness or visual disturbances. 3. Avoid touching the patch. Wash your hands with soap and water after contact with the patch.

## 2019-03-17 NOTE — H&P (Signed)
Shannon [name] is an 53 y.o. female.   Chief Complaint: right knee pain, locking and catching HPI:  53 yo female with right knee pain, locking, and catching.  After the failure of conservative treatment, a MRI was obtained and shows a right lateral meniscal tear with thinning of the cartilage in the lateral aspect of her knee.  Surgery has been recommended at this point.  Past Medical History:  Diagnosis Date  . Anxiety   . Arthritis    back and hips   . Depression   . GERD (gastroesophageal reflux disease)   . HPV (human papilloma virus) infection   . Hyperlipidemia   . Lateral meniscus tear    left knee  . PONV (postoperative nausea and vomiting)    slow to wake up   . Pre-diabetes   . Shortness of breath dyspnea    with exertion   . Sleep apnea    cpap -0 setting at 16, uses CPAP nightly  . Stress incontinence     Past Surgical History:  Procedure Laterality Date  . APPENDECTOMY    . BREATH TEK H PYLORI N/A 04/03/2015   Procedure: BREATH TEK H PYLORI;  Surgeon: Greer Pickerel, MD;  Location: [facility];  Service: General;  Laterality: N/A;  . CESAREAN SECTION    . 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   second interspace, left foot  . feet surgery    . LAPAROSCOPIC GASTRIC SLEEVE RESECTION WITH HIATAL HERNIA REPAIR N/A 08/13/2015   Procedure: LAPAROSCOPIC GASTRIC SLEEVE RESECTION WITH HIATAL HERNIA REPAIR;  Surgeon: Greer Pickerel, MD;  Location: [facility];  Service: General;  Laterality: N/A;  . laproscopy    . UPPER GI ENDOSCOPY  08/13/2015   Procedure: UPPER GI ENDOSCOPY;  Surgeon: Greer Pickerel, MD;  Location: [facility];  Service: General;;  . WISDOM TOOTH EXTRACTION      Family History  Problem Relation Age of Onset  . Hyperlipidemia Mother   . Sleep apnea Mother   . Depression Mother   . Diabetes Father   . Hypertension Father   . COPD Father   . Diabetes Paternal Aunt   . Diabetes Paternal Uncle    . Alcohol abuse Paternal Uncle   . Alcohol abuse Paternal Grandfather   . Depression Sister    Social History:  reports that she has never smoked. She has never used smokeless tobacco. She reports that she does not drink alcohol or use drugs.  Allergies:  Allergies  Allergen Reactions  . Food Swelling    Big Red Gum makes her tongue swell  . Latuda [Lurasidone Hcl] Other (See Comments)    Severe aggitation  . Lipitor [Atorvastatin] Other (See Comments)    Muscle aches  . Belviq [Lorcaserin Hcl]     Increased appetitie  . Contrave [Naltrexone-Bupropion Hcl Er] Other (See Comments)    Sleepy.    Medications Prior to Admission  Medication Sig Dispense Refill  . ALPRAZolam (XANAX) 0.5 MG tablet Take 1 tablet (0.5 mg total) by mouth daily as needed for anxiety. 30 tablet 0  . baclofen (LIORESAL) 10 MG tablet Take 1 tablet (10 mg total) by mouth 3 (three) times daily as needed for muscle spasms. 45 each 2  . citalopram (CELEXA) 40 MG tablet Take 1 tablet (40 mg total) by mouth daily. 30 tablet 2  . Ferrous Sulfate (IRON) 325 (65 Fe) MG TABS Take 65 mg by mouth daily.    Marland Kitchen  lamoTRIgine (LAMICTAL) 200 MG tablet Take 1 tablet (200 mg total) by mouth daily. 30 tablet 2  . pregabalin (LYRICA) 75 MG capsule TAKE 1 CAPSULE (75 MG TOTAL) BY MOUTH 2 (TWO) TIMES DAILY. 60 capsule 3  . Vilazodone HCl (VIIBRYD) 10 MG TABS Take 1 tablet (10 mg total) by mouth daily. 30 tablet 2    Results for orders placed or performed during the hospital encounter of 03/15/19 (from the past 48 hour(s))  SARS Coronavirus 2 (CEPHEID - Performed in [facility] hospital lab), Hosp Order     Status: None   Collection Time: 03/15/19  9:11 AM  Result Value Ref Range   SARS Coronavirus 2 NEGATIVE NEGATIVE    Comment: (NOTE) If result is NEGATIVE SARS-CoV-2 target nucleic acids are NOT DETECTED. The SARS-CoV-2 RNA is generally detectable in upper and lower  respiratory specimens during the acute phase of infection.  The lowest  concentration of SARS-CoV-2 viral copies this assay can detect is 250  copies / mL. A negative result does not preclude SARS-CoV-2 infection  and should not be used as the sole basis for treatment or other  patient management decisions.  A negative result may occur with  improper specimen collection / handling, submission of specimen other  than nasopharyngeal swab, presence of viral mutation(s) within the  areas targeted by this assay, and inadequate number of viral copies  (<250 copies / mL). A negative result must be combined with clinical  observations, patient history, and epidemiological information. If result is POSITIVE SARS-CoV-2 target nucleic acids are DETECTED. The SARS-CoV-2 RNA is generally detectable in upper and lower  respiratory specimens dur ing the acute phase of infection.  Positive  results are indicative of active infection with SARS-CoV-2.  Clinical  correlation with patient history and other diagnostic information is  necessary to determine patient infection status.  Positive results do  not rule out bacterial infection or co-infection with other viruses. If result is PRESUMPTIVE POSTIVE SARS-CoV-2 nucleic acids MAY BE PRESENT.   A presumptive positive result was obtained on the submitted specimen  and confirmed on repeat testing.  While 2019 novel coronavirus  (SARS-CoV-2) nucleic acids may be present in the submitted sample  additional confirmatory testing may be necessary for epidemiological  and / or clinical management purposes  to differentiate between  SARS-CoV-2 and other Sarbecovirus currently known to infect humans.  If clinically indicated additional testing with an alternate test  methodology [phone]) is advised. The SARS-CoV-2 RNA is generally  detectable in upper and lower respiratory sp ecimens during the acute  phase of infection. The expected result is Negative. Fact Sheet for Patients:   StrictlyIdeas.no Fact Sheet for Healthcare Providers: BankingDealers.co.za This test is not yet approved or cleared by the [place] FDA and has been authorized for detection and/or diagnosis of SARS-CoV-2 by FDA under an Emergency Use Authorization (EUA).  This EUA will remain in effect (meaning this test can be used) for the duration of the COVID-19 declaration under Section 564(b)(1) of the Act, 21 U.S.C. section 360bbb-3(b)(1), unless the authorization is terminated or revoked sooner. Performed at [facility], [place] [address] [place], [place] [address]    No results found.  Review of Systems  All other systems reviewed and are negative.   Blood pressure (!) 142/81, pulse 83, temperature 99.3 F (37.4 C), temperature source Oral, resp. rate 16, height 5\' 5"  (1.651 m), weight 99.3 kg, SpO2 98 %. Physical Exam  Constitutional: She is oriented to  person, place, and time. She appears well-developed and well-nourished.  HENT:  Head: Normocephalic and atraumatic.  Eyes: Pupils are equal, round, and reactive to light. EOM are normal.  Neck: Normal range of motion. Neck supple.  Cardiovascular: Normal rate.  Respiratory: Effort normal.  GI: Soft.  Musculoskeletal:     Right knee: She exhibits decreased range of motion, swelling, effusion and abnormal meniscus. Tenderness found. Lateral joint line tenderness noted.  Neurological: She is alert and oriented to person, place, and time.  Skin: Skin is warm and dry.  Psychiatric: She has a normal mood and affect.     Assessment/Plan Right knee lateral meniscal tear  To the OR today as an outpatient for a right knee diagnostic and hopefully therapeutic arthroscopy.  The risks and benefits have been explained in detail and informed consent is obtained.  Mcarthur Rossetti, MD 03/17/2019, 7:10 AM

## 2019-03-17 NOTE — Transfer of Care (Signed)
Immediate Anesthesia Transfer of Care Note  Patient: Shannon [name]  Procedure(s) Performed: RIGHT KNEE ARTHROSCOPY WITH PARTIAL LATERAL MENISCECTOMY, REMOVAL OF LOOSE BODIES, 3 COMPARTMENT CHONDROPLASTY (Right )  Patient Location: PACU  Anesthesia Type:General  Level of Consciousness: sedated  Airway & Oxygen Therapy: Patient Spontanous Breathing and Patient connected to nasal cannula oxygen  Post-op Assessment: Report given to RN and Post -op Vital signs reviewed and stable  Post vital signs: Reviewed and stable  Last Vitals:  Vitals Value Taken Time  BP 107/76 03/17/2019  8:11 AM  Temp    Pulse 74 03/17/2019  8:15 AM  Resp 13 03/17/2019  8:15 AM  SpO2 96 % 03/17/2019  8:15 AM  Vitals shown include unvalidated device data.  Last Pain:  Vitals:   03/17/19 0642  TempSrc: Oral  PainSc: 0-No pain         Complications: No apparent anesthesia complications

## 2019-03-17 NOTE — Anesthesia Procedure Notes (Signed)
Procedure Name: LMA Insertion Date/Time: 03/17/2019 7:32 AM Performed by: Maryella Shivers, CRNA Pre-anesthesia Checklist: Patient identified, Emergency Drugs available, Suction available and Patient being monitored Patient Re-evaluated:Patient Re-evaluated prior to induction Oxygen Delivery Method: Circle system utilized Preoxygenation: Pre-oxygenation with 100% oxygen Induction Type: IV induction Ventilation: Mask ventilation without difficulty LMA: LMA inserted LMA Size: 4.0 Number of attempts: 1 Airway Equipment and Method: Bite block Placement Confirmation: positive ETCO2 Tube secured with: Tape Dental Injury: Teeth and Oropharynx as per pre-operative assessment

## 2019-03-17 NOTE — Anesthesia Postprocedure Evaluation (Signed)
Anesthesia Post Note  Patient: Shannon [name]  Procedure(s) Performed: RIGHT KNEE ARTHROSCOPY WITH PARTIAL LATERAL MENISCECTOMY, REMOVAL OF LOOSE BODIES, 3 COMPARTMENT CHONDROPLASTY (Right )     Patient location during evaluation: PACU Anesthesia Type: General Level of consciousness: awake and alert Pain management: pain level controlled Vital Signs Assessment: post-procedure vital signs reviewed and stable Respiratory status: spontaneous breathing, nonlabored ventilation, respiratory function stable and patient connected to nasal cannula oxygen Cardiovascular status: blood pressure returned to baseline and stable Postop Assessment: no apparent nausea or vomiting Anesthetic complications: no    Last Vitals:  Vitals:   03/17/19 0845 03/17/19 0906  BP: (!) 144/99 136/90  Pulse: 88 84  Resp: 16 16  Temp:  36.9 C  SpO2: 100% 97%    Last Pain:  Vitals:   03/17/19 0906  TempSrc: Oral  PainSc: 3                  Montez Hageman

## 2019-03-17 NOTE — Anesthesia Preprocedure Evaluation (Signed)
Anesthesia Evaluation  Patient identified by MRN, date of birth, ID band Patient awake    Reviewed: Allergy & Precautions, NPO status , Patient's Chart, lab work & pertinent test results  History of Anesthesia Complications (+) PONV  Airway Mallampati: II  TM Distance: >3 FB Neck ROM: Full    Dental no notable dental hx.    Pulmonary sleep apnea and Continuous Positive Airway Pressure Ventilation ,    Pulmonary exam normal breath sounds clear to auscultation       Cardiovascular negative cardio ROS Normal cardiovascular exam Rhythm:Regular Rate:Normal     Neuro/Psych negative neurological ROS  negative psych ROS   GI/Hepatic Neg liver ROS, GERD  Controlled,  Endo/Other  negative endocrine ROS  Renal/GU negative Renal ROS  negative genitourinary   Musculoskeletal negative musculoskeletal ROS (+)   Abdominal   Peds negative pediatric ROS (+)  Hematology negative hematology ROS (+)   Anesthesia Other Findings   Reproductive/Obstetrics negative OB ROS                             Anesthesia Physical Anesthesia Plan  ASA: II  Anesthesia Plan: General   Post-op Pain Management:    Induction: Intravenous  PONV Risk Score and Plan: 4 or greater and Ondansetron, Dexamethasone, Midazolam and Scopolamine patch - Pre-op  Airway Management Planned: LMA  Additional Equipment:   Intra-op Plan:   Post-operative Plan: Extubation in OR  Informed Consent: I have reviewed the patients History and Physical, chart, labs and discussed the procedure including the risks, benefits and alternatives for the proposed anesthesia with the patient or authorized representative who has indicated his/her understanding and acceptance.     Dental advisory given  Plan Discussed with: CRNA  Anesthesia Plan Comments:         Anesthesia Quick Evaluation

## 2019-03-17 NOTE — Op Note (Signed)
Shannon [name], Shannon [name]. 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  POSTOPERATIVE DIAGNOSES: 1.  Right knee lateral meniscal tear. 2.  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  PROCEDURE: 1.  Right knee arthroscopy with partial lateral meniscectomy. 2.  Right knee chondroplasty of all 3 compartments.  SURGEON:  [name]. [name], MD  ASSISTANT:  Erskine Emery, PA-C  ANESTHESIA:   1.  General. 2.  Local with 0.25% plain Marcaine into the right knee.  ESTIMATED BLOOD LOSS:  Minimal.  COMPLICATIONS:  None.  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  She has been dealing with right knee pain, swelling with locking and catching for some time now.  She has tried and failed conservative treatment measures.  An MRI  was obtained of her right knee, and it did show a large lateral meniscal tear with a horizontal and vertical component from the midbody to the posterior aspect of the knee.  The radiologist felt there was at least partial thickness cartilage loss  throughout the knee and some loose bodies.  With very conservative treatment and her young age of 89, she hopes for an arthroscopic intervention that could both be diagnostic and therapeutic for her.  Again, she has tried and failed all forms of  conservative treatment.  The risks and benefits of surgery were explained to her in detail and informed consent was obtained.  DESCRIPTION OF PROCEDURE:  After informed consent was obtained and appropriate right knee was marked, she was brought to the operating room  and placed on the operating table.  General anesthesia was then obtained.  A lateral leg post was utilized, and  the right thigh, knee, leg and ankle were prepped and draped with DuraPrep and sterile drapes including a sterile stockinette over the foot and ankle.  The bed was raised.  The right knee was flexed off the side of the table.  A time-out was called, and  she was identified as correct patient, correct right knee.  I then made an anterior lateral arthroscopy portal and inserted a cannula in the knee and drained a moderate effusion from her right knee.  I then placed the camera in the knee and went directly  to the medial compartment and made an anterior medial arthroscopy portal.  Right away, there was a wide area of grade III chondromalacia over the medial femoral condyle.  The medial meniscus was intact.  Using an arthroscopic shaver, we did perform a  chondroplasty where there were areas of fibrillated cartilage that were just coming off of the bone on the medial femoral condyle.  Once we debrided this back to a stable margin, we assessed ACL and PCL and found them to be intact.  We then placed the  knee in a figure-of-four position and went to the lateral compartment.  We found a wide area of full-thickness cartilage loss over the lateral-most aspect of the lateral tibial plateau and the lateral femoral condyle.  There was a significant macerated  and extruded meniscal tear of the lateral meniscus which we debrided back to a stable margin with a partial lateral meniscectomy.  We performed a synovectomy in this compartment as well as  a chondroplasty.  The cartilage loss, though, was quite  extensive.  We then assessed the patellofemoral joint and found 1 area of grade IV chondromalacia in the trochlear groove, but the remainder being grade III chondromalacia of the trochlea groove and the undersurface of the patella.  We performed a  chondroplasty in this area as well.  We then allowed fluid  lavage to the knee.  We looked for any other loose bodies in the knee and then drained all the fluid from the knee and removed all instrumentation.  The portal sites were closed with nylon  suture.  We placed Marcaine into the knee joint and the portal sites as well.  A well-padded sterile dressing was applied.  She was awakened, extubated, and taken to recovery room in stable condition.  All final counts were correct.  There were no  complications noted.  Postoperatively, we will allow her to weight bear as tolerated with slow increase of her activities.  We will see her back in the office in a week.  LN/NUANCE  D:03/17/2019 T:03/17/2019 JOB:[id number]

## 2019-03-18 ENCOUNTER — Encounter (HOSPITAL_BASED_OUTPATIENT_CLINIC_OR_DEPARTMENT_OTHER): Payer: Self-pay | Admitting: Orthopaedic Surgery

## 2019-03-24 ENCOUNTER — Encounter: Payer: Self-pay | Admitting: Physician Assistant

## 2019-03-24 ENCOUNTER — Ambulatory Visit: Payer: 59 | Admitting: Physician Assistant

## 2019-03-24 ENCOUNTER — Other Ambulatory Visit: Payer: Self-pay

## 2019-03-24 DIAGNOSIS — Z9889 Other specified postprocedural states: Secondary | ICD-10-CM

## 2019-03-24 NOTE — Progress Notes (Signed)
HPI: Shannon [name] returns today 7 days status post right knee arthroscopy with partial lateral meniscectomy removal of loose bodies and 3 compartmental chondroplasty.  She had grade 4 changes lateral compartment and grade 3 changes medial and patellofemoral compartment.  She is overall doing well.  Said no chest pain shortness breath fevers or chills.  Physical exam: Right knee full extension flexion to at least 90 degrees calf supple nontender.  Port sites well approximated with 2 interrupted nylon sutures.  No signs of infection.  Impression: Status post right knee arthroscopy  Plan: This point time she will work on range of motion strengthening the knee.  Discussed with her knee friendly exercises.  Sutures removed.  She will work on scar tissue mobilization.  No submerging of the knee until completely healed.  She may benefit from supplemental injections in the future handout was given.  Did review her pictures from the arthroscopy showing the significant arthritis in all 3 compartments.  Questions were encouraged and answered at length.

## 2019-03-31 ENCOUNTER — Telehealth: Payer: Self-pay | Admitting: Orthopaedic Surgery

## 2019-03-31 NOTE — Telephone Encounter (Signed)
Patient called requesting a return call to let her know when she can return to swimming.  Patient also stated that she reviewed the information regarding injections for her knee and would like to order them.

## 2019-03-31 NOTE — Telephone Encounter (Signed)
It is okay for her to swim.  Please order hyaluronic acid for her for her knee or knees.

## 2019-03-31 NOTE — Telephone Encounter (Signed)
Can you please advise?   1. Go ahead and send for gel injection?  2. Can she swim?

## 2019-04-01 ENCOUNTER — Telehealth: Payer: Self-pay

## 2019-04-01 NOTE — Telephone Encounter (Signed)
Can we order gel injection for her right knee please

## 2019-04-01 NOTE — Telephone Encounter (Signed)
Submitted VOB for Monovisc, right knee. 

## 2019-04-01 NOTE — Telephone Encounter (Signed)
Noted  

## 2019-04-07 ENCOUNTER — Telehealth: Payer: Self-pay

## 2019-04-07 NOTE — Telephone Encounter (Signed)
Approved for Monovisc, right knee. Buy & Bill Covered at 100% of the allowable amount. No Co-pay No PA required  Appt. 04/21/2019 with Dr. [name]

## 2019-04-18 ENCOUNTER — Telehealth (HOSPITAL_COMMUNITY): Payer: Self-pay

## 2019-04-18 MED ORDER — ALPRAZOLAM 0.5 MG PO TABS: 0.5000 mg | ORAL_TABLET | Freq: Every day | ORAL | 0 refills | Status: DC | PRN

## 2019-04-18 MED FILL — PREGABALIN 75 MG CAPS: 75 | 30 days supply | Qty: 60 | Fill #1

## 2019-04-18 MED FILL — lamoTRIgine 200 MG TABS: 200 | 30 days supply | Qty: 30 | Fill #1

## 2019-04-18 MED FILL — CITALOPRAM HBR 40 MG TABLET: 40 | 30 days supply | Qty: 30 | Fill #1

## 2019-04-18 MED FILL — VIIBRYD 10 MG TABLET: 10 | 30 days supply | Qty: 30 | Fill #0

## 2019-04-18 MED FILL — ALPRAZOLAM 0.5 MG TABS: 0.5 | 30 days supply | Qty: 30 | Fill #0

## 2019-04-18 NOTE — Telephone Encounter (Signed)
Patient called requesting a refill on Xanax. [facility] [employer]

## 2019-04-18 NOTE — Telephone Encounter (Signed)
sent 

## 2019-04-21 ENCOUNTER — Ambulatory Visit: Payer: 59 | Admitting: Physician Assistant

## 2019-04-21 ENCOUNTER — Ambulatory Visit: Payer: 59 | Admitting: Orthopaedic Surgery

## 2019-04-26 ENCOUNTER — Encounter: Payer: Self-pay | Admitting: Orthopaedic Surgery

## 2019-04-26 ENCOUNTER — Ambulatory Visit (INDEPENDENT_AMBULATORY_CARE_PROVIDER_SITE_OTHER): Payer: 59 | Admitting: Orthopaedic Surgery

## 2019-04-26 ENCOUNTER — Other Ambulatory Visit: Payer: Self-pay

## 2019-04-26 DIAGNOSIS — M1711 Unilateral primary osteoarthritis, right knee: Secondary | ICD-10-CM | POA: Diagnosis not present

## 2019-04-26 MED ORDER — HYALURONAN 88 MG/4ML IX SOSY
88.0000 mg | PREFILLED_SYRINGE | INTRA_ARTICULAR | Status: AC | PRN
  Administered 2019-04-26: 88 mg via INTRA_ARTICULAR

## 2019-04-26 NOTE — Progress Notes (Signed)
   Procedure Note  Patient: Shannon [name]             Date of Birth: 1966/10/01           MRN: [id number]             Visit Date: 04/26/2019   HPI: Shannon [name] returns today for Monovisc injection in her right knee.  Again she had grade 4 changes lateral compartment and grade 3 changes medial and patellofemoral compartments of her right knee.  Physical exam: Right knee full extension flexion past 90 degrees.  Port sites well-healed.  No signs of infection about the knee.  Right knee no effusion abnormal warmth or erythema  Procedures: Visit Diagnoses:  1. Primary osteoarthritis of right knee     Large Joint Inj on 04/26/2019 11:05 AM Indications: pain Details: 22 G 1.5 in needle, superolateral approach  Arthrogram: No  Medications: 88 mg Hyaluronan 88 MG/4ML Outcome: tolerated well, no immediate complications Procedure, treatment alternatives, risks and benefits explained, specific risks discussed. Consent was given by the patient. Immediately prior to procedure a time out was called to verify the correct patient, procedure, equipment, support staff and site/side marked as required. Patient was prepped and draped in the usual sterile fashion.     Plan: She will continue to work on quad strengthening.  She understands that she can have injections of Monovisc in the knee no more often than every 6 months.  She will follow-up on an as-needed basis.  Questions encouraged and answered.

## 2019-04-28 ENCOUNTER — Ambulatory Visit (INDEPENDENT_AMBULATORY_CARE_PROVIDER_SITE_OTHER): Payer: 59

## 2019-04-28 ENCOUNTER — Other Ambulatory Visit: Payer: Self-pay

## 2019-04-28 ENCOUNTER — Ambulatory Visit: Payer: 59 | Admitting: Obstetrics & Gynecology

## 2019-04-28 DIAGNOSIS — N898 Other specified noninflammatory disorders of vagina: Secondary | ICD-10-CM

## 2019-04-28 DIAGNOSIS — B9689 Other specified bacterial agents as the cause of diseases classified elsewhere: Secondary | ICD-10-CM

## 2019-04-28 DIAGNOSIS — N760 Acute vaginitis: Secondary | ICD-10-CM | POA: Diagnosis not present

## 2019-04-28 NOTE — Progress Notes (Signed)
Pt here for self swab. PT c/o vaginal discharge and odor for the past few weeks. Swab done and pt is aware we will call with results and treat if necessary.

## 2019-04-29 LAB — CERVICOVAGINAL ANCILLARY ONLY
Bacterial vaginitis: POSITIVE — AB
Candida vaginitis: NEGATIVE

## 2019-05-02 ENCOUNTER — Other Ambulatory Visit: Payer: Self-pay | Admitting: Obstetrics & Gynecology

## 2019-05-02 ENCOUNTER — Telehealth: Payer: Self-pay | Admitting: *Deleted

## 2019-05-02 MED ORDER — METRONIDAZOLE 500 MG PO TABS: 500.0000 mg | ORAL_TABLET | Freq: Two times a day (BID) | ORAL | 2 refills | Status: DC

## 2019-05-02 MED FILL — METRONIDAZOLE 500 MG TABS: 500 | 7 days supply | Qty: 14 | Fill #0

## 2019-05-02 NOTE — Telephone Encounter (Signed)
Pt notified of positive BV on Aptima swab and Dr Hulan Fray has already sent RX to [facility].

## 2019-05-02 NOTE — Progress Notes (Unsigned)
Flagyl prescribed with refills.

## 2019-05-04 ENCOUNTER — Other Ambulatory Visit: Payer: Self-pay

## 2019-05-04 ENCOUNTER — Encounter (HOSPITAL_COMMUNITY): Payer: Self-pay | Admitting: Psychiatry

## 2019-05-04 ENCOUNTER — Ambulatory Visit (INDEPENDENT_AMBULATORY_CARE_PROVIDER_SITE_OTHER): Payer: 59 | Admitting: Psychiatry

## 2019-05-04 DIAGNOSIS — F0630 Mood disorder due to known physiological condition, unspecified: Secondary | ICD-10-CM

## 2019-05-04 DIAGNOSIS — F410 Panic disorder [episodic paroxysmal anxiety] without agoraphobia: Secondary | ICD-10-CM | POA: Diagnosis not present

## 2019-05-04 DIAGNOSIS — F411 Generalized anxiety disorder: Secondary | ICD-10-CM

## 2019-05-04 DIAGNOSIS — F331 Major depressive disorder, recurrent, moderate: Secondary | ICD-10-CM | POA: Diagnosis not present

## 2019-05-04 NOTE — Progress Notes (Signed)
Patient ID: Shannon [name], female   DOB: 17-Mar-1966, 53 y.o.   MRN: [id number]  [facility] Follow up visit  Shannon [name] [id number] 53 y.o.  05/04/2019 8:39 AM  Chief Complaint:  Depression follow up  History of Present Illness:   I connected with Shannon [name] on 05/04/19 at  8:30 AM EDT by telephone and verified that I am speaking with the correct person using two identifiers.   I discussed the limitations, risks, security and privacy concerns of performing an evaluation and management service by telephone and the availability of in person appointments. I also discussed with the patient that there may be a patient responsible charge related to this service. The patient expressed understanding and agreed to proceed.  Patient lost her dad 2 weeks ago, going thru grief, has mom support. 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 Managing work  viibryd helps depression.   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  Prior Suicide Attempts: No  Medical History; Past Medical History:  Diagnosis Date  . Anxiety   . Arthritis    back and hips   . Depression   . GERD (gastroesophageal reflux disease)   . HPV (human papilloma virus) infection   . Hyperlipidemia   . Lateral meniscus tear    left knee  . PONV (postoperative nausea and vomiting)    slow to wake up   . Pre-diabetes   . Shortness of breath dyspnea    with exertion   . Sleep apnea    cpap -0 setting at 16, uses CPAP nightly  . Stress incontinence     Allergies: Allergies  Allergen Reactions  . Food Swelling    Big Red Gum makes her tongue swell  . Latuda [Lurasidone Hcl] Other (See Comments)    Severe aggitation  . Lipitor [Atorvastatin] Other (See Comments)    Muscle aches  .  Belviq [Lorcaserin Hcl]     Increased appetitie  . Contrave [Naltrexone-Bupropion Hcl Er] Other (See Comments)    Sleepy.    Medications: Outpatient Encounter Medications as of 05/04/2019  Medication Sig  . ALPRAZolam (XANAX) 0.5 MG tablet Take 1 tablet (0.5 mg total) by mouth daily as needed for anxiety.  . baclofen (LIORESAL) 10 MG tablet Take 1 tablet (10 mg total) by mouth 3 (three) times daily as needed for muscle spasms.  . citalopram (CELEXA) 40 MG tablet Take 1 tablet (40 mg total) by mouth daily.  . Ferrous Sulfate (IRON) 325 (65 Fe) MG TABS Take 65 mg by mouth daily.  Marland Kitchen HYDROcodone-acetaminophen (NORCO/VICODIN) 5-325 MG tablet Take 1-2 tablets by mouth every 6 (six) hours as needed for moderate pain.  Marland Kitchen lamoTRIgine (LAMICTAL) 200 MG tablet Take 1 tablet (200 mg total) by mouth daily.  . metroNIDAZOLE (FLAGYL) 500 MG tablet Take 1 tablet (500 mg total) by mouth 2 (two) times daily.  . pregabalin (LYRICA) 75 MG capsule TAKE 1 CAPSULE (75 MG TOTAL) BY MOUTH 2 (TWO) TIMES DAILY.  . Vilazodone HCl (VIIBRYD) 10 MG TABS Take 1 tablet (10 mg total) by mouth daily.   No facility-administered encounter medications on file as of 05/04/2019.      Family History; Family History  Problem Relation Age of Onset  . Hyperlipidemia Mother   .  Sleep apnea Mother   . Depression Mother   . Diabetes Father   . Hypertension Father   . COPD Father   . Diabetes Paternal Aunt   . Diabetes Paternal Uncle   . Alcohol abuse Paternal Uncle   . Alcohol abuse Paternal Grandfather   . Depression Sister       Labs:  Recent Results (from the past 2160 hour(s))  SARS Coronavirus 2 (CEPHEID - Performed in [place] hospital lab), Hosp Order     Status: None   Collection Time: 03/15/19  9:11 AM   Specimen: Nasopharyngeal Swab  Result Value Ref Range   SARS Coronavirus 2 NEGATIVE NEGATIVE    Comment: (NOTE) If result is NEGATIVE SARS-CoV-2 target nucleic acids are NOT DETECTED. The SARS-CoV-2  RNA is generally detectable in upper and lower  respiratory specimens during the acute phase of infection. The lowest  concentration of SARS-CoV-2 viral copies this assay can detect is 250  copies / mL. A negative result does not preclude SARS-CoV-2 infection  and should not be used as the sole basis for treatment or other  patient management decisions.  A negative result may occur with  improper specimen collection / handling, submission of specimen other  than nasopharyngeal swab, presence of viral mutation(s) within the  areas targeted by this assay, and inadequate number of viral copies  (<250 copies / mL). A negative result must be combined with clinical  observations, patient history, and epidemiological information. If result is POSITIVE SARS-CoV-2 target nucleic acids are DETECTED. The SARS-CoV-2 RNA is generally detectable in upper and lower  respiratory specimens dur ing the acute phase of infection.  Positive  results are indicative of active infection with SARS-CoV-2.  Clinical  correlation with patient history and other diagnostic information is  necessary to determine patient infection status.  Positive results do  not rule out bacterial infection or co-infection with other viruses. If result is PRESUMPTIVE POSTIVE SARS-CoV-2 nucleic acids MAY BE PRESENT.   A presumptive positive result was obtained on the submitted specimen  and confirmed on repeat testing.  While 2019 novel coronavirus  (SARS-CoV-2) nucleic acids may be present in the submitted sample  additional confirmatory testing may be necessary for epidemiological  and / or clinical management purposes  to differentiate between  SARS-CoV-2 and other Sarbecovirus currently known to infect humans.  If clinically indicated additional testing with an alternate test  methodology [phone]) is advised. The SARS-CoV-2 RNA is generally  detectable in upper and lower respiratory sp ecimens during the acute  phase of  infection. The expected result is Negative. Fact Sheet for Patients:  StrictlyIdeas.no Fact Sheet for Healthcare Providers: BankingDealers.co.za This test is not yet approved or cleared by the [place] FDA and has been authorized for detection and/or diagnosis of SARS-CoV-2 by FDA under an Emergency Use Authorization (EUA).  This EUA will remain in effect (meaning this test can be used) for the duration of the COVID-19 declaration under Section 564(b)(1) of the Act, 21 U.S.C. section 360bbb-3(b)(1), unless the authorization is terminated or revoked sooner. Performed at [facility], [place] [address], [place], [place] [address]   Cervicovaginal ancillary only( [place])     Status: Abnormal   Collection Time: 04/28/19 12:00 AM  Result Value Ref Range   Bacterial vaginitis **POSITIVE for Gardnerella vaginalis** (A)     Comment: Normal Reference Range - Negative   Candida vaginitis Negative for Candida species     Comment: Normal Reference Range -  Negative        Mental Status Examination;   Psychiatric Specialty Exam: Physical Exam  Constitutional: She appears well-developed and well-nourished. No distress.  Skin: She is not diaphoretic.    Review of Systems  Cardiovascular: Negative for palpitations.  Musculoskeletal: Positive for neck pain.  Skin: Negative for itching.  Psychiatric/Behavioral: Negative for hallucinations.    There were no vitals taken for this visit.There is no height or weight on file to calculate BMI.  General Appearance:  Eye Contact::    Speech:  Normal Rate  Volume:  Normal  Mood somewhat subdued  Affect:  Congruent   Thought Process:  Coherent  Orientation:  Full (Time, Place, and Person)  Thought Content:  Rumination  Suicidal Thoughts:  No  Homicidal Thoughts:  No  Memory:  Immediate;   Fair Recent;   Fair  Judgement:  Fair  Insight:  Shallow  Psychomotor Activity:   Normal  Concentration:  Fair  Recall:  Fair  Akathisia:  Negative  Handed:  Right  AIMS (if indicated):     Assets:  Communication Skills Desire for Improvement Financial Resources/Insurance Housing  Sleep:        Assessment: Axis I: mood disorder unspecified. Rule out mood disorder secondary to general medical condition. Major depressive disorder recurrent moderate. Rule out panic disorder. Generalized anxiety disorder   Axis III:  Past Medical History:  Diagnosis Date  . Anxiety   . Arthritis    back and hips   . Depression   . GERD (gastroesophageal reflux disease)   . HPV (human papilloma virus) infection   . Hyperlipidemia   . Lateral meniscus tear    left knee  . PONV (postoperative nausea and vomiting)    slow to wake up   . Pre-diabetes   . Shortness of breath dyspnea    with exertion   . Sleep apnea    cpap -0 setting at 16, uses CPAP nightly  . Stress incontinence     Axis IV: psychosocial   Treatment Plan and Summary:  Depression: somewhat subdued, also going thru grief, wants to keep meds as such and we talked about to join therapy, says will let us know but has family support. Continue lamictal and vibryd. No rash or side effects. Provided grief supportive therapy  Anxiety:fluctuates. Continue celexa.  Xanax prn. Reviewed sleep hygiene. Wants to keep dose same for now and work on sleep hygiene as anxiety is high at that time  Panic attacks:infrequent. Takes prn xanax. Continue celexa    I discussed the assessment and treatment plan with the patient. The patient was provided an opportunity to ask questions and all were answered. The patient agreed with the plan and demonstrated an understanding of the instructions.   The patient was advised to call back or seek an in-person evaluation if the symptoms worsen or if the condition fails to improve as anticipated. Fu 61m. Or earlier , Merian Capron, MD 05/04/2019

## 2019-05-09 MED FILL — METRONIDAZOLE 500 MG TABS: 500 | 7 days supply | Qty: 14 | Fill #1

## 2019-05-24 ENCOUNTER — Ambulatory Visit (INDEPENDENT_AMBULATORY_CARE_PROVIDER_SITE_OTHER): Payer: 59 | Admitting: Physician Assistant

## 2019-05-24 ENCOUNTER — Encounter: Payer: Self-pay | Admitting: Physician Assistant

## 2019-05-24 VITALS — Temp 98.0°F | Ht 65.5 in | Wt 223.0 lb

## 2019-05-24 DIAGNOSIS — R42 Dizziness and giddiness: Secondary | ICD-10-CM | POA: Insufficient documentation

## 2019-05-24 DIAGNOSIS — R2689 Other abnormalities of gait and mobility: Secondary | ICD-10-CM | POA: Insufficient documentation

## 2019-05-24 MED ORDER — MECLIZINE HCL 25 MG PO TABS: 25.0000 mg | ORAL_TABLET | Freq: Three times a day (TID) | ORAL | 1 refills | Status: DC | PRN

## 2019-05-24 MED FILL — MECLIZINE 25 MG TABLET: 25 | 10 days supply | Qty: 30 | Fill #0

## 2019-05-24 NOTE — Progress Notes (Signed)
Patient ID: Shannon [name], female   DOB: 1965/11/11, 53 y.o.   MRN: [id number] .Marland KitchenVirtual Visit via Video Note  I connected with Shannon [name] on 05/24/19 at 10:10 AM EDT by a video enabled telemedicine application and verified that I am speaking with the correct person using two identifiers.  Location: Patient: home Provider: clinic   I discussed the limitations of evaluation and management by telemedicine and the availability of in person appointments. The patient expressed understanding and agreed to proceed.  History of Present Illness: Pt is a 53 yo female with history of BPPV, right and vertigo who calls in with vertigo symptoms for the last 2 months. Her last time having problems with this was 11/2017. She mostly struggles with dizziness when she gets out of bed or moves suddenly by turning her head. She lately has lost her balanced and that has concerned her. She has not fallen. She continues to drive.  She has had some nausea but no hearing loss or tinnitus. She is having to wear her glasses more like her eyes are getting worse. Denies any ear pain or sinus pressure. epley maneuvers help but not for long. Meclizine help but not as much as it used too. She has never had vestibular rehab. She feels like vertigo is worse when her head turns to the right.   .. Active Ambulatory Problems    Diagnosis Date Noted  . Hyperlipidemia 11/22/2008  . Sleep apnea 02/05/2012  . Mood disorder ([place]) 02/05/2012  . OSA on CPAP 03/31/2014  . Abnormal weight gain 03/31/2014  . Hypothyroidism 08/15/2014  . Diabetes mellitus type II, controlled ([place]) 08/15/2014  . DDD (degenerative disc disease), lumbar 08/28/2014  . Generalized anxiety disorder 03/16/2015  . Hiatal hernia 08/13/2015  . 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 Cervical myofascial pain syndrome 07/31/2016  . Fibroids 10/30/2016  . 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 Iron deficiency anemia secondary to inadequate dietary iron intake 03/25/2018   . Chest pain 05/09/2018  . BPPV (benign paroxysmal positional vertigo), right 05/09/2018  . Polyp of colon 05/21/2018  . Primary osteoarthritis of right knee 10/06/2018  . Acute lateral meniscus tear of right knee 03/17/2019  . Loss of balance 05/24/2019  . Vertigo 05/24/2019   Resolved Ambulatory Problems    Diagnosis Date Noted  . Unspecified otitis media 11/22/2008  . Acute sinusitis, unspecified 11/22/2008  . URI 09/28/2009  . BACK PAIN, LUMBAR 12/26/2008  . Obesity 03/31/2014  . Burning sensation of the foot 08/28/2014  . Numbness of both lower extremities 08/28/2014  . BMI 38.0-38.9,adult 12/29/2014  . Status post gastric surgery 11/12/2015   Past Medical History:  Diagnosis Date  . Anxiety   . Arthritis   . Depression   . GERD (gastroesophageal reflux disease)   . HPV (human papilloma virus) infection   . Lateral meniscus tear   . PONV (postoperative nausea and vomiting)   . Pre-diabetes   . Shortness of breath dyspnea   . Stress incontinence    Reviewed med, allergy, problem list.  Observations/Objective: No acute distress.  Normal breathing.  Normal mood.   .. Today's Vitals   05/24/19 0946  Temp: 98 F (36.7 C)  TempSrc: Oral  Weight: 223 lb (101.2 kg)  Height: 5' 5.5" (1.664 m)   Body mass index is 36.54 kg/m.   Assessment and Plan: Marland KitchenMarland KitchenAmy was seen today for dizziness.  Diagnoses and all orders for this visit:  Vertigo -     meclizine (ANTIVERT)  25 MG tablet; Take 1 tablet (25 mg total) by mouth 3 (three) times daily as needed for dizziness. -     Ambulatory referral to Physical Therapy  Loss of balance   Ordered vestibular rehab. Refilled antivert. If not improving or balance worsening will get MRI of head.  Follow up in 2 weeks.    Follow Up Instructions:    I discussed the assessment and treatment plan with the patient. The patient was provided an opportunity to ask questions and all were answered. The patient agreed with the plan  and demonstrated an understanding of the instructions.   The patient was advised to call back or seek an in-person evaluation if the symptoms worsen or if the condition fails to improve as anticipated.   Iran Planas, PA-C

## 2019-05-24 NOTE — Progress Notes (Signed)
Dizziness - 2 month history off and on, worse the last two weeks.  No new medicines, ran out of meclizine. Meclizine not helping as much now. Has never done vestibular rehab. Does have exercises which help some.

## 2019-05-25 ENCOUNTER — Other Ambulatory Visit (HOSPITAL_COMMUNITY): Payer: Self-pay | Admitting: Psychiatry

## 2019-05-25 ENCOUNTER — Telehealth (HOSPITAL_COMMUNITY): Payer: Self-pay

## 2019-05-25 MED FILL — lamoTRIgine 200 MG TABS: 200 | 30 days supply | Qty: 30 | Fill #2

## 2019-05-25 MED FILL — CITALOPRAM HBR 40 MG TABLET: 40 | 30 days supply | Qty: 30 | Fill #2

## 2019-05-25 MED FILL — PREGABALIN 75 MG CAPS: 75 | 30 days supply | Qty: 60 | Fill #2

## 2019-05-25 MED FILL — VIIBRYD 10 MG TABLET: 10 | 30 days supply | Qty: 30 | Fill #1

## 2019-05-25 MED FILL — ALPRAZOLAM 0.5 MG TABS: 0.5 | 30 days supply | Qty: 30 | Fill #0

## 2019-05-25 NOTE — Telephone Encounter (Signed)
Informed patient

## 2019-05-25 NOTE — Telephone Encounter (Signed)
This is a Dr. De Nurse patient that called and requested a refill on Xanax. She uses [facility]. Thanks

## 2019-05-25 NOTE — Telephone Encounter (Signed)
Rx sent 

## 2019-05-31 ENCOUNTER — Ambulatory Visit (INDEPENDENT_AMBULATORY_CARE_PROVIDER_SITE_OTHER): Payer: 59 | Admitting: Physical Therapy

## 2019-05-31 ENCOUNTER — Other Ambulatory Visit: Payer: Self-pay

## 2019-05-31 ENCOUNTER — Encounter: Payer: Self-pay | Admitting: Physical Therapy

## 2019-05-31 DIAGNOSIS — R2681 Unsteadiness on feet: Secondary | ICD-10-CM | POA: Diagnosis not present

## 2019-05-31 DIAGNOSIS — H8111 Benign paroxysmal vertigo, right ear: Secondary | ICD-10-CM | POA: Diagnosis not present

## 2019-05-31 DIAGNOSIS — R42 Dizziness and giddiness: Secondary | ICD-10-CM

## 2019-05-31 NOTE — Therapy (Signed)
[facility] [place], [place], [address] Phone: [phone]   Fax:  [phone]  Physical Therapy Evaluation  Patient Details  Name: Shannon [name] MRN: [id number] Date of Birth: 03-22-1966 Referring Provider (PT): [name], [place]   Encounter Date: 05/31/2019  PT End of Session - 05/31/19 1143    Visit Number  1    Number of Visits  6    Date for PT Re-Evaluation  07/12/19    PT Start Time  1100    PT Stop Time  1141    PT Time Calculation (min)  41 min    Activity Tolerance  Patient tolerated treatment well    Behavior During Therapy  [facility] for tasks assessed/performed       Past Medical History:  Diagnosis Date  . Anxiety   . Arthritis    back and hips   . Depression   . GERD (gastroesophageal reflux disease)   . HPV (human papilloma virus) infection   . Hyperlipidemia   . Lateral meniscus tear    left knee  . PONV (postoperative nausea and vomiting)    slow to wake up   . Pre-diabetes   . Shortness of breath dyspnea    with exertion   . Sleep apnea    cpap -0 setting at 16, uses CPAP nightly  . Stress incontinence     Past Surgical History:  Procedure Laterality Date  . APPENDECTOMY    . BREATH TEK H PYLORI N/A 04/03/2015   Procedure: BREATH TEK H PYLORI;  Surgeon: Greer Pickerel, MD;  Location: [facility];  Service: General;  Laterality: N/A;  . CESAREAN SECTION    . 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   second interspace, left foot  . feet surgery    . KNEE ARTHROSCOPY Right 03/17/2019   Procedure: RIGHT KNEE ARTHROSCOPY WITH PARTIAL LATERAL MENISCECTOMY, REMOVAL OF LOOSE BODIES, 3 COMPARTMENT CHONDROPLASTY;  Surgeon: Mcarthur Rossetti, MD;  Location: [facility];  Service: Orthopedics;  Laterality: Right;  . LAPAROSCOPIC GASTRIC SLEEVE RESECTION WITH HIATAL HERNIA REPAIR N/A 08/13/2015    Procedure: LAPAROSCOPIC GASTRIC SLEEVE RESECTION WITH HIATAL HERNIA REPAIR;  Surgeon: Greer Pickerel, MD;  Location: [facility];  Service: General;  Laterality: N/A;  . laproscopy    . UPPER GI ENDOSCOPY  08/13/2015   Procedure: UPPER GI ENDOSCOPY;  Surgeon: Greer Pickerel, MD;  Location: [facility];  Service: General;;  . WISDOM TOOTH EXTRACTION      There were no vitals filed for this visit.   Subjective Assessment - 05/31/19 1103    Subjective  Pt is a 53 y/o female who presents to OPPT for vertigo x 2 months.  Pt reports she is doing a little better since it started (taking meclizine).  Pt reports stress has contributed to dizziness (father recently passed away) and due to [facility].    Patient Stated Goals  improve the dizziness    Currently in Pain?  Yes    Pain Score  4     Pain Location  Head    Pain Orientation  Right;Left    Pain Descriptors / Indicators  Aching;Constant    Pain Type  Chronic pain    Effect of Pain on Daily Activities  will monitor but will not directly address         [facility] PT Assessment - 05/31/19 1106  Assessment   Medical Diagnosis  R42 (ICD-10-CM) - Vertigo    Referring Provider (PT)  [name], PA-C    Onset Date/Surgical Date  --   2 months ago   Hand Dominance  Right    Next MD Visit  after 2 PT sessions    Prior Therapy  at this clinic for neck pain      Precautions   Precautions  None      Restrictions   Weight Bearing Restrictions  No      Balance Screen   Has the patient fallen in the past 6 months  No    Has the patient had a decrease in activity level because of a fear of falling?   Yes    Is the patient reluctant to leave their home because of a fear of falling?   No      Prior Function   Level of Independence  Independent    Vocation  Full time employment    Vocation Requirements  respiratory therapist at [employer]   Overall Cognitive Status  Within Functional Limits for tasks assessed            Vestibular Assessment - 05/31/19 1109      Vestibular Assessment   General Observation  mild symptoms at rest; symptoms rated 2/10      Symptom Behavior   Subjective history of current problem  see subjective    Type of Dizziness   Lightheadedness   "cloudiness"   Frequency of Dizziness  intermittent; couple times a day    Duration of Dizziness  variable ("maybe a couple hours")    Symptom Nature  Variable    Aggravating Factors  Forward bending;Rolling to right    Relieving Factors  Rest    Progression of Symptoms  Better      Oculomotor Exam   Oculomotor Alignment  Normal    Spontaneous  Absent    Gaze-induced   Absent    Smooth Pursuits  Intact    Saccades  Intact      Oculomotor Exam-Fixation Suppressed    Left Head Impulse  WNL - guarding    Right Head Impulse  WNL      Vestibulo-Ocular Reflex   VOR 1 Head Only (x 1 viewing)  WNL with increase in symptoms      Positional Testing   Dix-Hallpike  Dix-Hallpike Right;Dix-Hallpike Left    Sidelying Test  Sidelying Right;Sidelying Left    Horizontal Canal Testing  Horizontal Canal Right;Horizontal Canal Left      Dix-Hallpike Right   Dix-Hallpike Right Duration  1-2 sec    Dix-Hallpike Right Symptoms  No nystagmus      Dix-Hallpike Left   Dix-Hallpike Left Duration  none    Dix-Hallpike Left Symptoms  No nystagmus      Sidelying Right   Sidelying Right Duration  none    Sidelying Right Symptoms  No nystagmus      Sidelying Left   Sidelying Left Duration  none    Sidelying Left Symptoms  No nystagmus      Horizontal Canal Right   Horizontal Canal Right Duration  1-2 sec    Horizontal Canal Right Symptoms  Normal      Horizontal Canal Left   Horizontal Canal Left Duration  none    Horizontal Canal Left Symptoms  Normal          Objective measurements completed on examination: See above  findings.       Vestibular Treatment/Exercise - 05/31/19 1130      Vestibular Treatment/Exercise    Vestibular Treatment Provided  Canalith Repositioning    Canalith Repositioning  Canal Roll Right    Gaze Exercises  X1 Viewing Horizontal;X1 Viewing Vertical      Canal Roll Right   Number of Reps   1      X1 Viewing Horizontal   Foot Position  seated    Time  --   30 sec   Comments  instructed for home; did not perform      X1 Viewing Vertical   Foot Position  seated    Time  --   30 sec   Comments  instructed for home; did not perform            PT Education - 05/31/19 1142    Education Details  BPPV; clinical findings; POC    Person(s) Educated  Patient    Methods  Explanation;Demonstration;Handout    Comprehension  Verbalized understanding;Returned demonstration       PT Short Term Goals - 05/31/19 1235      PT SHORT TERM GOAL #1   Title  balance testing to be performed with goals to be written    Status  New    Target Date  06/14/19        PT Long Term Goals - 05/31/19 1235      PT LONG TERM GOAL #1   Title  independent with HEP    Status  New    Target Date  07/12/19      PT LONG TERM GOAL #2   Title  demonstrate negative positional testing    Status  New    Target Date  07/12/19      PT LONG TERM GOAL #3   Title  n/a      PT LONG TERM GOAL #4   Title  n/a      PT LONG TERM GOAL #5   Title  n/a             Plan - 05/31/19 1135    Clinical Impression Statement  Pt is a 53 y/o female who presents to OPPT for vertigo.  Assessment limited as pt currently taking meclizine which is a vestibular supressant.  Pt's subjective and some clinical findings suggestive of Rt horizontal BPPV so treated today with canalith repositioning.  Feel symptoms are likely multifactorial as she indicates stress exacerbates symptoms, which isn't consistent with BPPV.  Will continue to treat and address deficits as noted to maximize function.    Personal Factors and Comorbidities  Social Background;Behavior Pattern    Examination-Activity Limitations  Sleep;Bed  Mobility    Examination-Participation Restrictions  Other   work   Stability/Clinical Decision Making  Evolving/Moderate complexity    Clinical Decision Making  Moderate    Rehab Potential  Good    PT Frequency  1x / week    PT Duration  6 weeks    PT Treatment/Interventions  ADLs/Self Care Home Management;Canalith Repostioning;Cryotherapy;Balance training;Therapeutic exercise;Therapeutic activities;Stair training;Gait training;Functional mobility training;Neuromuscular re-education;Patient/family education;Vestibular    PT Next Visit Plan  reassess canals PRN, balance assessment    PT Home Exercise Plan  Access Code: [email or web address]       Patient will benefit from skilled therapeutic intervention in order to improve the following deficits and impairments:  Decreased balance, Dizziness  Visit Diagnosis: 1. BPPV (benign paroxysmal positional vertigo), right   2. Dizziness and  giddiness   3. Unsteadiness on feet        Problem List Patient Active Problem List   Diagnosis Date Noted  . Loss of balance 05/24/2019  . Vertigo 05/24/2019  . Acute lateral meniscus tear of right knee 03/17/2019  . Primary osteoarthritis of right knee 10/06/2018  . Polyp of colon 05/21/2018  . Chest pain 05/09/2018  . BPPV (benign paroxysmal positional vertigo), right 05/09/2018  . Iron deficiency anemia secondary to inadequate dietary iron intake 03/25/2018  . 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 Fibroids 10/30/2016  . Cervical myofascial pain syndrome 07/31/2016  . Hiatal hernia 08/13/2015  . 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 Generalized anxiety disorder 03/16/2015  . DDD (degenerative disc disease), lumbar 08/28/2014  . Hypothyroidism 08/15/2014  . Diabetes mellitus type II, controlled ([place]) 08/15/2014  . OSA on CPAP 03/31/2014  . Abnormal weight gain 03/31/2014  . Sleep apnea 02/05/2012  . Mood disorder ([place]) 02/05/2012  . Hyperlipidemia 11/22/2008      Laureen Abrahams, PT,  DPT 05/31/19 12:40 PM       [facility] [place], [place], [address] Phone: [phone]   Fax:  [phone]  Name: Shannon [name] MRN: [id number] Date of Birth: [birth year]

## 2019-05-31 NOTE — Patient Instructions (Signed)
Access Code: [email or web address]  URL: [email or web address]  Date: 05/31/2019  Prepared by: Faustino Congress   Exercises  Right Side Lying to Left Side Lying Vestibular Habituation - 3-5 reps - 1 sets - 2x daily - 7x weekly  Seated Gaze Stabilization with Head Nod - 1 sets - 30 Seconds - 2x daily - 7x weekly  Seated Gaze Stabilization with Head Rotation - 1 sets - 30 seconds - 2x daily - 7x weekly

## 2019-06-02 ENCOUNTER — Telehealth: Payer: Self-pay | Admitting: *Deleted

## 2019-06-02 DIAGNOSIS — M7918 Myalgia, other site: Secondary | ICD-10-CM

## 2019-06-02 MED ORDER — PREGABALIN 100 MG PO CAPS: 100.0000 mg | ORAL_CAPSULE | Freq: Two times a day (BID) | ORAL | 3 refills | Status: DC

## 2019-06-02 MED FILL — PREGABALIN 100 MG CAPS: 100 | 30 days supply | Qty: 60 | Fill #0

## 2019-06-02 NOTE — Telephone Encounter (Signed)
Pt called today and left vm stating that she is having a myofacial pain flare up.  She wanted to know if she needed to make a f/u appointment with you or if you could just increase her Lyrica. Please advise.

## 2019-06-02 NOTE — Telephone Encounter (Signed)
Pt notified of new rx and will schedule a virtual in the next 3-4 weeks.

## 2019-06-02 NOTE — Telephone Encounter (Signed)
I went ahead and increase the dose, have her do a virtual visit with me at some point to see how things are going.

## 2019-06-07 ENCOUNTER — Encounter: Payer: 59 | Admitting: Physical Therapy

## 2019-06-14 ENCOUNTER — Encounter: Payer: 59 | Admitting: Physical Therapy

## 2019-06-21 ENCOUNTER — Encounter: Payer: 59 | Admitting: Physical Therapy

## 2019-07-08 ENCOUNTER — Other Ambulatory Visit (HOSPITAL_COMMUNITY): Payer: Self-pay

## 2019-07-08 DIAGNOSIS — F331 Major depressive disorder, recurrent, moderate: Secondary | ICD-10-CM

## 2019-07-08 MED ORDER — VILAZODONE HCL 10 MG PO TABS: 10.0000 mg | ORAL_TABLET | Freq: Every day | ORAL | 0 refills | Status: DC

## 2019-07-08 MED ORDER — LAMOTRIGINE 200 MG PO TABS: 200.0000 mg | ORAL_TABLET | Freq: Every day | ORAL | 0 refills | Status: DC

## 2019-07-08 MED ORDER — CITALOPRAM HYDROBROMIDE 40 MG PO TABS: 40.0000 mg | ORAL_TABLET | Freq: Every day | ORAL | 0 refills | Status: DC

## 2019-07-08 MED ORDER — ALPRAZOLAM 0.5 MG PO TABS: 0.5000 mg | ORAL_TABLET | Freq: Every day | ORAL | 0 refills | Status: DC | PRN

## 2019-07-08 MED FILL — lamoTRIgine 200 MG TABS: 200 | 30 days supply | Qty: 30 | Fill #0

## 2019-07-08 MED FILL — CITALOPRAM HBR 40 MG TABLET: 40 | 30 days supply | Qty: 30 | Fill #0

## 2019-07-08 MED FILL — ALPRAZolam 0.5 MG TABS: 0.5 | 30 days supply | Qty: 30 | Fill #0

## 2019-07-08 MED FILL — VIIBRYD 10 MG TABLET: 10 | 30 days supply | Qty: 30 | Fill #0

## 2019-07-13 ENCOUNTER — Encounter (HOSPITAL_COMMUNITY): Payer: Self-pay | Admitting: Psychiatry

## 2019-07-13 ENCOUNTER — Ambulatory Visit (INDEPENDENT_AMBULATORY_CARE_PROVIDER_SITE_OTHER): Payer: 59 | Admitting: Psychiatry

## 2019-07-13 DIAGNOSIS — F0630 Mood disorder due to known physiological condition, unspecified: Secondary | ICD-10-CM | POA: Diagnosis not present

## 2019-07-13 DIAGNOSIS — F411 Generalized anxiety disorder: Secondary | ICD-10-CM

## 2019-07-13 DIAGNOSIS — F331 Major depressive disorder, recurrent, moderate: Secondary | ICD-10-CM

## 2019-07-13 DIAGNOSIS — F410 Panic disorder [episodic paroxysmal anxiety] without agoraphobia: Secondary | ICD-10-CM | POA: Diagnosis not present

## 2019-07-13 NOTE — Progress Notes (Signed)
Patient ID: Shannon [name], female   DOB: 03-04-1966, 53 y.o.   MRN: [id number]  [facility] Follow up visit  ROSELIND DESANTOS [id number] 53 y.o.  07/13/2019 1:08 PM  Chief Complaint:  Depression follow up  History of Present Illness:     I connected with Raylie D Guinn on 07/13/19 at  1:00 PM EDT by telephone and verified that I am speaking with the correct person using two identifiers. I discussed the limitations, risks, security and privacy concerns of performing an evaluation and management service by telephone and the availability of in person appointments. I also discussed with the patient that there may be a patient responsible charge related to this service. The patient expressed understanding and agreed to proceed.  Has had panic attack a week ago at work. 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 Managing work  No side effects.   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  Prior Suicide Attempts: No  Medical History; Past Medical History:  Diagnosis Date  . Anxiety   . Arthritis    back and hips   . Depression   . GERD (gastroesophageal reflux disease)   . HPV (human papilloma virus) infection   . Hyperlipidemia   . Lateral meniscus tear    left knee  . PONV (postoperative nausea and vomiting)    slow to wake up   . Pre-diabetes   . Shortness of breath dyspnea    with exertion   . Sleep apnea    cpap -0 setting at 16, uses CPAP nightly  . Stress incontinence     Allergies: Allergies  Allergen Reactions  . Food Swelling    Big Red Gum makes her tongue swell  . Latuda [Lurasidone Hcl] Other (See Comments)    Severe aggitation  . Lipitor [Atorvastatin] Other (See Comments)    Muscle aches  . Belviq [Lorcaserin Hcl]     Increased appetitie  . Contrave [Naltrexone-Bupropion Hcl Er] Other (See Comments)    Sleepy.    Medications: Outpatient Encounter Medications as of 07/13/2019  Medication Sig  . ALPRAZolam (XANAX) 0.5 MG tablet Take 1 tablet (0.5 mg total) by mouth daily as needed for anxiety.  . citalopram (CELEXA) 40 MG tablet Take 1 tablet (40 mg total) by mouth daily.  . Ferrous Sulfate (IRON) 325 (65 Fe) MG TABS Take 65 mg by mouth daily.  Marland Kitchen HYDROcodone-acetaminophen (NORCO/VICODIN) 5-325 MG tablet Take 1-2 tablets by mouth every 6 (six) hours as needed for moderate pain. (Patient not taking: Reported on 05/31/2019)  . lamoTRIgine (LAMICTAL) 200 MG tablet Take 1 tablet (200 mg total) by mouth daily.  . meclizine (ANTIVERT) 25 MG tablet Take 1 tablet (25 mg total) by mouth 3 (three) times daily as needed for dizziness.  . metroNIDAZOLE (FLAGYL) 500 MG tablet Take 1 tablet (500 mg total) by mouth 2 (two) times daily. (Patient not taking: Reported on 05/31/2019)  . pregabalin (LYRICA) 100 MG capsule Take 1 capsule (100 mg total) by mouth 2 (two) times daily.  . Vilazodone HCl (VIIBRYD) 10 MG TABS Take 1 tablet (10 mg total) by mouth daily.   No facility-administered encounter medications on file as of 07/13/2019.      Family  History; Family History  Problem Relation Age of Onset  . Hyperlipidemia Mother   . Sleep apnea Mother   . Depression Mother   . Diabetes Father   . Hypertension Father   . COPD Father   . Diabetes Paternal Aunt   . Diabetes Paternal Uncle   . Alcohol abuse Paternal Uncle   . Alcohol abuse Paternal Grandfather   . Depression Sister       Labs:  Recent Results (from the past 2160 hour(s))  Cervicovaginal ancillary only( [place])     Status: Abnormal   Collection Time: 04/28/19 12:00 AM  Result Value Ref Range   Bacterial vaginitis **POSITIVE for Gardnerella vaginalis** (A)     Comment: Normal Reference Range -  Negative   Candida vaginitis Negative for Candida species     Comment: Normal Reference Range - Negative        Mental Status Examination;   Psychiatric Specialty Exam: Physical Exam  Review of Systems  Cardiovascular: Negative for palpitations.  Skin: Negative for itching.  Psychiatric/Behavioral: Negative for hallucinations.    There were no vitals taken for this visit.There is no height or weight on file to calculate BMI.  General Appearance:  Eye Contact::    Speech:  Normal Rate  Volume:  Normal  Mood somewhat subdued  Affect:  Congruent   Thought Process:  Coherent  Orientation:  Full (Time, Place, and Person)  Thought Content:  Rumination  Suicidal Thoughts:  No  Homicidal Thoughts:  No  Memory:  Immediate;   Fair Recent;   Fair  Judgement:  Fair  Insight:  Shallow  Psychomotor Activity:  Normal  Concentration:  Fair  Recall:  Fair  Akathisia:  Negative  Handed:  Right  AIMS (if indicated):     Assets:  Communication Skills Desire for Improvement Financial Resources/Insurance Housing  Sleep:        Assessment: Axis I: mood disorder unspecified. Rule out mood disorder secondary to general medical condition. Major depressive disorder recurrent moderate. Rule out panic disorder. Generalized anxiety disorder   Axis III:  Past Medical History:  Diagnosis Date  . Anxiety   . Arthritis    back and hips   . Depression   . GERD (gastroesophageal reflux disease)   . HPV (human papilloma virus) infection   . Hyperlipidemia   . Lateral meniscus tear    left knee  . PONV (postoperative nausea and vomiting)    slow to wake up   . Pre-diabetes   . Shortness of breath dyspnea    with exertion   . Sleep apnea    cpap -0 setting at 16, uses CPAP nightly  . Stress incontinence     Axis IV: psychosocial   Treatment Plan and Summary:  Depression: somewhat subdued, feels meds help but need to add therapy Continue lamictal. Vibryd, celexa No rash or  side effects. Provided grief supportive therapy  Anxiety:fluctuates.with panic at times. Continue low dose xanax, celexa. Will refer for therapy , discussed partial program but she works Panic attacks see above. Takes prn xanax. Continue celexa    I discussed the assessment and treatment plan with the patient. The patient was provided an opportunity to ask questions and all were answered. The patient agreed with the plan and demonstrated an understanding of the instructions.   The patient was advised to call back or seek an in-person evaluation if the symptoms worsen or if the condition fails to improve as anticipated. Fu 31m. Or earlier if  not gets any better , Merian Capron, MD 07/13/2019

## 2019-07-14 MED FILL — PREGABALIN 100 MG CAPS: 100 | 30 days supply | Qty: 60 | Fill #1

## 2019-08-04 ENCOUNTER — Ambulatory Visit (HOSPITAL_COMMUNITY): Payer: 59 | Admitting: Psychiatry

## 2019-08-04 ENCOUNTER — Other Ambulatory Visit (HOSPITAL_COMMUNITY): Payer: Self-pay | Admitting: Psychiatry

## 2019-08-04 DIAGNOSIS — F331 Major depressive disorder, recurrent, moderate: Secondary | ICD-10-CM

## 2019-08-04 MED FILL — VIIBRYD 10 MG TABLET: 10 | 30 days supply | Qty: 30 | Fill #2

## 2019-08-09 ENCOUNTER — Other Ambulatory Visit (HOSPITAL_COMMUNITY): Payer: Self-pay

## 2019-08-09 ENCOUNTER — Ambulatory Visit (INDEPENDENT_AMBULATORY_CARE_PROVIDER_SITE_OTHER): Payer: 59 | Admitting: Licensed Clinical Social Worker

## 2019-08-09 DIAGNOSIS — F331 Major depressive disorder, recurrent, moderate: Secondary | ICD-10-CM | POA: Diagnosis not present

## 2019-08-09 DIAGNOSIS — F0630 Mood disorder due to known physiological condition, unspecified: Secondary | ICD-10-CM

## 2019-08-09 DIAGNOSIS — F411 Generalized anxiety disorder: Secondary | ICD-10-CM | POA: Diagnosis not present

## 2019-08-09 MED ORDER — LAMOTRIGINE 200 MG PO TABS: 200.0000 mg | ORAL_TABLET | Freq: Every day | ORAL | 0 refills | Status: DC

## 2019-08-09 MED ORDER — ALPRAZOLAM 0.5 MG PO TABS: 0.5000 mg | ORAL_TABLET | Freq: Every day | ORAL | 0 refills | Status: DC | PRN

## 2019-08-09 MED ORDER — CITALOPRAM HYDROBROMIDE 40 MG PO TABS: 40.0000 mg | ORAL_TABLET | Freq: Every day | ORAL | 0 refills | Status: DC

## 2019-08-09 MED FILL — ALPRAZolam 0.5 MG TABS: 0.5 | 30 days supply | Qty: 30 | Fill #0

## 2019-08-09 MED FILL — lamoTRIgine 200 MG TABS: 200 | 30 days supply | Qty: 30 | Fill #0

## 2019-08-09 MED FILL — CITALOPRAM HBR 40 MG TABLET: 40 | 30 days supply | Qty: 30 | Fill #0

## 2019-08-09 MED FILL — PREGABALIN 100 MG CAPS: 100 | 30 days supply | Qty: 60 | Fill #1
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
## 2019-08-15 ENCOUNTER — Ambulatory Visit (HOSPITAL_COMMUNITY): Payer: 59 | Admitting: Psychiatry

## 2019-08-17 DIAGNOSIS — Z20828 Contact with and (suspected) exposure to other viral communicable diseases: Secondary | ICD-10-CM | POA: Diagnosis not present

## 2019-08-30 ENCOUNTER — Ambulatory Visit (INDEPENDENT_AMBULATORY_CARE_PROVIDER_SITE_OTHER): Payer: 59 | Admitting: Licensed Clinical Social Worker

## 2019-08-30 ENCOUNTER — Other Ambulatory Visit: Payer: Self-pay

## 2019-08-30 DIAGNOSIS — F0630 Mood disorder due to known physiological condition, unspecified: Secondary | ICD-10-CM | POA: Diagnosis not present

## 2019-08-30 DIAGNOSIS — F411 Generalized anxiety disorder: Secondary | ICD-10-CM

## 2019-08-30 DIAGNOSIS — F331 Major depressive disorder, recurrent, moderate: Secondary | ICD-10-CM | POA: Diagnosis not present

## 2019-08-30 NOTE — Progress Notes (Signed)
Virtual Visit via Video Note  I connected with Shannon [name] on 08/30/19 at 10:00 AM EST by a video enabled telemedicine application and verified that I am speaking with the correct person using two identifiers.   I discussed the limitations of evaluation and management by telemedicine and the availability of in person appointments. The patient expressed understanding and agreed to proceed.  I discussed the assessment and treatment plan with the patient. The patient was provided an opportunity to ask questions and all were answered. The patient agreed with the plan and demonstrated an understanding of the instructions.   The patient was advised to call back or seek an in-person evaluation if the symptoms worsen or if the condition fails to improve as anticipated.  I provided 55 minutes of non-face-to-face time during this encounter.   THERAPIST PROGRESS NOTE  Session Time: 10:02 AM to 10:57 AM  Participation Level: Active  Behavioral Response: CasualAlertDysphoric  Type of Therapy: Individual Therapy  Treatment Goals addressed:  elevate mood and show evidence of usual energy, activities and socialization level   Interventions: CBT, Solution Focused, Strength-based, Supportive and Other: self-esteem, coping  Summary: Shannon [name] is a 53 y.o. female who presents with good week.  Could not identify a source but better mood.  There is 1 piece of good news as she is going to make sure that she is son and mom have vacation.  Therapist reviewed vacations is a very effective strategy to help with mood. 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  Therapist reviewed schedule to find out time where she is able to have fun.  Reviewed activities where she finds enjoyment help to improve mood, help her to recharge battery.  Reviewed "Ace activities" are activities that help with mood that include doing activities where she she has a feeling of  achievement and purpose, activities that she enjoys and activities where she feels connection with others are helpful to include daily.  Reviewed that work is her sense of purpose, explored activities she enjoys.  Patient relates feels tired out most of the time says medication contributes to that.  Takes Lyrica for neck and shoulder pain, doctor has increased dosage does cause her to be more drowsy.  She also does exercises to help with pain, pain is a lot better with medication increase.  Does relax with activities such as watching television but reviewing session related she knows she needs to it out a little more, start walking.  Therapist reviewed plan for patient to start walking reviewed this is helping with depression, helping with physical health as well, helping her to feel good about herself and working on taking care of herself and working on goals.  Reviewed connecting with others and patient said with coronavirus has not connected much with friends.  Therapist explored creative options for patient connected online through clubs.  Therapist work with patient on CBT model through worksheet and video explaining how thoughts impact how we feel and then what we do and that we can intervene in all 3 different modalities to help with mood.  Explained that when one is depressed 1 looks at things through gloomy specs, we can work on depressions to identify cognitive distortions and challenging them.  Also we can work on depressive symptoms by changing her physiological status we experience emotions through her body.  Explained biological component of depression what makes sense that what we experience to bodies can impact  how we feel and think.  Also reviewed behavior strategies which is what often start with in managing depression can also improve mood. This explored with patient depressive thoughts to include feeling worthless and useless, feeling as things are her fault even though she knows its life,  feelings of hopelessness.  Patient shares history that things have been cloudy since a teenager or little older, part of this mood includes feeling detached.  Does not know whether or not it is because she is not in a good mood not sure why this is the case.  Does identify significant biological component to depression.  Therapist introduced concept of self-esteem that we will have unconditional worth and in patient's case it is coming to appreciate her value.  Also there is a component of grief that impacts depression that needs to be worked on.  Therapist provided strength based on supportive intervention  Suicidal/Homicidal: No  Plan: Return again in 2 weeks.2.  Therapist work with patient on self-esteem, automatic thoughts, cognitive distortions, self-esteem, grief. 3.  Patient will make steps to start walking and also get out a little more.  Diagnosis: Axis I:  mood disorder unspecified, major depressive disorder, recurrent, moderate, generalized anxiety disorder    Axis II: No diagnosis    Cordella Register, LCSW 08/30/2019

## 2019-09-14 ENCOUNTER — Ambulatory Visit (HOSPITAL_COMMUNITY): Payer: 59 | Admitting: Licensed Clinical Social Worker

## 2019-09-14 ENCOUNTER — Other Ambulatory Visit (HOSPITAL_COMMUNITY): Payer: Self-pay | Admitting: Psychiatry

## 2019-09-14 DIAGNOSIS — F331 Major depressive disorder, recurrent, moderate: Secondary | ICD-10-CM

## 2019-09-14 MED FILL — CITALOPRAM HBR 40 MG TABLET: 40 | 30 days supply | Qty: 30 | Fill #0

## 2019-09-14 MED FILL — lamoTRIgine 200 MG TABS: 200 | 30 days supply | Qty: 30 | Fill #0

## 2019-09-14 MED FILL — VIIBRYD 10 MG TABLET: 10 | 30 days supply | Qty: 30 | Fill #0

## 2019-09-14 MED FILL — PREGABALIN 100 MG CAPS: 100 | 30 days supply | Qty: 60 | Fill #2

## 2019-09-16 MED FILL — METRONIDAZOLE 500 MG TABS: 500 | 7 days supply | Qty: 14 | Fill #2

## 2019-09-29 ENCOUNTER — Telehealth (HOSPITAL_COMMUNITY): Payer: Self-pay | Admitting: Psychiatry

## 2019-09-29 MED ORDER — ALPRAZOLAM 0.5 MG PO TABS: 0.5000 mg | ORAL_TABLET | Freq: Every day | ORAL | 0 refills | Status: DC | PRN

## 2019-09-29 NOTE — Telephone Encounter (Signed)
Pt calling to get refill on xanax, [facility]

## 2019-10-04 MED ORDER — ALPRAZOLAM 0.5 MG PO TABS: 0.5000 mg | ORAL_TABLET | Freq: Every day | ORAL | 0 refills | Status: DC | PRN

## 2019-10-04 MED FILL — ALPRAZolam 0.5 MG TABS: 0.5 | 30 days supply | Qty: 30 | Fill #0

## 2019-10-04 NOTE — Addendum Note (Signed)
Addended by: Merian Capron on: 10/04/2019 03:42 PM   Modules accepted: Orders

## 2019-10-04 NOTE — Telephone Encounter (Signed)
Ok sent!

## 2019-10-04 NOTE — Telephone Encounter (Signed)
Can you please resend the xanax   The last one you sent it did not go to the pharmacy, it says "Print"

## 2019-10-17 ENCOUNTER — Other Ambulatory Visit (HOSPITAL_COMMUNITY): Payer: Self-pay

## 2019-10-17 DIAGNOSIS — F331 Major depressive disorder, recurrent, moderate: Secondary | ICD-10-CM

## 2019-10-17 MED ORDER — CITALOPRAM HYDROBROMIDE 40 MG PO TABS: 40.0000 mg | ORAL_TABLET | Freq: Every day | ORAL | 0 refills | Status: DC

## 2019-10-17 MED ORDER — LAMOTRIGINE 200 MG PO TABS: 200.0000 mg | ORAL_TABLET | Freq: Every day | ORAL | 0 refills | Status: DC

## 2019-10-17 MED FILL — PREGABALIN 100 MG CAPS: 100 | 30 days supply | Qty: 60 | Fill #3

## 2019-10-17 MED FILL — CITALOPRAM HBR 40 MG TABLET: 40 | 30 days supply | Qty: 30 | Fill #0

## 2019-10-17 MED FILL — VIIBRYD 10 MG TABLET: 10 | 30 days supply | Qty: 30 | Fill #1

## 2019-10-17 MED FILL — lamoTRIgine 200 MG TABS: 200 | 30 days supply | Qty: 30 | Fill #0

## 2019-10-19 ENCOUNTER — Ambulatory Visit (INDEPENDENT_AMBULATORY_CARE_PROVIDER_SITE_OTHER): Payer: 59 | Admitting: Licensed Clinical Social Worker

## 2019-10-19 DIAGNOSIS — F331 Major depressive disorder, recurrent, moderate: Secondary | ICD-10-CM

## 2019-10-19 DIAGNOSIS — F411 Generalized anxiety disorder: Secondary | ICD-10-CM

## 2019-10-19 DIAGNOSIS — F0630 Mood disorder due to known physiological condition, unspecified: Secondary | ICD-10-CM

## 2019-10-19 NOTE — Progress Notes (Signed)
Na

## 2019-10-27 ENCOUNTER — Other Ambulatory Visit: Payer: Self-pay

## 2019-10-27 ENCOUNTER — Ambulatory Visit (INDEPENDENT_AMBULATORY_CARE_PROVIDER_SITE_OTHER): Payer: 59 | Admitting: Licensed Clinical Social Worker

## 2019-10-27 DIAGNOSIS — F411 Generalized anxiety disorder: Secondary | ICD-10-CM | POA: Diagnosis not present

## 2019-10-27 DIAGNOSIS — F0630 Mood disorder due to known physiological condition, unspecified: Secondary | ICD-10-CM | POA: Diagnosis not present

## 2019-10-27 DIAGNOSIS — F331 Major depressive disorder, recurrent, moderate: Secondary | ICD-10-CM

## 2019-10-27 DIAGNOSIS — F410 Panic disorder [episodic paroxysmal anxiety] without agoraphobia: Secondary | ICD-10-CM | POA: Diagnosis not present

## 2019-10-27 NOTE — Progress Notes (Signed)
Virtual Visit via Video Note  I connected with Shannon [name] on 10/27/19 at  8:00 AM EST by a video enabled telemedicine application and verified that I am speaking with the correct person using two identifiers.   I discussed the limitations of evaluation and management by telemedicine and the availability of in person appointments. The patient expressed understanding and agreed to proceed.  I discussed the assessment and treatment plan with the patient. The patient was provided an opportunity to ask questions and all were answered. The patient agreed with the plan and demonstrated an understanding of the instructions.   The patient was advised to call back or seek an in-person evaluation if the symptoms worsen or if the condition fails to improve as anticipated.  I provided 55 minutes of non-face-to-face time during this encounter.   THERAPIST PROGRESS NOTE  Session Time: 8:01 AM to 8:56 AM  Participation Level: Active  Behavioral Response: CasualAlertDepressed  Type of Therapy: Individual Therapy  Treatment Goals addressed:   elevate mood and show evidence of usual energy, activities and socialization level   Interventions: CBT, Solution Focused, Strength-based, Supportive and Other: grief, coping  Summary: Shannon [name] is a 54 y.o. female who presents with politics have been really stressing her out, "we are too much, divided and it breaks my heart." Made things worse with the COVID. "Glad it is over. I feel a heavy rock off her chest." Had bothered her the fussing back and forth and being divided. Was tearful in sharing this with therapist. Therapist noted significant depression and discussed behavior strategies that will help with mood. Patient shares has been cleaning out closets and therapist said this is more like work and finding something that we will bring some joy, possibly something where she works on herself and will help her feel good.  Doing things for herself help mood. Sleep  apnea should be wearing CPAP contributes to being down. Thinks it doesn't help but thinks that is probably in her head.  [place] importance of sleep and patient agrees to start using CPAP. Also tired all the time. 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 Increased medicine. Still has flare ups.  There is she would like to try massage.  If doesn't help then get injections.  Therapist pointed this out as part of plan for next session to get a massage and start CPAP.  Reviewed patient's day and relates being tired. Works weekends.  Reviewed there is time for patient to do something during the day that would be positive for her.  Patient then talked about her dad.  Misses her dad so much. He was always there for [place]. Showed therapist a picture of him "That was the last picture of him smiling." Reviewed any guilt feelings about his death, and patient has some if she hadn't gone to her sisters, she wouldn't have been tired and would have been there quicker and maybe he wouldn't have fallen. She also has recognition that "The lord takes them when it is the their time." Worried about mom, with COVID patient more susceptible with going into [place] rooms. May not be able to see her depending on the weekend. Shared about when in hospital he was in so much pain and seeing him in so much pain tore them to pieces. Hard seeing him like that he was always a strong man and he never complained about anything. He was a positive man. I went through a  lot. My daddy knew me and wanted me to make rational decision. He was my protector. With her ex-husband felt like he went through some change of life. Found emails from women and said didn't love me anymore. Changed over night without any warning. I was devastated. Her dad took son under his wing like his own. Son was the joy of his like.  His grandchildren was his best joy..Always doing something with his hands. Retiring broke his heart. Loved  his beach favorite place to go. Beach brings her joy because my daddy loved it. When she looks out at the water she thinks that God made such a beautiful thing.      Suicidal/Homicidal: No  Therapist Response: Reviewed symptoms, facilitated expression of thoughts and feelings, processed patient's feelings related to grief and loss of her father, encourage patient to talk about him to help in sharing her feelings that will help with her working through feelings.  Provided some insights about grief to help with grieving process recognizing grief is not a.  In a sentence but a process that shapes, she has less lifetime and can in rituals.  Reviewed understanding from for that do not get over it and why would you only way of perpetuating that love that you do not want to relinquish.  Utilize reframing to look at appreciating having a father who was a kind and good person and being grateful for that.  Reviewed finding a way to stay connected and discussed different ways to do that helps with grieving process.  Reviewed 2 stages discussed by one expert when we are feeling bad and then when we are feeling better.  The one where you are feeling better which extends for the rest of your life.  Reviewed behavior activation to help with depression including doing something positive for herself daily.  Explained that person is depressed needs positive reinforcers to help change mood and encourage patient with setting a goal for the day.  Reviewed as well other things that contribute that patient will start using CPAP, also will get a massage to help with her physical issues.  Provided strength based on supportive intervention  Plan: Return again in 2 weeks.2.Patient will get massage and start using C-PAP. 3.  Therapist encourage positive activities to help with mood, work with patient on coping strategies for mood regulation, anxiety and depression  Diagnosis: Axis I:   mood disorder unspecified, major depressive  disorder, recurrent, moderate, generalized anxiety disorder, panic disorder    Axis II: No diagnosis    Cordella Register, LCSW 10/27/2019

## 2019-11-04 ENCOUNTER — Telehealth (HOSPITAL_COMMUNITY): Payer: Self-pay

## 2019-11-04 MED ORDER — ALPRAZOLAM 0.5 MG PO TABS: 0.5000 mg | ORAL_TABLET | Freq: Every day | ORAL | 0 refills | Status: DC | PRN

## 2019-11-04 MED FILL — ALPRAZolam 0.5 MG TABS: 0.5 | 30 days supply | Qty: 30 | Fill #0

## 2019-11-04 NOTE — Telephone Encounter (Signed)
Patient needs Xanax refill sent to [facility]

## 2019-11-08 ENCOUNTER — Ambulatory Visit (HOSPITAL_COMMUNITY): Payer: 59 | Admitting: Licensed Clinical Social Worker

## 2019-11-09 ENCOUNTER — Ambulatory Visit (INDEPENDENT_AMBULATORY_CARE_PROVIDER_SITE_OTHER): Payer: 59 | Admitting: Licensed Clinical Social Worker

## 2019-11-09 DIAGNOSIS — F331 Major depressive disorder, recurrent, moderate: Secondary | ICD-10-CM | POA: Diagnosis not present

## 2019-11-09 DIAGNOSIS — F411 Generalized anxiety disorder: Secondary | ICD-10-CM | POA: Diagnosis not present

## 2019-11-09 DIAGNOSIS — F410 Panic disorder [episodic paroxysmal anxiety] without agoraphobia: Secondary | ICD-10-CM

## 2019-11-09 DIAGNOSIS — F0630 Mood disorder due to known physiological condition, unspecified: Secondary | ICD-10-CM

## 2019-11-09 NOTE — Progress Notes (Signed)
Virtual Visit via Video Note  I connected with Shannon [name] on 11/09/19 at  9:00 AM EST by a video enabled telemedicine application and verified that I am speaking with the correct person using two identifiers.   I discussed the limitations of evaluation and management by telemedicine and the availability of in person appointments. The patient expressed understanding and agreed to proceed.  I discussed the assessment and treatment plan with the patient. The patient was provided an opportunity to ask questions and all were answered. The patient agreed with the plan and demonstrated an understanding of the instructions.   The patient was advised to call back or seek an in-person evaluation if the symptoms worsen or if the condition fails to improve as anticipated.  I provided 55 minutes of non-face-to-face time during this encounter.   THERAPIST PROGRESS NOTE  Session Time: 9:01 AM to 9:56 AM  Participation Level: Active  Behavioral Response: CasualAlertDepressed  Type of Therapy: Individual Therapy  Treatment Goals addressed:   elevate mood and show evidence of usual energy, activities and socialization level  Interventions: CBT, Solution Focused, Strength-based, Supportive and Other: coping, reframing  Summary: Shannon [name] is a 54 y.o. female who presents with check in emotionally "I am here". It has been a rough week. Aunt passed of COVID, and shared some of the difficult experiences she is having a work. Seeing some of the devastating experience of people who have COVID. A patient who was not doing well on a lot of oxygen and I held his hand and he was scared. (shouldn't have been holding his hand) Only 55 years older than me. Prayed about it. Reviewed these events led her to get the vaccine. I decided it was the best thing to do to protect my loved ones. Coded a woman five times, died and it is so sad. I cried and cried. My friend's husband on a ventilator and then former youth pastor on a  ventilator. All this got her to take the shot. When home try to not think about it focus on son and two cats, in coping with this situation it helps to be away hospital. Monday cried, talked to sister and went to see her mom because her sister passed.  Reviewed goals from last session and she is wearing her CPAP and it is helping her to be not so tired. Not going to get a massage with the way COVID going. Therapist agreed this was a good decision. Reviewed other strategies to help her and doctor will do pressure points but going to call about the shots they are epidural shots.  Reviewed patient's thoughts on behavior activation and she believes it is true to do things to get your mind off of things and to give you energy. Has a journal and wrote in it last week. Writes things about what happened and emotions. Discussed it as helpful tool.  Reviewed activities she could do for behavioral activation to put in her schedule every day and she relates journaling, doing a puzzle, music, care of her pets rearranging furniture, taking pictures. Discussed making a schedule. Doesn't like a schedule and when has one she doesn't do it. She will think about the day and what she is going to do from the list of activities.   Suicidal/Homicidal: No  Therapist Response: Reviewed symptoms, discussed stressors, facilitated expression of thoughts and feelings to help patient in coping with significant stressors this week.  Utilize reframing in dealing with Covid patients to first  point out motivation to embrace her life even more recognizing the value of our life, also the meaningful work patient is doing and working with these patients.  Provided positive feedback for patient getting the shot to help protect.  Discussed journals as a treatment told and encourage patient to continue with journaling.  Introduced behavior activation explaining That it has been on par with medication and slightly superior to cognitive therapy.   Reviewed a model suggests that negative life events such as grief, trauma or daily stressors can lead a person having too little positive reinforcement.  Additionally person return to unhealthy behaviors such as sleeping too late social withdraw in attempt to avoid the negative feelings.  Reviewed this leads to increased negative mood.  With behavior activation a person increases the amount of positive reinforcement they experience and they and negative behavior patterns that cause depression to worsen.  These rewarding behaviors increases a person's positive reinforcement and reduce his negative reinforcement.  Reviewed with patient activities that she could schedule in her day regularly increased with positive activities that will help with mood.  Plan: Return again in 2 weeks.2.Patient will engage in activities daily from ones identified in session to help improve mood and continue to journal. 3.Therapist continue to work with patient on depression and grief.  Diagnosis: Axis I:  mood disorder unspecified, major depressive disorder, recurrent, moderate, generalized anxiety disorder, panic disorder   Axis II: No diagnosis    Cordella Register, LCSW 11/09/2019

## 2019-11-18 ENCOUNTER — Other Ambulatory Visit (HOSPITAL_COMMUNITY): Payer: Self-pay | Admitting: Psychiatry

## 2019-11-18 ENCOUNTER — Other Ambulatory Visit: Payer: Self-pay | Admitting: Sports Medicine

## 2019-11-18 DIAGNOSIS — M7918 Myalgia, other site: Secondary | ICD-10-CM

## 2019-11-18 DIAGNOSIS — F331 Major depressive disorder, recurrent, moderate: Secondary | ICD-10-CM

## 2019-11-18 MED FILL — PREGABALIN 100 MG CAPS: 100 | 30 days supply | Qty: 60 | Fill #0

## 2019-11-18 MED FILL — lamoTRIgine 200 MG TABS: 200 | 30 days supply | Qty: 30 | Fill #0

## 2019-11-18 MED FILL — VIIBRYD 10 MG TABLET: 10 | 30 days supply | Qty: 30 | Fill #2

## 2019-11-18 MED FILL — CITALOPRAM HBR 40 MG TABLET: 40 | 30 days supply | Qty: 30 | Fill #0

## 2019-11-22 ENCOUNTER — Ambulatory Visit (INDEPENDENT_AMBULATORY_CARE_PROVIDER_SITE_OTHER): Payer: 59 | Admitting: Licensed Clinical Social Worker

## 2019-11-22 DIAGNOSIS — F0630 Mood disorder due to known physiological condition, unspecified: Secondary | ICD-10-CM

## 2019-11-22 DIAGNOSIS — Z5329 Procedure and treatment not carried out because of patient's decision for other reasons: Secondary | ICD-10-CM

## 2019-11-22 DIAGNOSIS — F331 Major depressive disorder, recurrent, moderate: Secondary | ICD-10-CM

## 2019-11-22 DIAGNOSIS — F410 Panic disorder [episodic paroxysmal anxiety] without agoraphobia: Secondary | ICD-10-CM

## 2019-11-22 DIAGNOSIS — F411 Generalized anxiety disorder: Secondary | ICD-10-CM

## 2019-11-22 NOTE — Progress Notes (Signed)
Patient shared today she had a migraine and didn't feel good when she was talking. She rescheduled her appointment

## 2019-11-23 ENCOUNTER — Telehealth (INDEPENDENT_AMBULATORY_CARE_PROVIDER_SITE_OTHER): Payer: 59 | Admitting: Physician Assistant

## 2019-11-23 ENCOUNTER — Other Ambulatory Visit: Payer: Self-pay

## 2019-11-23 ENCOUNTER — Encounter: Payer: Self-pay | Admitting: Physician Assistant

## 2019-11-23 VITALS — Temp 98.3°F | Ht 65.5 in | Wt 225.0 lb

## 2019-11-23 DIAGNOSIS — Z6836 Body mass index (BMI) 36.0-36.9, adult: Secondary | ICD-10-CM | POA: Diagnosis not present

## 2019-11-23 DIAGNOSIS — R42 Dizziness and giddiness: Secondary | ICD-10-CM

## 2019-11-23 DIAGNOSIS — N3281 Overactive bladder: Secondary | ICD-10-CM | POA: Diagnosis not present

## 2019-11-23 DIAGNOSIS — E6609 Other obesity due to excess calories: Secondary | ICD-10-CM

## 2019-11-23 MED ORDER — MECLIZINE HCL 50 MG PO TABS: 50.0000 mg | ORAL_TABLET | Freq: Three times a day (TID) | ORAL | 0 refills | Status: DC | PRN

## 2019-11-23 MED ORDER — SOLIFENACIN SUCCINATE 10 MG PO TABS: 10.0000 mg | ORAL_TABLET | Freq: Every day | ORAL | 3 refills | Status: DC

## 2019-11-23 MED FILL — SOLIFENACIN SUCCINATE 10 MG: 10 | 90 days supply | Qty: 90 | Fill #0

## 2019-11-23 MED FILL — MECLIZINE 25 MG TABLET: 25 | 10 days supply | Qty: 60 | Fill #0

## 2019-11-23 NOTE — Progress Notes (Signed)
Restarting incontinence medication Had bariatric surgery and stopped medication (unsure medication name, I found Vesicare on profile and pended RX) SX started 6-7 months ago and recently worsened Has 2nd dose COVID vaccine Monday

## 2019-11-25 NOTE — Progress Notes (Addendum)
Patient ID: Shannon [name], female   DOB: 10/20/1966, 54 y.o.   MRN: [id number] .Marland KitchenVirtual Visit via Video Note  I connected with Wahneta D Kliethermes on 11/23/2019 at  2:00 PM EST by a video enabled telemedicine application and verified that I am speaking with the correct person using two identifiers.  Location: Patient: home Provider: clinic   I discussed the limitations of evaluation and management by telemedicine and the availability of in person appointments. The patient expressed understanding and agreed to proceed.  History of Present Illness: Pt is a 54 yo female who calls into the clinic to restart OAB medication. She has not needed it for a few years but recently started having more problems. She was able to go off of it after her bariatric surgery and weight loss. Her weight has started to come back and urinary symptoms for the last 6-7 months. She would like to restart. No pain, discharge. She has frequent urination with urge and stress incontinence.   Pt also has a hx of vertigo. She uses antivert as needed she would like rx sent to pharmacy. No dizziness today.  .. Active Ambulatory Problems    Diagnosis Date Noted  . Hyperlipidemia 11/22/2008  . Sleep apnea 02/05/2012  . Mood disorder ([place]) 02/05/2012  . OSA on CPAP 03/31/2014  . Abnormal weight gain 03/31/2014  . Class 2 obesity due to excess calories without serious comorbidity with body mass index (BMI) of 36.0 to 36.9 in adult 03/31/2014  . Hypothyroidism 08/15/2014  . Diabetes mellitus type II, controlled ([place]) 08/15/2014  . DDD (degenerative disc disease), lumbar 08/28/2014  . Generalized anxiety disorder 03/16/2015  . Hiatal hernia 08/13/2015  . 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 Cervical myofascial pain syndrome 07/31/2016  . Fibroids 10/30/2016  . 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 Iron deficiency anemia secondary to inadequate dietary iron intake 03/25/2018  . Chest pain 05/09/2018  . BPPV (benign paroxysmal positional  vertigo), right 05/09/2018  . Polyp of colon 05/21/2018  . Primary osteoarthritis of right knee 10/06/2018  . Acute lateral meniscus tear of right knee 03/17/2019  . Loss of balance 05/24/2019  . Vertigo 05/24/2019  . OAB (overactive bladder) 11/23/2019   Resolved Ambulatory Problems    Diagnosis Date Noted  . Unspecified otitis media 11/22/2008  . Acute sinusitis, unspecified 11/22/2008  . URI 09/28/2009  . BACK PAIN, LUMBAR 12/26/2008  . Burning sensation of the foot 08/28/2014  . Numbness of both lower extremities 08/28/2014  . BMI 38.0-38.9,adult 12/29/2014  . Status post gastric surgery 11/12/2015   Past Medical History:  Diagnosis Date  . Anxiety   . Arthritis   . Depression   . GERD (gastroesophageal reflux disease)   . HPV (human papilloma virus) infection   . Lateral meniscus tear   . PONV (postoperative nausea and vomiting)   . Pre-diabetes   . Shortness of breath dyspnea   . Stress incontinence        Observations/Objective: No acute distress Normal mood and appearance.  No dizziness today.   .. Today's Vitals   11/23/19 1323  Temp: 98.3 F (36.8 C)  TempSrc: Oral  Weight: 225 lb (102.1 kg)  Height: 5' 5.5" (1.664 m)   Body mass index is 36.87 kg/m.    Assessment and Plan: Marland KitchenMarland KitchenAmy was seen today for medication management.  Diagnoses and all orders for this visit:  OAB (overactive bladder) -     solifenacin (VESICARE) 10 MG tablet; Take 1 tablet (10 mg  total) by mouth daily.  Vertigo -     meclizine (ANTIVERT) 50 MG tablet; Take 1 tablet (50 mg total) by mouth 3 (three) times daily as needed for dizziness or nausea.  Class 2 obesity due to excess calories without serious comorbidity with body mass index (BMI) of 36.0 to 36.9 in adult   Discussed OAB. Encouraged kegels. Restart vesicare.  She has gained weight. Discussed plan for diet and exercise changes.  Vertigo controlled. antivert as needed. Consider flonase intermittently. Discussed  epley manuevers.   Spent 15 minutes with patient and with chart review.    Follow Up Instructions:    I discussed the assessment and treatment plan with the patient. The patient was provided an opportunity to ask questions and all were answered. The patient agreed with the plan and demonstrated an understanding of the instructions.   The patient was advised to call back or seek an in-person evaluation if the symptoms worsen or if the condition fails to improve as anticipated.   Iran Planas, PA-C

## 2019-11-28 ENCOUNTER — Encounter: Payer: Self-pay | Admitting: Physician Assistant

## 2019-12-01 ENCOUNTER — Ambulatory Visit (HOSPITAL_COMMUNITY): Payer: 59 | Admitting: Licensed Clinical Social Worker

## 2019-12-13 ENCOUNTER — Ambulatory Visit (INDEPENDENT_AMBULATORY_CARE_PROVIDER_SITE_OTHER): Payer: 59 | Admitting: Licensed Clinical Social Worker

## 2019-12-13 DIAGNOSIS — F0630 Mood disorder due to known physiological condition, unspecified: Secondary | ICD-10-CM | POA: Diagnosis not present

## 2019-12-13 DIAGNOSIS — F411 Generalized anxiety disorder: Secondary | ICD-10-CM | POA: Diagnosis not present

## 2019-12-13 DIAGNOSIS — F410 Panic disorder [episodic paroxysmal anxiety] without agoraphobia: Secondary | ICD-10-CM | POA: Diagnosis not present

## 2019-12-13 DIAGNOSIS — F331 Major depressive disorder, recurrent, moderate: Secondary | ICD-10-CM | POA: Diagnosis not present

## 2019-12-13 NOTE — Progress Notes (Signed)
Virtual Visit via Video Note  I connected with Shannon [name] on 12/13/19 at  1:00 PM EST by a video enabled telemedicine application and verified that I am speaking with the correct person using two identifiers.   I discussed the limitations of evaluation and management by telemedicine and the availability of in person appointments. The patient expressed understanding and agreed to proceed.  I discussed the assessment and treatment plan with the patient. The patient was provided an opportunity to ask questions and all were answered. The patient agreed with the plan and demonstrated an understanding of the instructions.   The patient was advised to call back or seek an in-person evaluation if the symptoms worsen or if the condition fails to improve as anticipated.  I provided 50 minutes of non-face-to-face time during this encounter.  THERAPIST PROGRESS NOTE  Session Time: 1:00 PM to 1:50 PM  Participation Level: Active  Behavioral Response: CasualAlertDepressed  Type of Therapy: Individual Therapy  Treatment Goals addressed:  elevate mood and show evidence of usual energy, activities and socialization level   Interventions: CBT, Solution Focused, Strength-based, Supportive, Reframing and Other: self-esteem  Summary: Shannon [name] is a 54 y.o. female who presents with continued issues with depressed mood but reports on days she goes back and forth and that she is going to do stuff and then doesn't  do it. Describes it as "kind of wishy washy". Explored what is keeping her from doing it? Part of depression. Explored in more detail what she did not end up doing and it was seeing her boyfriend. They were going to get together to celebrate Thanksgiving and her Rudene Anda was going back and forth about it and then didn't go. They were going to go out to eat. A lot of it not feeling good about herself. See each other a couple times a month. Explored why feel like that. Feels not pretty. Explored inaccuracy  of thoughts. Has been going on for awhile good and bad days. Weight loss and gained a lot of weight back. Work on more accurate perspective of herself and self-esteem skills to help with this.  Therapist asked about patient's mood in session as she presented as not enthusiastic about suggestions.  Patient relates she knows she needs to get back on a strict diet and it will not be easy and that is one of the reasons.  Therapist offered supportive intervention.  Reviewed self nurturing activities she can do for next time and she said she is to look at stars so she will do this and will take a step toward working on weight by buying food.  Reviewed affirmation and will continue this next session to find one that will be helpful for her.  Suicidal/Homicidal: No  Therapist Response: Therapist reviewed symptoms, facilitated expression of thoughts and feelings, explored with patient more in depth her lack of motivation to engage in activities.  Uncovered patient does not feel good about herself related to gaining weight back.  Reviewed strategies to work on self-esteem including recognition of her unconditional self-worth, not based on externals with such things as how she looks or how much money she makes.  Reviewed developing a narrative based on experiences that we internalize voices from the past that become part of her narrative and we have to be more observant so change parts that are inaccurate.  Identify patient feels its her own internal voice but will continue to explore.  Discussed mindfulness as helpful to become more aware of negative  self talk so once more aware she can distance herself from the negative self talk.  Also discussed affirmations as developing new habits of thoughts that will help her feel better.  Validated patient how she was feeling related to discouragement of gaining weight.  Encouraged her with self nurturing activities that help her to recognize her own value, appreciate herself by  taking care of herself.  Provided strength based and supportive intervention.  Plan: Return again in 2 weeks.2.  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

## 2019-12-15 ENCOUNTER — Encounter (HOSPITAL_COMMUNITY): Payer: Self-pay | Admitting: Psychiatry

## 2019-12-15 ENCOUNTER — Ambulatory Visit (INDEPENDENT_AMBULATORY_CARE_PROVIDER_SITE_OTHER): Payer: 59 | Admitting: Psychiatry

## 2019-12-15 ENCOUNTER — Other Ambulatory Visit: Payer: Self-pay

## 2019-12-15 DIAGNOSIS — F331 Major depressive disorder, recurrent, moderate: Secondary | ICD-10-CM

## 2019-12-15 DIAGNOSIS — F410 Panic disorder [episodic paroxysmal anxiety] without agoraphobia: Secondary | ICD-10-CM

## 2019-12-15 DIAGNOSIS — F411 Generalized anxiety disorder: Secondary | ICD-10-CM

## 2019-12-15 DIAGNOSIS — F0630 Mood disorder due to known physiological condition, unspecified: Secondary | ICD-10-CM

## 2019-12-15 MED ORDER — VIIBRYD 10 MG PO TABS: ORAL_TABLET | ORAL | 2 refills | Status: DC

## 2019-12-15 MED ORDER — LAMOTRIGINE 200 MG PO TABS: 200.0000 mg | ORAL_TABLET | Freq: Every day | ORAL | 2 refills | Status: DC

## 2019-12-15 MED ORDER — ALPRAZOLAM 0.5 MG PO TABS: 0.5000 mg | ORAL_TABLET | Freq: Every day | ORAL | 0 refills | Status: DC | PRN

## 2019-12-15 MED ORDER — CITALOPRAM HYDROBROMIDE 40 MG PO TABS: 40.0000 mg | ORAL_TABLET | Freq: Every day | ORAL | 2 refills | Status: DC

## 2019-12-15 MED FILL — CITALOPRAM HBR 40 MG TABLET: 40 | 30 days supply | Qty: 30 | Fill #0

## 2019-12-15 MED FILL — lamoTRIgine 200 MG TABS: 200 | 30 days supply | Qty: 30 | Fill #0

## 2019-12-15 MED FILL — VIIBRYD 10 MG TABLET: 10 | 30 days supply | Qty: 30 | Fill #0

## 2019-12-15 NOTE — Progress Notes (Signed)
Patient ID: Shannon [name], female   DOB: 1966-04-05, 54 y.o.   MRN: [id number]  [facility] Follow up visit  Shannon [name] [id number] 54 y.o.  12/15/2019 11:36 AM  Chief Complaint:  Depression follow up   History of Present Illness:    I connected with Shannon [name] on 12/15/19 at 11:30 AM EST by telephone and verified that I am speaking with the correct person using two identifiers.   I discussed the limitations, risks, security and privacy concerns of performing an evaluation and management service by telephone and the availability of in person appointments. I also discussed with the patient that there may be a patient responsible charge related to this service. The patient expressed understanding and agreed to proceed.  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 Managing work  No side effects.   Duration  5 plus years    Modifying factors : son, crafts Past Psychiatric History/Hospitalization(s) Manged for depression and mood symptoms for more then 20 years.  Prior Suicide Attempts: No  Medical History; Past Medical History:  Diagnosis Date  . Anxiety   . Arthritis    back and hips   . Depression   . GERD (gastroesophageal reflux disease)   . HPV (human papilloma virus) infection   . Hyperlipidemia   . Lateral meniscus tear    left knee  . PONV (postoperative nausea and vomiting)    slow to wake up   . Pre-diabetes   . Shortness of breath dyspnea    with exertion   . Sleep apnea    cpap -0 setting at 16, uses CPAP nightly  . Stress incontinence     Allergies: Allergies  Allergen Reactions  . Food Swelling    Big Red Gum makes her tongue swell  . Latuda [Lurasidone Hcl] Other (See Comments)    Severe aggitation  . Lipitor [Atorvastatin] Other (See Comments)    Muscle aches  . Belviq [Lorcaserin Hcl]     Increased  appetitie  . Contrave [Naltrexone-Bupropion Hcl Er] Other (See Comments)    Sleepy.    Medications: Outpatient Encounter Medications as of 12/15/2019  Medication Sig  . ALPRAZolam (XANAX) 0.5 MG tablet Take 1 tablet (0.5 mg total) by mouth daily as needed for anxiety.  . citalopram (CELEXA) 40 MG tablet Take 1 tablet (40 mg total) by mouth daily.  . Ferrous Sulfate (IRON) 325 (65 Fe) MG TABS Take 65 mg by mouth daily.  Marland Kitchen HYDROcodone-acetaminophen (NORCO/VICODIN) 5-325 MG tablet Take 1-2 tablets by mouth every 6 (six) hours as needed for moderate pain.  Marland Kitchen lamoTRIgine (LAMICTAL) 200 MG tablet Take 1 tablet (200 mg total) by mouth daily.  . meclizine (ANTIVERT) 50 MG tablet Take 1 tablet (50 mg total) by mouth 3 (three) times daily as needed for dizziness or nausea.  . pregabalin (LYRICA) 100 MG capsule TAKE 1 CAPSULE (100 MG TOTAL) BY MOUTH 2 (TWO) TIMES DAILY.  Marland Kitchen solifenacin (VESICARE) 10 MG tablet Take 1 tablet (10 mg total) by mouth daily.  . Vilazodone HCl (VIIBRYD) 10 MG TABS TAKE 1 TABLET (10 MG TOTAL) BY MOUTH DAILY.  . [DISCONTINUED] ALPRAZolam (XANAX) 0.5 MG tablet Take 1 tablet (0.5 mg total) by mouth daily as needed for anxiety.  . [DISCONTINUED] citalopram (CELEXA) 40 MG tablet TAKE 1 TABLET (40 MG TOTAL) BY MOUTH DAILY.  . [DISCONTINUED]  lamoTRIgine (LAMICTAL) 200 MG tablet TAKE 1 TABLET (200 MG TOTAL) BY MOUTH DAILY.  . [DISCONTINUED] VIIBRYD 10 MG TABS TAKE 1 TABLET (10 MG TOTAL) BY MOUTH DAILY.   No facility-administered encounter medications on file as of 12/15/2019.     Family History; Family History  Problem Relation Age of Onset  . Hyperlipidemia Mother   . Sleep apnea Mother   . Depression Mother   . Diabetes Father   . Hypertension Father   . COPD Father   . Diabetes Paternal Aunt   . Diabetes Paternal Uncle   . Alcohol abuse Paternal Uncle   . Alcohol abuse Paternal Grandfather   . Depression Sister       Labs:  No results found for this or any  previous visit (from the past 2160 hour(s)).      Mental Status Examination;   Psychiatric Specialty Exam: Physical Exam  Review of Systems  Cardiovascular: Negative for palpitations.  Skin: Negative for itching.  Psychiatric/Behavioral: Negative for hallucinations.    There were no vitals taken for this visit.There is no height or weight on file to calculate BMI.  General Appearance:  Eye Contact::    Speech:  Normal Rate  Volume:  Normal  Mood fair  Affect:  Congruent   Thought Process:  Coherent  Orientation:  Full (Time, Place, and Person)  Thought Content:  Rumination  Suicidal Thoughts:  No  Homicidal Thoughts:  No  Memory:  Immediate;   Fair Recent;   Fair  Judgement:  Fair  Insight:  Shallow  Psychomotor Activity:  Normal  Concentration:  Fair  Recall:  Fair  Akathisia:  Negative  Handed:  Right  AIMS (if indicated):     Assets:  Communication Skills Desire for Improvement Financial Resources/Insurance Housing  Sleep:        Assessment: Axis I: mood disorder unspecified. Rule out mood disorder secondary to general medical condition. Major depressive disorder recurrent moderate. Rule out panic disorder. Generalized anxiety disorder   Axis III:  Past Medical History:  Diagnosis Date  . Anxiety   . Arthritis    back and hips   . Depression   . GERD (gastroesophageal reflux disease)   . HPV (human papilloma virus) infection   . Hyperlipidemia   . Lateral meniscus tear    left knee  . PONV (postoperative nausea and vomiting)    slow to wake up   . Pre-diabetes   . Shortness of breath dyspnea    with exertion   . Sleep apnea    cpap -0 setting at 16, uses CPAP nightly  . Stress incontinence     Axis IV: psychosocial   Treatment Plan and Summary:  Depression: fair continue lamictal, celexa, vibryd, also therapy helps, will continue   No rash or side effects. Provided grief supportive therapy  Anxiety fluctuates, but not worse, continue  celexa, xanax and therapy  Panic attacks improved, continue xanax, celexa   I discussed the assessment and treatment plan with the patient. The patient was provided an opportunity to ask questions and all were answered. The patient agreed with the plan and demonstrated an understanding of the instructions.   The patient was advised to call back or seek an in-person evaluation if the symptoms worsen or if the condition fails to improve as anticipated. Fu 3-24m.  Merian Capron, MD 12/15/2019

## 2019-12-16 ENCOUNTER — Other Ambulatory Visit (HOSPITAL_COMMUNITY): Payer: Self-pay | Admitting: Psychiatry

## 2019-12-16 MED FILL — MECLIZINE 25 MG TABLET: 25 | 10 days supply | Qty: 60 | Fill #0

## 2019-12-16 MED FILL — SOLIFENACIN SUCCINATE 10 MG: 10 | 90 days supply | Qty: 90 | Fill #0

## 2019-12-16 MED FILL — PREGABALIN 100 MG CAPS: 100 | 30 days supply | Qty: 60 | Fill #1

## 2019-12-19 ENCOUNTER — Telehealth (HOSPITAL_COMMUNITY): Payer: Self-pay

## 2019-12-19 NOTE — Telephone Encounter (Signed)
Xanax did not go thru to the pharmacy. Please resend.

## 2019-12-20 MED FILL — ALPRAZolam 0.5 MG TABS: 0.5 | 30 days supply | Qty: 30 | Fill #0

## 2019-12-20 NOTE — Telephone Encounter (Signed)
Ok sent!

## 2019-12-29 ENCOUNTER — Ambulatory Visit (HOSPITAL_COMMUNITY): Payer: 59 | Admitting: Licensed Clinical Social Worker

## 2020-01-06 ENCOUNTER — Telehealth: Payer: Self-pay | Admitting: Orthopaedic Surgery

## 2020-01-06 NOTE — Telephone Encounter (Signed)
Please submit for gel inj ?

## 2020-01-06 NOTE — Telephone Encounter (Signed)
Patient called.   She wants to get set up with another gel injection .   Call back: [phone]

## 2020-01-09 ENCOUNTER — Telehealth: Payer: Self-pay

## 2020-01-09 NOTE — Telephone Encounter (Signed)
Submitted for VOB for monovisc-Right knee

## 2020-01-09 NOTE — Telephone Encounter (Signed)
Called and started PA for this pt with [facility]. Per [facility] this can take up to 15 days for a response

## 2020-01-09 NOTE — Telephone Encounter (Signed)
Lvm for pt informing her that her message was received and it is being worked on

## 2020-01-16 ENCOUNTER — Telehealth: Payer: Self-pay

## 2020-01-16 NOTE — Telephone Encounter (Signed)
Approved for Durolane-Right knee Dr. Margarito Liner and Bill  $50 copay NO OOP-Covered @ 100% after $50 copay No prior auth required

## 2020-01-16 NOTE — Telephone Encounter (Signed)
Also, pt insurance prefers Durolane and not monovisc. Had to change medication due to this.

## 2020-01-16 NOTE — Telephone Encounter (Signed)
LVM for pt to call back to schedule.

## 2020-01-16 NOTE — Telephone Encounter (Signed)
Ok to schedule at next available?

## 2020-01-17 ENCOUNTER — Ambulatory Visit (HOSPITAL_COMMUNITY): Payer: 59 | Admitting: Licensed Clinical Social Worker

## 2020-01-17 NOTE — Telephone Encounter (Signed)
I see that you schedule this pt for a followup with Dr. [name]. Did you let her know about her $50 copay?

## 2020-01-25 ENCOUNTER — Other Ambulatory Visit (HOSPITAL_COMMUNITY): Payer: Self-pay | Admitting: Psychiatry

## 2020-01-25 ENCOUNTER — Telehealth (HOSPITAL_COMMUNITY): Payer: Self-pay

## 2020-01-25 MED ORDER — ALPRAZOLAM 0.5 MG PO TABS: 0.5000 mg | ORAL_TABLET | Freq: Every day | ORAL | 0 refills | Status: DC | PRN

## 2020-01-25 MED FILL — ALPRAZolam 0.5 MG TABS: 0.5 | 30 days supply | Qty: 30 | Fill #0

## 2020-01-25 MED FILL — PREGABALIN 100 MG CAPS: 100 | 30 days supply | Qty: 60 | Fill #2

## 2020-01-25 MED FILL — lamoTRIgine 200 MG TABS: 200 | 30 days supply | Qty: 30 | Fill #1

## 2020-01-25 MED FILL — VIIBRYD 10 MG TABLET: 10 | 30 days supply | Qty: 30 | Fill #1

## 2020-01-25 MED FILL — CITALOPRAM HBR 40 MG TABLET: 40 | 30 days supply | Qty: 30 | Fill #1

## 2020-01-25 NOTE — Telephone Encounter (Signed)
This is a Dr. De Nurse patient who needs a refill on Xanax sent to [facility]

## 2020-01-25 NOTE — Telephone Encounter (Signed)
sent 

## 2020-01-27 DIAGNOSIS — G4733 Obstructive sleep apnea (adult) (pediatric): Secondary | ICD-10-CM | POA: Diagnosis not present

## 2020-01-31 ENCOUNTER — Ambulatory Visit: Payer: 59 | Admitting: Orthopaedic Surgery

## 2020-01-31 ENCOUNTER — Other Ambulatory Visit: Payer: Self-pay

## 2020-01-31 ENCOUNTER — Encounter: Payer: Self-pay | Admitting: Orthopaedic Surgery

## 2020-01-31 DIAGNOSIS — M1711 Unilateral primary osteoarthritis, right knee: Secondary | ICD-10-CM | POA: Diagnosis not present

## 2020-01-31 MED ORDER — LIDOCAINE HCL 1 % IJ SOLN
0.5000 mL | INTRAMUSCULAR | Status: AC | PRN
  Administered 2020-01-31: 09:00:00 .5 mL

## 2020-01-31 NOTE — Progress Notes (Signed)
   Procedure Note  Patient: Shannon [name]             Date of Birth: [birth year]           MRN: [id number]             Visit Date: 01/31/2020  HPI: Shannon [name] comes in today for Durolane injection.  Last injection was Monovisc and was helpful until recently.  Most her pain is lateral aspect of the knee.  Pain is worse whenever she gets up from a seated position.  She has had no injury to her knee.  Denies any fevers chills shortness of breath.  Physical exam: Right knee good range of motion.  Positive effusion.  No abnormal warmth erythema.  Ambulates with a slight antalgic gait with for steps after rising from a seated position.  Procedures: Visit Diagnoses:  1. Primary osteoarthritis of right knee     Large Joint Inj: R knee on 01/31/2020 9:22 AM Indications: pain Details: 22 G 1.5 in needle, superolateral approach  Arthrogram: No  Medications: 0.5 mL lidocaine 1 % Aspirate: 11 mL yellow and blood-tinged Outcome: tolerated well, no immediate complications Procedure, treatment alternatives, risks and benefits explained, specific risks discussed. Consent was given by the patient. Immediately prior to procedure a time out was called to verify the correct patient, procedure, equipment, support staff and site/side marked as required. Patient was prepped and draped in the usual sterile fashion.     Plan: She understands to wait at least 6 months between supplemental injections in 3 months between cortisone injections.  She will follow-up with [place] as needed.  Questions encouraged and answered

## 2020-02-16 ENCOUNTER — Other Ambulatory Visit: Payer: Self-pay

## 2020-02-16 ENCOUNTER — Encounter: Payer: Self-pay | Admitting: Nurse Practitioner

## 2020-02-16 ENCOUNTER — Ambulatory Visit (INDEPENDENT_AMBULATORY_CARE_PROVIDER_SITE_OTHER): Payer: 59 | Admitting: Nurse Practitioner

## 2020-02-16 VITALS — BP 140/87 | HR 98 | Temp 98.2°F | Ht 65.5 in | Wt 230.6 lb

## 2020-02-16 DIAGNOSIS — L0291 Cutaneous abscess, unspecified: Secondary | ICD-10-CM

## 2020-02-16 MED ORDER — DOXYCYCLINE HYCLATE 100 MG PO TABS: 100.0000 mg | ORAL_TABLET | Freq: Two times a day (BID) | ORAL | 0 refills | Status: DC

## 2020-02-16 MED ORDER — MUPIROCIN 2 % EX OINT: TOPICAL_OINTMENT | CUTANEOUS | 3 refills | Status: DC

## 2020-02-16 MED FILL — MUPIROCIN 2% OINTMENT: 2 | 7 days supply | Qty: 22 | Fill #0

## 2020-02-16 MED FILL — DOXYCYCLINE HYCLATE 100 MG: 100 | 5 days supply | Qty: 10 | Fill #0

## 2020-02-16 NOTE — Progress Notes (Signed)
Acute Office Visit  Subjective:    Patient ID: Shannon [name], female    DOB: May 31, 1966, 54 y.o.   MRN: [id number]  Chief Complaint  Patient presents with  . Abscess    inner left thigh, present X 10 d, large, looks like it is getting ready to open up    HPI Patient is in today for an abscess on her thigh that has been present for about a week and a half. She reports she has had these before on occasion, but not this large. She felt like the area may start to drain on its own but hasn't opened. It is causing significant discomfort as her pants and legs rub the area causing increased friction and pain.   She denies fever, chills, or other systemic symptoms.  She has not tried to drain it on her own and she has not used any medication to the area.   Past Medical History:  Diagnosis Date  . Anxiety   . Arthritis    back and hips   . Depression   . GERD (gastroesophageal reflux disease)   . HPV (human papilloma virus) infection   . Hyperlipidemia   . Lateral meniscus tear    left knee  . PONV (postoperative nausea and vomiting)    slow to wake up   . Pre-diabetes   . Shortness of breath dyspnea    with exertion   . Sleep apnea    cpap -0 setting at 16, uses CPAP nightly  . Stress incontinence     Past Surgical History:  Procedure Laterality Date  . APPENDECTOMY    . BREATH TEK H PYLORI N/A 04/03/2015   Procedure: BREATH TEK H PYLORI;  Surgeon: Greer Pickerel, MD;  Location: [facility];  Service: General;  Laterality: N/A;  . CESAREAN SECTION    . 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   second interspace, left foot  . feet surgery    . KNEE ARTHROSCOPY Right 03/17/2019   Procedure: RIGHT KNEE ARTHROSCOPY WITH PARTIAL LATERAL MENISCECTOMY, REMOVAL OF LOOSE BODIES, 3 COMPARTMENT CHONDROPLASTY;  Surgeon: Mcarthur Rossetti, MD;  Location: [facility];  Service: Orthopedics;  Laterality:  Right;  . LAPAROSCOPIC GASTRIC SLEEVE RESECTION WITH HIATAL HERNIA REPAIR N/A 08/13/2015   Procedure: LAPAROSCOPIC GASTRIC SLEEVE RESECTION WITH HIATAL HERNIA REPAIR;  Surgeon: Greer Pickerel, MD;  Location: [facility];  Service: General;  Laterality: N/A;  . laproscopy    . UPPER GI ENDOSCOPY  08/13/2015   Procedure: UPPER GI ENDOSCOPY;  Surgeon: Greer Pickerel, MD;  Location: [facility];  Service: General;;  . WISDOM TOOTH EXTRACTION      Family History  Problem Relation Age of Onset  . Hyperlipidemia Mother   . Sleep apnea Mother   . Depression Mother   . Diabetes Father   . Hypertension Father   . COPD Father   . Diabetes Paternal Aunt   . Diabetes Paternal Uncle   . Alcohol abuse Paternal Uncle   . Alcohol abuse Paternal Grandfather   . Depression Sister     Social History   Socioeconomic History  . Marital status: Divorced    Spouse name: Not on file  . Number of children: Not on file  . Years of education: Not on file  . Highest education level: Not on file  Occupational History  . Not on file  Tobacco Use  . Smoking status: Never Smoker  .  Smokeless tobacco: Never Used  Substance and Sexual Activity  . Alcohol use: No    Alcohol/week: 0.0 standard drinks  . Drug use: No  . Sexual activity: Yes    Birth control/protection: Condom, I.U.D.    Comment: IUD inserted 12/12/16  Other Topics Concern  . Not on file  Social History Narrative  . Not on file   Social Determinants of Health   Financial Resource Strain:   . Difficulty of Paying Living Expenses:   Food Insecurity:   . Worried About Charity fundraiser in the Last Year:   . Arboriculturist in the Last Year:   Transportation Needs:   . Film/video editor (Medical):   Marland Kitchen Lack of Transportation (Non-Medical):   Physical Activity:   . Days of Exercise per Week:   . Minutes of Exercise per Session:   Stress:   . Feeling of Stress :   Social Connections:   . Frequency of Communication with Friends and Family:    . Frequency of Social Gatherings with Friends and Family:   . Attends Religious Services:   . Active Member of Clubs or Organizations:   . Attends Archivist Meetings:   Marland Kitchen Marital Status:   Intimate Partner Violence:   . Fear of Current or Ex-Partner:   . Emotionally Abused:   Marland Kitchen Physically Abused:   . Sexually Abused:     Outpatient Medications Prior to Visit  Medication Sig Dispense Refill  . ALPRAZolam (XANAX) 0.5 MG tablet Take 1 tablet (0.5 mg total) by mouth daily as needed. for anxiety 30 tablet 0  . citalopram (CELEXA) 40 MG tablet Take 1 tablet (40 mg total) by mouth daily. 30 tablet 2  . lamoTRIgine (LAMICTAL) 200 MG tablet Take 1 tablet (200 mg total) by mouth daily. 30 tablet 2  . meclizine (ANTIVERT) 50 MG tablet Take 1 tablet (50 mg total) by mouth 3 (three) times daily as needed for dizziness or nausea. 30 tablet 0  . pregabalin (LYRICA) 100 MG capsule TAKE 1 CAPSULE (100 MG TOTAL) BY MOUTH 2 (TWO) TIMES DAILY. 60 capsule 3  . solifenacin (VESICARE) 10 MG tablet Take 1 tablet (10 mg total) by mouth daily. 90 tablet 3  . Vilazodone HCl (VIIBRYD) 10 MG TABS TAKE 1 TABLET (10 MG TOTAL) BY MOUTH DAILY. 30 tablet 2  . Ferrous Sulfate (IRON) 325 (65 Fe) MG TABS Take 65 mg by mouth daily.    Marland Kitchen HYDROcodone-acetaminophen (NORCO/VICODIN) 5-325 MG tablet Take 1-2 tablets by mouth every 6 (six) hours as needed for moderate pain. (Patient not taking: Reported on 02/16/2020) 40 tablet 0   No facility-administered medications prior to visit.    Allergies  Allergen Reactions  . Food Swelling    Big Red Gum makes her tongue swell  . Latuda [Lurasidone Hcl] Other (See Comments)    Severe aggitation  . Lipitor [Atorvastatin] Other (See Comments)    Muscle aches  . Belviq [Lorcaserin Hcl]     Increased appetitie  . Contrave [Naltrexone-Bupropion Hcl Er] Other (See Comments)    Sleepy.    Review of Systems  Constitutional: Negative for appetite change, chills and fever.   Cardiovascular: Negative for chest pain and palpitations.  Musculoskeletal: Negative for arthralgias, joint swelling and myalgias.  Allergic/Immunologic: Negative for immunocompromised state.  Neurological: Negative for weakness and numbness.  Hematological: Negative for adenopathy. Does not bruise/bleed easily.       Objective:    Physical Exam Vitals and nursing  note reviewed.  Constitutional:      Appearance: Normal appearance.  Eyes:     Extraocular Movements: Extraocular movements intact.     Conjunctiva/sclera: Conjunctivae normal.     Pupils: Pupils are equal, round, and reactive to light.  Cardiovascular:     Rate and Rhythm: Normal rate.  Pulmonary:     Effort: Pulmonary effort is normal.  Abdominal:     General: Abdomen is flat.     Palpations: Abdomen is soft.  Musculoskeletal:        General: Normal range of motion.  Skin:    General: Skin is warm.     Capillary Refill: Capillary refill takes less than 2 seconds.     Findings: Abscess, ecchymosis and erythema present.       Neurological:     General: No focal deficit present.     Mental Status: She is alert and oriented to person, place, and time.     Sensory: No sensory deficit.     Coordination: Coordination normal.     Gait: Gait normal.  Psychiatric:        Mood and Affect: Mood normal.        Behavior: Behavior normal.        Thought Content: Thought content normal.        Judgment: Judgment normal.     BP 140/87   Pulse 98   Temp 98.2 F (36.8 C) (Oral)   Ht 5' 5.5" (1.664 m)   Wt 230 lb 9.6 oz (104.6 kg)   SpO2 97%   BMI 37.79 kg/m  Wt Readings from Last 3 Encounters:  02/16/20 230 lb 9.6 oz (104.6 kg)  11/23/19 225 lb (102.1 kg)  05/24/19 223 lb (101.2 kg)    Health Maintenance Due  Topic Date Due  . FOOT EXAM  12/29/2015  . URINE MICROALBUMIN  12/29/2015  . HEMOGLOBIN A1C  09/23/2018    There are no preventive care reminders to display for this patient.   Lab Results   Component Value Date   TSH 2.78 07/10/2017   Lab Results  Component Value Date   WBC 9.5 04/19/2018   HGB 12.4 04/19/2018   HCT 39.7 04/19/2018   MCV 83.6 04/19/2018   PLT 531 (H) 04/19/2018   Lab Results  Component Value Date   NA 139 04/19/2018   K 4.1 04/19/2018   CO2 26 04/19/2018   GLUCOSE 92 04/19/2018   BUN 12 04/19/2018   CREATININE 0.71 04/19/2018   BILITOT 0.2 03/24/2018   ALKPHOS 79 11/22/2015   AST 14 03/24/2018   ALT 10 03/24/2018   PROT 6.8 03/24/2018   ALBUMIN 4.2 11/22/2015   CALCIUM 9.0 04/19/2018   ANIONGAP 10 04/19/2018   Lab Results  Component Value Date   CHOL 203 (H) 07/10/2017   Lab Results  Component Value Date   HDL 45 (L) 07/10/2017   Lab Results  Component Value Date   LDLCALC 132 (H) 07/10/2017   Lab Results  Component Value Date   TRIG 145 07/10/2017   Lab Results  Component Value Date   CHOLHDL 4.5 07/10/2017   Lab Results  Component Value Date   HGBA1C 6.0 (H) 03/24/2018       Assessment & Plan:   1. Cutaneous abscess, unspecified site Symptoms and presentation consistent with cutaneous abscess to the left medial thigh near the groin crease.  Patient informed of the option of procedure for incision and drainage or a trial with oral doxycycline.  She opted for incision and drainage today. Procedure discussed with patient and verbal authorization provided. Area was cleansed and a circular motion with chlorhexidine.  3 cc of 1% lidocaine with epinephrine utilized to create a peripheral block to the area.  0.5 cm incision to the center of the punctum made.  Purulent and bloody contents drained from the opening. Area was swabbed with sterile cotton swab to ensure removal of all contents. Incision packed with approximately 3 inches of iodoform gauze and dry, sterile dressing placed. Patient instructed to remove 1/2 - 1" of iodoform gauze every 3 days until completely removed.  Prescription for oral doxycycline provided with  instructions to keep area clean and dry. Mupiricin ointment prescription provided for use after the packing has been completely removed. Patient to follow-up if symptoms worsen, fever, chills, nausea, or vomiting present or if area fails to heal.   - doxycycline (VIBRA-TABS) 100 MG tablet; Take 1 tablet (100 mg total) by mouth 2 (two) times daily.  Dispense: 10 tablet; Refill: 0 - mupirocin ointment (BACTROBAN) 2 %; Apply to affected area TID for 7 days once the packing has completely been removed.  Dispense: 30 g; Refill: 3  Return if symptoms worsen or fail to improve.   Orma Render, NP

## 2020-02-16 NOTE — Patient Instructions (Signed)
Pull about 1/2 to 1" of packing every three days until completely removed. Change the bandage at least once a day. Keep the area clean and dry and covered with a bandage or Band-Aid. Start the mupirocin ointment once the packing has been completely removed.   Folliculitis  Folliculitis is inflammation of the hair follicles. Folliculitis most commonly occurs on the scalp, thighs, legs, back, and buttocks. However, it can occur anywhere on the body. What are the causes? This condition may be caused by:  A bacterial infection (common).  A fungal infection.  A viral infection.  Contact with certain chemicals, especially oils and tars.  Shaving or waxing.  Greasy ointments or creams applied to the skin. Long-lasting folliculitis and folliculitis that keeps coming back may be caused by bacteria. This bacteria can live anywhere on your skin and is often found in the nostrils. What increases the risk? You are more likely to develop this condition if you have:  A weakened immune system.  Diabetes.  Obesity. What are the signs or symptoms? Symptoms of this condition include:  Redness.  Soreness.  Swelling.  Itching.  Small white or yellow, pus-filled, itchy spots (pustules) that appear over a reddened area. If there is an infection that goes deep into the follicle, these may develop into a boil (furuncle).  A group of closely packed boils (carbuncle). These tend to form in hairy, sweaty areas of the body. How is this diagnosed? This condition is diagnosed with a skin exam. To find what is causing the condition, your health care provider may take a sample of one of the pustules or boils for testing in a lab. How is this treated? This condition may be treated by:  Applying warm compresses to the affected areas.  Taking an antibiotic medicine or applying an antibiotic medicine to the skin.  Applying or bathing with an antiseptic solution.  Taking an over-the-counter  medicine to help with itching.  Having a procedure to drain any pustules or boils. This may be done if a pustule or boil contains a lot of pus or fluid.  Having laser hair removal. This may be done to treat long-lasting folliculitis. Follow these instructions at home: Managing pain and swelling   If directed, apply heat to the affected area as often as told by your health care provider. Use the heat source that your health care provider recommends, such as a moist heat pack or a heating pad. ? Place a towel between your skin and the heat source. ? Leave the heat on for 20-30 minutes. ? Remove the heat if your skin turns bright red. This is especially important if you are unable to feel pain, heat, or cold. You may have a greater risk of getting burned. General instructions  If you were prescribed an antibiotic medicine, take it or apply it as told by your health care provider. Do not stop using the antibiotic even if your condition improves.  Check the irritated area every day for signs of infection. Check for: ? Redness, swelling, or pain. ? Fluid or blood. ? Warmth. ? Pus or a bad smell.  Do not shave irritated skin.  Take over-the-counter and prescription medicines only as told by your health care provider.  Keep all follow-up visits as told by your health care provider. This is important. Get help right away if:  You have more redness, swelling, or pain in the affected area.  Red streaks are spreading from the affected area.  You have a fever.  Summary  Folliculitis is inflammation of the hair follicles. Folliculitis most commonly occurs on the scalp, thighs, legs, back, and buttocks.  This condition may be treated by taking an antibiotic medicine or applying an antibiotic medicine to the skin, and applying or bathing with an antiseptic solution.  If you were prescribed an antibiotic medicine, take it or apply it as told by your health care provider. Do not stop using the  antibiotic even if your condition improves.  Get help right away if you have new or worsening symptoms.  Keep all follow-up visits as told by your health care provider. This is important. This information is not intended to replace advice given to you by your health care provider. Make sure you discuss any questions you have with your health care provider. Document Revised: 05/15/2018 Document Reviewed: 05/15/2018 Elsevier Patient Education  [place].

## 2020-02-23 ENCOUNTER — Encounter: Payer: Self-pay | Admitting: Nurse Practitioner

## 2020-02-23 ENCOUNTER — Ambulatory Visit: Payer: 59 | Admitting: Nurse Practitioner

## 2020-02-23 ENCOUNTER — Other Ambulatory Visit: Payer: Self-pay

## 2020-02-23 VITALS — BP 138/75 | HR 87 | Temp 98.3°F

## 2020-02-23 DIAGNOSIS — L0291 Cutaneous abscess, unspecified: Secondary | ICD-10-CM | POA: Diagnosis not present

## 2020-02-23 MED ORDER — SULFAMETHOXAZOLE-TRIMETHOPRIM 400-80 MG PO TABS: 2.0000 | ORAL_TABLET | Freq: Two times a day (BID) | ORAL | 0 refills | Status: DC

## 2020-02-23 MED ORDER — AMOXICILLIN-POT CLAVULANATE 875-125 MG PO TABS: 1.0000 | ORAL_TABLET | Freq: Two times a day (BID) | ORAL | 0 refills | Status: DC

## 2020-02-23 MED FILL — AMOX-CLAV 875-125 MG TABLET: 875-125 | 5 days supply | Qty: 10 | Fill #0

## 2020-02-23 MED FILL — SULFAMETHOXAZOLE-TMP DS TAB: 800-160 | 5 days supply | Qty: 20 | Fill #0

## 2020-02-23 NOTE — Patient Instructions (Signed)
Use dial soap to clean the area. You may use the hibicleanse solution to clean around the open wound and over the other wound and along the skin surrounding (do not use on your labia or vagina).

## 2020-02-23 NOTE — Progress Notes (Signed)
Acute Office Visit  Subjective:    Patient ID: Shannon [name], female    DOB: [birth year], 54 y.o.   MRN: [id number]  Chief Complaint  Patient presents with  . Abscess    HPI Patient is in today for evaluation of abscess on her left perineal region. An I&D of the area was performed 02/16/2020 and the area was packed with iodoform gauze and left open to continue to drain and heal. Shannon [name] has been removing the iodoform gauze from the area at a rate of 1" every three days. She reports the area is still tender and malodorous, but denies any purulent drainage. She does have another small abscess now forming posteriorly to the original one near the vaginal introitus. It is not currently draining and she reports it is not as painful. She has completed one round of doxycycline.   Past Medical History:  Diagnosis Date  . Anxiety   . Arthritis    back and hips   . Depression   . GERD (gastroesophageal reflux disease)   . HPV (human papilloma virus) infection   . Hyperlipidemia   . Lateral meniscus tear    left knee  . PONV (postoperative nausea and vomiting)    slow to wake up   . Pre-diabetes   . Shortness of breath dyspnea    with exertion   . Sleep apnea    cpap -0 setting at 16, uses CPAP nightly  . Stress incontinence     Past Surgical History:  Procedure Laterality Date  . APPENDECTOMY    . BREATH TEK H PYLORI N/A 04/03/2015   Procedure: BREATH TEK H PYLORI;  Surgeon: Greer Pickerel, MD;  Location: [facility];  Service: General;  Laterality: N/A;  . CESAREAN SECTION    . 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   second interspace, left foot  . feet surgery    . KNEE ARTHROSCOPY Right 03/17/2019   Procedure: RIGHT KNEE ARTHROSCOPY WITH PARTIAL LATERAL MENISCECTOMY, REMOVAL OF LOOSE BODIES, 3 COMPARTMENT CHONDROPLASTY;  Surgeon: Mcarthur Rossetti, MD;  Location: [place] [facility];  Service: Orthopedics;   Laterality: Right;  . LAPAROSCOPIC GASTRIC SLEEVE RESECTION WITH HIATAL HERNIA REPAIR N/A 08/13/2015   Procedure: LAPAROSCOPIC GASTRIC SLEEVE RESECTION WITH HIATAL HERNIA REPAIR;  Surgeon: Greer Pickerel, MD;  Location: [facility];  Service: General;  Laterality: N/A;  . laproscopy    . UPPER GI ENDOSCOPY  08/13/2015   Procedure: UPPER GI ENDOSCOPY;  Surgeon: Greer Pickerel, MD;  Location: [facility];  Service: General;;  . WISDOM TOOTH EXTRACTION      Family History  Problem Relation Age of Onset  . Hyperlipidemia Mother   . Sleep apnea Mother   . Depression Mother   . Diabetes Father   . Hypertension Father   . COPD Father   . Diabetes Paternal Aunt   . Diabetes Paternal Uncle   . Alcohol abuse Paternal Uncle   . Alcohol abuse Paternal Grandfather   . Depression Sister     Social History   Socioeconomic History  . Marital status: Divorced    Spouse name: Not on file  . Number of children: Not on file  . Years of education: Not on file  . Highest education level: Not on file  Occupational History  . Not on file  Tobacco Use  . Smoking status: Never Smoker  . Smokeless tobacco: Never Used  Substance and Sexual Activity  .  Alcohol use: No    Alcohol/week: 0.0 standard drinks  . Drug use: No  . Sexual activity: Yes    Birth control/protection: Condom, I.U.D.    Comment: IUD inserted 12/12/16  Other Topics Concern  . Not on file  Social History Narrative  . Not on file   Social Determinants of Health   Financial Resource Strain:   . Difficulty of Paying Living Expenses:   Food Insecurity:   . Worried About Charity fundraiser in the Last Year:   . Arboriculturist in the Last Year:   Transportation Needs:   . Film/video editor (Medical):   Marland Kitchen Lack of Transportation (Non-Medical):   Physical Activity:   . Days of Exercise per Week:   . Minutes of Exercise per Session:   Stress:   . Feeling of Stress :   Social Connections:   . Frequency of Communication with Friends  and Family:   . Frequency of Social Gatherings with Friends and Family:   . Attends Religious Services:   . Active Member of Clubs or Organizations:   . Attends Archivist Meetings:   Marland Kitchen Marital Status:   Intimate Partner Violence:   . Fear of Current or Ex-Partner:   . Emotionally Abused:   Marland Kitchen Physically Abused:   . Sexually Abused:     Outpatient Medications Prior to Visit  Medication Sig Dispense Refill  . ALPRAZolam (XANAX) 0.5 MG tablet Take 1 tablet (0.5 mg total) by mouth daily as needed. for anxiety 30 tablet 0  . citalopram (CELEXA) 40 MG tablet Take 1 tablet (40 mg total) by mouth daily. 30 tablet 2  . lamoTRIgine (LAMICTAL) 200 MG tablet Take 1 tablet (200 mg total) by mouth daily. 30 tablet 2  . meclizine (ANTIVERT) 50 MG tablet Take 1 tablet (50 mg total) by mouth 3 (three) times daily as needed for dizziness or nausea. 30 tablet 0  . pregabalin (LYRICA) 100 MG capsule TAKE 1 CAPSULE (100 MG TOTAL) BY MOUTH 2 (TWO) TIMES DAILY. 60 capsule 3  . solifenacin (VESICARE) 10 MG tablet Take 1 tablet (10 mg total) by mouth daily. 90 tablet 3  . Vilazodone HCl (VIIBRYD) 10 MG TABS TAKE 1 TABLET (10 MG TOTAL) BY MOUTH DAILY. 30 tablet 2  . mupirocin ointment (BACTROBAN) 2 % Apply to affected area TID for 7 days once the packing has completely been removed. (Patient not taking: Reported on 02/23/2020) 30 g 3  . doxycycline (VIBRA-TABS) 100 MG tablet Take 1 tablet (100 mg total) by mouth 2 (two) times daily. 10 tablet 0   No facility-administered medications prior to visit.    Allergies  Allergen Reactions  . Food Swelling    Big Red Gum makes her tongue swell  . Latuda [Lurasidone Hcl] Other (See Comments)    Severe aggitation  . Lipitor [Atorvastatin] Other (See Comments)    Muscle aches  . Belviq [Lorcaserin Hcl]     Increased appetitie  . Contrave [Naltrexone-Bupropion Hcl Er] Other (See Comments)    Sleepy.    Review of Systems  Constitutional: Negative for  chills, fatigue and fever.  Cardiovascular: Negative for leg swelling.  Genitourinary: Negative for dysuria, urgency, vaginal discharge and vaginal pain.  Skin: Positive for color change and wound. Negative for rash.       Objective:    Physical Exam Vitals and nursing note reviewed.  Constitutional:      Appearance: Normal appearance.  HENT:  Head: Normocephalic.  Eyes:     Extraocular Movements: Extraocular movements intact.     Conjunctiva/sclera: Conjunctivae normal.     Pupils: Pupils are equal, round, and reactive to light.  Cardiovascular:     Rate and Rhythm: Normal rate.     Pulses: Normal pulses.  Pulmonary:     Effort: Pulmonary effort is normal.  Musculoskeletal:     Cervical back: Normal range of motion.  Skin:    General: Skin is warm and dry.     Capillary Refill: Capillary refill takes less than 2 seconds.     Findings: Abscess present.     Comments: Original abscessed area healing well. Slight erythema present at the edges of the incision with only bloody drainage present. Approximately 2" of packing removed. No purulent discharge or strong odor noted. Wound cultured.   Approximately 2" posteriorly to the original abscess, near the vaginal introitus, a mildly elevated area with minimal erythema and  no obvious punctum present. No odor or drainage noted. Skin intact.   Neurological:     General: No focal deficit present.     Mental Status: She is alert and oriented to person, place, and time.  Psychiatric:        Mood and Affect: Mood normal.        Behavior: Behavior normal.        Thought Content: Thought content normal.        Judgment: Judgment normal.     BP 138/75   Pulse 87   Temp 98.3 F (36.8 C) (Oral)   SpO2 97%  Wt Readings from Last 3 Encounters:  02/16/20 230 lb 9.6 oz (104.6 kg)  11/23/19 225 lb (102.1 kg)  05/24/19 223 lb (101.2 kg)    Health Maintenance Due  Topic Date Due  . OPHTHALMOLOGY EXAM  02/07/2015  . FOOT EXAM   12/29/2015  . URINE MICROALBUMIN  12/29/2015  . HEMOGLOBIN A1C  09/23/2018  . PAP SMEAR-Modifier  11/05/2019    There are no preventive care reminders to display for this patient.   Lab Results  Component Value Date   TSH 2.78 07/10/2017   Lab Results  Component Value Date   WBC 9.5 04/19/2018   HGB 12.4 04/19/2018   HCT 39.7 04/19/2018   MCV 83.6 04/19/2018   PLT 531 (H) 04/19/2018   Lab Results  Component Value Date   NA 139 04/19/2018   K 4.1 04/19/2018   CO2 26 04/19/2018   GLUCOSE 92 04/19/2018   BUN 12 04/19/2018   CREATININE 0.71 04/19/2018   BILITOT 0.2 03/24/2018   ALKPHOS 79 11/22/2015   AST 14 03/24/2018   ALT 10 03/24/2018   PROT 6.8 03/24/2018   ALBUMIN 4.2 11/22/2015   CALCIUM 9.0 04/19/2018   ANIONGAP 10 04/19/2018   Lab Results  Component Value Date   CHOL 203 (H) 07/10/2017   Lab Results  Component Value Date   HDL 45 (L) 07/10/2017   Lab Results  Component Value Date   LDLCALC 132 (H) 07/10/2017   Lab Results  Component Value Date   TRIG 145 07/10/2017   Lab Results  Component Value Date   CHOLHDL 4.5 07/10/2017   Lab Results  Component Value Date   HGBA1C 6.0 (H) 03/24/2018       Assessment & Plan:   1. Cutaneous abscess, unspecified site Original abscess to the left perineal region appears to be healing well although there is still a small amount of erythema and cellulitis  present to the surrounding area.  Packing was removed and a culture was performed.  A second abscessed area appears to be starting posteriorly near the vaginal introitus however at this time incision and drainage are not necessary. New antibiotic therapy for 5 days of Augmentin and Bactrim prescribed to help eradicate the infection and prevent the recurrence of additional abscess places. Patient instructed to cleanse the area with antibacterial Dial soap and keep the open abscess covered with a bandage until complete healing has taken place.  She may also use  Hibiclens solution to the surrounding tissues making sure to avoid the labia, vagina, and anus.  At this time she may begin using the mupirocin to the cutaneous tissue of the original and the secondary abscessed area. Will notify patient of results of wound culture and if further evaluation or medication management is required. Patient instructed of warning signs that would require emergency medical attention or return to clinic. Follow-up if symptoms worsen or fail to improve. - amoxicillin-clavulanate (AUGMENTIN) 875-125 MG tablet; Take 1 tablet by mouth 2 (two) times daily.  Dispense: 10 tablet; Refill: 0 - sulfamethoxazole-trimethoprim (BACTRIM) 400-80 MG tablet; Take 2 tablets by mouth 2 (two) times daily.  Dispense: 20 tablet; Refill: 0 - Wound culture  Return if symptoms worsen or fail to improve.  Orma Render, NP

## 2020-02-26 LAB — WOUND CULTURE
MICRO NUMBER:: 10448246
SPECIMEN QUALITY:: ADEQUATE

## 2020-02-27 ENCOUNTER — Telehealth (HOSPITAL_COMMUNITY): Payer: Self-pay

## 2020-02-27 MED ORDER — ALPRAZOLAM 0.5 MG PO TABS: 0.5000 mg | ORAL_TABLET | Freq: Every day | ORAL | 0 refills | Status: DC | PRN

## 2020-02-27 NOTE — Telephone Encounter (Signed)
sent 

## 2020-02-27 NOTE — Telephone Encounter (Signed)
Patient needs a refill on Xanax sent to [facility]

## 2020-03-21 MED FILL — SOLIFENACIN SUCCINATE 10 MG: 10 | 90 days supply | Qty: 90 | Fill #1

## 2020-03-26 ENCOUNTER — Telehealth (HOSPITAL_COMMUNITY): Payer: Self-pay | Admitting: Psychiatry

## 2020-03-26 DIAGNOSIS — F331 Major depressive disorder, recurrent, moderate: Secondary | ICD-10-CM

## 2020-03-26 MED ORDER — VIIBRYD 10 MG PO TABS: ORAL_TABLET | ORAL | 2 refills | Status: DC

## 2020-03-26 MED ORDER — CITALOPRAM HYDROBROMIDE 40 MG PO TABS: 40.0000 mg | ORAL_TABLET | Freq: Every day | ORAL | 2 refills | Status: DC

## 2020-03-26 MED ORDER — LAMOTRIGINE 200 MG PO TABS: 200.0000 mg | ORAL_TABLET | Freq: Every day | ORAL | 2 refills | Status: DC

## 2020-03-26 MED ORDER — ALPRAZOLAM 0.5 MG PO TABS: 0.5000 mg | ORAL_TABLET | Freq: Every day | ORAL | 0 refills | Status: DC | PRN

## 2020-03-26 NOTE — Telephone Encounter (Signed)
sent 

## 2020-03-26 NOTE — Telephone Encounter (Signed)
Pt needs refills on xanax, lamictal, vibryd, and celexa [facility]

## 2020-03-27 ENCOUNTER — Other Ambulatory Visit: Payer: Self-pay | Admitting: Sports Medicine

## 2020-03-27 DIAGNOSIS — M7918 Myalgia, other site: Secondary | ICD-10-CM

## 2020-03-28 ENCOUNTER — Other Ambulatory Visit: Payer: Self-pay | Admitting: *Deleted

## 2020-03-28 DIAGNOSIS — M7918 Myalgia, other site: Secondary | ICD-10-CM

## 2020-03-28 MED ORDER — PREGABALIN 100 MG PO CAPS: 100.0000 mg | ORAL_CAPSULE | Freq: Two times a day (BID) | ORAL | 3 refills | Status: DC

## 2020-04-03 ENCOUNTER — Other Ambulatory Visit: Payer: Self-pay

## 2020-04-03 ENCOUNTER — Telehealth (INDEPENDENT_AMBULATORY_CARE_PROVIDER_SITE_OTHER): Payer: 59 | Admitting: Sports Medicine

## 2020-04-03 DIAGNOSIS — M7918 Myalgia, other site: Secondary | ICD-10-CM | POA: Diagnosis not present

## 2020-04-03 NOTE — Progress Notes (Signed)
    Procedures performed today:    None.  Independent interpretation of notes and tests performed by another provider:   None.  Brief History, Exam, Impression, and Recommendations:    Cervical myofascial pain syndrome This is a very pleasant 54 year old female, she has cervical myofascial pain syndrome, MRI was for the most part negative with only the mildest spondylosis at C6-C7. She has Celexa and Viibryd treatment per her psychiatrist. We had added Lyrica 75 mg, this seems to work well, she is okay with the dose, and she is okay taking it twice daily. I need to see her at least once a year but otherwise treatment is status quo.    ___________________________________________ Gwen Her. Dianah Field, M.D., ABFM., CAQSM. Primary Care and [place] Instructor of [place] of [facility]

## 2020-04-03 NOTE — Assessment & Plan Note (Signed)
This is a very pleasant 54 year old female, she has cervical myofascial pain syndrome, MRI was for the most part negative with only the mildest spondylosis at C6-C7. She has Celexa and Viibryd treatment per her psychiatrist. We had added Lyrica 75 mg, this seems to work well, she is okay with the dose, and she is okay taking it twice daily. I need to see her at least once a year but otherwise treatment is status quo.

## 2020-04-05 ENCOUNTER — Encounter (HOSPITAL_COMMUNITY): Payer: Self-pay | Admitting: Psychiatry

## 2020-04-05 ENCOUNTER — Telehealth (INDEPENDENT_AMBULATORY_CARE_PROVIDER_SITE_OTHER): Payer: 59 | Admitting: Psychiatry

## 2020-04-05 DIAGNOSIS — F411 Generalized anxiety disorder: Secondary | ICD-10-CM | POA: Diagnosis not present

## 2020-04-05 DIAGNOSIS — F331 Major depressive disorder, recurrent, moderate: Secondary | ICD-10-CM | POA: Diagnosis not present

## 2020-04-05 NOTE — Progress Notes (Signed)
Patient ID: Shannon [name], female   DOB: 07/04/1966, 54 y.o.   MRN: [id number]  [facility] Follow up visit  CHRYSTAL ZEIMET [id number] 54 y.o.  04/05/2020 10:24 AM  Chief Complaint:  Depression follow up   History of Present Illness:    I connected with Shannon [name] on 04/05/20 at 10:00 AM EDT by telephone and verified that I am speaking with the correct person using two identifiers.   Patient location : home Provider location : home  I discussed the limitations, risks, security and privacy concerns of performing an evaluation and management service by telephone and the availability of in person appointments. I also discussed with the patient that there may be a patient responsible charge related to this service. The patient expressed understanding and agreed to proceed.   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 Managing work  No side effects.   Duration  5 plus years    Modifying factors : son, crafts Past Psychiatric History/Hospitalization(s) Manged for depression and mood symptoms for more then 20 years.  Prior Suicide Attempts: No  Medical History; Past Medical History:  Diagnosis Date  . Anxiety   . Arthritis    back and hips   . Depression   . GERD (gastroesophageal reflux disease)   . HPV (human papilloma virus) infection   . Hyperlipidemia   . Lateral meniscus tear    left knee  . PONV (postoperative nausea and vomiting)    slow to wake up   . Pre-diabetes   . Shortness of breath dyspnea    with exertion   . Sleep apnea    cpap -0 setting at 16, uses CPAP nightly  . Stress incontinence     Allergies: Allergies  Allergen Reactions  . Food Swelling    Big Red Gum makes her tongue swell  . Latuda [Lurasidone Hcl] Other (See Comments)    Severe aggitation  . Lipitor [Atorvastatin] Other (See Comments)     Muscle aches  . Belviq [Lorcaserin Hcl]     Increased appetitie  . Contrave [Naltrexone-Bupropion Hcl Er] Other (See Comments)    Sleepy.    Medications: Outpatient Encounter Medications as of 04/05/2020  Medication Sig  . ALPRAZolam (XANAX) 0.5 MG tablet Take 1 tablet (0.5 mg total) by mouth daily as needed. for anxiety  . amoxicillin-clavulanate (AUGMENTIN) 875-125 MG tablet Take 1 tablet by mouth 2 (two) times daily.  . citalopram (CELEXA) 40 MG tablet Take 1 tablet (40 mg total) by mouth daily.  Marland Kitchen lamoTRIgine (LAMICTAL) 200 MG tablet Take 1 tablet (200 mg total) by mouth daily.  . meclizine (ANTIVERT) 50 MG tablet Take 1 tablet (50 mg total) by mouth 3 (three) times daily as needed for dizziness or nausea.  . mupirocin ointment (BACTROBAN) 2 % Apply to affected area TID for 7 days once the packing has completely been removed. (Patient not taking: Reported on 02/23/2020)  . pregabalin (LYRICA) 100 MG capsule Take 1 capsule (100 mg total) by mouth 2 (two) times daily.  . solifenacin (VESICARE) 10 MG tablet Take 1 tablet (10 mg total) by mouth daily.  Marland Kitchen sulfamethoxazole-trimethoprim (BACTRIM) 400-80 MG tablet Take 2 tablets by mouth 2 (two) times daily.  . Vilazodone HCl (VIIBRYD) 10 MG TABS TAKE 1 TABLET (10 MG TOTAL) BY MOUTH DAILY.   No facility-administered encounter  medications on file as of 04/05/2020.     Family History; Family History  Problem Relation Age of Onset  . Hyperlipidemia Mother   . Sleep apnea Mother   . Depression Mother   . Diabetes Father   . Hypertension Father   . COPD Father   . Diabetes Paternal Aunt   . Diabetes Paternal Uncle   . Alcohol abuse Paternal Uncle   . Alcohol abuse Paternal Grandfather   . Depression Sister       Labs:  Recent Results (from the past 2160 hour(s))  Wound culture     Status: Abnormal   Collection Time: 02/23/20 11:31 AM   Specimen: Abscess; Wound  Result Value Ref Range   MICRO NUMBER: [id number]    SPECIMEN QUALITY:  Adequate    SOURCE: NOT GIVEN    STATUS: FINAL    GRAM STAIN: Gram positive cocci in chains     Comment: No white blood cells seen No epithelial cells seen Moderate Gram positive cocci in chains Few Gram positive bacilli   ISOLATE 1: Streptococcus agalactiae (A)     Comment: Heavy growth of Group B Streptococcus isolated Beta-hemolytic streptococci are predictably susceptible to Penicillin and other beta-lactams. Susceptibility testing not routinely performed. Please contact the laboratory within 3 days if susceptibility  testing is desired.         Mental Status Examination;   Psychiatric Specialty Exam: Physical Exam  Review of Systems  Cardiovascular: Negative for palpitations.  Skin: Negative for itching.  Psychiatric/Behavioral: Negative for hallucinations.    There were no vitals taken for this visit.There is no height or weight on file to calculate BMI.  General Appearance:  Eye Contact::    Speech:  Normal Rate  Volume:  Normal  Mood : fair  Affect:  Congruent   Thought Process:  Coherent  Orientation:  Full (Time, Place, and Person)  Thought Content:  Rumination  Suicidal Thoughts:  No  Homicidal Thoughts:  No  Memory:  Immediate;   Fair Recent;   Fair  Judgement:  Fair  Insight:  Shallow  Psychomotor Activity:  Normal  Concentration:  Fair  Recall:  Fair  Akathisia:  Negative  Handed:  Right  AIMS (if indicated):     Assets:  Communication Skills Desire for Improvement Financial Resources/Insurance Housing  Sleep:        Assessment: Axis I: mood disorder unspecified. Rule out mood disorder secondary to general medical condition. Major depressive disorder recurrent moderate. Rule out panic disorder. Generalized anxiety disorder   Axis III:  Past Medical History:  Diagnosis Date  . Anxiety   . Arthritis    back and hips   . Depression   . GERD (gastroesophageal reflux disease)   . HPV (human papilloma virus) infection   . Hyperlipidemia   .  Lateral meniscus tear    left knee  . PONV (postoperative nausea and vomiting)    slow to wake up   . Pre-diabetes   . Shortness of breath dyspnea    with exertion   . Sleep apnea    cpap -0 setting at 16, uses CPAP nightly  . Stress incontinence     Axis IV: psychosocial   Treatment Plan and Summary:  Depression: fair, gets subdued at times. Provided supportive therapy, continue lamictal, celexa No rash or side effects. Provided grief supportive therapy  Anxiety : fluctuates,  Panic attacks infrequent. Continue celexa, prn xanax I discussed the assessment and treatment plan with the patient. The patient  was provided an opportunity to ask questions and all were answered. The patient agreed with the plan and demonstrated an understanding of the instructions.   The patient was advised to call back or seek an in-person evaluation if the symptoms worsen or if the condition fails to improve as anticipated.  Non face to face time visit: 73min Fu 3-20m.  Merian Capron, MD 04/05/2020

## 2020-04-25 DIAGNOSIS — G4733 Obstructive sleep apnea (adult) (pediatric): Secondary | ICD-10-CM | POA: Diagnosis not present

## 2020-04-25 MED FILL — lamoTRIgine 200 MG TABS: 200 | 30 days supply | Qty: 30 | Fill #1

## 2020-04-25 MED FILL — CITALOPRAM HBR 40 MG TABLET: 40 | 30 days supply | Qty: 30 | Fill #1

## 2020-04-25 MED FILL — VIIBRYD 10 MG TABLET: 10 | 30 days supply | Qty: 30 | Fill #1

## 2020-04-25 MED FILL — PREGABALIN 100 MG CAPS: 100 | 30 days supply | Qty: 60 | Fill #1

## 2020-04-30 ENCOUNTER — Telehealth (HOSPITAL_COMMUNITY): Payer: Self-pay | Admitting: Psychiatry

## 2020-04-30 MED ORDER — ALPRAZOLAM 0.5 MG PO TABS: 0.5000 mg | ORAL_TABLET | Freq: Every day | ORAL | 0 refills | Status: DC | PRN

## 2020-04-30 MED FILL — ALPRAZolam 0.5 MG TABS: 0.5 | 30 days supply | Qty: 30 | Fill #0

## 2020-04-30 NOTE — Telephone Encounter (Signed)
Pt needs refill on xanax [facility]  CB [phone]

## 2020-04-30 NOTE — Telephone Encounter (Signed)
sent 

## 2020-05-09 ENCOUNTER — Encounter: Payer: Self-pay | Admitting: Nurse Practitioner

## 2020-05-10 ENCOUNTER — Encounter: Payer: 59 | Admitting: Obstetrics and Gynecology

## 2020-05-31 ENCOUNTER — Other Ambulatory Visit (HOSPITAL_COMMUNITY): Payer: Self-pay | Admitting: Psychiatry

## 2020-05-31 ENCOUNTER — Telehealth (HOSPITAL_COMMUNITY): Payer: Self-pay

## 2020-05-31 MED ORDER — ALPRAZOLAM 0.5 MG PO TABS: 0.5000 mg | ORAL_TABLET | Freq: Every day | ORAL | 0 refills | Status: DC | PRN

## 2020-05-31 MED FILL — lamoTRIgine 200 MG TABS: 200 | 30 days supply | Qty: 30 | Fill #2

## 2020-05-31 MED FILL — CITALOPRAM HBR 40 MG TABLET: 40 | 30 days supply | Qty: 30 | Fill #2

## 2020-05-31 MED FILL — PREGABALIN 100 MG CAPS: 100 | 30 days supply | Qty: 60 | Fill #2

## 2020-05-31 MED FILL — ALPRAZolam 0.5 MG TABS: 0.5 | 30 days supply | Qty: 30 | Fill #0

## 2020-05-31 MED FILL — VIIBRYD 10 MG TABLET: 10 | 30 days supply | Qty: 30 | Fill #2

## 2020-05-31 NOTE — Telephone Encounter (Signed)
This is a Dr. De Nurse patient that needs a refill on Xanax sent to [facility]

## 2020-05-31 NOTE — Telephone Encounter (Signed)
Left a vm informing patient of refill sent

## 2020-05-31 NOTE — Telephone Encounter (Signed)
sent 

## 2020-06-01 DIAGNOSIS — G4733 Obstructive sleep apnea (adult) (pediatric): Secondary | ICD-10-CM | POA: Diagnosis not present

## 2020-06-29 ENCOUNTER — Telehealth (HOSPITAL_COMMUNITY): Payer: Self-pay

## 2020-06-29 DIAGNOSIS — F331 Major depressive disorder, recurrent, moderate: Secondary | ICD-10-CM

## 2020-06-29 MED ORDER — CITALOPRAM HYDROBROMIDE 40 MG PO TABS: 40.0000 mg | ORAL_TABLET | Freq: Every day | ORAL | 1 refills | Status: DC

## 2020-06-29 MED ORDER — VIIBRYD 10 MG PO TABS
ORAL_TABLET | ORAL | 2 refills | Status: DC
Start: 2020-06-29 — End: 2020-08-07

## 2020-06-29 MED ORDER — ALPRAZOLAM 0.5 MG PO TABS
0.5000 mg | ORAL_TABLET | Freq: Every day | ORAL | 0 refills | Status: DC | PRN
Start: 2020-06-29 — End: 2020-08-07

## 2020-06-29 MED ORDER — LAMOTRIGINE 200 MG PO TABS
200.0000 mg | ORAL_TABLET | Freq: Every day | ORAL | 1 refills | Status: DC
Start: 2020-06-29 — End: 2020-08-07

## 2020-06-29 MED FILL — PREGABALIN 100 MG CAPS: 100 | 30 days supply | Qty: 60 | Fill #3

## 2020-06-29 MED FILL — SOLIFENACIN SUCCINATE 10 MG: 10 | 90 days supply | Qty: 90 | Fill #2

## 2020-06-29 MED FILL — ALPRAZolam 0.5 MG TABS: 0.5 | 30 days supply | Qty: 30 | Fill #0

## 2020-06-29 MED FILL — CITALOPRAM HBR 40 MG TABLET: 40 | 30 days supply | Qty: 30 | Fill #0

## 2020-06-29 MED FILL — lamoTRIgine 200 MG TABS: 200 | 30 days supply | Qty: 30 | Fill #0

## 2020-06-29 MED FILL — VIIBRYD 10 MG TABLET: 10 | 30 days supply | Qty: 30 | Fill #0

## 2020-06-29 NOTE — Telephone Encounter (Signed)
Patient needs refills on all medications sent to [name]

## 2020-06-29 NOTE — Telephone Encounter (Signed)
sent 

## 2020-07-10 ENCOUNTER — Telehealth: Payer: Self-pay | Admitting: Neurology

## 2020-07-10 NOTE — Telephone Encounter (Signed)
Patient left vm stating the Vesicare is no longer working. She is wondering if she can increase the dosage. She is currently taking 10 mg daily. Please advise. Last seen for this 11/23/2019.

## 2020-07-10 NOTE — Telephone Encounter (Signed)
Spoke with patient and she wants to discuss alternative medication. She will make an appointment for next week, currently on vacation.

## 2020-07-10 NOTE — Telephone Encounter (Signed)
That's the max dose of vesicare. We could try something else medication wise or you could start some pelvic floor physical therapy!

## 2020-07-22 ENCOUNTER — Other Ambulatory Visit (HOSPITAL_BASED_OUTPATIENT_CLINIC_OR_DEPARTMENT_OTHER): Payer: Self-pay | Admitting: Emergency Medicine

## 2020-07-22 DIAGNOSIS — R509 Fever, unspecified: Secondary | ICD-10-CM | POA: Diagnosis not present

## 2020-07-22 DIAGNOSIS — R000 Tachycardia, unspecified: Secondary | ICD-10-CM | POA: Diagnosis not present

## 2020-07-22 DIAGNOSIS — R079 Chest pain, unspecified: Secondary | ICD-10-CM | POA: Diagnosis not present

## 2020-07-22 DIAGNOSIS — M546 Pain in thoracic spine: Secondary | ICD-10-CM | POA: Diagnosis not present

## 2020-07-22 DIAGNOSIS — Z888 Allergy status to other drugs, medicaments and biological substances status: Secondary | ICD-10-CM | POA: Diagnosis not present

## 2020-07-22 DIAGNOSIS — M62830 Muscle spasm of back: Secondary | ICD-10-CM | POA: Diagnosis not present

## 2020-07-22 DIAGNOSIS — Z79899 Other long term (current) drug therapy: Secondary | ICD-10-CM | POA: Diagnosis not present

## 2020-07-22 DIAGNOSIS — R0602 Shortness of breath: Secondary | ICD-10-CM | POA: Diagnosis not present

## 2020-07-22 DIAGNOSIS — R0789 Other chest pain: Secondary | ICD-10-CM | POA: Diagnosis not present

## 2020-07-22 DIAGNOSIS — R0682 Tachypnea, not elsewhere classified: Secondary | ICD-10-CM | POA: Diagnosis not present

## 2020-07-22 DIAGNOSIS — R069 Unspecified abnormalities of breathing: Secondary | ICD-10-CM | POA: Diagnosis not present

## 2020-07-22 DIAGNOSIS — Z20822 Contact with and (suspected) exposure to covid-19: Secondary | ICD-10-CM | POA: Diagnosis not present

## 2020-07-22 DIAGNOSIS — K449 Diaphragmatic hernia without obstruction or gangrene: Secondary | ICD-10-CM | POA: Diagnosis not present

## 2020-07-22 DIAGNOSIS — R0689 Other abnormalities of breathing: Secondary | ICD-10-CM | POA: Diagnosis not present

## 2020-07-23 MED FILL — CYCLOBENZAPRINE HCL 10 MG T: 10 | 10 days supply | Qty: 20 | Fill #0

## 2020-07-25 ENCOUNTER — Telehealth (INDEPENDENT_AMBULATORY_CARE_PROVIDER_SITE_OTHER): Payer: 59 | Admitting: Medical-Surgical

## 2020-07-25 ENCOUNTER — Encounter: Payer: Self-pay | Admitting: Medical-Surgical

## 2020-07-25 ENCOUNTER — Other Ambulatory Visit: Payer: Self-pay

## 2020-07-25 ENCOUNTER — Ambulatory Visit (INDEPENDENT_AMBULATORY_CARE_PROVIDER_SITE_OTHER): Payer: 59

## 2020-07-25 DIAGNOSIS — M546 Pain in thoracic spine: Secondary | ICD-10-CM

## 2020-07-25 DIAGNOSIS — D72820 Lymphocytosis (symptomatic): Secondary | ICD-10-CM

## 2020-07-25 NOTE — Progress Notes (Signed)
Virtual Visit via Video Note  I connected with Shannon [name] on 07/25/20 at 11:10 AM EDT by a video enabled telemedicine application and verified that I am speaking with the correct person using two identifiers.   I discussed the limitations of evaluation and management by telemedicine and the availability of in person appointments. The patient expressed understanding and agreed to proceed.  Patient location: home Provider locations: office  Subjective:    CC: Back pain, fevers, fatigue  HPI: Pleasant 54 year old female presenting via MyChart video visit with reports of 4 days of mild chest pain, weakness, back spasms and nausea.  Notes that she got off work on Sunday morning and was on her way home when she experienced severe mid back pain.  The pain was so bad that she began to hyperventilate and went into a full panic attack.  She pulled off on side of the road and called 911.  EMS took her to the hospital here in [place] where she was found to have a fever of 101.6.  At that time she was negative for flu, Covid, and RSV.  She had blood work drawn and was told that it was fine.  On review of the blood work, she did have a positive D-dimer of 0.62, elevated white blood cells, and mild anemia.  White blood cells at 16.2 with neutrophils 88.9%, lymphocytes 4.6% ANC of 14.43, and ALC of 0.8.  CMP unremarkable.  Cardiac troponins all negative.  Chest x-Sparrow negative and no PE identified on CTA.  Patient was discharged with Flexeril for back spasms.  She does continue to experience intermittent mid back spasms, reports the Flexeril does help.  She has had a continuous headache since Sunday morning that has not resolved despite ibuprofen.  She continues to have mild upper chest pain, reports that it was tender to touch but is not now.  Her nausea has since resolved but she continues to feel more fatigued and weak than usual.  Notes that she has been having evening fevers for the last couple of weeks but  had attributed these to hot flashes related to menopause.  No upper respiratory symptoms, cough, chest congestion, vomiting/diarrhea, or urinary symptoms.  She does have some dyspnea on exertion intermittently that resolves quickly with rest.  Notes that ibuprofen has been helping with her fevers.  Past medical history, Surgical history, Family history not pertinant except as noted below, Social history, Allergies, and medications have been entered into the medical record, reviewed, and corrections made.   Review of Systems: See HPI for pertinent positives and negatives.   Objective:    General: Speaking clearly in complete sentences without any shortness of breath.  Alert and oriented x3.  Normal judgment. No apparent acute distress.  Impression and Recommendations:    1. Acute thoracic back pain, unspecified back pain laterality Okay to continue Flexeril as needed for back spasms.  May use Tylenol/ibuprofen as needed.  In the setting of fevers, elevated white blood cell count, and thoracic back pain there is some concern for possible spinal abscess/discitis although her pain is now intermittent.  We will go ahead and get thoracic spine x-rays today.  2. Lymphocytosis Unclear etiology.  Rechecking CBC with differential and peripheral smear.    Return if symptoms worsen or fail to improve.  Further follow-up pending results of imaging and labs.  20 minutes of non face-to-face time was provided during this encounter.  I discussed the assessment and treatment plan with the patient. The patient  was provided an opportunity to ask questions and all were answered. The patient agreed with the plan and demonstrated an understanding of the instructions.   The patient was advised to call back or seek an in-person evaluation if the symptoms worsen or if the condition fails to improve as anticipated.  Clearnce Sorrel, DNP, APRN, FNP-BC [facility]

## 2020-07-26 LAB — CBC (INCLUDES DIFF/PLT) WITH PATHOLOGIST REVIEW
Absolute Monocytes: 428 cells/uL (ref 200–950)
Basophils Absolute: 57 cells/uL (ref 0–200)
Basophils Relative: 0.9 %
Eosinophils Absolute: 340 cells/uL (ref 15–500)
Eosinophils Relative: 5.4 %
HCT: 37.2 % (ref 35.0–45.0)
Hemoglobin: 11.7 g/dL (ref 11.7–15.5)
Lymphs Abs: 2022 cells/uL (ref 850–3900)
MCH: 25.9 pg — ABNORMAL LOW (ref 27.0–33.0)
MCHC: 31.5 g/dL — ABNORMAL LOW (ref 32.0–36.0)
MCV: 82.5 fL (ref 80.0–100.0)
MPV: 9.1 fL (ref 7.5–12.5)
Monocytes Relative: 6.8 %
Neutro Abs: 3452 cells/uL (ref 1500–7800)
Neutrophils Relative %: 54.8 %
Platelets: 427 10*3/uL — ABNORMAL HIGH (ref 140–400)
RBC: 4.51 10*6/uL (ref 3.80–5.10)
RDW: 15.1 % — ABNORMAL HIGH (ref 11.0–15.0)
Total Lymphocyte: 32.1 %
WBC: 6.3 10*3/uL (ref 3.8–10.8)

## 2020-07-27 NOTE — Progress Notes (Signed)
Follow up with me first and if still elevated will consider referral

## 2020-08-03 ENCOUNTER — Telehealth (HOSPITAL_COMMUNITY): Payer: 59 | Admitting: Psychiatry

## 2020-08-07 ENCOUNTER — Encounter (HOSPITAL_COMMUNITY): Payer: Self-pay | Admitting: Psychiatry

## 2020-08-07 ENCOUNTER — Other Ambulatory Visit: Payer: Self-pay | Admitting: Sports Medicine

## 2020-08-07 ENCOUNTER — Other Ambulatory Visit (HOSPITAL_COMMUNITY): Payer: Self-pay | Admitting: Psychiatry

## 2020-08-07 ENCOUNTER — Telehealth (INDEPENDENT_AMBULATORY_CARE_PROVIDER_SITE_OTHER): Payer: 59 | Admitting: Psychiatry

## 2020-08-07 DIAGNOSIS — F331 Major depressive disorder, recurrent, moderate: Secondary | ICD-10-CM | POA: Diagnosis not present

## 2020-08-07 DIAGNOSIS — F411 Generalized anxiety disorder: Secondary | ICD-10-CM

## 2020-08-07 DIAGNOSIS — M7918 Myalgia, other site: Secondary | ICD-10-CM

## 2020-08-07 DIAGNOSIS — F410 Panic disorder [episodic paroxysmal anxiety] without agoraphobia: Secondary | ICD-10-CM

## 2020-08-07 MED ORDER — ALPRAZOLAM 0.5 MG PO TABS
0.5000 mg | ORAL_TABLET | Freq: Every day | ORAL | 0 refills | Status: DC | PRN
Start: 2020-08-07 — End: 2020-09-06

## 2020-08-07 MED ORDER — VIIBRYD 10 MG PO TABS
ORAL_TABLET | ORAL | 1 refills | Status: DC
Start: 2020-08-07 — End: 2020-10-10

## 2020-08-07 MED ORDER — CITALOPRAM HYDROBROMIDE 40 MG PO TABS: 40.0000 mg | ORAL_TABLET | Freq: Every day | ORAL | 1 refills | Status: DC

## 2020-08-07 MED ORDER — LAMOTRIGINE 200 MG PO TABS
200.0000 mg | ORAL_TABLET | Freq: Every day | ORAL | 1 refills | Status: DC
Start: 2020-08-07 — End: 2020-10-10

## 2020-08-07 MED FILL — PREGABALIN 100 MG CAPS: 100 | 30 days supply | Qty: 60 | Fill #0

## 2020-08-07 MED FILL — lamoTRIgine 200 MG TABS: 200 | 30 days supply | Qty: 30 | Fill #0

## 2020-08-07 MED FILL — CITALOPRAM HBR 40 MG TABLET: 40 | 30 days supply | Qty: 30 | Fill #0

## 2020-08-07 MED FILL — VIIBRYD 10 MG TABLET: 10 | 30 days supply | Qty: 30 | Fill #0

## 2020-08-07 MED FILL — ALPRAZolam 0.5 MG TABS: 0.5 | 30 days supply | Qty: 30 | Fill #0

## 2020-08-07 NOTE — Progress Notes (Signed)
Patient ID: Shannon [name], female   DOB: 11-04-1965, 54 y.o.   MRN: [id number]  [facility] Follow up visit  Shannon [name] [id number] 54 y.o.  08/07/2020 8:39 AM  Chief Complaint:  Depression follow up   History of Present Illness:    I connected with Sharolyn D Burkland on 08/07/20 at  8:30 AM EDT by telephone and verified that I am speaking with the correct person using two identifiers.    Patient location : home Provider location : home office  I discussed the limitations, risks, security and privacy concerns of performing an evaluation and management service by telephone and the availability of in person appointments. I also discussed with the patient that there may be a patient responsible charge related to this service. The patient expressed understanding and agreed to proceed.   Had panic attack 2 weeks ago, went to ER. No specific trigger.  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 Managing work  No side effects.   Duration  5 plus years    Modifying factors : son, crafts Past Psychiatric History/Hospitalization(s) Manged for depression and mood symptoms for more then 20 years.  Prior Suicide Attempts: No  Medical History; Past Medical History:  Diagnosis Date   Anxiety    Arthritis    back and hips    Depression    Diabetes mellitus type II, controlled ([place]) 08/15/2014   GERD (gastroesophageal reflux disease)    HPV (human papilloma virus) infection    Hyperlipidemia    Lateral meniscus tear    left knee   PONV (postoperative nausea and vomiting)    slow to wake up    Pre-diabetes    Shortness of breath dyspnea    with exertion    Sleep apnea    cpap -0 setting at 16, uses CPAP nightly   Stress incontinence     Allergies: Allergies  Allergen Reactions   Food Swelling    Big Red Gum makes her tongue swell   Latuda [Lurasidone Hcl]  Other (See Comments)    Severe aggitation   Lipitor [Atorvastatin] Other (See Comments)    Muscle aches   Belviq [Lorcaserin Hcl]     Increased appetitie   Contrave [Naltrexone-Bupropion Hcl Er] Other (See Comments)    Sleepy.    Medications: Outpatient Encounter Medications as of 08/07/2020  Medication Sig   ALPRAZolam (XANAX) 0.5 MG tablet Take 1 tablet (0.5 mg total) by mouth daily as needed. for anxiety   amoxicillin-clavulanate (AUGMENTIN) 875-125 MG tablet Take 1 tablet by mouth 2 (two) times daily. (Patient not taking: Reported on 07/25/2020)   citalopram (CELEXA) 40 MG tablet Take 1 tablet (40 mg total) by mouth daily.   lamoTRIgine (LAMICTAL) 200 MG tablet Take 1 tablet (200 mg total) by mouth daily.   meclizine (ANTIVERT) 50 MG tablet Take 1 tablet (50 mg total) by mouth 3 (three) times daily as needed for dizziness or nausea.   mupirocin ointment (BACTROBAN) 2 % Apply to affected area TID for 7 days once the packing has completely been removed.   pregabalin (LYRICA) 100 MG capsule Take 1 capsule (100 mg total) by mouth 2 (two) times daily.   solifenacin (VESICARE) 10 MG tablet Take 1 tablet (10 mg total) by mouth daily.   sulfamethoxazole-trimethoprim (BACTRIM) 400-80 MG tablet Take 2 tablets by mouth 2 (two) times daily.   Vilazodone HCl (VIIBRYD)  10 MG TABS TAKE 1 TABLET (10 MG TOTAL) BY MOUTH DAILY.   [DISCONTINUED] ALPRAZolam (XANAX) 0.5 MG tablet Take 1 tablet (0.5 mg total) by mouth daily as needed. for anxiety   [DISCONTINUED] citalopram (CELEXA) 40 MG tablet Take 1 tablet (40 mg total) by mouth daily.   [DISCONTINUED] lamoTRIgine (LAMICTAL) 200 MG tablet Take 1 tablet (200 mg total) by mouth daily.   [DISCONTINUED] Vilazodone HCl (VIIBRYD) 10 MG TABS TAKE 1 TABLET (10 MG TOTAL) BY MOUTH DAILY.   No facility-administered encounter medications on file as of 08/07/2020.     Family History; Family History  Problem Relation Age of Onset    Hyperlipidemia Mother    Sleep apnea Mother    Depression Mother    Diabetes Father    Hypertension Father    COPD Father    Diabetes Paternal Aunt    Diabetes Paternal Uncle    Alcohol abuse Paternal Uncle    Alcohol abuse Paternal Grandfather    Depression Sister       Labs:  Recent Results (from the past 2160 hour(s))  CBC (INCLUDES DIFF/PLT) WITH PATHOLOGIST REVIEW     Status: Abnormal   Collection Time: 07/25/20  1:06 PM  Result Value Ref Range   WBC 6.3 3.8 - 10.8 Thousand/uL   RBC 4.51 3.80 - 5.10 Million/uL   Hemoglobin 11.7 11.7 - 15.5 g/dL   HCT 37.2 35 - 45 %   MCV 82.5 80.0 - 100.0 fL   MCH 25.9 (L) 27.0 - 33.0 pg   MCHC 31.5 (L) 32.0 - 36.0 g/dL   RDW 15.1 (H) 11.0 - 15.0 %   Platelets 427 (H) 140 - 400 Thousand/uL   MPV 9.1 7.5 - 12.5 fL   Neutro Abs 3,452 1,500 - 7,800 cells/uL   Lymphs Abs 2,022 850 - 3,900 cells/uL   Absolute Monocytes 428 200 - 950 cells/uL   Eosinophils Absolute 340 15.0 - 500.0 cells/uL   Basophils Absolute 57 0.0 - 200.0 cells/uL   Neutrophils Relative % 54.8 %   Total Lymphocyte 32.1 %   Monocytes Relative 6.8 %   Eosinophils Relative 5.4 %   Basophils Relative 0.9 %   Comment      Comment: Myeloid population consists predominantly of mature segmented neutrophils with mild left shift and reactive changes. Rare atypical lymphs. No immature cells are identified. RBCs appear to be microcytic and hypochromic on smear  review. Suggest evaluation for iron deficiency, if clinically indicated. The overall findings in this smear are consistent with a reactive thrombocytosis. Clinical correlation is recommended. Reviewed by Francis Gaines Rockne Coons, MD  (Electronic Signature on File)      07/26/2020         Mental Status Examination;   Psychiatric Specialty Exam: Physical Exam  Review of Systems  Cardiovascular: Negative for palpitations.  Skin: Negative for itching.  Psychiatric/Behavioral: Negative for  hallucinations.    There were no vitals taken for this visit.There is no height or weight on file to calculate BMI.  General Appearance:  Eye Contact::    Speech:  Normal Rate  Volume:  Normal  Mood :fair  Affect:  Congruent   Thought Process:  Coherent  Orientation:  Full (Time, Place, and Person)  Thought Content:  Rumination  Suicidal Thoughts:  No  Homicidal Thoughts:  No  Memory:  Immediate;   Fair Recent;   Fair  Judgement:  Fair  Insight:  Shallow  Psychomotor Activity:  Normal  Concentration:  Fair  Recall:  Fair  Akathisia:  Negative  Handed:  Right  AIMS (if indicated):     Assets:  Communication Skills Desire for Improvement Financial Resources/Insurance Housing  Sleep:        Assessment: Axis I: mood disorder unspecified. Rule out mood disorder secondary to general medical condition. Major depressive disorder recurrent moderate. Rule out panic disorder. Generalized anxiety disorder   Axis III:  Past Medical History:  Diagnosis Date   Anxiety    Arthritis    back and hips    Depression    Diabetes mellitus type II, controlled ([place]) 08/15/2014   GERD (gastroesophageal reflux disease)    HPV (human papilloma virus) infection    Hyperlipidemia    Lateral meniscus tear    left knee   PONV (postoperative nausea and vomiting)    slow to wake up    Pre-diabetes    Shortness of breath dyspnea    with exertion    Sleep apnea    cpap -0 setting at 16, uses CPAP nightly   Stress incontinence     Axis IV: psychosocial   Treatment Plan and Summary:  Depression: fair, gets subdued at times. Provided supportive therapy, continue lamictal, celexa No rash or side effects. Provided grief supportive therapy  Anxiety : fluctuates,  Panic attacks : sporadic, discussed to consider therapy, carry xanax with her. Continue celexa and add ME time to distract from worries I discussed the assessment and treatment plan with the patient. The patient was  provided an opportunity to ask questions and all were answered. The patient agreed with the plan and demonstrated an understanding of the instructions.   The patient was advised to call back or seek an in-person evaluation if the symptoms worsen or if the condition fails to improve as anticipated.  Non face to face time visit: 56min Fu 2-58m. Refills sent , Merian Capron, MD 08/07/2020

## 2020-08-13 ENCOUNTER — Telehealth (INDEPENDENT_AMBULATORY_CARE_PROVIDER_SITE_OTHER): Payer: 59 | Admitting: Physician Assistant

## 2020-08-13 VITALS — BP 125/76 | Temp 99.6°F | Ht 65.5 in | Wt 230.0 lb

## 2020-08-13 DIAGNOSIS — Z806 Family history of leukemia: Secondary | ICD-10-CM

## 2020-08-13 DIAGNOSIS — D508 Other iron deficiency anemias: Secondary | ICD-10-CM

## 2020-08-13 DIAGNOSIS — Z13220 Encounter for screening for lipoid disorders: Secondary | ICD-10-CM

## 2020-08-13 DIAGNOSIS — R7989 Other specified abnormal findings of blood chemistry: Secondary | ICD-10-CM | POA: Diagnosis not present

## 2020-08-13 DIAGNOSIS — R0602 Shortness of breath: Secondary | ICD-10-CM | POA: Diagnosis not present

## 2020-08-13 DIAGNOSIS — R5383 Other fatigue: Secondary | ICD-10-CM | POA: Insufficient documentation

## 2020-08-13 DIAGNOSIS — R600 Localized edema: Secondary | ICD-10-CM | POA: Diagnosis not present

## 2020-08-13 DIAGNOSIS — R509 Fever, unspecified: Secondary | ICD-10-CM | POA: Diagnosis not present

## 2020-08-13 DIAGNOSIS — Z9884 Bariatric surgery status: Secondary | ICD-10-CM | POA: Diagnosis not present

## 2020-08-13 NOTE — Progress Notes (Signed)
Fever in the evenings, everyday (low grade) Covid/Flu/RSV negative At hospital two weeks ago  Some fatigue, but no other symptoms (body aches, congestion, sore throat, etc)

## 2020-08-13 NOTE — Progress Notes (Signed)
Patient ID: Shannon [name], female   DOB: 1966/01/22, 54 y.o.   MRN: [id number] .Marland KitchenVirtual Visit via Telephone Note  I connected with Shannon [name] on 08/13/2020 at  9:50 AM EDT by telephone and verified that I am speaking with the correct person using two identifiers.  Location: Patient: home Provider: clinic   I discussed the limitations, risks, security and privacy concerns of performing an evaluation and management service by telephone and the availability of in person appointments. I also discussed with the patient that there may be a patient responsible charge related to this service. The patient expressed understanding and agreed to proceed.   History of Present Illness: Patient is a 54 year old obese female with OSA on CPAP, hypothyroidism, GAD, IDA who presents to the clinic to follow-up on nonspecific fevers and fatigue.  Patient noticed fever starting in the evening about 2 months ago.  Most the time there 99.6 or 100.  There have been times where his fever has spiked to over 101.  This happened on 07/22/2020 when she was diagnosed with a back spasm and went to the emergency room but also had a fever of 101.6.  Work-up was done with flu, pneumonia, Covid, strep testing.  Everything came back normal.  She was then seen in follow-up by Almyra Free a nurse practitioner here in the office.  CBC and CMP were ordered.  There was some signs of anemia and elevated platelets.  Patient was started on ferrous sulfate.  She has not noticed any improvement in fevers or energy.  She is using her CPAP every night for sleep apnea.  She has no problems laying flat at night.  She has had some intermittent swelling in her lower feet and ankles.  She does endorse some shortness of breath with exertion but she thought it was due to weight gain.  Patient has not started any new medications.  Patient denies any other sick symptoms.  She denies any cough, congestion, sinus pain, abdominal pain, constipation, diarrhea, dysuria,  melena or hematochezia.  The muscle spasms in her back was evaluated with x-Tupper that did not show any mass or lesion. That seems to be improving.   Patient is concerned due to her family history of an uncle with leukemia.  Pt did have laparoscopic sleeve surgery done a few years ago.  There could be some concern for nutritional imbalance.  .. Active Ambulatory Problems    Diagnosis Date Noted  . Hyperlipidemia 11/22/2008  . Sleep apnea 02/05/2012  . Mood disorder ([place]) 02/05/2012  . OSA on CPAP 03/31/2014  . Abnormal weight gain 03/31/2014  . Class 2 obesity due to excess calories without serious comorbidity with body mass index (BMI) of 36.0 to 36.9 in adult 03/31/2014  . Hypothyroidism 08/15/2014  . DDD (degenerative disc disease), lumbar 08/28/2014  . Generalized anxiety disorder 03/16/2015  . Hiatal hernia 08/13/2015  . 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 Cervical myofascial pain syndrome 07/31/2016  . Fibroids 10/30/2016  . 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 Iron deficiency anemia secondary to inadequate dietary iron intake 03/25/2018  . Chest pain 05/09/2018  . BPPV (benign paroxysmal positional vertigo), right 05/09/2018  . Polyp of colon 05/21/2018  . Primary osteoarthritis of right knee 10/06/2018  . Acute lateral meniscus tear of right knee 03/17/2019  . Loss of balance 05/24/2019  . Vertigo 05/24/2019  . OAB (overactive bladder) 11/23/2019  . Fever 08/13/2020  . Fatigue 08/13/2020   Resolved Ambulatory Problems    Diagnosis  Date Noted  . Unspecified otitis media 11/22/2008  . Acute sinusitis, unspecified 11/22/2008  . URI 09/28/2009  . BACK PAIN, LUMBAR 12/26/2008  . Diabetes mellitus type II, controlled ([place]) 08/15/2014  . Burning sensation of the foot 08/28/2014  . Numbness of both lower extremities 08/28/2014  . BMI 38.0-38.9,adult 12/29/2014  . Status post gastric surgery 11/12/2015   Past Medical History:  Diagnosis Date  . Anxiety   .  Arthritis   . Depression   . GERD (gastroesophageal reflux disease)   . HPV (human papilloma virus) infection   . Lateral meniscus tear   . PONV (postoperative nausea and vomiting)   . Pre-diabetes   . Shortness of breath dyspnea   . Stress incontinence    Reviewed meds, problem, allergy list.    Observations/Objective: No acute distress Normal breathing  .Marland Kitchen Today's Vitals   08/13/20 0933  BP: 125/76  Temp: 99.6 F (37.6 C)  TempSrc: Oral  Weight: 230 lb (104.3 kg)  Height: 5' 5.5" (1.664 m)   Body mass index is 37.69 kg/m.     Assessment and Plan: Marland KitchenMarland KitchenAmy was seen today for fever.  Diagnoses and all orders for this visit:  Fever, unspecified fever cause -     CBC w/Diff/Platelet -     B12 and Folate Panel -     TSH -     Pathologist smear review -     COMPLETE METABOLIC PANEL WITH GFR  Iron deficiency anemia secondary to inadequate dietary iron intake -     CBC w/Diff/Platelet -     Fe+TIBC+Fer -     Pathologist smear review -     COMPLETE METABOLIC PANEL WITH GFR  Fatigue, unspecified type -     CBC w/Diff/Platelet -     VITAMIN D 25 Hydroxy (Vit-D Deficiency, Fractures) -     B12 and Folate Panel -     TSH -     Pathologist smear review -     COMPLETE METABOLIC PANEL WITH GFR  Elevated platelet count -     CBC w/Diff/Platelet -     Pathologist smear review -     COMPLETE METABOLIC PANEL WITH GFR  Screening for lipid disorders -     Lipid Panel w/reflex Direct LDL  S/P laparoscopic sleeve gastrectomy -     CBC w/Diff/Platelet -     VITAMIN D 25 Hydroxy (Vit-D Deficiency, Fractures) -     B12 and Folate Panel -     TSH -     Fe+TIBC+Fer -     Lipid Panel w/reflex Direct LDL -     COMPLETE METABOLIC PANEL WITH GFR  Family history of leukemia  SOB (shortness of breath) on exertion  Bilateral lower extremity edema   Unclear etiology of symptoms today.  Certainly with family history of leukemia and intermittent fevers and fatigue would  like to recheck CBC with additional pathology smear.  We will check some other labs that could contribute to fatigue.  Patient does have a history of the gastric sleeve and perhaps there could be some nutritional deficiencies.  Patient did endorse some shortness of breath on exertion as well as some intermittent lower extremity edema.  I would like to get an echo of the heart.  Continue on CPAP.  I do not see any signs of worrisome depression.    Follow Up Instructions:    I discussed the assessment and treatment plan with the patient. The patient was provided an opportunity to  ask questions and all were answered. The patient agreed with the plan and demonstrated an understanding of the instructions.   The patient was advised to call back or seek an in-person evaluation if the symptoms worsen or if the condition fails to improve as anticipated.  I provided 30 minutes of non-face-to-face time during this encounter.   Iran Planas, PA-C

## 2020-08-14 ENCOUNTER — Encounter: Payer: Self-pay | Admitting: Physician Assistant

## 2020-08-14 DIAGNOSIS — R7989 Other specified abnormal findings of blood chemistry: Secondary | ICD-10-CM | POA: Insufficient documentation

## 2020-08-14 DIAGNOSIS — Z806 Family history of leukemia: Secondary | ICD-10-CM | POA: Insufficient documentation

## 2020-08-14 DIAGNOSIS — R0602 Shortness of breath: Secondary | ICD-10-CM | POA: Insufficient documentation

## 2020-08-14 DIAGNOSIS — R600 Localized edema: Secondary | ICD-10-CM | POA: Insufficient documentation

## 2020-08-16 DIAGNOSIS — Z13220 Encounter for screening for lipoid disorders: Secondary | ICD-10-CM | POA: Diagnosis not present

## 2020-08-16 DIAGNOSIS — R7989 Other specified abnormal findings of blood chemistry: Secondary | ICD-10-CM | POA: Diagnosis not present

## 2020-08-16 DIAGNOSIS — D508 Other iron deficiency anemias: Secondary | ICD-10-CM | POA: Diagnosis not present

## 2020-08-16 DIAGNOSIS — Z9884 Bariatric surgery status: Secondary | ICD-10-CM | POA: Diagnosis not present

## 2020-08-16 DIAGNOSIS — R509 Fever, unspecified: Secondary | ICD-10-CM | POA: Diagnosis not present

## 2020-08-16 DIAGNOSIS — R5383 Other fatigue: Secondary | ICD-10-CM | POA: Diagnosis not present

## 2020-08-17 LAB — COMPLETE METABOLIC PANEL WITH GFR
AG Ratio: 1.6 (calc) (ref 1.0–2.5)
ALT: 27 U/L (ref 6–29)
AST: 27 U/L (ref 10–35)
Albumin: 4.2 g/dL (ref 3.6–5.1)
Alkaline phosphatase (APISO): 93 U/L (ref 37–153)
BUN: 10 mg/dL (ref 7–25)
CO2: 28 mmol/L (ref 20–32)
Calcium: 9.2 mg/dL (ref 8.6–10.4)
Chloride: 103 mmol/L (ref 98–110)
Creat: 0.72 mg/dL (ref 0.50–1.05)
GFR, Est African American: 112 mL/min/{1.73_m2} (ref >=60)
GFR, Est Non African American: 96 mL/min/{1.73_m2} (ref >=60)
Globulin: 2.6 g/dL (calc) (ref 1.9–3.7)
Glucose, Bld: 94 mg/dL (ref 65–99)
Potassium: 4.3 mmol/L (ref 3.5–5.3)
Sodium: 141 mmol/L (ref 135–146)
Total Bilirubin: 0.3 mg/dL (ref 0.2–1.2)
Total Protein: 6.8 g/dL (ref 6.1–8.1)

## 2020-08-17 LAB — CBC WITH DIFFERENTIAL/PLATELET
Absolute Monocytes: 350 cells/uL (ref 200–950)
Basophils Absolute: 84 cells/uL (ref 0–200)
Basophils Relative: 1.2 %
Eosinophils Absolute: 385 cells/uL (ref 15–500)
Eosinophils Relative: 5.5 %
HCT: 39.5 % (ref 35.0–45.0)
Hemoglobin: 12.5 g/dL (ref 11.7–15.5)
Lymphs Abs: 2954 cells/uL (ref 850–3900)
MCH: 26.2 pg — ABNORMAL LOW (ref 27.0–33.0)
MCHC: 31.6 g/dL — ABNORMAL LOW (ref 32.0–36.0)
MCV: 82.8 fL (ref 80.0–100.0)
MPV: 8.8 fL (ref 7.5–12.5)
Monocytes Relative: 5 %
Neutro Abs: 3227 cells/uL (ref 1500–7800)
Neutrophils Relative %: 46.1 %
Platelets: 495 10*3/uL — ABNORMAL HIGH (ref 140–400)
RBC: 4.77 10*6/uL (ref 3.80–5.10)
RDW: 15.8 % — ABNORMAL HIGH (ref 11.0–15.0)
Total Lymphocyte: 42.2 %
WBC: 7 10*3/uL (ref 3.8–10.8)

## 2020-08-17 LAB — LIPID PANEL W/REFLEX DIRECT LDL
Cholesterol: 243 mg/dL — ABNORMAL HIGH (ref <200)
HDL: 40 mg/dL — ABNORMAL LOW (ref >=50)
LDL Cholesterol (Calc): 161 mg/dL (calc) — ABNORMAL HIGH
Non-HDL Cholesterol (Calc): 203 mg/dL (calc) — ABNORMAL HIGH (ref <130)
Total CHOL/HDL Ratio: 6.1 (calc) — ABNORMAL HIGH (ref <5.0)
Triglycerides: 259 mg/dL — ABNORMAL HIGH (ref <150)

## 2020-08-17 LAB — B12 AND FOLATE PANEL
Folate: 12.4 ng/mL
Vitamin B-12: 491 pg/mL (ref 200–1100)

## 2020-08-17 LAB — URINE CULTURE
MICRO NUMBER:: 11131109
SPECIMEN QUALITY:: ADEQUATE

## 2020-08-17 LAB — PATHOLOGIST SMEAR REVIEW

## 2020-08-17 LAB — IRON,TIBC AND FERRITIN PANEL
%SAT: 74 % (calc) — ABNORMAL HIGH (ref 16–45)
Ferritin: 22 ng/mL (ref 16–232)
Iron: 324 ug/dL — ABNORMAL HIGH (ref 45–160)
TIBC: 435 mcg/dL (calc) (ref 250–450)

## 2020-08-17 LAB — TSH: TSH: 2.46 mIU/L

## 2020-08-17 LAB — VITAMIN D 25 HYDROXY (VIT D DEFICIENCY, FRACTURES): Vit D, 25-Hydroxy: 17 ng/mL — ABNORMAL LOW (ref 30–100)

## 2020-08-17 MED ORDER — AMOXICILLIN-POT CLAVULANATE 875-125 MG PO TABS: 1.0000 | ORAL_TABLET | Freq: Two times a day (BID) | ORAL | 0 refills | Status: DC

## 2020-08-17 NOTE — Progress Notes (Signed)
Strep found in urine culture. Sent Augmentin to treat. I am glad we cultured it. It was sent to [facility] to pick up this weekend.

## 2020-08-17 NOTE — Addendum Note (Signed)
Addended by: [name] on: 08/17/2020 04:34 PM   Modules accepted: Orders

## 2020-08-28 DIAGNOSIS — G4733 Obstructive sleep apnea (adult) (pediatric): Secondary | ICD-10-CM | POA: Diagnosis not present

## 2020-08-29 ENCOUNTER — Ambulatory Visit: Payer: 59 | Admitting: Orthopaedic Surgery

## 2020-09-06 ENCOUNTER — Other Ambulatory Visit (HOSPITAL_COMMUNITY): Payer: Self-pay | Admitting: Psychiatry

## 2020-09-06 MED FILL — ALPRAZolam 0.5 MG TABS: 0.5 | 30 days supply | Qty: 30 | Fill #0

## 2020-09-06 MED FILL — CITALOPRAM HBR 40 MG TABLET: 40 | 30 days supply | Qty: 30 | Fill #1

## 2020-09-06 MED FILL — VIIBRYD 10 MG TABLET: 10 | 30 days supply | Qty: 30 | Fill #1

## 2020-09-06 MED FILL — PREGABALIN 100 MG CAPS: 100 | 30 days supply | Qty: 60 | Fill #1

## 2020-09-06 MED FILL — lamoTRIgine 200 MG TABS: 200 | 30 days supply | Qty: 30 | Fill #1

## 2020-10-09 ENCOUNTER — Ambulatory Visit: Payer: 59 | Admitting: Sports Medicine

## 2020-10-10 ENCOUNTER — Encounter (HOSPITAL_COMMUNITY): Payer: Self-pay | Admitting: Psychiatry

## 2020-10-10 ENCOUNTER — Other Ambulatory Visit (HOSPITAL_COMMUNITY): Payer: Self-pay | Admitting: Psychiatry

## 2020-10-10 ENCOUNTER — Telehealth (INDEPENDENT_AMBULATORY_CARE_PROVIDER_SITE_OTHER): Payer: 59 | Admitting: Psychiatry

## 2020-10-10 DIAGNOSIS — F331 Major depressive disorder, recurrent, moderate: Secondary | ICD-10-CM | POA: Diagnosis not present

## 2020-10-10 DIAGNOSIS — F411 Generalized anxiety disorder: Secondary | ICD-10-CM

## 2020-10-10 DIAGNOSIS — F410 Panic disorder [episodic paroxysmal anxiety] without agoraphobia: Secondary | ICD-10-CM

## 2020-10-10 MED ORDER — VIIBRYD 10 MG PO TABS
ORAL_TABLET | ORAL | 1 refills | Status: DC
Start: 2020-10-10 — End: 2020-12-11

## 2020-10-10 MED ORDER — CITALOPRAM HYDROBROMIDE 40 MG PO TABS: 40.0000 mg | ORAL_TABLET | Freq: Every day | ORAL | 1 refills | Status: DC

## 2020-10-10 MED ORDER — ALPRAZOLAM 0.5 MG PO TABS
0.5000 mg | ORAL_TABLET | Freq: Every day | ORAL | 0 refills | Status: DC | PRN
Start: 2020-10-10 — End: 2020-11-14

## 2020-10-10 MED ORDER — LAMOTRIGINE 200 MG PO TABS
200.0000 mg | ORAL_TABLET | Freq: Every day | ORAL | 1 refills | Status: DC
Start: 2020-10-10 — End: 2020-12-11

## 2020-10-10 MED FILL — ALPRAZolam 0.5 MG TABS: 0.5 | 30 days supply | Qty: 30 | Fill #0

## 2020-10-10 MED FILL — VIIBRYD 10 MG TABLET: 10 | 30 days supply | Qty: 30 | Fill #0

## 2020-10-10 MED FILL — CITALOPRAM HBR 40 MG TABLET: 40 | 30 days supply | Qty: 30 | Fill #0

## 2020-10-10 MED FILL — lamoTRIgine 200 MG TABS: 200 | 30 days supply | Qty: 30 | Fill #0

## 2020-10-10 NOTE — Progress Notes (Signed)
Patient ID: Shannon [name], female   DOB: [birth year], 54 y.o.   MRN: [id number]  [facility] Follow up visit  Shannon [name] [id number] 54 y.o.  10/10/2020 8:43 AM  Chief Complaint:  Depression follow up   History of Present Illness:    I connected with Stephanye D Register on 10/10/20 at  8:30 AM EST by telephone and verified that I am speaking with the correct person using two identifiers.  Patient location : home Provider location : home office  I discussed the limitations, risks, security and privacy concerns of performing an evaluation and management service by telephone and the availability of in person appointments. I also discussed with the patient that there may be a patient responsible charge related to this service. The patient expressed understanding and agreed to proceed.   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 Managing work  No side effects.   Duration  5 plus years    Modifying factors : craft work, son  Past Psychiatric History/Hospitalization(s) Manged for depression and mood symptoms for more then 20 years.  Prior Suicide Attempts: No  Medical History; Past Medical History:  Diagnosis Date  . Anxiety   . Arthritis    back and hips   . Depression   . Diabetes mellitus type II, controlled ([place]) 08/15/2014  . GERD (gastroesophageal reflux disease)   . HPV (human papilloma virus) infection   . Hyperlipidemia   . Lateral meniscus tear    left knee  . PONV (postoperative nausea and vomiting)    slow to wake up   . Pre-diabetes   . Shortness of breath dyspnea    with exertion   . Sleep apnea    cpap -0 setting at 16, uses CPAP nightly  . Stress incontinence     Allergies: Allergies  Allergen Reactions  . Food Swelling    Big Red Gum makes her tongue  swell  . Latuda [Lurasidone Hcl] Other (See Comments)    Severe aggitation  . Lipitor [Atorvastatin] Other (See Comments)    Muscle aches  . Belviq [Lorcaserin Hcl]     Increased appetitie  . Contrave [Naltrexone-Bupropion Hcl Er] Other (See Comments)    Sleepy.    Medications: Outpatient Encounter Medications as of 10/10/2020  Medication Sig  . ALPRAZolam (XANAX) 0.5 MG tablet Take 1 tablet (0.5 mg total) by mouth daily as needed. for anxiety  . amoxicillin-clavulanate (AUGMENTIN) 875-125 MG tablet Take 1 tablet by mouth 2 (two) times daily.  . citalopram (CELEXA) 40 MG tablet Take 1 tablet (40 mg total) by mouth daily.  . cyclobenzaprine (FLEXERIL) 10 MG tablet Take 10 mg by mouth 3 (three) times daily as needed for muscle spasms.  Marland Kitchen lamoTRIgine (LAMICTAL) 200 MG tablet Take 1 tablet (200 mg total) by mouth daily.  . pregabalin (LYRICA) 100 MG capsule TAKE 1 CAPSULE (100 MG TOTAL) BY MOUTH 2 (TWO) TIMES DAILY.  Marland Kitchen solifenacin (VESICARE) 10 MG tablet Take 1 tablet (10 mg total) by mouth daily.  . Vilazodone HCl (VIIBRYD) 10 MG TABS TAKE 1 TABLET (10 MG TOTAL) BY MOUTH DAILY.  . [DISCONTINUED] ALPRAZolam (XANAX) 0.5 MG tablet TAKE 1 TABLET (0.5 MG TOTAL) BY MOUTH DAILY AS NEEDED FOR ANXIETY  . [DISCONTINUED] citalopram (CELEXA) 40 MG tablet Take 1 tablet (40 mg  total) by mouth daily.  . [DISCONTINUED] lamoTRIgine (LAMICTAL) 200 MG tablet Take 1 tablet (200 mg total) by mouth daily.  . [DISCONTINUED] Vilazodone HCl (VIIBRYD) 10 MG TABS TAKE 1 TABLET (10 MG TOTAL) BY MOUTH DAILY.   No facility-administered encounter medications on file as of 10/10/2020.     Family History; Family History  Problem Relation Age of Onset  . Hyperlipidemia Mother   . Sleep apnea Mother   . Depression Mother   . Diabetes Father   . Hypertension Father   . COPD Father   . Diabetes Paternal Aunt   . Diabetes Paternal Uncle   . Alcohol abuse Paternal Uncle   . Leukemia Paternal Uncle   . Alcohol  abuse Paternal Grandfather   . Depression Sister       Labs:  Recent Results (from the past 2160 hour(s))  CBC (INCLUDES DIFF/PLT) WITH PATHOLOGIST REVIEW     Status: Abnormal   Collection Time: 07/25/20  1:06 PM  Result Value Ref Range   WBC 6.3 3.8 - 10.8 Thousand/uL   RBC 4.51 3.80 - 5.10 Million/uL   Hemoglobin 11.7 11.7 - 15.5 g/dL   HCT 37.2 35.0 - 45.0 %   MCV 82.5 80.0 - 100.0 fL   MCH 25.9 (L) 27.0 - 33.0 pg   MCHC 31.5 (L) 32.0 - 36.0 g/dL   RDW 15.1 (H) 11.0 - 15.0 %   Platelets 427 (H) 140 - 400 Thousand/uL   MPV 9.1 7.5 - 12.5 fL   Neutro Abs 3,452 1,500 - 7,800 cells/uL   Lymphs Abs 2,022 850 - 3,900 cells/uL   Absolute Monocytes 428 200 - 950 cells/uL   Eosinophils Absolute 340 15 - 500 cells/uL   Basophils Absolute 57 0 - 200 cells/uL   Neutrophils Relative % 54.8 %   Total Lymphocyte 32.1 %   Monocytes Relative 6.8 %   Eosinophils Relative 5.4 %   Basophils Relative 0.9 %   Comment      Comment: Myeloid population consists predominantly of mature segmented neutrophils with mild left shift and reactive changes. Rare atypical lymphs. No immature cells are identified. RBCs appear to be microcytic and hypochromic on smear  review. Suggest evaluation for iron deficiency, if clinically indicated. The overall findings in this smear are consistent with a reactive thrombocytosis. Clinical correlation is recommended. Reviewed by Francis Gaines Mammarappallil, MD  (Electronic Signature on File)      07/26/2020   CBC w/Diff/Platelet     Status: Abnormal   Collection Time: 08/16/20  7:14 AM  Result Value Ref Range   WBC 7.0 3.8 - 10.8 Thousand/uL   RBC 4.77 3.80 - 5.10 Million/uL   Hemoglobin 12.5 11.7 - 15.5 g/dL   HCT 39.5 35.0 - 45.0 %   MCV 82.8 80.0 - 100.0 fL   MCH 26.2 (L) 27.0 - 33.0 pg   MCHC 31.6 (L) 32.0 - 36.0 g/dL   RDW 15.8 (H) 11.0 - 15.0 %   Platelets 495 (H) 140 - 400 Thousand/uL   MPV 8.8 7.5 - 12.5 fL   Neutro Abs 3,227 1,500 - 7,800  cells/uL   Lymphs Abs 2,954 850 - 3,900 cells/uL   Absolute Monocytes 350 200 - 950 cells/uL   Eosinophils Absolute 385 15 - 500 cells/uL   Basophils Absolute 84 0 - 200 cells/uL   Neutrophils Relative % 46.1 %   Total Lymphocyte 42.2 %   Monocytes Relative 5.0 %   Eosinophils Relative 5.5 %   Basophils Relative 1.2 %  VITAMIN D 25 Hydroxy (Vit-D Deficiency, Fractures)     Status: Abnormal   Collection Time: 08/16/20  7:14 AM  Result Value Ref Range   Vit D, 25-Hydroxy 17 (L) 30 - 100 ng/mL    Comment: Vitamin D Status         25-OH Vitamin D: . Deficiency:                    <20 ng/mL Insufficiency:             20 - 29 ng/mL Optimal:                 > or = 30 ng/mL . For 25-OH Vitamin D testing on patients on  D2-supplementation and patients for whom quantitation  of D2 and D3 fractions is required, the Quest[facility]ureD(TM) 25-OH VIT D, (D2,D3), LC/MS/MS is recommended: order  code 85885 (patients >56yrs). See Note 1 . Note 1 . For additional information, please refer to  http://education.[facility].com/faq/FAQ199  (This link is being provided for informational/ educational purposes only.)   B12 and Folate Panel     Status: None   Collection Time: 08/16/20  7:14 AM  Result Value Ref Range   Vitamin B-12 491 200 - 1,100 pg/mL   Folate 12.4 ng/mL    Comment:                            Reference Range                            Low:           <3.4                            Borderline:    3.4-5.4                            Normal:        >5.4 .   TSH     Status: None   Collection Time: 08/16/20  7:14 AM  Result Value Ref Range   TSH 2.46 mIU/L    Comment:           Reference Range .           > or = 20 Years  0.40-4.50 .                Pregnancy Ranges           First trimester    0.26-2.66           Second trimester   0.55-2.73           Third trimester    0.43-2.91   Fe+TIBC+Fer     Status: Abnormal   Collection Time: 08/16/20  7:14 AM  Result Value Ref  Range   Iron 324 (H) 45 - 160 mcg/dL   TIBC 027 741 - 287 mcg/dL (calc)   %SAT 74 (H) 16 - 45 % (calc)   Ferritin 22 16 - 232 ng/mL  Pathologist smear review     Status: None   Collection Time: 08/16/20  7:14 AM  Result Value Ref Range   Path Review      Comment: Myeloid population consists predominantly of mature segmented neutrophils with mild reactive changes. No immature cells are identified. Thrombocytosis. RBCs appear  to be microcytic and hypochromic on smear  review. Suggest evaluation for iron deficiency, if clinically indicated. Several tear drop cells also noted. Suggest hematologic evaluation, if clinically indicated. Reviewed by Nehemiah Massed Mammarappallil, MD  (Electronic Signature on File)      08/17/2020   Lipid Panel w/reflex Direct LDL     Status: Abnormal   Collection Time: 08/16/20  7:14 AM  Result Value Ref Range   Cholesterol 243 (H) <200 mg/dL   HDL 40 (L) > OR = 50 mg/dL   Triglycerides 784 (H) <150 mg/dL    Comment: . If a non-fasting specimen was collected, consider repeat triglyceride testing on a fasting specimen if clinically indicated.  Perry Mount et al. J. of Clin. Lipidol. 2015;9:129-169. Marland Kitchen    LDL Cholesterol (Calc) 161 (H) mg/dL (calc)    Comment: Reference range: <100 . Desirable range <100 mg/dL for primary prevention;   <70 mg/dL for patients with CHD or diabetic patients  with > or = 2 CHD risk factors. Marland Kitchen LDL-C is now calculated using the Martin-Hopkins  calculation, which is a validated novel method providing  better accuracy than the Friedewald equation in the  estimation of LDL-C.  Horald Pollen et al. Lenox Ahr. 6962;952(84): 2061-2068  (http://education.[facility].com/faq/FAQ164)    Total CHOL/HDL Ratio 6.1 (H) <5.0 (calc)   Non-HDL Cholesterol (Calc) 203 (H) <130 mg/dL (calc)    Comment: For patients with diabetes plus 1 major ASCVD risk  factor, treating to a non-HDL-C goal of <100 mg/dL  (LDL-C of <13 mg/dL) is considered a  therapeutic  option.   COMPLETE METABOLIC PANEL WITH GFR     Status: None   Collection Time: 08/16/20  7:14 AM  Result Value Ref Range   Glucose, Bld 94 65 - 99 mg/dL    Comment: .            Fasting reference interval .    BUN 10 7 - 25 mg/dL   Creat 2.44 0.10 - 2.72 mg/dL    Comment: For patients >32 years of age, the reference limit for Creatinine is approximately 13% higher for people identified as African-American. .    GFR, Est Non African American 96 > OR = 60 mL/min/1.39m2   GFR, Est African American 112 > OR = 60 mL/min/1.12m2   BUN/Creatinine Ratio NOT APPLICABLE 6 - 22 (calc)   Sodium 141 135 - 146 mmol/L   Potassium 4.3 3.5 - 5.3 mmol/L   Chloride 103 98 - 110 mmol/L   CO2 28 20 - 32 mmol/L   Calcium 9.2 8.6 - 10.4 mg/dL   Total Protein 6.8 6.1 - 8.1 g/dL   Albumin 4.2 3.6 - 5.1 g/dL   Globulin 2.6 1.9 - 3.7 g/dL (calc)   AG Ratio 1.6 1.0 - 2.5 (calc)   Total Bilirubin 0.3 0.2 - 1.2 mg/dL   Alkaline phosphatase (APISO) 93 37 - 153 U/L   AST 27 10 - 35 U/L   ALT 27 6 - 29 U/L  Urine Culture     Status: Abnormal   Collection Time: 08/16/20  7:21 AM   Specimen: Urine  Result Value Ref Range   MICRO NUMBER: [id number]    SPECIMEN QUALITY: Adequate    Sample Source URINE    STATUS: FINAL    ISOLATE 1: Streptococcus agalactiae (A)     Comment: Greater than 100,000 CFU/mL of Group B Streptococcus isolated Beta-hemolytic streptococci are predictably susceptible to Penicillin and other beta-lactams. Susceptibility testing not routinely performed. Please contact the laboratory  within 3 days if  susceptibility testing is desired. Erythromycin and clindamycin are not recommended for treatment of urinary tract infections, but clindamycin may be useful for treatment in penicillin allergic patients for rectovaginal colonization or for intrapartum  prophylaxis if indicated. Any amount of group B Streptococcus in urine specimens obtained from pregnant females is a marker of  genital tract colonization. If this patient is pregnant, please refer to ACOG guidelines for appropriate screening and  management of pregnant women.         Mental Status Examination;   Psychiatric Specialty Exam: Physical Exam  Review of Systems  Cardiovascular: Negative for palpitations.  Skin: Negative for itching.  Psychiatric/Behavioral: Negative for hallucinations.    There were no vitals taken for this visit.There is no height or weight on file to calculate BMI.  General Appearance:  Eye Contact::    Speech:  Normal Rate  Volume:  Normal  Mood : somewhat stressed  Affect:  Congruent   Thought Process:  Coherent  Orientation:  Full (Time, Place, and Person)  Thought Content:  Rumination  Suicidal Thoughts:  No  Homicidal Thoughts:  No  Memory:  Immediate;   Fair Recent;   Fair  Judgement:  Fair  Insight:  Shallow  Psychomotor Activity:  Normal  Concentration:  Fair  Recall:  Fair  Akathisia:  Negative  Handed:  Right  AIMS (if indicated):     Assets:  Communication Skills Desire for Improvement Financial Resources/Insurance Housing  Sleep:        Assessment: Axis I: mood disorder unspecified. Rule out mood disorder secondary to general medical condition. Major depressive disorder recurrent moderate. Rule out panic disorder. Generalized anxiety disorder   Axis III:  Past Medical History:  Diagnosis Date  . Anxiety   . Arthritis    back and hips   . Depression   . Diabetes mellitus type II, controlled ([facility]) 08/15/2014  . GERD (gastroesophageal reflux disease)   . HPV (human papilloma virus) infection   . Hyperlipidemia   . Lateral meniscus tear    left knee  . PONV (postoperative nausea and vomiting)    slow to wake up   . Pre-diabetes   . Shortness of breath dyspnea    with exertion   . Sleep apnea    cpap -0 setting at 16, uses CPAP nightly  . Stress incontinence     Axis IV: psychosocial   Treatment Plan and Summary:  Depression:  somewhat subdued due to job stress, carries xanax takes prn, conntinue lamictal. Doesn't want to change meds, understands to talk with supervisor, says short staff Anxiety : fluctuates , see above   Panic attacks : sporadic, discussed to consider therapy, carry xanax with her. Continue celexa and add ME time to distract from worries I discussed the assessment and treatment plan with the patient. The patient was provided an opportunity to ask questions and all were answered. The patient agreed with the plan and demonstrated an understanding of the instructions.   The patient was advised to call back or seek an in-person evaluation if the symptoms worsen or if the condition fails to improve as anticipated.  Non face to face time visit: Fu 2-m. Refills sent , Thresa Ross, MD 10/10/2020

## 2020-10-15 MED FILL — SOLIFENACIN SUCCINATE 10 MG: 10 | 90 days supply | Qty: 90 | Fill #3

## 2020-10-15 MED FILL — PREGABALIN 100 MG CAPS: 100 | 30 days supply | Qty: 60 | Fill #2

## 2020-10-23 ENCOUNTER — Ambulatory Visit (INDEPENDENT_AMBULATORY_CARE_PROVIDER_SITE_OTHER): Payer: 59 | Admitting: Sports Medicine

## 2020-10-23 ENCOUNTER — Other Ambulatory Visit: Payer: Self-pay

## 2020-10-23 DIAGNOSIS — M67441 Ganglion, right hand: Secondary | ICD-10-CM

## 2020-10-23 NOTE — Progress Notes (Signed)
    Procedures performed today:    Puncture drainage of lesion/mucinous cyst right thumb Risks, benefits, and alternatives explained and consent obtained. Time out conducted. Surface cleaned with chlorhexidine. Topical cooling spray used for anesthesia.   Using 18-gauge needle I made a small stab incision and then expressed all of the gelatinous fluid. Hemostasis achieved. Pt stable. Aftercare and follow-up advised.  Independent interpretation of notes and tests performed by another provider:   None.  Brief History, Exam, Impression, and Recommendations:    Digital mucinous cyst of right thumb Small minimally symptomatic mucinous cyst of the right thumb just proximal to the proximal nail fold, distal to the interphalangeal joint of the thumb. We made a small incision with an 18-gauge needle, expressed the fluid, dressed it, she will return to see me in a month, she understands the recurrence rate.    ___________________________________________ Ihor Austin. Benjamin Stain, M.D., ABFM., CAQSM. Primary Care and Sports Medicine [facility]  Adjunct Instructor of Family Medicine  [employer]

## 2020-10-23 NOTE — Assessment & Plan Note (Signed)
Small minimally symptomatic mucinous cyst of the right thumb just proximal to the proximal nail fold, distal to the interphalangeal joint of the thumb. We made a small incision with an 18-gauge needle, expressed the fluid, dressed it, she will return to see me in a month, she understands the recurrence rate.

## 2020-10-25 ENCOUNTER — Telehealth: Payer: Self-pay

## 2020-10-25 DIAGNOSIS — R300 Dysuria: Secondary | ICD-10-CM

## 2020-10-25 NOTE — Telephone Encounter (Signed)
Pt stated she has a lite fever she said she may have strep in her urine  because she had it before she said she may have it again  she wants to come in for a urine test.

## 2020-10-26 NOTE — Telephone Encounter (Signed)
LVM  Pt did not answer phone.

## 2020-10-26 NOTE — Telephone Encounter (Signed)
Ok to make appt or can just send urine culture down to lab.

## 2020-10-29 NOTE — Telephone Encounter (Signed)
LVM for pt to call back.

## 2020-10-29 NOTE — Addendum Note (Signed)
Addended by: [name] on: 10/29/2020 05:29 PM   Modules accepted: Orders

## 2020-10-29 NOTE — Telephone Encounter (Signed)
Pt. Is advised to come in for a urine test. Per Shannon [name]

## 2020-10-30 ENCOUNTER — Ambulatory Visit: Payer: 59 | Admitting: Physician Assistant

## 2020-11-08 ENCOUNTER — Encounter: Payer: 59 | Admitting: Obstetrics and Gynecology

## 2020-11-14 ENCOUNTER — Other Ambulatory Visit (HOSPITAL_COMMUNITY): Payer: Self-pay | Admitting: Psychiatry

## 2020-11-14 ENCOUNTER — Telehealth (HOSPITAL_COMMUNITY): Payer: Self-pay

## 2020-11-14 MED ORDER — ALPRAZOLAM 0.5 MG PO TABS
0.5000 mg | ORAL_TABLET | Freq: Every day | ORAL | 0 refills | Status: DC | PRN
Start: 2020-11-14 — End: 2020-12-27

## 2020-11-14 MED FILL — ALPRAZolam 0.5 MG TABS: 0.5 | 30 days supply | Qty: 30 | Fill #0

## 2020-11-14 MED FILL — VIIBRYD 10 MG TABLET: 10 | 30 days supply | Qty: 30 | Fill #1

## 2020-11-14 MED FILL — lamoTRIgine 200 MG TABS: 200 | 30 days supply | Qty: 30 | Fill #1

## 2020-11-14 MED FILL — PREGABALIN 100 MG CAPS: 100 | 30 days supply | Qty: 60 | Fill #3

## 2020-11-14 MED FILL — CITALOPRAM HBR 40 MG TABLET: 40 | 30 days supply | Qty: 30 | Fill #1

## 2020-11-14 NOTE — Telephone Encounter (Signed)
sent 

## 2020-11-14 NOTE — Telephone Encounter (Signed)
Patient needs a refill on Xanax sent to [facility] pharmacy 

## 2020-11-20 ENCOUNTER — Ambulatory Visit: Payer: 59 | Admitting: Sports Medicine

## 2020-11-27 ENCOUNTER — Other Ambulatory Visit: Payer: Self-pay | Admitting: Physician Assistant

## 2020-11-27 ENCOUNTER — Ambulatory Visit (INDEPENDENT_AMBULATORY_CARE_PROVIDER_SITE_OTHER): Payer: 59 | Admitting: Physician Assistant

## 2020-11-27 ENCOUNTER — Encounter: Payer: Self-pay | Admitting: Physician Assistant

## 2020-11-27 ENCOUNTER — Other Ambulatory Visit: Payer: Self-pay

## 2020-11-27 VITALS — BP 142/70 | HR 98 | Wt 231.1 lb

## 2020-11-27 DIAGNOSIS — R200 Anesthesia of skin: Secondary | ICD-10-CM

## 2020-11-27 DIAGNOSIS — R8290 Unspecified abnormal findings in urine: Secondary | ICD-10-CM

## 2020-11-27 DIAGNOSIS — E559 Vitamin D deficiency, unspecified: Secondary | ICD-10-CM | POA: Diagnosis not present

## 2020-11-27 DIAGNOSIS — G4733 Obstructive sleep apnea (adult) (pediatric): Secondary | ICD-10-CM | POA: Diagnosis not present

## 2020-11-27 DIAGNOSIS — Z6837 Body mass index (BMI) 37.0-37.9, adult: Secondary | ICD-10-CM

## 2020-11-27 DIAGNOSIS — E1121 Type 2 diabetes mellitus with diabetic nephropathy: Secondary | ICD-10-CM | POA: Diagnosis not present

## 2020-11-27 DIAGNOSIS — E039 Hypothyroidism, unspecified: Secondary | ICD-10-CM

## 2020-11-27 DIAGNOSIS — R202 Paresthesia of skin: Secondary | ICD-10-CM

## 2020-11-27 LAB — POCT URINALYSIS DIP (CLINITEK)
Bilirubin, UA: NEGATIVE
Glucose, UA: 100 mg/dL — AB
Ketones, POC UA: NEGATIVE mg/dL
Nitrite, UA: NEGATIVE
Spec Grav, UA: >=1.03 — AB (ref 1.010–1.025)
Urobilinogen, UA: 0.2 E.U./dL
pH, UA: 6 (ref 5.0–8.0)

## 2020-11-27 LAB — POCT GLYCOSYLATED HEMOGLOBIN (HGB A1C): Hemoglobin A1C: 7.7 % — AB (ref 4.0–5.6)

## 2020-11-27 LAB — POCT UA - MICROALBUMIN
Creatinine, POC: 300 mg/dL
Microalbumin Ur, POC: 80 mg/L

## 2020-11-27 MED ORDER — OZEMPIC (0.25 OR 0.5 MG/DOSE) 2 MG/1.5ML ~~LOC~~ SOPN: 0.2500 mg | PEN_INJECTOR | SUBCUTANEOUS | 1 refills | Status: DC

## 2020-11-27 MED FILL — OZEMPIC 0.25 OR 0.5 MG/DOSE: 2 | 42 days supply | Qty: 2 | Fill #0

## 2020-11-27 NOTE — Patient Instructions (Signed)
Diabetes Mellitus and Nutrition, Adult When you have diabetes, or diabetes mellitus, it is very important to have healthy eating habits because your blood sugar (glucose) levels are greatly affected by what you eat and drink. Eating healthy foods in the right amounts, at about the same times every day, can help you:  Control your blood glucose.  Lower your risk of heart disease.  Improve your blood pressure.  Reach or maintain a healthy weight. What can affect my meal plan? Every person with diabetes is different, and each person has different needs for a meal plan. Your health care provider may recommend that you work with a dietitian to make a meal plan that is best for you. Your meal plan may vary depending on factors such as:  The calories you need.  The medicines you take.  Your weight.  Your blood glucose, blood pressure, and cholesterol levels.  Your activity level.  Other health conditions you have, such as heart or kidney disease. How do carbohydrates affect me? Carbohydrates, also called carbs, affect your blood glucose level more than any other type of food. Eating carbs naturally raises the amount of glucose in your blood. Carb counting is a method for keeping track of how many carbs you eat. Counting carbs is important to keep your blood glucose at a healthy level, especially if you use insulin or take certain oral diabetes medicines. It is important to know how many carbs you can safely have in each meal. This is different for every person. Your dietitian can help you calculate how many carbs you should have at each meal and for each snack. How does alcohol affect me? Alcohol can cause a sudden decrease in blood glucose (hypoglycemia), especially if you use insulin or take certain oral diabetes medicines. Hypoglycemia can be a life-threatening condition. Symptoms of hypoglycemia, such as sleepiness, dizziness, and confusion, are similar to symptoms of having too much  alcohol.  Do not drink alcohol if: ? Your health care provider tells you not to drink. ? You are pregnant, may be pregnant, or are planning to become pregnant.  If you drink alcohol: ? Do not drink on an empty stomach. ? Limit how much you use to:  0-1 drink a day for women.  0-2 drinks a day for men. ? Be aware of how much alcohol is in your drink. In the [place] one drink equals one 12 oz bottle of beer (355 mL), one 5 oz glass of wine (148 mL), or one 1 oz glass of hard liquor (44 mL). ? Keep yourself hydrated with water, diet soda, or unsweetened iced tea.  Keep in mind that regular soda, juice, and other mixers may contain a lot of sugar and must be counted as carbs. What are tips for following this plan? Reading food labels  Start by checking the serving size on the "Nutrition Facts" label of packaged foods and drinks. The amount of calories, carbs, fats, and other nutrients listed on the label is based on one serving of the item. Many items contain more than one serving per package.  Check the total grams (g) of carbs in one serving. You can calculate the number of servings of carbs in one serving by dividing the total carbs by 15. For example, if a food has 30 g of total carbs per serving, it would be equal to 2 servings of carbs.  Check the number of grams (g) of saturated fats and trans fats in one serving. Choose foods that have   a low amount or none of these fats.  Check the number of milligrams (mg) of salt (sodium) in one serving. Most people should limit total sodium intake to less than 2,300 mg per day.  Always check the nutrition information of foods labeled as "low-fat" or "nonfat." These foods may be higher in added sugar or refined carbs and should be avoided.  Talk to your dietitian to identify your daily goals for nutrients listed on the label. Shopping  Avoid buying canned, pre-made, or processed foods. These foods tend to be high in fat, sodium, and added  sugar.  Shop around the outside edge of the grocery store. This is where you will most often find fresh fruits and vegetables, bulk grains, fresh meats, and fresh dairy. Cooking  Use low-heat cooking methods, such as baking, instead of high-heat cooking methods like deep frying.  Cook using healthy oils, such as olive, canola, or sunflower oil.  Avoid cooking with butter, cream, or high-fat meats. Meal planning  Eat meals and snacks regularly, preferably at the same times every day. Avoid going long periods of time without eating.  Eat foods that are high in fiber, such as fresh fruits, vegetables, beans, and whole grains. Talk with your dietitian about how many servings of carbs you can eat at each meal.  Eat 4-6 oz (112-168 g) of lean protein each day, such as lean meat, chicken, fish, eggs, or tofu. One ounce (oz) of lean protein is equal to: ? 1 oz (28 g) of meat, chicken, or fish. ? 1 egg. ?  cup (62 g) of tofu.  Eat some foods each day that contain healthy fats, such as avocado, nuts, seeds, and fish.   What foods should I eat? Fruits Berries. Apples. Oranges. Peaches. Apricots. Plums. Grapes. Mango. Papaya. Pomegranate. Kiwi. Cherries. Vegetables Lettuce. Spinach. Leafy greens, including kale, chard, collard greens, and mustard greens. Beets. Cauliflower. Cabbage. Broccoli. Carrots. Green beans. Tomatoes. Peppers. Onions. Cucumbers. Brussels sprouts. Grains Whole grains, such as whole-wheat or whole-grain bread, crackers, tortillas, cereal, and pasta. Unsweetened oatmeal. Quinoa. Brown or wild rice. Meats and other proteins Seafood. Poultry without skin. Lean cuts of poultry and beef. Tofu. Nuts. Seeds. Dairy Low-fat or fat-free dairy products such as milk, yogurt, and cheese. The items listed above may not be a complete list of foods and beverages you can eat. Contact a dietitian for more information. What foods should I avoid? Fruits Fruits canned with  syrup. Vegetables Canned vegetables. Frozen vegetables with butter or cream sauce. Grains Refined white flour and flour products such as bread, pasta, snack foods, and cereals. Avoid all processed foods. Meats and other proteins Fatty cuts of meat. Poultry with skin. Breaded or fried meats. Processed meat. Avoid saturated fats. Dairy Full-fat yogurt, cheese, or milk. Beverages Sweetened drinks, such as soda or iced tea. The items listed above may not be a complete list of foods and beverages you should avoid. Contact a dietitian for more information. Questions to ask a health care provider  Do I need to meet with a diabetes educator?  Do I need to meet with a dietitian?  What number can I call if I have questions?  When are the best times to check my blood glucose? Where to find more information:  American Diabetes Association: diabetes.org  Academy of Nutrition and Dietetics: www.eatright.org  National Institute of Diabetes and Digestive and Kidney Diseases: www.niddk.nih.gov  Association of Diabetes Care and Education Specialists: www.diabeteseducator.org Summary  It is important to have healthy eating   habits because your blood sugar (glucose) levels are greatly affected by what you eat and drink.  A healthy meal plan will help you control your blood glucose and maintain a healthy lifestyle.  Your health care provider may recommend that you work with a dietitian to make a meal plan that is best for you.  Keep in mind that carbohydrates (carbs) and alcohol have immediate effects on your blood glucose levels. It is important to count carbs and to use alcohol carefully. This information is not intended to replace advice given to you by your health care provider. Make sure you discuss any questions you have with your health care provider. Document Revised: 09/13/2019 Document Reviewed: 09/13/2019 Elsevier Patient Education  2021 Elsevier Inc.  

## 2020-11-27 NOTE — Progress Notes (Signed)
Subjective:    Patient ID: Shannon [name], female    DOB: 08/17/1966, 55 y.o.   MRN: [id number]  HPI  Patient is a 55 year old female with OSA, hypothyroidism, history of type 2 diabetes, history of gastric laparoscopic sleeve surgery who presents to the clinic with numbness and tingling of both hands and feet.  This is been going on for a few weeks.  She is concerned.  She denies any excessive thirst or urination.  She does admit to fatigue and increase in anxiety.  She is taking over-the-counter B12.  She denies any falls or injuries. Hx of IDA then increased serum iron. She wants labs . .. Active Ambulatory Problems    Diagnosis Date Noted  . Hyperlipidemia 11/22/2008  . Sleep apnea 02/05/2012  . Mood disorder ([place]) 02/05/2012  . OSA on CPAP 03/31/2014  . Abnormal weight gain 03/31/2014  . Class 2 severe obesity due to excess calories with serious comorbidity and body mass index (BMI) of 37.0 to 37.9 in adult ([place]) 03/31/2014  . Hypothyroidism 08/15/2014  . Type II diabetes mellitus with nephropathy ([place]) 08/15/2014  . DDD (degenerative disc disease), lumbar 08/28/2014  . Generalized anxiety disorder 03/16/2015  . Hiatal hernia 08/13/2015  . 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 Cervical myofascial pain syndrome 07/31/2016  . Fibroids 10/30/2016  . 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 Iron deficiency anemia secondary to inadequate dietary iron intake 03/25/2018  . Chest pain 05/09/2018  . BPPV (benign paroxysmal positional vertigo), right 05/09/2018  . Polyp of colon 05/21/2018  . Primary osteoarthritis of right knee 10/06/2018  . Acute lateral meniscus tear of right knee 03/17/2019  . Loss of balance 05/24/2019  . Vertigo 05/24/2019  . OAB (overactive bladder) 11/23/2019  . Fever 08/13/2020  . Fatigue 08/13/2020  . Family history of leukemia 08/14/2020  . SOB (shortness of breath) on exertion 08/14/2020  . Bilateral lower extremity edema 08/14/2020  . Elevated platelet count  08/14/2020  . Digital mucinous cyst of right thumb 10/23/2020  . Vitamin D deficiency 11/27/2020   Resolved Ambulatory Problems    Diagnosis Date Noted  . Unspecified otitis media 11/22/2008  . Acute sinusitis, unspecified 11/22/2008  . URI 09/28/2009  . BACK PAIN, LUMBAR 12/26/2008  . Burning sensation of the foot 08/28/2014  . Numbness of both lower extremities 08/28/2014  . BMI 38.0-38.9,adult 12/29/2014  . Status post gastric surgery 11/12/2015   Past Medical History:  Diagnosis Date  . Anxiety   . Arthritis   . Depression   . Diabetes mellitus type II, controlled ([place]) 08/15/2014  . GERD (gastroesophageal reflux disease)   . HPV (human papilloma virus) infection   . Lateral meniscus tear   . PONV (postoperative nausea and vomiting)   . Pre-diabetes   . Shortness of breath dyspnea   . Stress incontinence      Review of Systems  Constitutional: Positive for fatigue. Negative for fever and unexpected weight change.  HENT: Negative.   Respiratory: Negative.   Cardiovascular: Negative.   Gastrointestinal: Negative.   Musculoskeletal: Positive for arthralgias and myalgias.  Neurological: Positive for numbness. Negative for dizziness.  Hematological: Negative.   Psychiatric/Behavioral: Positive for agitation. The patient is nervous/anxious.        Objective:   Physical Exam Vitals reviewed.  Constitutional:      Appearance: Normal appearance. She is obese.  Cardiovascular:     Rate and Rhythm: Normal rate and regular rhythm.     Pulses: Normal pulses.  Heart sounds: Normal heart sounds.  Pulmonary:     Effort: Pulmonary effort is normal.     Breath sounds: Normal breath sounds.  Abdominal:     General: Bowel sounds are normal. There is no distension.     Palpations: Abdomen is soft.     Tenderness: There is no abdominal tenderness. There is no right CVA tenderness, left CVA tenderness or guarding.  Musculoskeletal:     Right lower leg: No edema.     Left  lower leg: No edema.  Skin:    Comments: 2 plus pedal pulses.   Neurological:     General: No focal deficit present.     Mental Status: She is alert and oriented to person, place, and time.  Psychiatric:        Mood and Affect: Mood normal.   no swelling of hands and feet.  Upper and lower ext strength 5/5.     .. Results for orders placed or performed in visit on 11/27/20  Fe+TIBC+Fer  Result Value Ref Range   Iron 78 45 - 160 mcg/dL   TIBC 454 (H) 250 - 450 mcg/dL (calc)   %SAT 17 16 - 45 % (calc)   Ferritin 12 (L) 16 - 232 ng/mL  COMPLETE METABOLIC PANEL WITH GFR  Result Value Ref Range   Glucose, Bld 241 (H) 65 - 139 mg/dL   BUN 17 7 - 25 mg/dL   Creat 0.82 0.50 - 1.05 mg/dL   GFR, Est Non African American 82 > OR = 60 mL/min/1.37m2   GFR, Est African American 95 > OR = 60 mL/min/1.38m2   BUN/Creatinine Ratio NOT APPLICABLE 6 - 22 (calc)   Sodium 141 135 - 146 mmol/L   Potassium 4.3 3.5 - 5.3 mmol/L   Chloride 104 98 - 110 mmol/L   CO2 26 20 - 32 mmol/L   Calcium 9.5 8.6 - 10.4 mg/dL   Total Protein 7.2 6.1 - 8.1 g/dL   Albumin 4.4 3.6 - 5.1 g/dL   Globulin 2.8 1.9 - 3.7 g/dL (calc)   AG Ratio 1.6 1.0 - 2.5 (calc)   Total Bilirubin 0.4 0.2 - 1.2 mg/dL   Alkaline phosphatase (APISO) 95 37 - 153 U/L   AST 38 (H) 10 - 35 U/L   ALT 39 (H) 6 - 29 U/L  B12 and Folate Panel  Result Value Ref Range   Vitamin B-12 480 200 - 1,100 pg/mL   Folate 18.1 ng/mL  VITAMIN D 25 Hydroxy (Vit-D Deficiency, Fractures)  Result Value Ref Range   Vit D, 25-Hydroxy 13 (L) 30 - 100 ng/mL  TSH  Result Value Ref Range   TSH 1.54 mIU/L  POCT URINALYSIS DIP (CLINITEK)  Result Value Ref Range   Color, UA yellow yellow   Clarity, UA clear clear   Glucose, UA =100 (A) negative mg/dL   Bilirubin, UA negative negative   Ketones, POC UA negative negative mg/dL   Spec Grav, UA >=1.030 (A) 1.010 - 1.025   Blood, UA trace-lysed (A) negative   pH, UA 6.0 5.0 - 8.0   POC PROTEIN,UA trace  negative, trace   Urobilinogen, UA 0.2 0.2 or 1.0 E.U./dL   Nitrite, UA Negative Negative   Leukocytes, UA Moderate (2+) (A) Negative  POCT glycosylated hemoglobin (Hb A1C)  Result Value Ref Range   Hemoglobin A1C 7.7 (A) 4.0 - 5.6 %   HbA1c POC (<> result, manual entry)     HbA1c, POC (prediabetic range)  HbA1c, POC (controlled diabetic range)    POCT UA - Microalbumin  Result Value Ref Range   Microalbumin Ur, POC 80 mg/L   Creatinine, POC 300 mg/dL   Albumin/Creatinine Ratio, Urine, POC 30-300        Assessment & Plan:  Marland KitchenMarland KitchenDiagnoses and all orders for this visit:  Type II diabetes mellitus with nephropathy ([place]) -     Semaglutide,0.25 or 0.5MG /DOS, (OZEMPIC, 0.25 OR 0.5 MG/DOSE,) 2 MG/1.5ML SOPN; Inject 0.25 mg into the skin once a week. -     POCT URINALYSIS DIP (CLINITEK) -     POCT glycosylated hemoglobin (Hb A1C) -     POCT UA - Microalbumin  Vitamin D deficiency -     Fe+TIBC+Fer -     COMPLETE METABOLIC PANEL WITH GFR -     B12 and Folate Panel -     VITAMIN D 25 Hydroxy (Vit-D Deficiency, Fractures) -     TSH  Numbness and tingling in both hands -     Fe+TIBC+Fer -     COMPLETE METABOLIC PANEL WITH GFR -     B12 and Folate Panel -     VITAMIN D 25 Hydroxy (Vit-D Deficiency, Fractures) -     TSH  Numbness and tingling of both feet -     Fe+TIBC+Fer -     COMPLETE METABOLIC PANEL WITH GFR -     B12 and Folate Panel -     VITAMIN D 25 Hydroxy (Vit-D Deficiency, Fractures) -     TSH  Class 2 severe obesity due to excess calories with serious comorbidity and body mass index (BMI) of 37.0 to 37.9 in adult ([facility])  Abnormal urine odor -     Urine Culture  Hypothyroidism, unspecified type   A1c is 7.7 and has transition back into diabetes range.  I suspect that the numbness and tingling is neuropathy from her uncontrolled diabetes. Patient refuses Metformin. Discuss DM diet and exercise.  Started Ozempic.  Discussed side effects.  Hopefully this will help  with weight and sugar control.  Follow-up in 3 months. She is spilling a little bit of protein in her urine from the microalbumin.  Suggested a ace inhibitor.  Patient declined today.  Discussed risk of not treating proteinuria.  We'll recheck when sugars are more controlled. Strongly urged patient to consider a statin.  Right now she does not want to do that.  She is aware of risks. Encourage yearly foot exam and eye exam. Flu and COVID shots are up-to-date.  Pt is on lyrica and sound help with some neuropathy. Will check CBC/ferritin/B12/vitamin D to look for any other reasons for symptoms.   UA glucose and leukocytes. Will send for culture.

## 2020-11-28 ENCOUNTER — Encounter: Payer: Self-pay | Admitting: Obstetrics and Gynecology

## 2020-11-28 ENCOUNTER — Ambulatory Visit (INDEPENDENT_AMBULATORY_CARE_PROVIDER_SITE_OTHER): Payer: 59

## 2020-11-28 ENCOUNTER — Encounter: Payer: Self-pay | Admitting: Physician Assistant

## 2020-11-28 ENCOUNTER — Other Ambulatory Visit (HOSPITAL_COMMUNITY)
Admission: RE | Admit: 2020-11-28 | Discharge: 2020-11-28 | Disposition: A | Discharge status: Home / Self Care | Payer: 59 | Source: Ambulatory Visit | Attending: Obstetrics and Gynecology | Admitting: Obstetrics and Gynecology

## 2020-11-28 ENCOUNTER — Other Ambulatory Visit: Payer: Self-pay | Admitting: Physician Assistant

## 2020-11-28 ENCOUNTER — Ambulatory Visit (INDEPENDENT_AMBULATORY_CARE_PROVIDER_SITE_OTHER): Payer: 59 | Admitting: Obstetrics and Gynecology

## 2020-11-28 VITALS — BP 172/85 | HR 86 | Resp 16 | Ht 65.0 in | Wt 231.0 lb

## 2020-11-28 DIAGNOSIS — N898 Other specified noninflammatory disorders of vagina: Secondary | ICD-10-CM

## 2020-11-28 DIAGNOSIS — Z1231 Encounter for screening mammogram for malignant neoplasm of breast: Secondary | ICD-10-CM

## 2020-11-28 DIAGNOSIS — Z01419 Encounter for gynecological examination (general) (routine) without abnormal findings: Secondary | ICD-10-CM | POA: Insufficient documentation

## 2020-11-28 LAB — COMPLETE METABOLIC PANEL WITH GFR
AG Ratio: 1.6 (calc) (ref 1.0–2.5)
ALT: 39 U/L — ABNORMAL HIGH (ref 6–29)
AST: 38 U/L — ABNORMAL HIGH (ref 10–35)
Albumin: 4.4 g/dL (ref 3.6–5.1)
Alkaline phosphatase (APISO): 95 U/L (ref 37–153)
BUN: 17 mg/dL (ref 7–25)
CO2: 26 mmol/L (ref 20–32)
Calcium: 9.5 mg/dL (ref 8.6–10.4)
Chloride: 104 mmol/L (ref 98–110)
Creat: 0.82 mg/dL (ref 0.50–1.05)
GFR, Est African American: 95 mL/min/{1.73_m2} (ref >=60)
GFR, Est Non African American: 82 mL/min/{1.73_m2} (ref >=60)
Globulin: 2.8 g/dL (calc) (ref 1.9–3.7)
Glucose, Bld: 241 mg/dL — ABNORMAL HIGH (ref 65–139)
Potassium: 4.3 mmol/L (ref 3.5–5.3)
Sodium: 141 mmol/L (ref 135–146)
Total Bilirubin: 0.4 mg/dL (ref 0.2–1.2)
Total Protein: 7.2 g/dL (ref 6.1–8.1)

## 2020-11-28 LAB — B12 AND FOLATE PANEL
Folate: 18.1 ng/mL
Vitamin B-12: 480 pg/mL (ref 200–1100)

## 2020-11-28 LAB — IRON,TIBC AND FERRITIN PANEL
%SAT: 17 % (calc) (ref 16–45)
Ferritin: 12 ng/mL — ABNORMAL LOW (ref 16–232)
Iron: 78 ug/dL (ref 45–160)
TIBC: 454 mcg/dL (calc) — ABNORMAL HIGH (ref 250–450)

## 2020-11-28 LAB — VITAMIN D 25 HYDROXY (VIT D DEFICIENCY, FRACTURES): Vit D, 25-Hydroxy: 13 ng/mL — ABNORMAL LOW (ref 30–100)

## 2020-11-28 LAB — TSH: TSH: 1.54 mIU/L

## 2020-11-28 MED ORDER — VITAMIN D (ERGOCALCIFEROL) 1.25 MG (50000 UNIT) PO CAPS: 50000.0000 [IU] | ORAL_CAPSULE | ORAL | 0 refills | Status: DC

## 2020-11-28 MED ORDER — FLUCONAZOLE 150 MG PO TABS: 150.0000 mg | ORAL_TABLET | Freq: Every day | ORAL | 1 refills | Status: DC

## 2020-11-28 MED ORDER — CLOTRIMAZOLE-BETAMETHASONE 1-0.05 % EX CREA: 1.0000 "application " | TOPICAL_CREAM | Freq: Two times a day (BID) | CUTANEOUS | 0 refills | Status: DC

## 2020-11-28 MED FILL — FLUCONAZOLE 150 MG TABS: 150 | 3 days supply | Qty: 2 | Fill #0

## 2020-11-28 MED FILL — CLOTRIMAZOLE-BETAMETHASONE: 1-0.05 | 15 days supply | Qty: 30 | Fill #0

## 2020-11-28 MED FILL — VIT D2 1.25 MG (50,000 UNIT: 1.25 MG | 84 days supply | Qty: 12 | Fill #0

## 2020-11-28 NOTE — Progress Notes (Signed)
GYNECOLOGY ANNUAL PREVENTATIVE CARE ENCOUNTER NOTE  History:     Shannon [name] is a 55 y.o. [id number] female here for a routine annual gynecologic exam.  Current complaints: vaginal irritation; recent diagnoses of type 2 DM.   Denies abnormal vaginal bleeding or pelvic pain. She is not sexually active and hasn't been for years.  IUD placed 2017/2018 would like it removed today.  Has PCP    Gynecologic History No LMP recorded. (Menstrual status: IUD). Contraception: IUD Last Pap: 2018. Results were: abnormal with + HPV Last mammogram: done today 11/28/20  Obstetric History OB History  Gravida Para Term Preterm AB Living  1 1 1     1   SAB IAB Ectopic Multiple Live Births               # Outcome Date GA Lbr Len/2nd Weight Sex Delivery Anes PTL Lv  1 Term             Past Medical History:  Diagnosis Date  . Anxiety   . Arthritis    back and hips   . Depression   . Diabetes mellitus type II, controlled ([place]) 08/15/2014  . GERD (gastroesophageal reflux disease)   . HPV (human papilloma virus) infection   . Hyperlipidemia   . Lateral meniscus tear    left knee  . PONV (postoperative nausea and vomiting)    slow to wake up   . Pre-diabetes   . Shortness of breath dyspnea    with exertion   . Sleep apnea    cpap -0 setting at 16, uses CPAP nightly  . Stress incontinence     Past Surgical History:  Procedure Laterality Date  . APPENDECTOMY    . BREATH TEK H PYLORI N/A 04/03/2015   Procedure: BREATH TEK H PYLORI;  Surgeon: Greer Pickerel, MD;  Location: [facility];  Service: General;  Laterality: N/A;  . CESAREAN SECTION    . 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   second interspace, left foot  . feet surgery    . KNEE ARTHROSCOPY Right 03/17/2019   Procedure: RIGHT KNEE ARTHROSCOPY WITH PARTIAL LATERAL MENISCECTOMY, REMOVAL OF LOOSE BODIES, 3 COMPARTMENT CHONDROPLASTY;  Surgeon: Mcarthur Rossetti, MD;  Location: [facility];  Service: Orthopedics;  Laterality: Right;  . LAPAROSCOPIC GASTRIC SLEEVE RESECTION WITH HIATAL HERNIA REPAIR N/A 08/13/2015   Procedure: LAPAROSCOPIC GASTRIC SLEEVE RESECTION WITH HIATAL HERNIA REPAIR;  Surgeon: Greer Pickerel, MD;  Location: [facility];  Service: General;  Laterality: N/A;  . laproscopy    . UPPER GI ENDOSCOPY  08/13/2015   Procedure: UPPER GI ENDOSCOPY;  Surgeon: Greer Pickerel, MD;  Location: [facility];  Service: General;;  . WISDOM TOOTH EXTRACTION      Current Outpatient Medications on File Prior to Visit  Medication Sig Dispense Refill  . ALPRAZolam (XANAX) 0.5 MG tablet Take 1 tablet (0.5 mg total) by mouth daily as needed. for anxiety 30 tablet 0  . citalopram (CELEXA) 40 MG tablet Take 1 tablet (40 mg total) by mouth daily. 30 tablet 1  . lamoTRIgine (LAMICTAL) 200 MG tablet Take 1 tablet (200 mg total) by mouth daily. 30 tablet 1  . pregabalin (LYRICA) 100 MG capsule TAKE 1 CAPSULE (100 MG TOTAL) BY MOUTH 2 (TWO) TIMES DAILY. 60 capsule 3  . Semaglutide,0.25 or 0.5MG /DOS, (OZEMPIC, 0.25 OR 0.5 MG/DOSE,) 2 MG/1.5ML SOPN Inject 0.25 mg into the skin once  a week. 1.5 mL 1  . solifenacin (VESICARE) 10 MG tablet Take 1 tablet (10 mg total) by mouth daily. 90 tablet 3  . Vilazodone HCl (VIIBRYD) 10 MG TABS TAKE 1 TABLET (10 MG TOTAL) BY MOUTH DAILY. 30 tablet 1   No current facility-administered medications on file prior to visit.    Allergies  Allergen Reactions  . Food Swelling    Big Red Gum makes her tongue swell  . Latuda [Lurasidone Hcl] Other (See Comments)    Severe aggitation  . Lipitor [Atorvastatin] Other (See Comments)    Muscle aches  . Belviq [Lorcaserin Hcl]     Increased appetitie  . Contrave [Naltrexone-Bupropion Hcl Er] Other (See Comments)    Sleepy.    Social History:  reports that she has never smoked. She has never used smokeless tobacco. She reports that she does not drink alcohol and does not  use drugs.  Family History  Problem Relation Age of Onset  . Hyperlipidemia Mother   . Sleep apnea Mother   . Depression Mother   . Diabetes Father   . Hypertension Father   . COPD Father   . Diabetes Paternal Aunt   . Diabetes Paternal Uncle   . Alcohol abuse Paternal Uncle   . Leukemia Paternal Uncle   . Alcohol abuse Paternal Grandfather   . Depression Sister     The following portions of the patient's history were reviewed and updated as appropriate: allergies, current medications, past family history, past medical history, past social history, past surgical history and problem list.  Review of Systems Pertinent items noted in HPI and remainder of comprehensive ROS otherwise negative.  Physical Exam:  BP (!) 172/85   Pulse 86   Resp 16   Ht 5\' 5"  (1.651 m)   Wt 231 lb (104.8 kg)   BMI 38.44 kg/m  CONSTITUTIONAL: Well-developed, well-nourished female in no acute distress.  HENT:  Normocephalic, atraumatic, External right and left ear normal.  EYES: Conjunctivae and EOM are normal. Pupils are equal, round, and reactive to light. No scleral icterus.  NECK: Normal range of motion, supple, no masses.  Normal thyroid.  SKIN: Skin is warm and dry. No rash noted. Not diaphoretic. No erythema. No pallor. MUSCULOSKELETAL: Normal range of motion. No tenderness.  No cyanosis, clubbing, or edema. NEUROLOGIC: Alert and oriented to person, place, and time. Normal reflexes, muscle tone coordination.  PSYCHIATRIC: Normal mood and affect. Normal behavior. Normal judgment and thought content. CARDIOVASCULAR: Normal heart rate noted, regular rhythm RESPIRATORY: Clear to auscultation bilaterally. Effort and breath sounds normal, no problems with respiration noted. BREASTS: Symmetric in size. No masses, tenderness, skin changes, nipple drainage, or lymphadenopathy bilaterally. Performed in the presence of a chaperone. ABDOMEN: Soft, no distention noted.  No tenderness, rebound or guarding.   PELVIC: erythematous appearing external genitalia and urethral meatus; erythematous appearing vaginal mucosa and cervix. Watery, abnormal discharge noted; no odor.   Pap smear obtained.  Difficult collection of pap d/t patients discomfort during exam. Normal uterine size, no other palpable masses, no uterine or adnexal tenderness.  Performed in the presence of a chaperone.   Assessment and Plan:    1. Well woman exam with routine gynecological exam  - Cytology - PAP( [place]) - MM Digital Screening; Future  2. Vaginal itching  - Cervicovaginal ancillary only( [place]) - IUD not removed today d/t discomfort with exam. If she decides to come back for removal would recommend pre medication.   Will follow up  results of pap smear and manage accordingly. Mammogram scheduled Routine preventative health maintenance measures emphasized. Please refer to After Visit Summary for other counseling recommendations.       Rasch, Artist Pais, Guy for [employer], [place]

## 2020-11-28 NOTE — Progress Notes (Signed)
[place],   Iron decreased but still in normal range. Your iron stores are low though. You need to be on some iron at least once a day with MVI.  Liver enzymes are up but not by much and likely due to diabetes and weight gain. Will recheck in 3 months.  Thyroid great.  B12 great.  Vitamin D very low. Need to be on high dose weekly and recheck in 3 months.

## 2020-11-29 LAB — CERVICOVAGINAL ANCILLARY ONLY
Bacterial Vaginitis (gardnerella): NEGATIVE
Candida Glabrata: NEGATIVE
Candida Vaginitis: NEGATIVE
Comment: NEGATIVE
Comment: NEGATIVE
Comment: NEGATIVE

## 2020-11-29 LAB — URINE CULTURE
MICRO NUMBER:: 11508290
SPECIMEN QUALITY:: ADEQUATE

## 2020-11-29 LAB — CYTOLOGY - PAP
Comment: NEGATIVE
Diagnosis: NEGATIVE
High risk HPV: NEGATIVE

## 2020-11-29 NOTE — Progress Notes (Signed)
No concerning bacteria found in urine culture.

## 2020-12-05 ENCOUNTER — Ambulatory Visit: Payer: 59 | Admitting: Orthopaedic Surgery

## 2020-12-05 ENCOUNTER — Encounter: Payer: Self-pay | Admitting: Orthopaedic Surgery

## 2020-12-05 ENCOUNTER — Ambulatory Visit (INDEPENDENT_AMBULATORY_CARE_PROVIDER_SITE_OTHER): Payer: 59

## 2020-12-05 VITALS — Ht 65.0 in | Wt 231.0 lb

## 2020-12-05 DIAGNOSIS — M1711 Unilateral primary osteoarthritis, right knee: Secondary | ICD-10-CM

## 2020-12-05 NOTE — Progress Notes (Signed)
Office Visit Note   Patient: Shannon [name]           Date of Birth: 07-05-1966           MRN: [id number] Visit Date: 12/05/2020              Requested by: [name], PA-C [address],  [place] [id number] PCP: [name], PA-C   Assessment & Plan: Visit Diagnoses:  1. Primary osteoarthritis of right knee     Plan: We will try to gain approval for Monovisc injections.  Again Durolane did not help with her knee replacement.  Patient would like to continue to work on weight loss and quad strengthening and avoid surgery if possible.  She can also begin using Voltaren gel 4 times a day as she is only using it once a day on the knee.  Follow-Up Instructions: Return for Supplemental injection.   Orders:  Orders Placed This Encounter  Procedures  . XR Knee 1-2 Views Right   No orders of the defined types were placed in this encounter.     Procedures: No procedures performed   Clinical Data: No additional findings.   Subjective: Chief Complaint  Patient presents with  . Right Knee - Pain    HPI Shannon [name] returns today complaining of right knee pain.  She has had gel injections in the past and the last Durolane injections did not give her relief.  She has had good relief in the past with Monovisc.  She has had no new injury to either knee.  She does note that she would like to get a new knee brace that she wears on the right as the knee brace she has worn out.  No new injury to either knee.  She has recently been diagnosed with diabetes mellitus. Review of Systems See HPI otherwise negative or noncontributory.  Objective: Vital Signs: Ht 5\' 5"  (1.651 m)   Wt 231 lb (104.8 kg)   BMI 38.44 kg/m   Physical Exam Constitutional:      Appearance: She is not ill-appearing or diaphoretic.  Pulmonary:     Effort: Pulmonary effort is normal.  Neurological:     Mental Status: She is alert and oriented to person, place, and time.  Psychiatric:         Mood and Affect: Mood normal.     Ortho Exam Bilateral knees significant patellofemoral crepitus.  She has tenderness to right knee along medial and lateral joint line.  No abnormal warmth erythema or effusion.  No instability valgus varus stressing of either knee. Specialty Comments:  No specialty comments available.  Imaging: XR Knee 1-2 Views Right  Result Date: 12/05/2020 Right knee AP lateral views: No acute fracture.  Knee is well located.  Periarticular spurring off the lateral compartment with moderate narrowing.  Mild to moderate patellofemoral changes.  These findings seen popliteal region of the knee unchanged from prior films.    PMFS History: Patient Active Problem List   Diagnosis Date Noted  . Vitamin D deficiency 11/27/2020  . Digital mucinous cyst of right thumb 10/23/2020  . Family history of leukemia 08/14/2020  . SOB (shortness of breath) on exertion 08/14/2020  . Bilateral lower extremity edema 08/14/2020  . Elevated platelet count 08/14/2020  . Fever 08/13/2020  . Fatigue 08/13/2020  . OAB (overactive bladder) 11/23/2019  . Loss of balance 05/24/2019  . Vertigo 05/24/2019  . Acute lateral meniscus tear of right knee  03/17/2019  . Primary osteoarthritis of right knee 10/06/2018  . Polyp of colon 05/21/2018  . Chest pain 05/09/2018  . BPPV (benign paroxysmal positional vertigo), right 05/09/2018  . Iron deficiency anemia secondary to inadequate dietary iron intake 03/25/2018  . 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 Fibroids 10/30/2016  . Cervical myofascial pain syndrome 07/31/2016  . Hiatal hernia 08/13/2015  . 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 Generalized anxiety disorder 03/16/2015  . DDD (degenerative disc disease), lumbar 08/28/2014  . Hypothyroidism 08/15/2014  . Type II diabetes mellitus with nephropathy ([place]) 08/15/2014  . OSA on CPAP 03/31/2014  . Abnormal weight gain 03/31/2014  . Class 2 severe obesity due to excess calories with  serious comorbidity and body mass index (BMI) of 37.0 to 37.9 in adult ([place]) 03/31/2014  . Sleep apnea 02/05/2012  . Mood disorder ([place]) 02/05/2012  . Hyperlipidemia 11/22/2008   Past Medical History:  Diagnosis Date  . Anxiety   . Arthritis    back and hips   . Depression   . Diabetes mellitus type II, controlled ([place]) 08/15/2014  . GERD (gastroesophageal reflux disease)   . HPV (human papilloma virus) infection   . Hyperlipidemia   . Lateral meniscus tear    left knee  . PONV (postoperative nausea and vomiting)    slow to wake up   . Pre-diabetes   . Shortness of breath dyspnea    with exertion   . Sleep apnea    cpap -0 setting at 16, uses CPAP nightly  . Stress incontinence     Family History  Problem Relation Age of Onset  . Hyperlipidemia Mother   . Sleep apnea Mother   . Depression Mother   . Diabetes Father   . Hypertension Father   . COPD Father   . Diabetes Paternal Aunt   . Diabetes Paternal Uncle   . Alcohol abuse Paternal Uncle   . Leukemia Paternal Uncle   . Alcohol abuse Paternal Grandfather   . Depression Sister     Past Surgical History:  Procedure Laterality Date  . APPENDECTOMY    . BREATH TEK H PYLORI N/A 04/03/2015   Procedure: BREATH TEK H PYLORI;  Surgeon: Greer Pickerel, MD;  Location: [facility];  Service: General;  Laterality: N/A;  . CESAREAN SECTION    . 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   second interspace, left foot  . feet surgery    . KNEE ARTHROSCOPY Right 03/17/2019   Procedure: RIGHT KNEE ARTHROSCOPY WITH PARTIAL LATERAL MENISCECTOMY, REMOVAL OF LOOSE BODIES, 3 COMPARTMENT CHONDROPLASTY;  Surgeon: Mcarthur Rossetti, MD;  Location: [facility];  Service: Orthopedics;  Laterality: Right;  . LAPAROSCOPIC GASTRIC SLEEVE RESECTION WITH HIATAL HERNIA REPAIR N/A 08/13/2015   Procedure: LAPAROSCOPIC GASTRIC SLEEVE RESECTION WITH HIATAL  HERNIA REPAIR;  Surgeon: Greer Pickerel, MD;  Location: [facility];  Service: General;  Laterality: N/A;  . laproscopy    . UPPER GI ENDOSCOPY  08/13/2015   Procedure: UPPER GI ENDOSCOPY;  Surgeon: Greer Pickerel, MD;  Location: [facility];  Service: General;;  . WISDOM TOOTH EXTRACTION     Social History   Occupational History  . Not on file  Tobacco Use  . Smoking status: Never Smoker  . Smokeless tobacco: Never Used  Vaping Use  . Vaping Use: Never used  Substance and Sexual Activity  . Alcohol use: No    Alcohol/week: 0.0 standard drinks  .  Drug use: No  . Sexual activity: Yes    Birth control/protection: Condom, I.U.D.    Comment: IUD inserted 12/12/16

## 2020-12-06 ENCOUNTER — Telehealth: Payer: Self-pay

## 2020-12-06 NOTE — Telephone Encounter (Signed)
Noted  

## 2020-12-06 NOTE — Telephone Encounter (Signed)
Monovisc right knee Durolane did not work for her

## 2020-12-11 ENCOUNTER — Encounter (HOSPITAL_COMMUNITY): Payer: Self-pay | Admitting: Psychiatry

## 2020-12-11 ENCOUNTER — Other Ambulatory Visit (HOSPITAL_COMMUNITY): Payer: Self-pay | Admitting: Psychiatry

## 2020-12-11 ENCOUNTER — Telehealth (INDEPENDENT_AMBULATORY_CARE_PROVIDER_SITE_OTHER): Payer: 59 | Admitting: Psychiatry

## 2020-12-11 DIAGNOSIS — F411 Generalized anxiety disorder: Secondary | ICD-10-CM

## 2020-12-11 DIAGNOSIS — F331 Major depressive disorder, recurrent, moderate: Secondary | ICD-10-CM | POA: Diagnosis not present

## 2020-12-11 DIAGNOSIS — F410 Panic disorder [episodic paroxysmal anxiety] without agoraphobia: Secondary | ICD-10-CM | POA: Diagnosis not present

## 2020-12-11 MED ORDER — LAMOTRIGINE 200 MG PO TABS: 200.0000 mg | ORAL_TABLET | Freq: Every day | ORAL | 1 refills | Status: DC

## 2020-12-11 MED ORDER — CITALOPRAM HYDROBROMIDE 40 MG PO TABS: 40.0000 mg | ORAL_TABLET | Freq: Every day | ORAL | 1 refills | Status: DC

## 2020-12-11 MED ORDER — VIIBRYD 10 MG PO TABS: ORAL_TABLET | ORAL | 1 refills | Status: DC

## 2020-12-11 MED FILL — CITALOPRAM HBR 40 MG TABLET: 40 | 30 days supply | Qty: 30 | Fill #0

## 2020-12-11 MED FILL — LAMOTRIGINE 200 MG TABS: 200 | 30 days supply | Qty: 30 | Fill #0

## 2020-12-11 MED FILL — VIIBRYD 10 MG TABLET: 10 | 30 days supply | Qty: 30 | Fill #0

## 2020-12-11 NOTE — Progress Notes (Signed)
Patient ID: Shannon [name], female   DOB: 1966/06/03, 56 y.o.   MRN: [id number]  [facility] Follow up visit  Shannon [name] [id number] 55 y.o.  12/11/2020 9:48 AM  Chief Complaint:  Depression follow up   History of Present Illness:    Virtual Visit via Telephone Note  I connected with Shannon [name] on 12/11/20 at  9:30 AM EST by telephone and verified that I am speaking with the correct person using two identifiers.  Location: Patient: parked car Provider: office   I discussed the limitations, risks, security and privacy concerns of performing an evaluation and management service by telephone and the availability of in person appointments. I also discussed with the patient that there may be a patient responsible charge related to this service. The patient expressed understanding and agreed to proceed.      I discussed the assessment and treatment plan with the patient. The patient was provided an opportunity to ask questions and all were answered. The patient agreed with the plan and demonstrated an understanding of the instructions.   The patient was advised to call back or seek an in-person evaluation if the symptoms worsen or if the condition fails to improve as anticipated.  I provided 12 - 15  minutes of non-face-to-face time during this encounter.   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   Duration  5 plus years    Past Psychiatric History/Hospitalization(s) Manged for depression and mood symptoms for more then 20 years.  Prior Suicide Attempts: No  Medical History; Past Medical History:  Diagnosis Date  . Anxiety   . Arthritis    back and hips   . Depression   .  Diabetes mellitus type II, controlled ([place]) 08/15/2014  . GERD (gastroesophageal reflux disease)   . HPV (human papilloma virus) infection   . Hyperlipidemia   . Lateral meniscus tear    left knee  . PONV (postoperative nausea and vomiting)    slow to wake up   . Pre-diabetes   . Shortness of breath dyspnea    with exertion   . Sleep apnea    cpap -0 setting at 16, uses CPAP nightly  . Stress incontinence     Allergies: Allergies  Allergen Reactions  . Food Swelling    [place] Gum makes her tongue swell  . Latuda [Lurasidone Hcl] Other (See Comments)    Severe aggitation  . Lipitor [Atorvastatin] Other (See Comments)    Muscle aches  . Belviq [Lorcaserin Hcl]     Increased appetitie  . Contrave [Naltrexone-Bupropion Hcl Er] Other (See Comments)    Sleepy.    Medications: Outpatient Encounter Medications as of 12/11/2020  Medication Sig  . ALPRAZolam (XANAX) 0.5 MG tablet Take 1 tablet (0.5 mg total) by mouth daily as needed. for anxiety  . citalopram (CELEXA) 40 MG tablet Take 1 tablet (40 mg total) by mouth daily.  . clotrimazole-betamethasone (LOTRISONE) cream Apply 1 application topically 2 (two) times daily. For external use.  . fluconazole (DIFLUCAN) 150 MG tablet Take 1 tablet (150 mg total) by mouth daily. Take one dose today and repeat in 3 days.  Marland Kitchen lamoTRIgine (  LAMICTAL) 200 MG tablet Take 1 tablet (200 mg total) by mouth daily.  . pregabalin (LYRICA) 100 MG capsule TAKE 1 CAPSULE (100 MG TOTAL) BY MOUTH 2 (TWO) TIMES DAILY.  Marland Kitchen Semaglutide,0.25 or 0.5MG /DOS, (OZEMPIC, 0.25 OR 0.5 MG/DOSE,) 2 MG/1.5ML SOPN Inject 0.25 mg into the skin once a week.  . solifenacin (VESICARE) 10 MG tablet Take 1 tablet (10 mg total) by mouth daily.  . Vilazodone HCl (VIIBRYD) 10 MG TABS TAKE 1 TABLET (10 MG TOTAL) BY MOUTH DAILY.  Marland Kitchen Vitamin D, Ergocalciferol, (DRISDOL) 1.25 MG (50000 UNIT) CAPS capsule Take 1 capsule (50,000 Units total) by mouth every 7 (seven) days.  . [DISCONTINUED]  citalopram (CELEXA) 40 MG tablet Take 1 tablet (40 mg total) by mouth daily.  . [DISCONTINUED] lamoTRIgine (LAMICTAL) 200 MG tablet Take 1 tablet (200 mg total) by mouth daily.  . [DISCONTINUED] Vilazodone HCl (VIIBRYD) 10 MG TABS TAKE 1 TABLET (10 MG TOTAL) BY MOUTH DAILY.   No facility-administered encounter medications on file as of 12/11/2020.     Family History; Family History  Problem Relation Age of Onset  . Hyperlipidemia Mother   . Sleep apnea Mother   . Depression Mother   . Diabetes Father   . Hypertension Father   . COPD Father   . Diabetes Paternal Aunt   . Diabetes Paternal Uncle   . Alcohol abuse Paternal Uncle   . Leukemia Paternal Uncle   . Alcohol abuse Paternal Grandfather   . Depression Sister       Labs:  Recent Results (from the past 2160 hour(s))  Fe+TIBC+Fer     Status: Abnormal   Collection Time: 11/27/20 10:23 AM  Result Value Ref Range   Iron 78 45 - 160 mcg/dL   TIBC 454 (H) 250 - 450 mcg/dL (calc)   %SAT 17 16 - 45 % (calc)   Ferritin 12 (L) 16 - 232 ng/mL  COMPLETE METABOLIC PANEL WITH GFR     Status: Abnormal   Collection Time: 11/27/20 10:23 AM  Result Value Ref Range   Glucose, Bld 241 (H) 65 - 139 mg/dL    Comment: .        Non-fasting reference interval .    BUN 17 7 - 25 mg/dL   Creat 0.82 0.50 - 1.05 mg/dL    Comment: For patients >71 years of age, the reference limit for Creatinine is approximately 13% higher for people identified as African-American. .    GFR, Est Non African American 82 > OR = 60 mL/min/1.67m2   GFR, Est African American 95 > OR = 60 mL/min/1.31m2   BUN/Creatinine Ratio NOT APPLICABLE 6 - 22 (calc)   Sodium 141 135 - 146 mmol/L   Potassium 4.3 3.5 - 5.3 mmol/L   Chloride 104 98 - 110 mmol/L   CO2 26 20 - 32 mmol/L   Calcium 9.5 8.6 - 10.4 mg/dL   Total Protein 7.2 6.1 - 8.1 g/dL   Albumin 4.4 3.6 - 5.1 g/dL   Globulin 2.8 1.9 - 3.7 g/dL (calc)   AG Ratio 1.6 1.0 - 2.5 (calc)   Total Bilirubin 0.4  0.2 - 1.2 mg/dL   Alkaline phosphatase (APISO) 95 37 - 153 U/L   AST 38 (H) 10 - 35 U/L   ALT 39 (H) 6 - 29 U/L  B12 and Folate Panel     Status: None   Collection Time: 11/27/20 10:23 AM  Result Value Ref Range   Vitamin B-12 480 200 - 1,100 pg/mL  Folate 18.1 ng/mL    Comment:                            Reference Range                            Low:           <3.4                            Borderline:    3.4-5.4                            Normal:        >5.4 .   VITAMIN D 25 Hydroxy (Vit-D Deficiency, Fractures)     Status: Abnormal   Collection Time: 11/27/20 10:23 AM  Result Value Ref Range   Vit D, 25-Hydroxy 13 (L) 30 - 100 ng/mL    Comment: Vitamin D Status         25-OH Vitamin D: . Deficiency:                    <20 ng/mL Insufficiency:             20 - 29 ng/mL Optimal:                 > or = 30 ng/mL . For 25-OH Vitamin D testing on patients on  D2-supplementation and patients for whom quantitation  of D2 and D3 fractions is required, the [employer]ure[facility](TM) 25-OH VIT D, (D2,D3), LC/MS/MS is recommended: order  code [id number] (patients >19yrs). See Note 1 . Note 1 . For additional information, please refer to  http://education.Quest[facility].com/faq/FAQ199  (This link is being provided for informational/ educational purposes only.)   TSH     Status: None   Collection Time: 11/27/20 10:23 AM  Result Value Ref Range   TSH 1.54 mIU/L    Comment:           Reference Range .           > or = 20 Years  0.40-4.50 .                Pregnancy Ranges           First trimester    0.26-2.66           Second trimester   0.55-2.73           Third trimester    0.43-2.91   POCT glycosylated hemoglobin (Hb A1C)     Status: Abnormal   Collection Time: 11/27/20 10:34 AM  Result Value Ref Range   Hemoglobin A1C 7.7 (A) 4.0 - 5.6 %   HbA1c POC (<> result, manual entry)     HbA1c, POC (prediabetic range)     HbA1c, POC (controlled diabetic range)    POCT UA - Microalbumin      Status: Abnormal   Collection Time: 11/27/20 10:35 AM  Result Value Ref Range   Microalbumin Ur, POC 80 mg/L   Creatinine, POC 300 mg/dL   Albumin/Creatinine Ratio, Urine, POC 30-300   POCT URINALYSIS DIP (CLINITEK)     Status: Abnormal   Collection Time: 11/27/20 10:36 AM  Result Value Ref Range   Color, UA yellow yellow   Clarity, UA clear clear   Glucose, UA =100 (A)  negative mg/dL   Bilirubin, UA negative negative   Ketones, POC UA negative negative mg/dL   Spec Grav, UA >=1.030 (A) 1.010 - 1.025   Blood, UA trace-lysed (A) negative   pH, UA 6.0 5.0 - 8.0   POC PROTEIN,UA trace negative, trace   Urobilinogen, UA 0.2 0.2 or 1.0 E.U./dL   Nitrite, UA Negative Negative   Leukocytes, UA Moderate (2+) (A) Negative  Urine Culture     Status: Abnormal   Collection Time: 11/27/20 10:37 AM   Specimen: Urine  Result Value Ref Range   MICRO NUMBER: [id number]    SPECIMEN QUALITY: Adequate    Sample Source NOT GIVEN    STATUS: FINAL    ISOLATE 1: Streptococcus agalactiae (A)     Comment: 50,000-100,000 CFU/mL of Mixed genital flora isolated. These superficial bacteria are not indicative of a urinary tract infection. No further organism identification is warranted on this specimen. If clinically indicated, recollect clean-catch,  mid-stream urine and transfer immediately to Urine Culture Transport Tube. Group B Streptococcus isolated Beta-hemolytic streptococci are predictably susceptible to Penicillin and other beta-lactams. Susceptibility testing not routinely performed. Please  contact the laboratory within 3 days if susceptibility testing is desired. Erythromycin and clindamycin are not recommended for treatment of urinary tract infections, but clindamycin may be useful for treatment in penicillin allergic patients for  rectovaginal colonization or for intrapartum prophylaxis if indicated. Any amount of group B Streptococcus in urine specimens obtained from pregnant females is a marker  of genital tract colonization. If this patient is pregnant, please refer to [facility]  guidelines for appropriate screening and management of pregnant women.   Cervicovaginal ancillary only( [place])     Status: None   Collection Time: 11/28/20  9:41 AM  Result Value Ref Range   Bacterial Vaginitis (gardnerella) Negative    Candida Vaginitis Negative    Candida Glabrata Negative    Comment      Normal Reference Range Bacterial Vaginosis - Negative   Comment Normal Reference Range Candida Species - Negative    Comment Normal Reference Range Candida Galbrata - Negative   Cytology - PAP( [place])     Status: None   Collection Time: 11/28/20  9:42 AM  Result Value Ref Range   High risk HPV Negative    Adequacy      Satisfactory for evaluation; transformation zone component PRESENT.   Diagnosis      - Negative for intraepithelial lesion or malignancy (NILM)   Comment Normal Reference Range HPV - Negative         Mental Status Examination;   Psychiatric Specialty Exam: Physical Exam  Review of Systems  Cardiovascular: Negative for palpitations.  Skin: Negative for itching.  Psychiatric/Behavioral: Negative for hallucinations.    There were no vitals taken for this visit.There is no height or weight on file to calculate BMI.  General Appearance:  Eye Contact::    Speech:  Normal Rate  Volume:  Normal  Mood : fair  Affect:  Congruent   Thought Process:  Coherent  Orientation:  Full (Time, Place, and Person)  Thought Content:  Rumination  Suicidal Thoughts:  No  Homicidal Thoughts:  No  Memory:  Immediate;   Fair Recent;   Fair  Judgement:  Fair  Insight:  Shallow  Psychomotor Activity:  Normal  Concentration:  Fair  Recall:  Fair  Akathisia:  Negative  Handed:  Right  AIMS (if indicated):     Assets:  Communication Skills Desire  for Improvement Financial Resources/Insurance Housing  Sleep:        Assessment: Axis I: mood disorder unspecified. Rule out  mood disorder secondary to general medical condition. Major depressive disorder recurrent moderate. Rule out panic disorder. Generalized anxiety disorder   Axis III:  Past Medical History:  Diagnosis Date  . Anxiety   . Arthritis    back and hips   . Depression   . Diabetes mellitus type II, controlled ([place]) 08/15/2014  . GERD (gastroesophageal reflux disease)   . HPV (human papilloma virus) infection   . Hyperlipidemia   . Lateral meniscus tear    left knee  . PONV (postoperative nausea and vomiting)    slow to wake up   . Pre-diabetes   . Shortness of breath dyspnea    with exertion   . Sleep apnea    cpap -0 setting at 16, uses CPAP nightly  . Stress incontinence     Axis IV: psychosocial   Treatment Plan and Summary: Prior documentation reviewed   Depression: job stress effects mood, she may look fo therapist, she feels meds are doing what they can , continue current meds, work on distraction from negative toughts meds refilled, celxa, viibryd, lamictal Anxiety : fluctuates , see above   Panic attacks :sporadic, can take xanan prn meds refilled except xanax  Fu 2-m.  , Shannon Capron, MD 12/11/2020

## 2020-12-12 ENCOUNTER — Telehealth: Payer: Self-pay

## 2020-12-12 DIAGNOSIS — B379 Candidiasis, unspecified: Secondary | ICD-10-CM

## 2020-12-12 MED ORDER — FLUCONAZOLE 150 MG PO TABS: 150.0000 mg | ORAL_TABLET | Freq: Every day | ORAL | 0 refills | Status: DC

## 2020-12-12 NOTE — Telephone Encounter (Addendum)
Pt is aware one refill of Diflucan has been sent into her pharmacy. Pt states if she does not see improvement after taking this Diflucan then she will call office to schedule appt.   ----- Message from Lezlie Lye, NP sent at 12/12/2020 11:44 AM EST ----- Regarding: RE: Diflucan Refill One additional refill is fine. If it continues after that please have her come in. It may be a dryness problem. Thank you! ----- Message ----- From: Lyndal Rainbow, CMA Sent: 12/12/2020  11:42 AM EST To: Lezlie Lye, NP Subject: Diflucan Refill                                Charyl Dancer,  You sent this pt Diflucan on 2/9 and then her swab came back negative. She called today saying she is still having vaginal itching. Do you want [place] to send her a refill or does she need to come in?  Thanks!

## 2020-12-13 NOTE — Telephone Encounter (Signed)
Will call patient to discuss

## 2020-12-13 NOTE — Telephone Encounter (Signed)
Pt would like to know of any updates regarding gel inj

## 2020-12-14 ENCOUNTER — Telehealth: Payer: Self-pay

## 2020-12-14 NOTE — Telephone Encounter (Signed)
Submitted VOB for Monovisc, right knee. Pending BV 

## 2020-12-17 ENCOUNTER — Telehealth: Payer: Self-pay

## 2020-12-17 NOTE — Telephone Encounter (Signed)
PA required for Monovisc, right knee. Talked with Ana P.at [facility] and initiated PA for Monovisc, right knee. Per Ana P.office notes need to be faxed to [phone]. Pending PA# [phone]  Faxed Office note to [phone] Called and left a Vm updating patient on gel injection.

## 2020-12-18 ENCOUNTER — Other Ambulatory Visit: Payer: Self-pay | Admitting: Sports Medicine

## 2020-12-18 DIAGNOSIS — M7918 Myalgia, other site: Secondary | ICD-10-CM

## 2020-12-19 MED FILL — PREGABALIN 100 MG CAPS: 100 | 30 days supply | Qty: 60 | Fill #0

## 2020-12-20 ENCOUNTER — Ambulatory Visit: Payer: 59 | Admitting: Obstetrics and Gynecology

## 2020-12-24 ENCOUNTER — Telehealth: Payer: Self-pay | Admitting: Orthopaedic Surgery

## 2020-12-24 ENCOUNTER — Telehealth: Payer: Self-pay

## 2020-12-24 NOTE — Telephone Encounter (Signed)
Noted! Thank you

## 2020-12-24 NOTE — Telephone Encounter (Signed)
Received a call form [facility] stating that Monovisc was not a preferred product and that Durolane, Gelsyn-3, and Euflexxa are the preferred products.  Advised [facility] rep that patient did try Durolane, but it did not work/help the patient.  [facility] rep stated that she would update the notes and PA will be reviewed.   PA still Pending for Monovisc.

## 2020-12-24 NOTE — Telephone Encounter (Signed)
Patient called concerning being approved for the gel injection. I read message from April to patient and patient said she will check back in a couple days on pending approval for Monovisc. The number to contact patient if needed is [phone]

## 2020-12-27 ENCOUNTER — Other Ambulatory Visit (HOSPITAL_COMMUNITY): Payer: Self-pay | Admitting: Psychiatry

## 2020-12-27 ENCOUNTER — Telehealth (HOSPITAL_COMMUNITY): Payer: Self-pay

## 2020-12-27 MED ORDER — ALPRAZOLAM 0.5 MG PO TABS: 0.5000 mg | ORAL_TABLET | Freq: Every day | ORAL | 0 refills | Status: DC | PRN

## 2020-12-27 MED FILL — ALPRAZolam 0.5 MG TABS: 0.5 | 30 days supply | Qty: 30 | Fill #0

## 2020-12-27 NOTE — Telephone Encounter (Signed)
Patient needs a refill on Xanax sent to [facility]

## 2020-12-27 NOTE — Telephone Encounter (Signed)
sent 

## 2021-01-01 ENCOUNTER — Telehealth: Payer: Self-pay

## 2021-01-01 NOTE — Telephone Encounter (Signed)
Called and left a VM advising patient that she was denied for Monovisc due to not trying all preferred products, which are Durolane, Euflexxa, and Gelsyn-3. Advised patient to give me a call back if she would like to proceed with Euflexxa or Gelsyn-3 , being that she has tried Durolane and it did not work.

## 2021-01-03 ENCOUNTER — Telehealth: Payer: Self-pay | Admitting: Orthopaedic Surgery

## 2021-01-03 NOTE — Telephone Encounter (Signed)
Patient called. She would like to try the other gel injection that April spoke about. Her call back number is [phone]

## 2021-01-03 NOTE — Telephone Encounter (Signed)
Talked with patient concerning a different gel injection.  Submitted for Euflexxa, right knee.

## 2021-01-10 ENCOUNTER — Telehealth: Payer: Self-pay

## 2021-01-10 NOTE — Telephone Encounter (Signed)
Called and left a VM advising patient to call back to schedule an appointment with Dr. [name] for gel injection.  Approved for Euflexxa series, right knee. [place] has been met Covered at 100% of the allowable amount  Co-pay of $60.00 No PA required

## 2021-01-22 ENCOUNTER — Other Ambulatory Visit (HOSPITAL_BASED_OUTPATIENT_CLINIC_OR_DEPARTMENT_OTHER): Payer: Self-pay

## 2021-01-22 ENCOUNTER — Telehealth: Payer: Self-pay | Admitting: Neurology

## 2021-01-22 MED FILL — Pregabalin Cap 100 MG: ORAL | 30 days supply | Qty: 60 | Fill #0 | Status: AC

## 2021-01-22 MED FILL — Lamotrigine Tab 200 MG: ORAL | 30 days supply | Qty: 30 | Fill #0 | Status: AC

## 2021-01-22 MED FILL — Citalopram Hydrobromide Tab 40 MG (Base Equiv): ORAL | 30 days supply | Qty: 30 | Fill #0 | Status: AC

## 2021-01-22 MED FILL — Vilazodone HCl Tab 10 MG: ORAL | 30 days supply | Qty: 30 | Fill #0 | Status: AC

## 2021-01-22 NOTE — Telephone Encounter (Signed)
Spoke with patient. She is currently taking 0.25 mg weekly, she is okay with increase. What dose do you want her to take?

## 2021-01-22 NOTE — Telephone Encounter (Signed)
Increase to .5mg  weekly.

## 2021-01-22 NOTE — Telephone Encounter (Signed)
Patient left vm stating the Ozempic is actually increasing her appetite, wants to know if there is anything else she should be doing?

## 2021-01-22 NOTE — Telephone Encounter (Signed)
Patient made aware, she will call with any problems.

## 2021-01-22 NOTE — Telephone Encounter (Signed)
What dose are you taking? Maybe we could go up on dosage and see if it would help.

## 2021-01-23 ENCOUNTER — Ambulatory Visit: Payer: 59 | Admitting: Orthopaedic Surgery

## 2021-01-23 ENCOUNTER — Encounter: Payer: Self-pay | Admitting: Orthopaedic Surgery

## 2021-01-23 ENCOUNTER — Other Ambulatory Visit (HOSPITAL_BASED_OUTPATIENT_CLINIC_OR_DEPARTMENT_OTHER): Payer: Self-pay

## 2021-01-23 DIAGNOSIS — M1711 Unilateral primary osteoarthritis, right knee: Secondary | ICD-10-CM | POA: Diagnosis not present

## 2021-01-23 MED ORDER — SODIUM HYALURONATE (VISCOSUP) 20 MG/2ML IX SOSY
20.0000 mg | PREFILLED_SYRINGE | INTRA_ARTICULAR | Status: AC | PRN
  Administered 2021-01-23: 20 mg via INTRA_ARTICULAR

## 2021-01-23 NOTE — Progress Notes (Signed)
   Procedure Note  Patient: Shannon [name]             Date of Birth: 1966/01/17           MRN: [id number]             Visit Date: 01/23/2021  Procedures: Visit Diagnoses:  1. Primary osteoarthritis of right knee     Large Joint Inj: R knee on 01/23/2021 9:35 AM Indications: diagnostic evaluation and pain Details: 22 G 1.5 in needle, superolateral approach  Arthrogram: No  Medications: 20 mg Sodium Hyaluronate 20 MG/2ML Outcome: tolerated well, no immediate complications Procedure, treatment alternatives, risks and benefits explained, specific risks discussed. Consent was given by the patient. Immediately prior to procedure a time out was called to verify the correct patient, procedure, equipment, support staff and site/side marked as required. Patient was prepped and draped in the usual sterile fashion.    Comes in today for hyaluronic acid injection in her right knee with Euflexxa.  This is injection #1 of a series of 3 injections.  This is to treat the pain from osteoarthritis.  She has tried failed other forms of conservative treatment measures including steroid injections.  She understands fully why we are trying an injection such as this.  She did tolerate the injection well on the right knee without difficulty.  We will see her back next week for injection #2 of the series of 3 injections.

## 2021-01-30 ENCOUNTER — Encounter: Payer: Self-pay | Admitting: Orthopaedic Surgery

## 2021-01-30 ENCOUNTER — Ambulatory Visit: Payer: 59 | Admitting: Orthopaedic Surgery

## 2021-01-30 DIAGNOSIS — M1711 Unilateral primary osteoarthritis, right knee: Secondary | ICD-10-CM

## 2021-01-30 MED ORDER — HYALURONAN 88 MG/4ML IX SOSY
88.0000 mg | PREFILLED_SYRINGE | INTRA_ARTICULAR | Status: AC | PRN
  Administered 2021-01-30: 88 mg via INTRA_ARTICULAR

## 2021-01-30 NOTE — Progress Notes (Signed)
   Procedure Note  Patient: Shannon [name]             Date of Birth: [birth year]           MRN: [id number]             Visit Date: 01/30/2021 HPI: Ms. Misenheimer returns today for second Euflexxa injection L3.  She states that the first injection caused her has significant pain for several days afterwards.  She is otherwise had no reaction to the injection.  Physical exam: Right knee no abnormal warmth erythema or effusion. Procedures: Visit Diagnoses:  1. Primary osteoarthritis of right knee     Large Joint Inj on 01/30/2021 10:25 AM Indications: pain Details: 22 G 1.5 in needle, anterolateral approach  Arthrogram: No  Medications: 88 mg Hyaluronan 88 MG/4ML Outcome: tolerated well, no immediate complications Procedure, treatment alternatives, risks and benefits explained, specific risks discussed. Consent was given by the patient. Immediately prior to procedure a time out was called to verify the correct patient, procedure, equipment, support staff and site/side marked as required. Patient was prepped and draped in the usual sterile fashion.     Plan: We tried injecting the knee from anterior to lateral instead of from the superior lateral approach.  She had significant discomfort with this also.  She will ice the knee this evening and slowly advance her activities.  We will see her back in 1 week for third Euflexxa injection.

## 2021-01-31 ENCOUNTER — Other Ambulatory Visit (HOSPITAL_COMMUNITY): Payer: Self-pay | Admitting: Psychiatry

## 2021-01-31 ENCOUNTER — Other Ambulatory Visit (HOSPITAL_BASED_OUTPATIENT_CLINIC_OR_DEPARTMENT_OTHER): Payer: Self-pay

## 2021-01-31 MED ORDER — ALPRAZOLAM 0.5 MG PO TABS
ORAL_TABLET | ORAL | 0 refills | Status: DC
  Filled 2021-01-31: qty 30, 30d supply, fill #0

## 2021-02-05 ENCOUNTER — Telehealth (INDEPENDENT_AMBULATORY_CARE_PROVIDER_SITE_OTHER): Payer: 59 | Admitting: Psychiatry

## 2021-02-05 ENCOUNTER — Encounter (HOSPITAL_COMMUNITY): Payer: Self-pay | Admitting: Psychiatry

## 2021-02-05 ENCOUNTER — Other Ambulatory Visit (HOSPITAL_BASED_OUTPATIENT_CLINIC_OR_DEPARTMENT_OTHER): Payer: Self-pay

## 2021-02-05 DIAGNOSIS — F411 Generalized anxiety disorder: Secondary | ICD-10-CM | POA: Diagnosis not present

## 2021-02-05 DIAGNOSIS — F331 Major depressive disorder, recurrent, moderate: Secondary | ICD-10-CM | POA: Diagnosis not present

## 2021-02-05 MED ORDER — CITALOPRAM HYDROBROMIDE 40 MG PO TABS
ORAL_TABLET | Freq: Every day | ORAL | 2 refills | Status: DC
Start: 2021-02-05 — End: 2021-06-04
  Filled 2021-02-05: qty 30, fill #0
  Filled 2021-02-19: qty 30, 30d supply, fill #0
  Filled 2021-03-28 – 2021-04-05 (×2): qty 30, 30d supply, fill #1
  Filled 2021-05-08: qty 30, 30d supply, fill #2

## 2021-02-05 MED ORDER — LAMOTRIGINE 200 MG PO TABS
ORAL_TABLET | Freq: Every day | ORAL | 2 refills | Status: DC
  Filled 2021-02-05: qty 30, fill #0
  Filled 2021-02-19: qty 30, 30d supply, fill #0
  Filled 2021-03-28 – 2021-04-05 (×2): qty 30, 30d supply, fill #1
  Filled 2021-05-08: qty 30, 30d supply, fill #2

## 2021-02-05 NOTE — Progress Notes (Signed)
Patient ID: Shannon [name], female   DOB: [birth year], 55 y.o.   MRN: [id number]  [facility] visit  TAYLORANN TKACH [id number] 55 y.o.  02/05/2021 10:42 AM  Chief Complaint:  Depression follow up   History of Present Illness:   Virtual Visit via Telephone Note  I connected with Shannon [name] on 02/05/21 at 10:30 AM EDT by telephone and verified that I am speaking with the correct person using two identifiers.  Location: Patient: home Provider: office   I discussed the limitations, risks, security and privacy concerns of performing an evaluation and management service by telephone and the availability of in person appointments. I also discussed with the patient that there may be a patient responsible charge related to this service. The patient expressed understanding and agreed to proceed.     I discussed the assessment and treatment plan with the patient. The patient was provided an opportunity to ask questions and all were answered. The patient agreed with the plan and demonstrated an understanding of the instructions.   The patient was advised to call back or seek an in-person evaluation if the symptoms worsen or if the condition fails to improve as anticipated.  I provided 12 minutes of non-face-to-face time during this encounter including documentation.     HPI: doing better, job stress some better works as Biomedical engineer    Have to take xanax prn for anxiety Mood fair  Modifying factor: mom, craft work,son  No side effects.   Duration  5 plus years    Past Psychiatric History/Hospitalization(s) Manged for depression and mood symptoms for more then 20 years.  Prior Suicide Attempts: No  Medical History; Past Medical History:  Diagnosis Date  . Anxiety   . Arthritis    back and hips   . Depression   . Diabetes mellitus type II, controlled ([place]) 08/15/2014  . GERD (gastroesophageal reflux disease)   . HPV (human papilloma virus)  infection   . Hyperlipidemia   . Lateral meniscus tear    left knee  . PONV (postoperative nausea and vomiting)    slow to wake up   . Pre-diabetes   . Shortness of breath dyspnea    with exertion   . Sleep apnea    cpap -0 setting at 16, uses CPAP nightly  . Stress incontinence     Allergies: Allergies  Allergen Reactions  . Food Swelling    Big Red Gum makes her tongue swell  . Latuda [Lurasidone Hcl] Other (See Comments)    Severe aggitation  . Lipitor [Atorvastatin] Other (See Comments)    Muscle aches  . Belviq [Lorcaserin Hcl]     Increased appetitie  . Contrave [Naltrexone-Bupropion Hcl Er] Other (See Comments)    Sleepy.    Medications: Outpatient Encounter Medications as of 02/05/2021  Medication Sig  . ALPRAZolam (XANAX) 0.5 MG tablet TAKE 1 TABLET (0.5 MG TOTAL) BY MOUTH DAILY AS NEEDED. FOR ANXIETY  . citalopram (CELEXA) 40 MG tablet TAKE 1 TABLET (40 MG TOTAL) BY MOUTH DAILY.  . clotrimazole-betamethasone (LOTRISONE) cream APPLY 1 APPLICATION TOPICALLY 2 (TWO) TIMES DAILY. FOR EXTERNAL USE.  . cyclobenzaprine (FLEXERIL) 10 MG tablet TAKE 1 TABLET BY MOUTH TWICE DAILY AS NEEDED  . fluconazole (DIFLUCAN) 150 MG tablet Take 1 tablet (150 mg total) by mouth daily. Take one dose today and repeat in 3 days.  Marland Kitchen lamoTRIgine (LAMICTAL) 200 MG tablet TAKE 1 TABLET (200 MG TOTAL) BY MOUTH DAILY.  Marland Kitchen  pregabalin (LYRICA) 100 MG capsule TAKE 1 CAPSULE (100 MG TOTAL) BY MOUTH 2 (TWO) TIMES DAILY.  Marland Kitchen Semaglutide,0.25 or 0.5MG /DOS, 2 MG/1.5ML SOPN INJECT 0.25 MG INTO THE SKIN ONCE A WEEK.  . solifenacin (VESICARE) 10 MG tablet Take 1 tablet (10 mg total) by mouth daily.  . Vilazodone HCl (VIIBRYD) 10 MG TABS TAKE 1 TABLET (10 MG TOTAL) BY MOUTH DAILY.  Marland Kitchen Vitamin D, Ergocalciferol, (DRISDOL) 1.25 MG (50000 UNIT) CAPS capsule TAKE 1 CAPSULE (50,000 UNITS TOTAL) BY MOUTH EVERY 7 (SEVEN) DAYS.  . [DISCONTINUED] citalopram (CELEXA) 40 MG tablet TAKE 1 TABLET (40 MG TOTAL) BY MOUTH  DAILY.  . [DISCONTINUED] lamoTRIgine (LAMICTAL) 200 MG tablet TAKE 1 TABLET (200 MG TOTAL) BY MOUTH DAILY.   No facility-administered encounter medications on file as of 02/05/2021.     Family History; Family History  Problem Relation Age of Onset  . Hyperlipidemia Mother   . Sleep apnea Mother   . Depression Mother   . Diabetes Father   . Hypertension Father   . COPD Father   . Diabetes Paternal Aunt   . Diabetes Paternal Uncle   . Alcohol abuse Paternal Uncle   . Leukemia Paternal Uncle   . Alcohol abuse Paternal Grandfather   . Depression Sister       Labs:  Recent Results (from the past 2160 hour(s))  Fe+TIBC+Fer     Status: Abnormal   Collection Time: 11/27/20 10:23 AM  Result Value Ref Range   Iron 78 45 - 160 mcg/dL   TIBC 454 (H) 250 - 450 mcg/dL (calc)   %SAT 17 16 - 45 % (calc)   Ferritin 12 (L) 16 - 232 ng/mL  COMPLETE METABOLIC PANEL WITH GFR     Status: Abnormal   Collection Time: 11/27/20 10:23 AM  Result Value Ref Range   Glucose, Bld 241 (H) 65 - 139 mg/dL    Comment: .        Non-fasting reference interval .    BUN 17 7 - 25 mg/dL   Creat 0.82 0.50 - 1.05 mg/dL    Comment: For patients >51 years of age, the reference limit for Creatinine is approximately 13% higher for people identified as African-American. .    GFR, Est Non African American 82 > OR = 60 mL/min/1.57m2   GFR, Est African American 95 > OR = 60 mL/min/1.62m2   BUN/Creatinine Ratio NOT APPLICABLE 6 - 22 (calc)   Sodium 141 135 - 146 mmol/L   Potassium 4.3 3.5 - 5.3 mmol/L   Chloride 104 98 - 110 mmol/L   CO2 26 20 - 32 mmol/L   Calcium 9.5 8.6 - 10.4 mg/dL   Total Protein 7.2 6.1 - 8.1 g/dL   Albumin 4.4 3.6 - 5.1 g/dL   Globulin 2.8 1.9 - 3.7 g/dL (calc)   AG Ratio 1.6 1.0 - 2.5 (calc)   Total Bilirubin 0.4 0.2 - 1.2 mg/dL   Alkaline phosphatase (APISO) 95 37 - 153 U/L   AST 38 (H) 10 - 35 U/L   ALT 39 (H) 6 - 29 U/L  B12 and Folate Panel     Status: None   Collection  Time: 11/27/20 10:23 AM  Result Value Ref Range   Vitamin B-12 480 200 - 1,100 pg/mL   Folate 18.1 ng/mL    Comment:                            Reference Range  Low:           <3.4                            Borderline:    3.4-5.4                            Normal:        >5.4 .   VITAMIN D 25 Hydroxy (Vit-D Deficiency, Fractures)     Status: Abnormal   Collection Time: 11/27/20 10:23 AM  Result Value Ref Range   Vit D, 25-Hydroxy 13 (L) 30 - 100 ng/mL    Comment: Vitamin D Status         25-OH Vitamin D: . Deficiency:                    <20 ng/mL Insufficiency:             20 - 29 ng/mL Optimal:                 > or = 30 ng/mL . For 25-OH Vitamin D testing on patients on  D2-supplementation and patients for whom quantitation  of D2 and D3 fractions is required, the [facility](TM) 25-OH VIT D, (D2,D3), LC/MS/MS is recommended: order  code [id number] (patients >55yrs). See Note 1 . Note 1 . For additional information, please refer to  http://education.[facility].com/faq/FAQ199  (This link is being provided for informational/ educational purposes only.)   TSH     Status: None   Collection Time: 11/27/20 10:23 AM  Result Value Ref Range   TSH 1.54 mIU/L    Comment:           Reference Range .           > or = 20 Years  0.40-4.50 .                Pregnancy Ranges           First trimester    0.26-2.66           Second trimester   0.55-2.73           Third trimester    0.43-2.91   POCT glycosylated hemoglobin (Hb A1C)     Status: Abnormal   Collection Time: 11/27/20 10:34 AM  Result Value Ref Range   Hemoglobin A1C 7.7 (A) 4.0 - 5.6 %   HbA1c POC (<> result, manual entry)     HbA1c, POC (prediabetic range)     HbA1c, POC (controlled diabetic range)    POCT UA - Microalbumin     Status: Abnormal   Collection Time: 11/27/20 10:35 AM  Result Value Ref Range   Microalbumin Ur, POC 80 mg/L   Creatinine, POC 300 mg/dL   Albumin/Creatinine  Ratio, Urine, POC 30-300   POCT URINALYSIS DIP (CLINITEK)     Status: Abnormal   Collection Time: 11/27/20 10:36 AM  Result Value Ref Range   Color, UA yellow yellow   Clarity, UA clear clear   Glucose, UA =100 (A) negative mg/dL   Bilirubin, UA negative negative   Ketones, POC UA negative negative mg/dL   Spec Grav, UA >=1.030 (A) 1.010 - 1.025   Blood, UA trace-lysed (A) negative   pH, UA 6.0 5.0 - 8.0   POC PROTEIN,UA trace negative, trace   Urobilinogen, UA 0.2 0.2 or 1.0 E.U./dL   Nitrite, UA Negative Negative  Leukocytes, UA Moderate (2+) (A) Negative  Urine Culture     Status: Abnormal   Collection Time: 11/27/20 10:37 AM   Specimen: Urine  Result Value Ref Range   MICRO NUMBER: [id number]    SPECIMEN QUALITY: Adequate    Sample Source NOT GIVEN    STATUS: FINAL    ISOLATE 1: Streptococcus agalactiae (A)     Comment: 50,000-100,000 CFU/mL of Mixed genital flora isolated. These superficial bacteria are not indicative of a urinary tract infection. No further organism identification is warranted on this specimen. If clinically indicated, recollect clean-catch,  mid-stream urine and transfer immediately to Urine Culture Transport Tube. Group B Streptococcus isolated Beta-hemolytic streptococci are predictably susceptible to Penicillin and other beta-lactams. Susceptibility testing not routinely performed. Please  contact the laboratory within 3 days if susceptibility testing is desired. Erythromycin and clindamycin are not recommended for treatment of urinary tract infections, but clindamycin may be useful for treatment in penicillin allergic patients for  rectovaginal colonization or for intrapartum prophylaxis if indicated. Any amount of group B Streptococcus in urine specimens obtained from pregnant females is a marker of genital tract colonization. If this patient is pregnant, please refer to [facility]  guidelines for appropriate screening and management of pregnant women.    Cervicovaginal ancillary only( [facility])     Status: None   Collection Time: 11/28/20  9:41 AM  Result Value Ref Range   Bacterial Vaginitis (gardnerella) Negative    Candida Vaginitis Negative    Candida Glabrata Negative    Comment      Normal Reference Range Bacterial Vaginosis - Negative   Comment Normal Reference Range Candida Species - Negative    Comment Normal Reference Range Candida Galbrata - Negative   Cytology - PAP( [place])     Status: None   Collection Time: 11/28/20  9:42 AM  Result Value Ref Range   High risk HPV Negative    Adequacy      Satisfactory for evaluation; transformation zone component PRESENT.   Diagnosis      - Negative for intraepithelial lesion or malignancy (NILM)   Comment Normal Reference Range HPV - Negative         Mental Status Examination;   Psychiatric Specialty Exam: Physical Exam  Review of Systems  Psychiatric/Behavioral: Negative for depression and hallucinations.    There were no vitals taken for this visit.There is no height or weight on file to calculate BMI.  General Appearance:  Eye Contact::    Speech:  Normal Rate  Volume:  Normal  Mood : fair  Affect:  Congruent   Thought Process:  Coherent  Orientation:  Full (Time, Place, and Person)  Thought Content:  Rumination  Suicidal Thoughts:  No  Homicidal Thoughts:  No  Memory:  Immediate;   Fair Recent;   Fair  Judgement:  Fair  Insight:  Shallow  Psychomotor Activity:  Normal  Concentration:  Fair  Recall:  Fair  Akathisia:  Negative  Handed:  Right  AIMS (if indicated):     Assets:  Communication Skills Desire for Improvement Financial Resources/Insurance Housing  Sleep:        Assessment: Axis I: mood disorder unspecified. Rule out mood disorder secondary to general medical condition. Major depressive disorder recurrent moderate. Rule out panic disorder. Generalized anxiety disorder   Axis III:  Past Medical History:  Diagnosis Date  .  Anxiety   . Arthritis    back and hips   . Depression   .  Diabetes mellitus type II, controlled ([place]) 08/15/2014  . GERD (gastroesophageal reflux disease)   . HPV (human papilloma virus) infection   . Hyperlipidemia   . Lateral meniscus tear    left knee  . PONV (postoperative nausea and vomiting)    slow to wake up   . Pre-diabetes   . Shortness of breath dyspnea    with exertion   . Sleep apnea    cpap -0 setting at 16, uses CPAP nightly  . Stress incontinence     Axis IV: psychosocial   Treatment Plan and Summary: Prior documentation reviewed   Depression: fair, continue current meds, celexa, lamictal, vibryd. Anxiety : manageable , takes xanax prn , Panic attacks :sporadic, can take xanan prn meds refilled except xanax  Fu 49m.  , Lular Letson, MD 02/05/2021

## 2021-02-06 ENCOUNTER — Encounter: Payer: Self-pay | Admitting: Orthopaedic Surgery

## 2021-02-06 ENCOUNTER — Ambulatory Visit: Payer: 59 | Admitting: Orthopaedic Surgery

## 2021-02-06 DIAGNOSIS — M1711 Unilateral primary osteoarthritis, right knee: Secondary | ICD-10-CM | POA: Diagnosis not present

## 2021-02-06 MED ORDER — SODIUM HYALURONATE (VISCOSUP) 20 MG/2ML IX SOSY
20.0000 mg | PREFILLED_SYRINGE | INTRA_ARTICULAR | Status: AC | PRN
  Administered 2021-02-06: 20 mg via INTRA_ARTICULAR

## 2021-02-06 NOTE — Progress Notes (Signed)
   Procedure Note  Patient: Shannon [name]             Date of Birth: 1966/02/20           MRN: [id number]             Visit Date: 02/06/2021  Procedures: Visit Diagnoses:  1. Unilateral primary osteoarthritis, right knee     Large Joint Inj: R knee on 02/06/2021 8:35 AM Indications: diagnostic evaluation and pain Details: 22 G 1.5 in needle, superolateral approach  Arthrogram: No  Medications: 20 mg Sodium Hyaluronate 20 MG/2ML Outcome: tolerated well, no immediate complications Procedure, treatment alternatives, risks and benefits explained, specific risks discussed. Consent was given by the patient. Immediately prior to procedure a time out was called to verify the correct patient, procedure, equipment, support staff and site/side marked as required. Patient was prepped and draped in the usual sterile fashion.     The patient is here for injection #3 of a series of 3 hyaluronic acid injections with Euflexxa in her right knee to treat the pain from osteoarthritis.  She is only 55 years old.  Her left knee started to bother her some.  She cannot tell a difference with the first 2 injections yet in her right knee and I gave her reassurance that it is something we can take 6 to 8 weeks to build up in her knee.  She has had no adverse reactions either.  I placed injection 3 of Euflexxa in the right knee without difficulty.  We will see her back in 2 months for repeat evaluation.  I would like a weight and BMI calculation at that visit as well.  We can address her left knee at that point if needed.

## 2021-02-19 ENCOUNTER — Other Ambulatory Visit (HOSPITAL_COMMUNITY): Payer: Self-pay | Admitting: Psychiatry

## 2021-02-19 ENCOUNTER — Encounter: Payer: Self-pay | Admitting: Physician Assistant

## 2021-02-19 ENCOUNTER — Other Ambulatory Visit (HOSPITAL_BASED_OUTPATIENT_CLINIC_OR_DEPARTMENT_OTHER): Payer: Self-pay

## 2021-02-19 ENCOUNTER — Ambulatory Visit: Payer: 59 | Admitting: Physician Assistant

## 2021-02-19 ENCOUNTER — Other Ambulatory Visit: Payer: Self-pay

## 2021-02-19 VITALS — BP 133/68 | HR 96 | Ht 65.0 in | Wt 227.0 lb

## 2021-02-19 DIAGNOSIS — E1121 Type 2 diabetes mellitus with diabetic nephropathy: Secondary | ICD-10-CM | POA: Diagnosis not present

## 2021-02-19 DIAGNOSIS — E785 Hyperlipidemia, unspecified: Secondary | ICD-10-CM

## 2021-02-19 DIAGNOSIS — Z6837 Body mass index (BMI) 37.0-37.9, adult: Secondary | ICD-10-CM | POA: Diagnosis not present

## 2021-02-19 DIAGNOSIS — N3281 Overactive bladder: Secondary | ICD-10-CM | POA: Diagnosis not present

## 2021-02-19 DIAGNOSIS — G4733 Obstructive sleep apnea (adult) (pediatric): Secondary | ICD-10-CM | POA: Diagnosis not present

## 2021-02-19 DIAGNOSIS — E559 Vitamin D deficiency, unspecified: Secondary | ICD-10-CM

## 2021-02-19 LAB — POCT GLYCOSYLATED HEMOGLOBIN (HGB A1C): Hemoglobin A1C: 6.2 % — AB (ref 4.0–5.6)

## 2021-02-19 MED ORDER — VIIBRYD 10 MG PO TABS
ORAL_TABLET | Freq: Every day | ORAL | 1 refills | Status: DC
Start: 2021-02-19 — End: 2021-05-08
  Filled 2021-02-19 – 2021-03-07 (×2): qty 30, 30d supply, fill #0
  Filled 2021-03-28 – 2021-04-09 (×6): qty 30, 30d supply, fill #1

## 2021-02-19 MED ORDER — PITAVASTATIN CALCIUM 1 MG PO TABS
1.0000 mg | ORAL_TABLET | Freq: Every day | ORAL | 3 refills | Status: DC
  Filled 2021-02-19: qty 90, 90d supply, fill #0

## 2021-02-19 MED ORDER — MIRABEGRON ER 50 MG PO TB24
50.0000 mg | ORAL_TABLET | Freq: Every day | ORAL | 3 refills | Status: DC
  Filled 2021-02-19: qty 30, 30d supply, fill #0
  Filled 2021-03-21 – 2021-04-05 (×2): qty 30, 30d supply, fill #1
  Filled 2021-05-08: qty 30, 30d supply, fill #2
  Filled 2021-06-10: qty 30, 30d supply, fill #3
  Filled 2021-07-11: qty 30, 30d supply, fill #4
  Filled 2021-07-30 – 2021-08-05 (×2): qty 30, 30d supply, fill #5
  Filled 2021-09-10: qty 30, 30d supply, fill #6
  Filled 2021-10-04: qty 30, 30d supply, fill #7
  Filled 2021-11-01: qty 30, 30d supply, fill #8
  Filled 2021-12-05: qty 30, 30d supply, fill #9
  Filled 2022-01-09: qty 30, 30d supply, fill #10
  Filled 2022-02-11: qty 30, 30d supply, fill #11

## 2021-02-19 MED ORDER — OZEMPIC (1 MG/DOSE) 4 MG/3ML ~~LOC~~ SOPN
1.0000 mg | PEN_INJECTOR | SUBCUTANEOUS | 2 refills | Status: DC
  Filled 2021-02-19: qty 9, 84d supply, fill #0

## 2021-02-19 MED ORDER — VITAMIN D (ERGOCALCIFEROL) 1.25 MG (50000 UNIT) PO CAPS
ORAL_CAPSULE | ORAL | 0 refills | Status: DC
  Filled 2021-02-19: qty 12, 84d supply, fill #0

## 2021-02-19 MED FILL — Pregabalin Cap 100 MG: ORAL | 30 days supply | Qty: 60 | Fill #1 | Status: AC

## 2021-02-19 NOTE — Progress Notes (Signed)
Subjective:    Patient ID: Shannon [name], female    DOB: 1966/01/19, 55 y.o.   MRN: [id number]  HPI  Patient is a 55 year old obese female with type 2 diabetes and neuropathy, overactive bladder, hyperlipidemia, MDD, generalized anxiety disorder who presents to the clinic for 15-month follow-up.  Patient is not checking her sugars at home.  She is only on Ozempic.  She has lost 4 pounds over the past 3 months.  She denies any hypoglycemic events, open sores, wounds.  She denies any chest pain, palpitations, headaches or vision changes.  She is trying to stay active and overall watching her sugar and carb intake.  She is tolerating Ozempic very well.  She did have gastric sleeve surgery a few years ago and had started gaining weight back.  This is giving her on a better path of weight loss.  Vesicare does not seem to be working for her overactive bladder anymore.  She is having mixed urge and stress incontinence.  She works second shift and her accidents are interfering with her work.  She would like to try something else for OAB.  .. Active Ambulatory Problems    Diagnosis Date Noted  . Hyperlipidemia 11/22/2008  . Sleep apnea 02/05/2012  . Mood disorder ([place]) 02/05/2012  . OSA on CPAP 03/31/2014  . Abnormal weight gain 03/31/2014  . Class 2 severe obesity due to excess calories with serious comorbidity and body mass index (BMI) of 37.0 to 37.9 in adult ([place]) 03/31/2014  . Hypothyroidism 08/15/2014  . Type II diabetes mellitus with nephropathy ([place]) 08/15/2014  . DDD (degenerative disc disease), lumbar 08/28/2014  . Generalized anxiety disorder 03/16/2015  . Hiatal hernia 08/13/2015  . 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 Cervical myofascial pain syndrome 07/31/2016  . Fibroids 10/30/2016  . 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 Iron deficiency anemia secondary to inadequate dietary iron intake 03/25/2018  . Chest pain 05/09/2018  . BPPV (benign paroxysmal positional vertigo), right  05/09/2018  . Polyp of colon 05/21/2018  . Primary osteoarthritis of right knee 10/06/2018  . Acute lateral meniscus tear of right knee 03/17/2019  . Loss of balance 05/24/2019  . Vertigo 05/24/2019  . OAB (overactive bladder) 11/23/2019  . Fever 08/13/2020  . Fatigue 08/13/2020  . Family history of leukemia 08/14/2020  . SOB (shortness of breath) on exertion 08/14/2020  . Bilateral lower extremity edema 08/14/2020  . Elevated platelet count 08/14/2020  . Digital mucinous cyst of right thumb 10/23/2020  . Vitamin D deficiency 11/27/2020  . Unilateral primary osteoarthritis, right knee 02/06/2021   Resolved Ambulatory Problems    Diagnosis Date Noted  . Unspecified otitis media 11/22/2008  . Acute sinusitis, unspecified 11/22/2008  . URI 09/28/2009  . BACK PAIN, LUMBAR 12/26/2008  . Burning sensation of the foot 08/28/2014  . Numbness of both lower extremities 08/28/2014  . BMI 38.0-38.9,adult 12/29/2014  . Status post gastric surgery 11/12/2015   Past Medical History:  Diagnosis Date  . Anxiety   . Arthritis   . Depression   . Diabetes mellitus type II, controlled ([place]) 08/15/2014  . GERD (gastroesophageal reflux disease)   . HPV (human papilloma virus) infection   . Lateral meniscus tear   . PONV (postoperative nausea and vomiting)   . Pre-diabetes   . Shortness of breath dyspnea   . Stress incontinence      Review of Systems  All other systems reviewed and are negative.      Objective:  Physical Exam Vitals reviewed.  Constitutional:      Appearance: Normal appearance. She is obese.  Cardiovascular:     Rate and Rhythm: Normal rate and regular rhythm.     Pulses: Normal pulses.     Heart sounds: Normal heart sounds.  Pulmonary:     Effort: Pulmonary effort is normal.     Breath sounds: Normal breath sounds.  Musculoskeletal:     Right lower leg: No edema.     Left lower leg: No edema.  Neurological:     General: No focal deficit present.     Mental  Status: She is alert and oriented to person, place, and time.  Psychiatric:        Mood and Affect: Mood normal.     .. Results for orders placed or performed in visit on 02/19/21  POCT glycosylated hemoglobin (Hb A1C)  Result Value Ref Range   Hemoglobin A1C 6.2 (A) 4.0 - 5.6 %   HbA1c POC (<> result, manual entry)     HbA1c, POC (prediabetic range)     HbA1c, POC (controlled diabetic range)           Assessment & Plan:  Marland KitchenMarland KitchenAmy was seen today for diabetes.  Diagnoses and all orders for this visit:  Type II diabetes mellitus with nephropathy ([place]) -     POCT glycosylated hemoglobin (Hb A1C) -     Semaglutide, 1 MG/DOSE, (OZEMPIC, 1 MG/DOSE,) 4 MG/3ML SOPN; Inject 1 mg into the skin once a week.  Class 2 severe obesity due to excess calories with serious comorbidity and body mass index (BMI) of 37.0 to 37.9 in adult ([facility]) -     Semaglutide, 1 MG/DOSE, (OZEMPIC, 1 MG/DOSE,) 4 MG/3ML SOPN; Inject 1 mg into the skin once a week.  OAB (overactive bladder) -     mirabegron ER (MYRBETRIQ) 50 MG TB24 tablet; Take 1 tablet (50 mg total) by mouth daily.  Hyperlipidemia LDL goal <70 -     Pitavastatin Calcium 1 MG TABS; Take 1 tablet (1 mg total) by mouth daily.  Vitamin D deficiency -     Vitamin D, Ergocalciferol, (DRISDOL) 1.25 MG (50000 UNIT) CAPS capsule; TAKE 1 CAPSULE (50,000 UNITS TOTAL) BY MOUTH EVERY 7 (SEVEN) DAYS.   a1C to goal.  Continue same medications.  Continue to work on weight loss.  BP to goal.  Added livalo. Myalgias to lipitor.  Foot/eye exam UTD.  Covid/flu/pneumonia UTD.  Follow up in 15months.   Stop vesicare since not working. Added myrbetriq.   Marland Kitchen.Discussed low carb diet with 1500 calories and 80g of protein.  Exercising at least 150 minutes a week.  [employer] could be a Microbiologist.       Follow up in 3 months.

## 2021-02-20 ENCOUNTER — Other Ambulatory Visit (HOSPITAL_BASED_OUTPATIENT_CLINIC_OR_DEPARTMENT_OTHER): Payer: Self-pay

## 2021-02-21 ENCOUNTER — Other Ambulatory Visit (HOSPITAL_BASED_OUTPATIENT_CLINIC_OR_DEPARTMENT_OTHER): Payer: Self-pay

## 2021-02-26 ENCOUNTER — Other Ambulatory Visit (HOSPITAL_BASED_OUTPATIENT_CLINIC_OR_DEPARTMENT_OTHER): Payer: Self-pay

## 2021-02-26 ENCOUNTER — Ambulatory Visit: Payer: 59 | Admitting: Physician Assistant

## 2021-03-05 ENCOUNTER — Telehealth (HOSPITAL_COMMUNITY): Payer: Self-pay | Admitting: Psychiatry

## 2021-03-05 NOTE — Telephone Encounter (Signed)
Per pt Needs refill on xanax  [facility]

## 2021-03-06 ENCOUNTER — Other Ambulatory Visit (HOSPITAL_BASED_OUTPATIENT_CLINIC_OR_DEPARTMENT_OTHER): Payer: Self-pay

## 2021-03-06 MED ORDER — ALPRAZOLAM 0.5 MG PO TABS
ORAL_TABLET | ORAL | 0 refills | Status: DC
  Filled 2021-03-06: qty 30, 30d supply, fill #0

## 2021-03-06 NOTE — Telephone Encounter (Signed)
sent 

## 2021-03-07 ENCOUNTER — Other Ambulatory Visit (HOSPITAL_BASED_OUTPATIENT_CLINIC_OR_DEPARTMENT_OTHER): Payer: Self-pay

## 2021-03-21 ENCOUNTER — Other Ambulatory Visit (HOSPITAL_BASED_OUTPATIENT_CLINIC_OR_DEPARTMENT_OTHER): Payer: Self-pay

## 2021-03-25 ENCOUNTER — Ambulatory Visit (INDEPENDENT_AMBULATORY_CARE_PROVIDER_SITE_OTHER): Payer: 59

## 2021-03-25 ENCOUNTER — Ambulatory Visit: Payer: 59 | Admitting: Podiatry

## 2021-03-25 ENCOUNTER — Other Ambulatory Visit: Payer: Self-pay

## 2021-03-25 DIAGNOSIS — E1149 Type 2 diabetes mellitus with other diabetic neurological complication: Secondary | ICD-10-CM | POA: Diagnosis not present

## 2021-03-25 DIAGNOSIS — M2041 Other hammer toe(s) (acquired), right foot: Secondary | ICD-10-CM

## 2021-03-25 NOTE — Patient Instructions (Addendum)
Look at getting an extra depth or double depth shoe to help offload and accommodate the hammertoe  If was nice to meet you today. If you have any questions or any further concerns, please feel fee to give me a call. You can call our office at [phone] or please feel fee to send me a message through [place].     [email or web address]  Hammer Toe Hammer toe is a change in the shape, or a deformity, of the toe. The deformity causes the middle joint of the toe to stay bent. Hammer toe starts gradually. At first, the toe can be straightened. Then over time, the toe deformity becomes stiff, inflexible, and permanently bent. Hammer toe usually affects the second, third, or fourth toe. A hammer toe causes pain, especially when wearing shoes. Corns and calluses can result from the toe rubbing against the inside of the shoe. Early treatments to keep the toe straight may relieve pain. As the deformity of the toe becomes stiff and permanent, surgery may be needed to straighten the toe. What are the causes? This condition is caused by abnormal bending of the toe joint that is closest to your foot. Over time, the toe bending downward pulls on the muscles and connections (tendons) of the toe joint, making them weak and stiff. Wearing shoes that are too narrow in the toe box and do not allow toes to fully straighten can cause this condition. What increases the risk? You are more likely to develop this condition if you:  Are an older female.  Wear shoes that are too small, or wear high-heeled shoes that pinch your toes.  Have a second toe that is longer than your big toe (first toe).  Injure your foot or toe.  Have arthritis, or have a nerve or muscle disorder.  Have diabetes or a condition known as Charcot joint, which may cause you to walk abnormally.  Have a family history of hammer toe.  Are a Engineer, mining. What are the signs or symptoms? Pain and  deformity of the toe are the main symptoms of this condition. The pain is worse when wearing shoes, walking, or running. Other symptoms may include:  A thickened patch of skin, called a corn or callus, that forms over the top of the bent part of the toe or between the toes.  Redness and a burning feeling on the bent toe.  An open sore that forms on the top of the bent toe.  Not being able to straighten the affected toe.   How is this diagnosed? This condition is diagnosed based on your symptoms and a physical exam. During the exam, your health care provider will try to straighten your toe to see how stiff the deformity is. You may also have tests, such as:  A blood test to check for rheumatoid arthritis or diabetes.  An X-[place]erryberry to show how severe the toe deformity is. How is this treated? Treatment for this condition depends on whether the toe is flexible or deformed and no longer moveable. In less severe cases, a hammer toe can be straightened without surgery. These treatments include:  Taping the toe into a straightened position.  Using pads and cushions to protect the bent toe.  Wearing shoes that provide enough room for the toes.  Doing toe-stretching exercises at home.  Taking an NSAID, such as ibuprofen, to reduce pain and swelling.  Using special orthotics or insoles for pain relief and to improve walking. If these treatments do not help or  the toe has a severe deformity and cannot be straightened, surgery is the next option. The most common surgeries used to straighten a hammer toe include:  Arthroplasty or osteotomy. Part of the toe joint is reconstructed or removed, which allows the toe to straighten.  Fusion. Cartilage between the two bones of the joint is taken out, and the bones are fused together into one longer bone.  Implantation. Part of the bone is removed and replaced with an implant to allow the toe to move again.  Flexor tendon transfer. The tendons that curl  the toes down (flexor tendons) are repositioned. Follow these instructions at home:  Take over-the-counter and prescription medicines only as told by your health care provider.  Do toe-straightening and stretching exercises as told by your health care provider.  Keep all follow-up visits. This is important. How is this prevented?  Wear shoes that fit properly and give your toes enough room. Shoes should not cause pain.  Buy shoes at the end of the day to make sure they fit well, since your foot may swell during the day. Make sure they are comfortable before you buy them.  As you age, your shoe size might change, including the width. Measure both feet and buy shoes for the larger foot.  A shoe repair store might be able to stretch shoes that feel tight in spots.  Do not wear high-heeled shoes or shoes with pointed toes. Contact a health care provider if:  Your pain gets worse.  Your toe becomes red or swollen.  You develop an open sore on your toe. Summary  Hammer toe is a condition that gradually causes your toe to become bent and stiff.  Hammer toe can be treated by taping the toe into a straightened position and doing toe-stretching exercises. If these treatments do not help, surgery may be needed.  To prevent this condition, wear shoes that fit properly, give your toes enough room, and do not cause pain. This information is not intended to replace advice given to you by your health care provider. Make sure you discuss any questions you have with your health care provider. Document Revised: 01/12/2020 Document Reviewed: 01/12/2020 Elsevier Patient Education  2021 [place].    Diabetes Mellitus and [place] care is an important part of your health, especially when you have diabetes. Diabetes may cause you to have problems because of poor blood flow (circulation) to your feet and legs, which can cause your skin to:  Become thinner and drier.  Break more  easily.  Heal more slowly.  Peel and crack. You may also have nerve damage (neuropathy) in your legs and feet, causing decreased feeling in them. This means that you may not notice minor injuries to your feet that could lead to more serious problems. Noticing and addressing any potential problems early is the best way to prevent future foot problems. How to care for your feet Foot hygiene  Wash your feet daily with warm water and mild soap. Do not use hot water. Then, pat your feet and the areas between your toes until they are completely dry. Do not soak your feet as this can dry your skin.  Trim your toenails straight across. Do not dig under them or around the cuticle. File the edges of your nails with an emery board or nail file.  Apply a moisturizing lotion or petroleum jelly to the skin on your feet and to dry, brittle toenails. Use lotion that does not contain alcohol  and is unscented. Do not apply lotion between your toes.   Shoes and socks  Wear clean socks or stockings every day. Make sure they are not too tight. Do not wear knee-high stockings since they may decrease blood flow to your legs.  Wear shoes that fit properly and have enough cushioning. Always look in your shoes before you put them on to be sure there are no objects inside.  To break in new shoes, wear them for just a few hours a day. This prevents injuries on your feet. Wounds, scrapes, corns, and calluses  Check your feet daily for blisters, cuts, bruises, sores, and redness. If you cannot see the bottom of your feet, use a mirror or ask someone for help.  Do not cut corns or calluses or try to remove them with medicine.  If you find a minor scrape, cut, or break in the skin on your feet, keep it and the skin around it clean and dry. You may clean these areas with mild soap and water. Do not clean the area with peroxide, alcohol, or iodine.  If you have a wound, scrape, corn, or callus on your foot, look at it  several times a day to make sure it is healing and not infected. Check for: ? Redness, swelling, or pain. ? Fluid or blood. ? Warmth. ? Pus or a bad smell.   General tips  Do not cross your legs. This may decrease blood flow to your feet.  Do not use heating pads or hot water bottles on your feet. They may burn your skin. If you have lost feeling in your feet or legs, you may not know this is happening until it is too late.  Protect your feet from hot and cold by wearing shoes, such as at the beach or on hot pavement.  Schedule a complete foot exam at least once a year (annually) or more often if you have foot problems. Report any cuts, sores, or bruises to your health care provider immediately. Where to find more information  American Diabetes Association: www.diabetes.org  Association of Diabetes Care & Education Specialists: www.diabeteseducator.org Contact a health care provider if:  You have a medical condition that increases your risk of infection and you have any cuts, sores, or bruises on your feet.  You have an injury that is not healing.  You have redness on your legs or feet.  You feel burning or tingling in your legs or feet.  You have pain or cramps in your legs and feet.  Your legs or feet are numb.  Your feet always feel cold.  You have pain around any toenails. Get help right away if:  You have a wound, scrape, corn, or callus on your foot and: ? You have pain, swelling, or redness that gets worse. ? You have fluid or blood coming from the wound, scrape, corn, or callus. ? Your wound, scrape, corn, or callus feels warm to the touch. ? You have pus or a bad smell coming from the wound, scrape, corn, or callus. ? You have a fever. ? You have a red line going up your leg. Summary  Check your feet every day for blisters, cuts, bruises, sores, and redness.  Apply a moisturizing lotion or petroleum jelly to the skin on your feet and to dry, brittle  toenails.  Wear shoes that fit properly and have enough cushioning.  If you have foot problems, report any cuts, sores, or bruises to your health care provider  immediately.  Schedule a complete foot exam at least once a year (annually) or more often if you have foot problems. This information is not intended to replace advice given to you by your health care provider. Make sure you discuss any questions you have with your health care provider. Document Revised: 04/26/2020 Document Reviewed: 04/26/2020 Elsevier Patient Education  [place].

## 2021-03-25 NOTE — Progress Notes (Signed)
Subjective:   Patient ID: Shannon [name], female   DOB: 55 y.o.   MRN: [id number]   HPI 55 year old female presents the office today for concerns of hammertoe on her right second toe which is causing discomfort.  She states that its been red inside shoes and being diabetic with neuropathy she wants to make sure there are no ulcers formed.  She is tried purchased an over-the-counter splint for her toe but she states this caused numbness to her toes so she stopped wearing it.  Denies any recent injury.  She is diabetic her last A1c was 6.2 on Feb 19, 2021.    Review of Systems  All other systems reviewed and are negative.  Past Medical History:  Diagnosis Date  . Anxiety   . Arthritis    back and hips   . Depression   . Diabetes mellitus type II, controlled ([place]) 08/15/2014  . GERD (gastroesophageal reflux disease)   . HPV (human papilloma virus) infection   . Hyperlipidemia   . Lateral meniscus tear    left knee  . PONV (postoperative nausea and vomiting)    slow to wake up   . Pre-diabetes   . Shortness of breath dyspnea    with exertion   . Sleep apnea    cpap -0 setting at 16, uses CPAP nightly  . Stress incontinence     Past Surgical History:  Procedure Laterality Date  . APPENDECTOMY    . BREATH TEK H PYLORI N/A 04/03/2015   Procedure: BREATH TEK H PYLORI;  Surgeon: Greer Pickerel, MD;  Location: [facility];  Service: General;  Laterality: N/A;  . CESAREAN SECTION    . 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   second interspace, left foot  . feet surgery    . KNEE ARTHROSCOPY Right 03/17/2019   Procedure: RIGHT KNEE ARTHROSCOPY WITH PARTIAL LATERAL MENISCECTOMY, REMOVAL OF LOOSE BODIES, 3 COMPARTMENT CHONDROPLASTY;  Surgeon: Mcarthur Rossetti, MD;  Location: [facility];  Service: Orthopedics;  Laterality: Right;  . LAPAROSCOPIC GASTRIC SLEEVE RESECTION WITH HIATAL HERNIA REPAIR  N/A 08/13/2015   Procedure: LAPAROSCOPIC GASTRIC SLEEVE RESECTION WITH HIATAL HERNIA REPAIR;  Surgeon: Greer Pickerel, MD;  Location: [facility];  Service: General;  Laterality: N/A;  . laproscopy    . UPPER GI ENDOSCOPY  08/13/2015   Procedure: UPPER GI ENDOSCOPY;  Surgeon: Greer Pickerel, MD;  Location: [facility];  Service: General;;  . WISDOM TOOTH EXTRACTION       Current Outpatient Medications:  .  ALPRAZolam (XANAX) 0.5 MG tablet, TAKE 1 TABLET (0.5 MG TOTAL) BY MOUTH DAILY AS NEEDED. FOR ANXIETY, Disp: 30 tablet, Rfl: 0 .  citalopram (CELEXA) 40 MG tablet, TAKE 1 TABLET (40 MG TOTAL) BY MOUTH DAILY., Disp: 30 tablet, Rfl: 2 .  clotrimazole-betamethasone (LOTRISONE) cream, APPLY 1 APPLICATION TOPICALLY 2 (TWO) TIMES DAILY. FOR EXTERNAL USE., Disp: 30 g, Rfl: 0 .  cyclobenzaprine (FLEXERIL) 10 MG tablet, TAKE 1 TABLET BY MOUTH TWICE DAILY AS NEEDED, Disp: 20 tablet, Rfl: 0 .  lamoTRIgine (LAMICTAL) 200 MG tablet, TAKE 1 TABLET (200 MG TOTAL) BY MOUTH DAILY., Disp: 30 tablet, Rfl: 2 .  mirabegron ER (MYRBETRIQ) 50 MG TB24 tablet, Take 1 tablet (50 mg total) by mouth daily., Disp: 90 tablet, Rfl: 3 .  Pitavastatin Calcium 1 MG TABS, Take 1 tablet (1 mg total) by mouth daily., Disp: 90 tablet, Rfl: 3 .  pregabalin (LYRICA)  100 MG capsule, TAKE 1 CAPSULE (100 MG TOTAL) BY MOUTH 2 (TWO) TIMES DAILY., Disp: 60 capsule, Rfl: 3 .  Semaglutide, 1 MG/DOSE, (OZEMPIC, 1 MG/DOSE,) 4 MG/3ML SOPN, Inject 1 mg into the skin once a week., Disp: 3 mL, Rfl: 2 .  solifenacin (VESICARE) 10 MG tablet, Take 1 tablet (10 mg total) by mouth daily., Disp: 90 tablet, Rfl: 3 .  Vilazodone HCl (VIIBRYD) 10 MG TABS, TAKE 1 TABLET (10 MG TOTAL) BY MOUTH DAILY., Disp: 30 tablet, Rfl: 1 .  Vitamin D, Ergocalciferol, (DRISDOL) 1.25 MG (50000 UNIT) CAPS capsule, TAKE 1 CAPSULE (50,000 UNITS TOTAL) BY MOUTH EVERY 7 (SEVEN) DAYS., Disp: 12 capsule, Rfl: 0  Allergies  Allergen Reactions  . Food Swelling    Big Red Gum makes her tongue  swell  . Latuda [Lurasidone Hcl] Other (See Comments)    Severe aggitation  . Lipitor [Atorvastatin] Other (See Comments)    Muscle aches  . Belviq [Lorcaserin Hcl]     Increased appetitie  . Contrave [Naltrexone-Bupropion Hcl Er] Other (See Comments)    Sleepy.          Objective:  Physical Exam  General: AAO x3, NAD  Dermatological: Skin is warm, dry and supple bilateral.  There are no open sores, no preulcerative lesions, no rash or signs of infection present.  Vascular: Dorsalis Pedis artery and Posterior Tibial artery pedal pulses are 2/4 bilateral with immedate capillary fill time. There is no pain with calf compression, swelling, warmth, erythema.   Neruologic: History of neuropathy.  Decrease in station.  Musculoskeletal: Hammertoes noted on the right foot.  Flexible hammertoe noted of the third through fifth digits.  Second toe is semirigid.  There is mild erythema of the dorsal PIPJ with more is been red inside shoes there is no skin breakdown or warmth.  No callus formation.  No other areas of discomfort identified today.  Gait: Unassisted, Nonantalgic.       Assessment:   55 year old female with hammertoes right foot, type 2 diabetes with neuropathy     Plan:  -Treatment options discussed including all alternatives, risks, and complications -Etiology of symptoms were discussed -X-rays were obtained and reviewed with the patient.  Hammertoes present.  Heel spurs evident.  No evidence of acute fracture. -We discussed both conservative as well as surgical treatment options for the hammertoes.  Restart conservative treatment.  Dispensed a toe cap.  We discussed changing shoes and wear softer toeboxcustom extra-depth or double depth shoe. -If needed consider surgical intervention in the future.  Discussed the possibility of infection.  Trula Slade DPM

## 2021-03-28 ENCOUNTER — Other Ambulatory Visit (HOSPITAL_BASED_OUTPATIENT_CLINIC_OR_DEPARTMENT_OTHER): Payer: Self-pay

## 2021-03-28 MED FILL — Pregabalin Cap 100 MG: ORAL | 30 days supply | Qty: 60 | Fill #2 | Status: CN

## 2021-03-29 ENCOUNTER — Other Ambulatory Visit (HOSPITAL_BASED_OUTPATIENT_CLINIC_OR_DEPARTMENT_OTHER): Payer: Self-pay

## 2021-04-01 ENCOUNTER — Other Ambulatory Visit (HOSPITAL_BASED_OUTPATIENT_CLINIC_OR_DEPARTMENT_OTHER): Payer: Self-pay

## 2021-04-04 ENCOUNTER — Other Ambulatory Visit (HOSPITAL_BASED_OUTPATIENT_CLINIC_OR_DEPARTMENT_OTHER): Payer: Self-pay

## 2021-04-05 ENCOUNTER — Other Ambulatory Visit (HOSPITAL_BASED_OUTPATIENT_CLINIC_OR_DEPARTMENT_OTHER): Payer: Self-pay

## 2021-04-05 ENCOUNTER — Other Ambulatory Visit (HOSPITAL_COMMUNITY): Payer: Self-pay

## 2021-04-05 MED FILL — Pregabalin Cap 100 MG: ORAL | 30 days supply | Qty: 60 | Fill #2 | Status: AC

## 2021-04-06 ENCOUNTER — Other Ambulatory Visit (HOSPITAL_COMMUNITY): Payer: Self-pay

## 2021-04-09 ENCOUNTER — Other Ambulatory Visit (HOSPITAL_BASED_OUTPATIENT_CLINIC_OR_DEPARTMENT_OTHER): Payer: Self-pay

## 2021-04-09 ENCOUNTER — Other Ambulatory Visit (HOSPITAL_COMMUNITY): Payer: Self-pay

## 2021-04-09 ENCOUNTER — Other Ambulatory Visit (HOSPITAL_COMMUNITY): Payer: Self-pay | Admitting: Psychiatry

## 2021-04-09 MED ORDER — ALPRAZOLAM 0.5 MG PO TABS
ORAL_TABLET | ORAL | 0 refills | Status: DC
  Filled 2021-04-09: qty 30, 30d supply, fill #0

## 2021-04-10 ENCOUNTER — Ambulatory Visit: Payer: 59 | Admitting: Physician Assistant

## 2021-04-24 ENCOUNTER — Other Ambulatory Visit (HOSPITAL_COMMUNITY): Payer: Self-pay

## 2021-05-04 ENCOUNTER — Other Ambulatory Visit (HOSPITAL_COMMUNITY): Payer: Self-pay

## 2021-05-06 ENCOUNTER — Other Ambulatory Visit (HOSPITAL_COMMUNITY): Payer: Self-pay

## 2021-05-08 ENCOUNTER — Other Ambulatory Visit (HOSPITAL_COMMUNITY): Payer: Self-pay

## 2021-05-08 ENCOUNTER — Other Ambulatory Visit (HOSPITAL_COMMUNITY): Payer: Self-pay | Admitting: Psychiatry

## 2021-05-08 ENCOUNTER — Other Ambulatory Visit: Payer: Self-pay | Admitting: Physician Assistant

## 2021-05-08 ENCOUNTER — Other Ambulatory Visit: Payer: Self-pay | Admitting: Sports Medicine

## 2021-05-08 ENCOUNTER — Other Ambulatory Visit (HOSPITAL_BASED_OUTPATIENT_CLINIC_OR_DEPARTMENT_OTHER): Payer: Self-pay

## 2021-05-08 DIAGNOSIS — M7918 Myalgia, other site: Secondary | ICD-10-CM

## 2021-05-08 DIAGNOSIS — E1121 Type 2 diabetes mellitus with diabetic nephropathy: Secondary | ICD-10-CM

## 2021-05-08 DIAGNOSIS — E559 Vitamin D deficiency, unspecified: Secondary | ICD-10-CM

## 2021-05-08 MED ORDER — PREGABALIN 100 MG PO CAPS
ORAL_CAPSULE | Freq: Two times a day (BID) | ORAL | 3 refills | Status: DC
  Filled 2021-05-08: qty 60, 30d supply, fill #0
  Filled 2021-06-10: qty 60, 30d supply, fill #1
  Filled 2021-07-11: qty 60, 30d supply, fill #2
  Filled 2021-07-30 – 2021-08-05 (×2): qty 60, 30d supply, fill #3

## 2021-05-08 MED ORDER — OZEMPIC (1 MG/DOSE) 4 MG/3ML ~~LOC~~ SOPN
1.0000 mg | PEN_INJECTOR | SUBCUTANEOUS | 2 refills | Status: DC
  Filled 2021-05-08: qty 3, 28d supply, fill #0
  Filled 2021-06-10: qty 6, 56d supply, fill #1

## 2021-05-08 MED ORDER — VITAMIN D (ERGOCALCIFEROL) 1.25 MG (50000 UNIT) PO CAPS
ORAL_CAPSULE | ORAL | 0 refills | Status: DC
  Filled 2021-05-08: qty 12, 84d supply, fill #0

## 2021-05-08 MED ORDER — VILAZODONE HCL 10 MG PO TABS
ORAL_TABLET | Freq: Every day | ORAL | 1 refills | Status: DC
  Filled 2021-05-08: qty 30, 30d supply, fill #0

## 2021-05-09 ENCOUNTER — Other Ambulatory Visit (HOSPITAL_COMMUNITY): Payer: Self-pay

## 2021-05-09 ENCOUNTER — Other Ambulatory Visit (HOSPITAL_BASED_OUTPATIENT_CLINIC_OR_DEPARTMENT_OTHER): Payer: Self-pay

## 2021-05-10 DIAGNOSIS — Z20822 Contact with and (suspected) exposure to covid-19: Secondary | ICD-10-CM | POA: Diagnosis not present

## 2021-05-14 DIAGNOSIS — G4733 Obstructive sleep apnea (adult) (pediatric): Secondary | ICD-10-CM | POA: Diagnosis not present

## 2021-05-22 ENCOUNTER — Other Ambulatory Visit (HOSPITAL_BASED_OUTPATIENT_CLINIC_OR_DEPARTMENT_OTHER): Payer: Self-pay

## 2021-05-22 ENCOUNTER — Ambulatory Visit: Payer: 59 | Admitting: Physician Assistant

## 2021-05-22 DIAGNOSIS — U071 COVID-19: Secondary | ICD-10-CM | POA: Diagnosis not present

## 2021-05-22 MED ORDER — LIDOCAINE VISCOUS HCL 2 % MT SOLN
OROMUCOSAL | 0 refills | Status: DC
  Filled 2021-05-22: qty 100, 10d supply, fill #0

## 2021-05-23 ENCOUNTER — Telehealth (HOSPITAL_COMMUNITY): Payer: Self-pay | Admitting: *Deleted

## 2021-05-23 MED ORDER — ALPRAZOLAM 0.5 MG PO TABS: ORAL_TABLET | ORAL | 0 refills | Status: DC

## 2021-05-23 NOTE — Telephone Encounter (Signed)
Pt called requesting refill Rx Xanax--[facility]

## 2021-05-28 ENCOUNTER — Ambulatory Visit: Payer: 59 | Admitting: Physician Assistant

## 2021-06-04 ENCOUNTER — Telehealth (INDEPENDENT_AMBULATORY_CARE_PROVIDER_SITE_OTHER): Payer: 59 | Admitting: Psychiatry

## 2021-06-04 ENCOUNTER — Other Ambulatory Visit (HOSPITAL_BASED_OUTPATIENT_CLINIC_OR_DEPARTMENT_OTHER): Payer: Self-pay

## 2021-06-04 ENCOUNTER — Encounter (HOSPITAL_COMMUNITY): Payer: Self-pay | Admitting: Psychiatry

## 2021-06-04 DIAGNOSIS — F411 Generalized anxiety disorder: Secondary | ICD-10-CM | POA: Diagnosis not present

## 2021-06-04 DIAGNOSIS — F331 Major depressive disorder, recurrent, moderate: Secondary | ICD-10-CM | POA: Diagnosis not present

## 2021-06-04 MED ORDER — CITALOPRAM HYDROBROMIDE 40 MG PO TABS
ORAL_TABLET | Freq: Every day | ORAL | 2 refills | Status: DC
  Filled 2021-06-04: qty 30, 30d supply, fill #0
  Filled 2021-07-11: qty 30, 30d supply, fill #1
  Filled 2021-07-30 – 2021-08-05 (×2): qty 30, 30d supply, fill #2

## 2021-06-04 MED ORDER — VILAZODONE HCL 10 MG PO TABS
ORAL_TABLET | Freq: Every day | ORAL | 1 refills | Status: DC
  Filled 2021-06-04: qty 30, 30d supply, fill #0
  Filled 2021-07-11: qty 30, 30d supply, fill #1

## 2021-06-04 MED ORDER — LAMOTRIGINE 200 MG PO TABS
ORAL_TABLET | Freq: Every day | ORAL | 2 refills | Status: DC
  Filled 2021-06-04: qty 30, 30d supply, fill #0
  Filled 2021-07-11: qty 30, 30d supply, fill #1
  Filled 2021-07-30 – 2021-08-05 (×2): qty 30, 30d supply, fill #2

## 2021-06-04 NOTE — Progress Notes (Signed)
Patient ID: Shannon [name], female   DOB: 1966-10-06, 55 y.o.   MRN: [id number]  [facility] [place] Outpatient Follow up visit  Shannon [name] [id number] 55 y.o.  06/04/2021 11:26 AM  Chief Complaint:  Depression follow up     Virtual Visit via Telephone Note  I connected with Shannon [name] on 06/04/21 at 11:15 AM EDT by telephone and verified that I am speaking with the correct person using two identifiers.  Location: Patient: home Provider: office   I discussed the limitations, risks, security and privacy concerns of performing an evaluation and management service by telephone and the availability of in person appointments. I also discussed with the patient that there may be a patient responsible charge related to this service. The patient expressed understanding and agreed to proceed.     I discussed the assessment and treatment plan with the patient. The patient was provided an opportunity to ask questions and all were answered. The patient agreed with the plan and demonstrated an understanding of the instructions.   The patient was advised to call back or seek an in-person evaluation if the symptoms worsen or if the condition fails to improve as anticipated.  I provided 11 minutes of non-face-to-face time during this encounter.     HPI: recovering from covid, all family had it Feels fatigue Overall mood fair and meds hellp Have to take xanax prn for anxiety   Modifying factor: mom, craft work,son Aggravating factor : covid, now recovering No side effects.   Duration  5 plus years    Past Psychiatric History/Hospitalization(s) Manged for depression and mood symptoms for more then 20 years.  Prior Suicide Attempts: No  Medical History; Past Medical History:  Diagnosis Date   Anxiety    Arthritis    back and hips    Depression    Diabetes mellitus type II, controlled ([place]) 08/15/2014   GERD (gastroesophageal reflux disease)    HPV (human papilloma virus) infection     Hyperlipidemia    Lateral meniscus tear    left knee   PONV (postoperative nausea and vomiting)    slow to wake up    Pre-diabetes    Shortness of breath dyspnea    with exertion    Sleep apnea    cpap -0 setting at 16, uses CPAP nightly   Stress incontinence     Allergies: Allergies  Allergen Reactions   Food Swelling    Big Red Gum makes her tongue swell   Latuda [Lurasidone Hcl] Other (See Comments)    Severe aggitation   Lipitor [Atorvastatin] Other (See Comments)    Muscle aches   Belviq [Lorcaserin Hcl]     Increased appetitie   Contrave [Naltrexone-Bupropion Hcl Er] Other (See Comments)    Sleepy.    Medications: Outpatient Encounter Medications as of 06/04/2021  Medication Sig   ALPRAZolam (XANAX) 0.5 MG tablet TAKE 1 TABLET (0.5 MG TOTAL) BY MOUTH DAILY AS NEEDED. FOR ANXIETY   citalopram (CELEXA) 40 MG tablet TAKE 1 TABLET (40 MG TOTAL) BY MOUTH DAILY.   clotrimazole-betamethasone (LOTRISONE) cream APPLY 1 APPLICATION TOPICALLY 2 (TWO) TIMES DAILY. FOR EXTERNAL USE.   cyclobenzaprine (FLEXERIL) 10 MG tablet TAKE 1 TABLET BY MOUTH TWICE DAILY AS NEEDED   lamoTRIgine (LAMICTAL) 200 MG tablet TAKE 1 TABLET (200 MG TOTAL) BY MOUTH DAILY.   lidocaine (XYLOCAINE) 2 % solution apply 1 application to affected mucosal area 2 - 3 times daily as needed for pain   mirabegron ER (  MYRBETRIQ) 50 MG TB24 tablet Take 1 tablet (50 mg total) by mouth daily.   Pitavastatin Calcium 1 MG TABS Take 1 tablet (1 mg total) by mouth daily.   pregabalin (LYRICA) 100 MG capsule TAKE 1 CAPSULE (100 MG TOTAL) BY MOUTH 2 (TWO) TIMES DAILY.   Semaglutide, 1 MG/DOSE, (OZEMPIC, 1 MG/DOSE,) 4 MG/3ML SOPN Inject 1 mg into the skin once a week.   solifenacin (VESICARE) 10 MG tablet Take 1 tablet (10 mg total) by mouth daily.   Vilazodone HCl (VIIBRYD) 10 MG TABS TAKE 1 TABLET (10 MG TOTAL) BY MOUTH DAILY.   Vitamin D, Ergocalciferol, (DRISDOL) 1.25 MG (50000 UNIT) CAPS capsule TAKE 1 CAPSULE (50,000  UNITS TOTAL) BY MOUTH EVERY 7 (SEVEN) DAYS.   [DISCONTINUED] citalopram (CELEXA) 40 MG tablet TAKE 1 TABLET (40 MG TOTAL) BY MOUTH DAILY.   [DISCONTINUED] lamoTRIgine (LAMICTAL) 200 MG tablet TAKE 1 TABLET (200 MG TOTAL) BY MOUTH DAILY.   [DISCONTINUED] Vilazodone HCl (VIIBRYD) 10 MG TABS TAKE 1 TABLET (10 MG TOTAL) BY MOUTH DAILY.   No facility-administered encounter medications on file as of 06/04/2021.     Family History; Family History  Problem Relation Age of Onset   Hyperlipidemia Mother    Sleep apnea Mother    Depression Mother    Diabetes Father    Hypertension Father    COPD Father    Diabetes Paternal Aunt    Diabetes Paternal Uncle    Alcohol abuse Paternal Uncle    Leukemia Paternal Uncle    Alcohol abuse Paternal Grandfather    Depression Sister       Labs:  No results found for this or any previous visit (from the past 2160 hour(s)).       Mental Status Examination;   Psychiatric Specialty Exam: Physical Exam  Review of Systems  Psychiatric/Behavioral:  Negative for depression and hallucinations.    There were no vitals taken for this visit.There is no height or weight on file to calculate BMI.  General Appearance:  Eye Contact::    Speech:  Normal Rate  Volume:  Normal  Mood : fair  Affect:  Congruent   Thought Process:  Coherent  Orientation:  Full (Time, Place, and Person)  Thought Content:  Rumination  Suicidal Thoughts:  No  Homicidal Thoughts:  No  Memory:  Immediate;   Fair Recent;   Fair  Judgement:  Fair  Insight:  Shallow  Psychomotor Activity:  Normal  Concentration:  Fair  Recall:  Fair  Akathisia:  Negative  Handed:  Right  AIMS (if indicated):     Assets:  Communication Skills Desire for Improvement Financial Resources/Insurance Housing  Sleep:        Assessment: Axis I: mood disorder unspecified. Rule out mood disorder secondary to general medical condition. Major depressive disorder recurrent moderate. Rule out  panic disorder. Generalized anxiety disorder   Axis III:  Past Medical History:  Diagnosis Date   Anxiety    Arthritis    back and hips    Depression    Diabetes mellitus type II, controlled ([place]) 08/15/2014   GERD (gastroesophageal reflux disease)    HPV (human papilloma virus) infection    Hyperlipidemia    Lateral meniscus tear    left knee   PONV (postoperative nausea and vomiting)    slow to wake up    Pre-diabetes    Shortness of breath dyspnea    with exertion    Sleep apnea    cpap -0 setting at  16, uses CPAP nightly   Stress incontinence     Axis IV: psychosocial   Treatment Plan and Summary:   Prior documentation reviewed   Depression: fhandling it continue vibryd, , lamictal  Anxiety : doing fair, continue celexa, xanax Panic attacks sporadic , takes xanax prn Fu 6m  , Danyel Tobey, MD 06/04/2021

## 2021-06-10 ENCOUNTER — Other Ambulatory Visit (HOSPITAL_BASED_OUTPATIENT_CLINIC_OR_DEPARTMENT_OTHER): Payer: Self-pay

## 2021-06-11 ENCOUNTER — Ambulatory Visit: Payer: 59 | Admitting: Physician Assistant

## 2021-06-12 ENCOUNTER — Telehealth (HOSPITAL_COMMUNITY): Payer: 59 | Admitting: Psychiatry

## 2021-06-18 ENCOUNTER — Ambulatory Visit: Payer: 59 | Admitting: Physician Assistant

## 2021-06-18 ENCOUNTER — Other Ambulatory Visit (HOSPITAL_BASED_OUTPATIENT_CLINIC_OR_DEPARTMENT_OTHER): Payer: Self-pay

## 2021-06-18 ENCOUNTER — Other Ambulatory Visit: Payer: Self-pay

## 2021-06-18 VITALS — BP 125/69 | HR 76 | Ht 65.0 in | Wt 215.0 lb

## 2021-06-18 DIAGNOSIS — E039 Hypothyroidism, unspecified: Secondary | ICD-10-CM | POA: Diagnosis not present

## 2021-06-18 DIAGNOSIS — Z6837 Body mass index (BMI) 37.0-37.9, adult: Secondary | ICD-10-CM

## 2021-06-18 DIAGNOSIS — Z9989 Dependence on other enabling machines and devices: Secondary | ICD-10-CM

## 2021-06-18 DIAGNOSIS — G4733 Obstructive sleep apnea (adult) (pediatric): Secondary | ICD-10-CM

## 2021-06-18 DIAGNOSIS — R8290 Unspecified abnormal findings in urine: Secondary | ICD-10-CM | POA: Diagnosis not present

## 2021-06-18 DIAGNOSIS — E66812 Obesity, class 2: Secondary | ICD-10-CM

## 2021-06-18 DIAGNOSIS — Z23 Encounter for immunization: Secondary | ICD-10-CM

## 2021-06-18 DIAGNOSIS — N3281 Overactive bladder: Secondary | ICD-10-CM

## 2021-06-18 DIAGNOSIS — E559 Vitamin D deficiency, unspecified: Secondary | ICD-10-CM | POA: Diagnosis not present

## 2021-06-18 DIAGNOSIS — R319 Hematuria, unspecified: Secondary | ICD-10-CM | POA: Diagnosis not present

## 2021-06-18 DIAGNOSIS — E1121 Type 2 diabetes mellitus with diabetic nephropathy: Secondary | ICD-10-CM

## 2021-06-18 LAB — POCT URINALYSIS DIP (CLINITEK)
Bilirubin, UA: NEGATIVE
Glucose, UA: NEGATIVE mg/dL
Ketones, POC UA: NEGATIVE mg/dL
Nitrite, UA: NEGATIVE
POC PROTEIN,UA: NEGATIVE
Spec Grav, UA: >=1.03 — AB (ref 1.010–1.025)
Urobilinogen, UA: 0.2 E.U./dL
pH, UA: 6 (ref 5.0–8.0)

## 2021-06-18 LAB — POCT GLYCOSYLATED HEMOGLOBIN (HGB A1C): Hemoglobin A1C: 5.7 % — AB (ref 4.0–5.6)

## 2021-06-18 MED ORDER — OZEMPIC (1 MG/DOSE) 4 MG/3ML ~~LOC~~ SOPN
1.0000 mg | PEN_INJECTOR | SUBCUTANEOUS | 0 refills | Status: DC
  Filled 2021-06-18 – 2021-09-04 (×3): qty 9, 84d supply, fill #0

## 2021-06-18 MED ORDER — VITAMIN D (ERGOCALCIFEROL) 1.25 MG (50000 UNIT) PO CAPS
ORAL_CAPSULE | ORAL | 0 refills | Status: DC
  Filled 2021-06-18: qty 12, fill #0
  Filled 2021-09-04: qty 12, 84d supply, fill #0

## 2021-06-18 NOTE — Progress Notes (Signed)
Subjective:    Patient ID: Shannon [name], female    DOB: Oct 19, 1966, 55 y.o.   MRN: [id number]  HPI Patient is a 55 year old obese female with type 2 diabetes, hypothyroidism, OSA, OAB, mood disorder, anxiety who presents to the clinic for 53-monthfollow-up.  Patient is not checking her sugars.  She is compliant with her Ozempic.  She denies any concerns or complaints with medication.  She has noticed she is eating less and losing weight.  She is down 17 pounds in 3 months.  She also did recently have COVID.  She denies any open sores or wounds.  She denies any hypoglycemic events.  Patient does not have any chest pain, palpitations, headache or vision changes.  She is having some abnormal urine odor. No pain or burning. She had OAB controlled with mybetriq.no fever, chills, flank pain.   [facility]    .. 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   Cervical myofascial pain syndrome 07/31/2016   Fibroids 10/30/2016   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   Iron deficiency anemia secondary to inadequate dietary iron intake 03/25/2018   Chest pain 05/09/2018   BPPV (benign paroxysmal positional vertigo), right 05/09/2018   Polyp of colon 05/21/2018   Primary osteoarthritis of right knee 10/06/2018   Acute lateral meniscus tear of right knee 03/17/2019   Loss of balance 05/24/2019   Vertigo 05/24/2019   OAB (overactive bladder)  11/23/2019   Fever 08/13/2020   Fatigue 08/13/2020   Family history of leukemia 08/14/2020   SOB (shortness of breath) on exertion 08/14/2020   Bilateral lower extremity edema 08/14/2020   Elevated platelet count 08/14/2020   Digital mucinous cyst of right thumb 10/23/2020   Vitamin D deficiency 11/27/2020   Unilateral primary osteoarthritis, right knee 02/06/2021   Hematuria 06/18/2021   Resolved Ambulatory Problems    Diagnosis Date Noted   Unspecified otitis media 11/22/2008   Acute sinusitis, unspecified 11/22/2008   URI 09/28/2009   BACK PAIN, LUMBAR 12/26/2008   Burning sensation of the foot 08/28/2014   Numbness of both lower extremities 08/28/2014   BMI 38.0-38.9,adult 12/29/2014   Status post gastric surgery 11/12/2015   Past Medical History:  Diagnosis Date   Anxiety    Arthritis    Depression    Diabetes mellitus type II, controlled ([facility] 08/15/2014   GERD (gastroesophageal reflux disease)    HPV (human papilloma virus) infection    Lateral meniscus tear    PONV (postoperative nausea and vomiting)    Pre-diabetes    Shortness of breath dyspnea    Stress incontinence      Review of Systems    See HPI.  Objective:   Physical Exam Vitals reviewed.  Constitutional:      Appearance: Normal appearance.  HENT:     Head: Normocephalic.  Neck:     Vascular: No carotid bruit.  Cardiovascular:     Rate and  Rhythm: Normal rate and regular rhythm.     Pulses: Normal pulses.  Pulmonary:     Effort: Pulmonary effort is normal.     Breath sounds: Normal breath sounds.  Abdominal:     General: Bowel sounds are normal. There is no distension.     Palpations: Abdomen is soft.     Tenderness: There is no abdominal tenderness. There is no right CVA tenderness, left CVA tenderness or rebound.  Musculoskeletal:     Right lower leg: No edema.     Left lower leg: No edema.  Neurological:     General: No focal deficit present.     Mental Status: She is alert and  oriented to person, place, and time.  Psychiatric:        Mood and Affect: Mood normal.     .. Results for orders placed or performed in visit on 06/18/21  POCT glycosylated hemoglobin (Hb A1C)  Result Value Ref Range   Hemoglobin A1C 5.7 (A) 4.0 - 5.6 %   HbA1c POC (<> result, manual entry)     HbA1c, POC (prediabetic range)     HbA1c, POC (controlled diabetic range)    POCT URINALYSIS DIP (CLINITEK)  Result Value Ref Range   Color, UA yellow yellow   Clarity, UA clear clear   Glucose, UA negative negative mg/dL   Bilirubin, UA negative negative   Ketones, POC UA negative negative mg/dL   Spec Grav, UA >=1.030 (A) 1.010 - 1.025   Blood, UA trace-intact (A) negative   pH, UA 6.0 5.0 - 8.0   POC PROTEIN,UA negative negative, trace   Urobilinogen, UA 0.2 0.2 or 1.0 E.U./dL   Nitrite, UA Negative Negative   Leukocytes, UA Small (1+) (A) Negative        Assessment & Plan:  Marland KitchenMarland KitchenAmy was seen today for diabetes.  Diagnoses and all orders for this visit:  Type II diabetes mellitus with nephropathy ([place]) -     POCT glycosylated hemoglobin (Hb A1C) -     Semaglutide, 1 MG/DOSE, (OZEMPIC, 1 MG/DOSE,) 4 MG/3ML SOPN; Inject 1 mg into the skin once a week.  OSA on CPAP  Hypothyroidism, unspecified type  OAB (overactive bladder)  Class 2 severe obesity due to excess calories with serious comorbidity and body mass index (BMI) of 37.0 to 37.9 in adult (HCC) -     Semaglutide, 1 MG/DOSE, (OZEMPIC, 1 MG/DOSE,) 4 MG/3ML SOPN; Inject 1 mg into the skin once a week.  Vitamin D deficiency -     Vitamin D, Ergocalciferol, (DRISDOL) 1.25 MG (50000 UNIT) CAPS capsule; TAKE 1 CAPSULE (50,000 UNITS TOTAL) BY MOUTH EVERY 7 (SEVEN) DAYS.  Abnormal urine odor -     POCT URINALYSIS DIP (CLINITEK) -     Urine Culture  Needs flu shot -     Flu Vaccine QUAD 87moIM (Fluarix, Fluzone & Alfiuria Quad PF)  Need for shingles vaccine -     Varicella-zoster vaccine IM  Hematuria, unspecified  type  A1C is great at 5.7.  Lost 17lbs in 3 months. Goal is 15lbs in the next 3 months.  Continue ozempic.  BP to goal.  On statin.  UTD covid vaccine.  Flu shot given today.  1st dose of shingrix given today.  Follow up in 3 months.   UA positive for leuks and trace blood.  Will culture.  If no infection repeat UA in 4 weeks.  Follow up as needed or with any new symptoms.  Stay hydrated.

## 2021-06-19 ENCOUNTER — Encounter: Payer: Self-pay | Admitting: Physician Assistant

## 2021-06-19 DIAGNOSIS — R8290 Unspecified abnormal findings in urine: Secondary | ICD-10-CM | POA: Insufficient documentation

## 2021-06-20 ENCOUNTER — Other Ambulatory Visit: Payer: Self-pay | Admitting: Physician Assistant

## 2021-06-20 ENCOUNTER — Other Ambulatory Visit (HOSPITAL_BASED_OUTPATIENT_CLINIC_OR_DEPARTMENT_OTHER): Payer: Self-pay

## 2021-06-20 LAB — URINE CULTURE
MICRO NUMBER:: 12313725
SPECIMEN QUALITY:: ADEQUATE

## 2021-06-20 MED ORDER — AMOXICILLIN-POT CLAVULANATE 500-125 MG PO TABS
1.0000 | ORAL_TABLET | Freq: Two times a day (BID) | ORAL | 0 refills | Status: DC
  Filled 2021-06-20: qty 14, 7d supply, fill #0

## 2021-06-20 NOTE — Progress Notes (Signed)
Strep agalactiae found in urine. Sent augmentin to pharmacy to treat.

## 2021-07-03 ENCOUNTER — Other Ambulatory Visit (HOSPITAL_BASED_OUTPATIENT_CLINIC_OR_DEPARTMENT_OTHER): Payer: Self-pay

## 2021-07-03 ENCOUNTER — Telehealth (HOSPITAL_COMMUNITY): Payer: Self-pay

## 2021-07-03 MED ORDER — ALPRAZOLAM 0.5 MG PO TABS
ORAL_TABLET | ORAL | 0 refills | Status: DC
  Filled 2021-07-03: qty 30, 30d supply, fill #0

## 2021-07-03 NOTE — Telephone Encounter (Signed)
Xanax Refill Request  - [facility].

## 2021-07-11 ENCOUNTER — Other Ambulatory Visit (HOSPITAL_BASED_OUTPATIENT_CLINIC_OR_DEPARTMENT_OTHER): Payer: Self-pay

## 2021-07-23 ENCOUNTER — Ambulatory Visit: Payer: 59 | Admitting: Sports Medicine

## 2021-07-30 ENCOUNTER — Other Ambulatory Visit (HOSPITAL_COMMUNITY): Payer: Self-pay | Admitting: Psychiatry

## 2021-07-30 ENCOUNTER — Other Ambulatory Visit (HOSPITAL_BASED_OUTPATIENT_CLINIC_OR_DEPARTMENT_OTHER): Payer: Self-pay

## 2021-07-30 MED ORDER — VILAZODONE HCL 10 MG PO TABS
ORAL_TABLET | Freq: Every day | ORAL | 1 refills | Status: DC
  Filled 2021-07-30: qty 30, fill #0
  Filled 2021-08-10: qty 30, 30d supply, fill #0
  Filled 2021-09-20: qty 30, 30d supply, fill #1

## 2021-07-30 MED ORDER — ALPRAZOLAM 0.5 MG PO TABS
ORAL_TABLET | ORAL | 0 refills | Status: DC
  Filled 2021-07-30: qty 30, 30d supply, fill #0

## 2021-07-30 NOTE — Telephone Encounter (Signed)
Patient called requesting refill on Xanax.  [place]

## 2021-08-05 ENCOUNTER — Other Ambulatory Visit (HOSPITAL_BASED_OUTPATIENT_CLINIC_OR_DEPARTMENT_OTHER): Payer: Self-pay

## 2021-08-08 ENCOUNTER — Telehealth: Payer: Self-pay | Admitting: Neurology

## 2021-08-08 DIAGNOSIS — G4733 Obstructive sleep apnea (adult) (pediatric): Secondary | ICD-10-CM | POA: Diagnosis not present

## 2021-08-08 NOTE — Telephone Encounter (Signed)
Patient left vm asking for proof of flu vaccine to be emailed to her work email at [email or web address].   Letter written and given to our front desk to email to patient.

## 2021-08-12 ENCOUNTER — Other Ambulatory Visit (HOSPITAL_BASED_OUTPATIENT_CLINIC_OR_DEPARTMENT_OTHER): Payer: Self-pay

## 2021-08-20 ENCOUNTER — Other Ambulatory Visit (HOSPITAL_BASED_OUTPATIENT_CLINIC_OR_DEPARTMENT_OTHER): Payer: Self-pay

## 2021-08-30 ENCOUNTER — Other Ambulatory Visit (HOSPITAL_BASED_OUTPATIENT_CLINIC_OR_DEPARTMENT_OTHER): Payer: Self-pay

## 2021-09-04 ENCOUNTER — Encounter (HOSPITAL_COMMUNITY): Payer: Self-pay | Admitting: Psychiatry

## 2021-09-04 ENCOUNTER — Other Ambulatory Visit (HOSPITAL_BASED_OUTPATIENT_CLINIC_OR_DEPARTMENT_OTHER): Payer: Self-pay

## 2021-09-04 ENCOUNTER — Telehealth (INDEPENDENT_AMBULATORY_CARE_PROVIDER_SITE_OTHER): Payer: 59 | Admitting: Psychiatry

## 2021-09-04 DIAGNOSIS — F410 Panic disorder [episodic paroxysmal anxiety] without agoraphobia: Secondary | ICD-10-CM | POA: Diagnosis not present

## 2021-09-04 DIAGNOSIS — F411 Generalized anxiety disorder: Secondary | ICD-10-CM | POA: Diagnosis not present

## 2021-09-04 DIAGNOSIS — F331 Major depressive disorder, recurrent, moderate: Secondary | ICD-10-CM

## 2021-09-04 MED ORDER — ALPRAZOLAM 0.5 MG PO TABS
ORAL_TABLET | ORAL | 0 refills | Status: DC
  Filled 2021-09-04: qty 30, 30d supply, fill #0

## 2021-09-04 MED ORDER — LAMOTRIGINE 200 MG PO TABS
ORAL_TABLET | Freq: Every day | ORAL | 2 refills | Status: DC
  Filled 2021-09-04: qty 30, 30d supply, fill #0
  Filled 2021-10-04: qty 30, 30d supply, fill #1
  Filled 2021-11-01: qty 30, 30d supply, fill #2

## 2021-09-04 MED ORDER — CITALOPRAM HYDROBROMIDE 40 MG PO TABS
ORAL_TABLET | Freq: Every day | ORAL | 2 refills | Status: DC
  Filled 2021-09-04: qty 30, 30d supply, fill #0
  Filled 2021-10-04: qty 30, 30d supply, fill #1
  Filled 2021-11-01: qty 30, 30d supply, fill #2

## 2021-09-04 NOTE — Progress Notes (Signed)
Patient ID: Shannon [name], female   DOB: 02-Sep-1966, 55 y.o.   MRN: [id number]  [facility] Follow up visit  Shannon [name] [id number] 55 y.o.  09/04/2021 10:49 AM  Virtual Visit via Telephone Note  I connected with Shannon [name] on 09/04/21 at 10:30 AM EST by telephone and verified that I am speaking with the correct person using two identifiers.  Location: Patient: home Provider: home office   I discussed the limitations, risks, security and privacy concerns of performing an evaluation and management service by telephone and the availability of in person appointments. I also discussed with the patient that there may be a patient responsible charge related to this service. The patient expressed understanding and agreed to proceed.     I discussed the assessment and treatment plan with the patient. The patient was provided an opportunity to ask questions and all were answered. The patient agreed with the plan and demonstrated an understanding of the instructions.   The patient was advised to call back or seek an in-person evaluation if the symptoms worsen or if the condition fails to improve as anticipated.  I provided 11 minutes of non-face-to-face time during this encounter.  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   Duration  5 plus years    Past Psychiatric History/Hospitalization(s) Manged for depression and mood symptoms for more then 20 years.  Prior Suicide Attempts: No  Medical History; Past Medical History:  Diagnosis Date   Anxiety    Arthritis    back and hips    Depression    Diabetes mellitus type II, controlled ([place]) 08/15/2014   GERD  (gastroesophageal reflux disease)    HPV (human papilloma virus) infection    Hyperlipidemia    Lateral meniscus tear    left knee   PONV (postoperative nausea and vomiting)    slow to wake up    Pre-diabetes    Shortness of breath dyspnea    with exertion    Sleep apnea    cpap -0 setting at 16, uses CPAP nightly   Stress incontinence     Allergies: Allergies  Allergen Reactions   Food Swelling    Big Red Gum makes her tongue swell   Latuda [Lurasidone Hcl] Other (See Comments)    Severe aggitation   Lipitor [Atorvastatin] Other (See Comments)    Muscle aches   Belviq [Lorcaserin Hcl]     Increased appetitie   Contrave [Naltrexone-Bupropion Hcl Er] Other (See Comments)    Sleepy.    Medications: Outpatient Encounter Medications as of 09/04/2021  Medication Sig   ALPRAZolam (XANAX) 0.5 MG tablet TAKE 1 TABLET (0.5 MG TOTAL) BY MOUTH DAILY AS NEEDED FOR ANXIETY   amoxicillin-clavulanate (AUGMENTIN) 500-125 MG tablet Take 1 tablet (500 mg total) by mouth 2 (two) times daily.   citalopram (CELEXA) 40 MG tablet TAKE 1 TABLET (40 MG TOTAL) BY MOUTH DAILY.   clotrimazole-betamethasone (LOTRISONE) cream APPLY 1 APPLICATION TOPICALLY 2 (TWO) TIMES DAILY. FOR EXTERNAL USE.   lamoTRIgine (LAMICTAL) 200 MG tablet TAKE 1 TABLET (200 MG TOTAL) BY MOUTH DAILY.   mirabegron ER (MYRBETRIQ) 50 MG TB24 tablet Take 1 tablet (50 mg total)  by mouth daily.   Pitavastatin Calcium 1 MG TABS Take 1 tablet (1 mg total) by mouth daily.   pregabalin (LYRICA) 100 MG capsule TAKE 1 CAPSULE (100 MG TOTAL) BY MOUTH 2 (TWO) TIMES DAILY.   Semaglutide, 1 MG/DOSE, (OZEMPIC, 1 MG/DOSE,) 4 MG/3ML SOPN Inject 1 mg into the skin once a week.   Vilazodone HCl (VIIBRYD) 10 MG TABS TAKE 1 TABLET (10 MG TOTAL) BY MOUTH DAILY.   Vitamin D, Ergocalciferol, (DRISDOL) 1.25 MG (50000 UNIT) CAPS capsule TAKE 1 CAPSULE (50,000 UNITS TOTAL) BY MOUTH EVERY 7 (SEVEN) DAYS.   [DISCONTINUED] ALPRAZolam (XANAX) 0.5 MG tablet  TAKE 1 TABLET (0.5 MG TOTAL) BY MOUTH DAILY AS NEEDED FOR ANXIETY   [DISCONTINUED] citalopram (CELEXA) 40 MG tablet TAKE 1 TABLET (40 MG TOTAL) BY MOUTH DAILY.   [DISCONTINUED] lamoTRIgine (LAMICTAL) 200 MG tablet TAKE 1 TABLET (200 MG TOTAL) BY MOUTH DAILY.   No facility-administered encounter medications on file as of 09/04/2021.     Family History; Family History  Problem Relation Age of Onset   Hyperlipidemia Mother    Sleep apnea Mother    Depression Mother    Diabetes Father    Hypertension Father    COPD Father    Diabetes Paternal Aunt    Diabetes Paternal Uncle    Alcohol abuse Paternal Uncle    Leukemia Paternal Uncle    Alcohol abuse Paternal Grandfather    Depression Sister       Labs:  Recent Results (from the past 2160 hour(s))  Urine Culture     Status: Abnormal   Collection Time: 06/18/21  1:32 PM   Specimen: Urine  Result Value Ref Range   MICRO NUMBER: [id number]    SPECIMEN QUALITY: Adequate    Sample Source URINE    STATUS: FINAL    ISOLATE 1: Streptococcus agalactiae (A)     Comment: 10,000-49,000 CFU/mL of Group B Streptococcus isolated Beta-hemolytic streptococci are predictably susceptible to Penicillin and other beta-lactams. Susceptibility testing not routinely performed. Please contact the laboratory within 3 days if  susceptibility testing is desired. Erythromycin and clindamycin are not recommended for treatment of urinary tract infections, but clindamycin may be useful for treatment of rectovaginal colonization or infection. Any amount of group B Streptococcus in  urine specimens obtained from pregnant females is a marker of genital tract colonization. If this patient is pregnant, please refer to ACOG guidelines for appropriate screening and management of pregnant women.   POCT URINALYSIS DIP (CLINITEK)     Status: Abnormal   Collection Time: 06/18/21  1:36 PM  Result Value Ref Range   Color, UA yellow yellow   Clarity, UA clear clear    Glucose, UA negative negative mg/dL   Bilirubin, UA negative negative   Ketones, POC UA negative negative mg/dL   Spec Grav, UA >=1.030 (A) 1.010 - 1.025   Blood, UA trace-intact (A) negative   pH, UA 6.0 5.0 - 8.0   POC PROTEIN,UA negative negative, trace   Urobilinogen, UA 0.2 0.2 or 1.0 E.U./dL   Nitrite, UA Negative Negative   Leukocytes, UA Small (1+) (A) Negative  POCT glycosylated hemoglobin (Hb A1C)     Status: Abnormal   Collection Time: 06/18/21  1:37 PM  Result Value Ref Range   Hemoglobin A1C 5.7 (A) 4.0 - 5.6 %   HbA1c POC (<> result, manual entry)     HbA1c, POC (prediabetic range)     HbA1c, POC (controlled diabetic range)  Mental Status Examination;   Psychiatric Specialty Exam: Physical Exam  Review of Systems  Psychiatric/Behavioral:  Negative for depression and hallucinations.    There were no vitals taken for this visit.There is no height or weight on file to calculate BMI.  General Appearance:  Eye Contact::    Speech:  Normal Rate  Volume:  Normal  Mood : fair  Affect:    Thought Process:  Coherent  Orientation:  Full (Time, Place, and Person)  Thought Content:  Rumination  Suicidal Thoughts:  No  Homicidal Thoughts:  No  Memory:  Immediate;   Fair Recent;   Fair  Judgement:  Fair  Insight:  Shallow  Psychomotor Activity:  Normal  Concentration:  Fair  Recall:  Fair  Akathisia:  Negative  Handed:  Right  AIMS (if indicated):     Assets:  Communication Skills Desire for Improvement Financial Resources/Insurance Housing  Sleep:        Assessment: Axis I: mood disorder unspecified. Rule out mood disorder secondary to general medical condition. Major depressive disorder recurrent moderate. Rule out panic disorder. Generalized anxiety disorder   Axis III:  Past Medical History:  Diagnosis Date   Anxiety    Arthritis    back and hips    Depression    Diabetes mellitus type II, controlled ([place]) 08/15/2014   GERD  (gastroesophageal reflux disease)    HPV (human papilloma virus) infection    Hyperlipidemia    Lateral meniscus tear    left knee   PONV (postoperative nausea and vomiting)    slow to wake up    Pre-diabetes    Shortness of breath dyspnea    with exertion    Sleep apnea    cpap -0 setting at 16, uses CPAP nightly   Stress incontinence     Axis IV: psychosocial   Treatment Plan and Summary:  Prior documentation reviewed    Depression: fair, continue vibryd, celexa, lamictal   Anxiety : managing it fair, continue celexa, xanax  Panic attacks sporadic , takes xanax prn Fu 3-63m  , Shannon Capron, MD 09/04/2021

## 2021-09-05 ENCOUNTER — Other Ambulatory Visit (HOSPITAL_BASED_OUTPATIENT_CLINIC_OR_DEPARTMENT_OTHER): Payer: Self-pay

## 2021-09-10 ENCOUNTER — Other Ambulatory Visit: Payer: Self-pay | Admitting: Sports Medicine

## 2021-09-10 ENCOUNTER — Other Ambulatory Visit (HOSPITAL_BASED_OUTPATIENT_CLINIC_OR_DEPARTMENT_OTHER): Payer: Self-pay

## 2021-09-10 DIAGNOSIS — M7918 Myalgia, other site: Secondary | ICD-10-CM

## 2021-09-10 MED ORDER — PREGABALIN 100 MG PO CAPS
ORAL_CAPSULE | Freq: Two times a day (BID) | ORAL | 3 refills | Status: DC
Start: 2021-09-10 — End: 2022-02-11
  Filled 2021-09-10: qty 60, 30d supply, fill #0
  Filled 2021-10-04 – 2021-11-01 (×2): qty 60, 30d supply, fill #1
  Filled 2021-12-05: qty 60, 30d supply, fill #2
  Filled 2022-01-09: qty 60, 30d supply, fill #3

## 2021-09-10 NOTE — Telephone Encounter (Signed)
Spoke with patient, she is aware she needs an appt and was transferred to the front desk to schedule.

## 2021-09-10 NOTE — Telephone Encounter (Signed)
Last OV 03/2020 Last fill 04/2021 Need OV

## 2021-09-18 ENCOUNTER — Other Ambulatory Visit (HOSPITAL_BASED_OUTPATIENT_CLINIC_OR_DEPARTMENT_OTHER): Payer: Self-pay

## 2021-09-18 ENCOUNTER — Ambulatory Visit: Payer: 59 | Admitting: Physician Assistant

## 2021-09-18 ENCOUNTER — Encounter: Payer: Self-pay | Admitting: Physician Assistant

## 2021-09-18 ENCOUNTER — Other Ambulatory Visit: Payer: Self-pay

## 2021-09-18 VITALS — BP 137/72 | HR 85 | Temp 99.2°F | Wt 218.1 lb

## 2021-09-18 DIAGNOSIS — N3281 Overactive bladder: Secondary | ICD-10-CM | POA: Diagnosis not present

## 2021-09-18 DIAGNOSIS — E559 Vitamin D deficiency, unspecified: Secondary | ICD-10-CM

## 2021-09-18 DIAGNOSIS — Z9989 Dependence on other enabling machines and devices: Secondary | ICD-10-CM

## 2021-09-18 DIAGNOSIS — Z6837 Body mass index (BMI) 37.0-37.9, adult: Secondary | ICD-10-CM

## 2021-09-18 DIAGNOSIS — R8290 Unspecified abnormal findings in urine: Secondary | ICD-10-CM | POA: Diagnosis not present

## 2021-09-18 DIAGNOSIS — E1121 Type 2 diabetes mellitus with diabetic nephropathy: Secondary | ICD-10-CM

## 2021-09-18 DIAGNOSIS — R413 Other amnesia: Secondary | ICD-10-CM | POA: Diagnosis not present

## 2021-09-18 DIAGNOSIS — Z23 Encounter for immunization: Secondary | ICD-10-CM | POA: Diagnosis not present

## 2021-09-18 DIAGNOSIS — E039 Hypothyroidism, unspecified: Secondary | ICD-10-CM | POA: Diagnosis not present

## 2021-09-18 DIAGNOSIS — G4733 Obstructive sleep apnea (adult) (pediatric): Secondary | ICD-10-CM | POA: Diagnosis not present

## 2021-09-18 LAB — POCT URINALYSIS DIP (CLINITEK)
Bilirubin, UA: NEGATIVE
Blood, UA: NEGATIVE
Glucose, UA: NEGATIVE mg/dL
Ketones, POC UA: NEGATIVE mg/dL
Nitrite, UA: NEGATIVE
POC PROTEIN,UA: NEGATIVE
Spec Grav, UA: 1.025 (ref 1.010–1.025)
Urobilinogen, UA: 0.2 E.U./dL
pH, UA: 6 (ref 5.0–8.0)

## 2021-09-18 LAB — POCT GLYCOSYLATED HEMOGLOBIN (HGB A1C): Hemoglobin A1C: 5.9 % — AB (ref 4.0–5.6)

## 2021-09-18 LAB — WET PREP FOR TRICH, YEAST, CLUE
MICRO NUMBER:: 12696040
Specimen Quality: ADEQUATE

## 2021-09-18 MED ORDER — VITAMIN D (ERGOCALCIFEROL) 1.25 MG (50000 UNIT) PO CAPS
ORAL_CAPSULE | ORAL | 0 refills | Status: DC
  Filled 2021-09-18: qty 12, fill #0
  Filled 2021-11-26: qty 12, 84d supply, fill #0

## 2021-09-18 MED ORDER — OZEMPIC (1 MG/DOSE) 4 MG/3ML ~~LOC~~ SOPN
1.0000 mg | PEN_INJECTOR | SUBCUTANEOUS | 0 refills | Status: DC
  Filled 2021-09-18 – 2022-01-29 (×2): qty 9, 84d supply, fill #0

## 2021-09-18 NOTE — Progress Notes (Signed)
 Subjective:    Patient ID: Shannon [name], female    DOB: 12/31/1965, 55 y.o.   MRN: [id number]  HPI Patient is a 55-year-old obese female with type 2 diabetes, hypothyroidism, OSA who presents to the clinic for 3-month follow-up  Patient is not checking her sugars.  She is on Ozempic.  She denies any hypoglycemic events.  She denies any open sores or wounds.  She denies any chest pain, palpitations, headache or vision changes.  Patient is sleeping well with her CPAP.  Patient has noticed some urine odor.  Last time she noticed this she did have strep in her urine that was treated with an antibiotic.  She is worried it has come back.  She denies any lower abdominal pain or flank pain.  She denies any fever, chills, nausea or vomiting.   She is concerned about her memory.  She feels like she is forgetting things more easily.  She feels like her family is noticing this.  She has not made any mistakes at work but she has forgotten many things and had to go back to it.  She feels like this is been worsening since August.  She does report that August is when she had COVID.  Most of her memory concerns are around misplacing items or forgetting to do something.   .. 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   Cervical myofascial pain syndrome 07/31/2016   Fibroids 10/30/2016   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   Iron deficiency anemia secondary to  inadequate dietary iron intake 03/25/2018   Chest pain 05/09/2018   BPPV (benign paroxysmal positional vertigo), right 05/09/2018   Polyp of colon 05/21/2018   Primary osteoarthritis of right knee 10/06/2018   Acute lateral meniscus tear of right knee 03/17/2019   Loss of balance 05/24/2019   Vertigo 05/24/2019   OAB (overactive bladder) 11/23/2019   Fever 08/13/2020   Fatigue 08/13/2020   Family history of leukemia 08/14/2020   SOB (shortness of breath) on exertion 08/14/2020   Bilateral lower extremity edema 08/14/2020   Elevated platelet count 08/14/2020   Digital mucinous cyst of right thumb 10/23/2020   Vitamin D deficiency 11/27/2020   Unilateral primary osteoarthritis, right knee 02/06/2021   Hematuria 06/18/2021   Abnormal urine odor 06/19/2021   Resolved Ambulatory Problems    Diagnosis Date Noted   Unspecified otitis media 11/22/2008   Acute sinusitis, unspecified 11/22/2008   URI 09/28/2009   BACK PAIN, LUMBAR 12/26/2008   Burning sensation of the foot 08/28/2014   Numbness of both lower extremities 08/28/2014   BMI 38.0-38.9,adult 12/29/2014   Status post gastric surgery 11/12/2015   Past Medical History:  Diagnosis Date   Anxiety    Arthritis    Depression    Diabetes mellitus type II, controlled (HCC) 08/15/2014   GERD (gastroesophageal reflux disease)    HPV (human papilloma virus) infection    Lateral meniscus tear    PONV (  postoperative nausea and vomiting)    Pre-diabetes    Shortness of breath dyspnea    Stress incontinence      Review of Systems    See HPI.  Objective:   Physical Exam Vitals reviewed.  Constitutional:      Appearance: Normal appearance. She is obese.  HENT:     Head: Normocephalic.  Neck:     Vascular: No carotid bruit.  Cardiovascular:     Rate and Rhythm: Normal rate and regular rhythm.     Pulses: Normal pulses.     Heart sounds: Normal heart sounds.  Pulmonary:     Effort: Pulmonary effort is normal.     Breath  sounds: Normal breath sounds.  Musculoskeletal:     Right lower leg: No edema.     Left lower leg: No edema.  Neurological:     General: No focal deficit present.     Mental Status: She is alert and oriented to person, place, and time.  Psychiatric:        Mood and Affect: Mood normal.      .. Results for orders placed or performed in visit on 09/18/21  WET PREP FOR [place], YEAST, CLUE   Specimen: Vaginal; Sterile Swab  Result Value Ref Range   MICRO NUMBER: [id number]    Specimen Quality Adequate    SOURCE: VAGINAL    Status FINAL    RESULT      No Trichomonas vaginalis seen. No yeast seen No clue cells seen Epithelial Cells Present  Urine Culture   Specimen: Urine  Result Value Ref Range   MICRO NUMBER: [id number]    SPECIMEN QUALITY: Adequate    Sample Source NOT GIVEN    STATUS: FINAL    ISOLATE 1: Streptococcus agalactiae (A)   COMPLETE METABOLIC PANEL WITH GFR  Result Value Ref Range   Glucose, Bld 95 65 - 99 mg/dL   BUN 18 7 - 25 mg/dL   Creat 0.74 0.50 - 1.03 mg/dL   eGFR 97 > OR = 60 mL/min/1.75m   BUN/Creatinine Ratio NOT APPLICABLE 6 - 22 (calc)   Sodium 140 135 - 146 mmol/L   Potassium 4.4 3.5 - 5.3 mmol/L   Chloride 105 98 - 110 mmol/L   CO2 27 20 - 32 mmol/L   Calcium 9.4 8.6 - 10.4 mg/dL   Total Protein 7.1 6.1 - 8.1 g/dL   Albumin 4.6 3.6 - 5.1 g/dL   Globulin 2.5 1.9 - 3.7 g/dL (calc)   AG Ratio 1.8 1.0 - 2.5 (calc)   Total Bilirubin 0.4 0.2 - 1.2 mg/dL   Alkaline phosphatase (APISO) 94 37 - 153 U/L   AST 16 10 - 35 U/L   ALT 15 6 - 29 U/L  VITAMIN D 25 Hydroxy (Vit-D Deficiency, Fractures)  Result Value Ref Range   Vit D, 25-Hydroxy 57 30 - 100 ng/mL  Fe+TIBC+Fer  Result Value Ref Range   Iron 63 45 - 160 mcg/dL   TIBC 482 (H) 250 - 450 mcg/dL (calc)   %SAT 13 (L) 16 - 45 % (calc)   Ferritin 6 (L) 16 - 232 ng/mL  FSH/LH  Result Value Ref Range   FSH 40.2 mIU/mL   LH 26.8 mIU/mL  Estradiol  Result Value Ref Range   Estradiol 34 pg/mL   RPR  Result Value Ref Range   RPR Ser Ql NON-REACTIVE NON-REACTIVE  Vitamin B12  Result Value Ref Range   Vitamin B-12 412 200 - 1,100 pg/mL  Sedimentation rate  Result Value Ref Range   Sed Rate 14 0 - 30 mm/h  CBC  Result Value Ref Range   WBC 8.9 3.8 - 10.8 Thousand/uL   RBC 4.84 3.80 - 5.10 Million/uL   Hemoglobin 13.3 11.7 - 15.5 g/dL   HCT 40.6 35.0 - 45.0 %   MCV 83.9 80.0 - 100.0 fL   MCH 27.5 27.0 - 33.0 pg   MCHC 32.8 32.0 - 36.0 g/dL   RDW 14.2 11.0 - 15.0 %   Platelets 568 (H) 140 - 400 Thousand/uL   MPV 9.0 7.5 - 12.5 fL  TSH  Result Value Ref Range   TSH 1.64 mIU/L  Folate  Result Value Ref Range   Folate 14.2 ng/mL  Urinalysis, Routine w reflex microscopic  Result Value Ref Range   Color, Urine YELLOW YELLOW   APPearance CLEAR CLEAR   Specific Gravity, Urine 1.023 1.001 - 1.035   pH 6.0 5.0 - 8.0   Glucose, UA NEGATIVE NEGATIVE   Bilirubin Urine NEGATIVE NEGATIVE   Ketones, ur TRACE (A) NEGATIVE   Hgb urine dipstick NEGATIVE NEGATIVE   Protein, ur NEGATIVE NEGATIVE   Nitrite NEGATIVE NEGATIVE   Leukocytes,Ua 1+ (A) NEGATIVE   WBC, UA 0-5 0 - 5 /HPF   RBC / HPF NONE SEEN 0 - 2 /HPF   Squamous Epithelial / LPF 0-5 < OR = 5 /HPF   Bacteria, UA NONE SEEN NONE SEEN /HPF   Hyaline Cast NONE SEEN NONE SEEN /LPF  POCT glycosylated hemoglobin (Hb A1C)  Result Value Ref Range   Hemoglobin A1C 5.9 (A) 4.0 - 5.6 %   HbA1c POC (<> result, manual entry)     HbA1c, POC (prediabetic range)     HbA1c, POC (controlled diabetic range)    POCT URINALYSIS DIP (CLINITEK)  Result Value Ref Range   Color, UA yellow yellow   Clarity, UA clear clear   Glucose, UA negative negative mg/dL   Bilirubin, UA negative negative   Ketones, POC UA negative negative mg/dL   Spec Grav, UA 1.025 1.010 - 1.025   Blood, UA negative negative   pH, UA 6.0 5.0 - 8.0   POC PROTEIN,UA negative negative, trace   Urobilinogen, UA 0.2 0.2 or 1.0 E.U./dL   Nitrite, UA Negative Negative    Leukocytes, UA Trace (A) Negative          Assessment & Plan:  ..Audine was seen today for diabetes.  Diagnoses and all orders for this visit:  Type II diabetes mellitus with nephropathy (HCC) -     POCT glycosylated hemoglobin (Hb A1C) -     Semaglutide, 1 MG/DOSE, (OZEMPIC, 1 MG/DOSE,) 4 MG/3ML SOPN; Inject 1 mg into the skin once a week.  Hypothyroidism, unspecified type  OSA on CPAP  OAB (overactive bladder) -     WET PREP FOR TRICH, YEAST, CLUE -     POCT URINALYSIS DIP (CLINITEK) -     Urinalysis, Routine w reflex microscopic -     Urine Culture -     MICROSCOPIC MESSAGE  Class 2 severe obesity due to excess calories with serious comorbidity and body mass index (BMI) of 37.0 to 37.9 in adult (HCC) -     Semaglutide, 1 MG/DOSE, (OZEMPIC, 1 MG/DOSE,) 4 MG/3ML SOPN; Inject 1 mg into the skin once a week.  Vitamin D deficiency -     VITAMIN D 25 Hydroxy (Vit-D Deficiency, Fractures) -     Vitamin D, Ergocalciferol, (DRISDOL) 1.25   MG (50000 UNIT) CAPS capsule; TAKE 1 CAPSULE (50,000 UNITS TOTAL) BY MOUTH EVERY 7 (SEVEN) DAYS.  Memory changes -     COMPLETE METABOLIC PANEL WITH GFR -     VITAMIN D 25 Hydroxy (Vit-D Deficiency, Fractures) -     Fe+TIBC+Fer -     FSH/LH -     Estradiol -     RPR -     Vitamin B12 -     Sedimentation rate -     CBC -     TSH -     Folate  Need for shingles vaccine  Bad odor of urine -     WET PREP FOR TRICH, YEAST, CLUE -     POCT URINALYSIS DIP (CLINITEK) -     Urinalysis, Routine w reflex microscopic -     Urine Culture -     MICROSCOPIC MESSAGE -     cephALEXin (KEFLEX) 500 MG capsule; Take 1 capsule (500 mg total) by mouth 2 (two) times daily. For 7 days.  Other orders -     Cancel: Varicella-zoster vaccine subcutaneous -     Varicella-zoster vaccine IM (Shingrix)  A1C is to goal.  Continue same medications.  On statin.  BP very close to goal.  Needs eye exam. Pt said she has scheduled.  Flu/pneumonia/covid vaccines  UTD.  Shingles vaccine started today.   UA positive for leuks.  Will culture.  Hx of UTIs with odor.  Discussed symptomatic treatment.   Memory changes.  MMSE was done and 28 which was very good.  Reassured patient.  Will get labs.  Could be some post viral/covid memory changes.  Will recheck in 3 months.    Spent 45 minutes with patient reviewing chart, ordering labs, conducting screening test, discussing medications, and going over treatment plan.  

## 2021-09-19 ENCOUNTER — Ambulatory Visit: Payer: 59 | Admitting: Sports Medicine

## 2021-09-19 DIAGNOSIS — M7918 Myalgia, other site: Secondary | ICD-10-CM | POA: Diagnosis not present

## 2021-09-19 LAB — COMPLETE METABOLIC PANEL WITH GFR
AG Ratio: 1.8 (calc) (ref 1.0–2.5)
ALT: 15 U/L (ref 6–29)
AST: 16 U/L (ref 10–35)
Albumin: 4.6 g/dL (ref 3.6–5.1)
Alkaline phosphatase (APISO): 94 U/L (ref 37–153)
BUN: 18 mg/dL (ref 7–25)
CO2: 27 mmol/L (ref 20–32)
Calcium: 9.4 mg/dL (ref 8.6–10.4)
Chloride: 105 mmol/L (ref 98–110)
Creat: 0.74 mg/dL (ref 0.50–1.03)
Globulin: 2.5 g/dL (calc) (ref 1.9–3.7)
Glucose, Bld: 95 mg/dL (ref 65–99)
Potassium: 4.4 mmol/L (ref 3.5–5.3)
Sodium: 140 mmol/L (ref 135–146)
Total Bilirubin: 0.4 mg/dL (ref 0.2–1.2)
Total Protein: 7.1 g/dL (ref 6.1–8.1)
eGFR: 97 mL/min/{1.73_m2} (ref >=60)

## 2021-09-19 LAB — VITAMIN D 25 HYDROXY (VIT D DEFICIENCY, FRACTURES): Vit D, 25-Hydroxy: 57 ng/mL (ref 30–100)

## 2021-09-19 LAB — FOLATE: Folate: 14.2 ng/mL

## 2021-09-19 LAB — RPR: RPR Ser Ql: NONREACTIVE

## 2021-09-19 LAB — IRON,TIBC AND FERRITIN PANEL
%SAT: 13 % (calc) — ABNORMAL LOW (ref 16–45)
Ferritin: 6 ng/mL — ABNORMAL LOW (ref 16–232)
Iron: 63 ug/dL (ref 45–160)
TIBC: 482 mcg/dL (calc) — ABNORMAL HIGH (ref 250–450)

## 2021-09-19 LAB — CBC
HCT: 40.6 % (ref 35.0–45.0)
Hemoglobin: 13.3 g/dL (ref 11.7–15.5)
MCH: 27.5 pg (ref 27.0–33.0)
MCHC: 32.8 g/dL (ref 32.0–36.0)
MCV: 83.9 fL (ref 80.0–100.0)
MPV: 9 fL (ref 7.5–12.5)
Platelets: 568 10*3/uL — ABNORMAL HIGH (ref 140–400)
RBC: 4.84 10*6/uL (ref 3.80–5.10)
RDW: 14.2 % (ref 11.0–15.0)
WBC: 8.9 10*3/uL (ref 3.8–10.8)

## 2021-09-19 LAB — SEDIMENTATION RATE: Sed Rate: 14 mm/h (ref 0–30)

## 2021-09-19 LAB — TSH: TSH: 1.64 mIU/L

## 2021-09-19 LAB — ESTRADIOL: Estradiol: 34 pg/mL

## 2021-09-19 LAB — FSH/LH
FSH: 40.2 m[IU]/mL
LH: 26.8 m[IU]/mL

## 2021-09-19 LAB — VITAMIN B12: Vitamin B-12: 412 pg/mL (ref 200–1100)

## 2021-09-19 NOTE — Progress Notes (Signed)
    Procedures performed today:    Procedure:  Injection of #3 right-sided paracervical, trapezial and periscapular trigger points Consent obtained and verified. Time-out conducted. Noted no overlying erythema, induration, or other signs of local infection. Skin prepped in a sterile fashion. Topical analgesic spray: Ethyl chloride. Completed without difficulty. Meds: A total of 1 cc kenalog 40, 4 cc lidocaine spread out between the 3 trigger points. Advised to call if fevers/chills, erythema, induration, drainage, or persistent bleeding.  Independent interpretation of notes and tests performed by another provider:   None.  Brief History, Exam, Impression, and Recommendations:    Cervical myofascial pain syndrome Media is a very pleasant 55 year old female, she has known cervical myofascial pain syndrome, MRI was for the most part negative back in 2018, also currently on Celexa, Viibryd, Lyrica, Lyrica dose is at max tolerable right now with drowsiness at higher dosages. She is having increasing pain right paracervical, trapezial, and periscapular. I did give the options of trigger point injections today versus formal physical therapy, we are proceeding with trigger point injections into the right paracervical, trapezial and periscapular regions. I also showed her how to do home traction. Home physical therapy provided. Return to see me if not significantly better in 3 to 4 weeks.  Chronic process with exacerbation and pharmacologic intervention  ___________________________________________ Gwen Her. Dianah Field, M.D., ABFM., CAQSM. Primary Care and [place] Instructor of [place] [employer] [facility] [employer]

## 2021-09-19 NOTE — Assessment & Plan Note (Addendum)
Shannon [name] is a very pleasant 55 year old female, she has known cervical myofascial pain syndrome, MRI was for the most part negative back in 2018, also currently on Celexa, Viibryd, Lyrica, Lyrica dose is at max tolerable right now with drowsiness at higher dosages. She is having increasing pain right paracervical, trapezial, and periscapular. I did give the options of trigger point injections today versus formal physical therapy, we are proceeding with trigger point injections into the right paracervical, trapezial and periscapular regions. I also showed her how to do home traction. Home physical therapy provided. Return to see me if not significantly better in 3 to 4 weeks.

## 2021-09-20 ENCOUNTER — Other Ambulatory Visit (HOSPITAL_BASED_OUTPATIENT_CLINIC_OR_DEPARTMENT_OTHER): Payer: Self-pay

## 2021-09-20 LAB — URINALYSIS, ROUTINE W REFLEX MICROSCOPIC
Bacteria, UA: NONE SEEN /HPF
Bilirubin Urine: NEGATIVE
Glucose, UA: NEGATIVE
Hgb urine dipstick: NEGATIVE
Hyaline Cast: NONE SEEN /LPF
Nitrite: NEGATIVE
Protein, ur: NEGATIVE
RBC / HPF: NONE SEEN /HPF (ref 0–2)
Specific Gravity, Urine: 1.023 (ref 1.001–1.035)
pH: 6 (ref 5.0–8.0)

## 2021-09-20 LAB — URINE CULTURE
MICRO NUMBER:: 12699084
SPECIMEN QUALITY:: ADEQUATE

## 2021-09-20 MED ORDER — CEPHALEXIN 500 MG PO CAPS: 500.0000 mg | ORAL_CAPSULE | Freq: Two times a day (BID) | ORAL | 0 refills | Status: DC

## 2021-09-20 NOTE — Progress Notes (Signed)
Sent keflex for strep found in urine. Take twice a day for 7 days.

## 2021-09-20 NOTE — Progress Notes (Signed)
THANK YOU. So much good information.

## 2021-09-20 NOTE — Progress Notes (Signed)
What do you think about this? Pt is keeping some low level urine odor and symptoms with Strep agalactiae but in low colonies and bacteria is not showing up in microscopic. She was treated about 1 month ago for similar results with PcN.

## 2021-09-25 ENCOUNTER — Other Ambulatory Visit (HOSPITAL_BASED_OUTPATIENT_CLINIC_OR_DEPARTMENT_OTHER): Payer: Self-pay

## 2021-09-26 ENCOUNTER — Other Ambulatory Visit: Payer: Self-pay | Admitting: Neurology

## 2021-09-26 ENCOUNTER — Other Ambulatory Visit (HOSPITAL_BASED_OUTPATIENT_CLINIC_OR_DEPARTMENT_OTHER): Payer: Self-pay

## 2021-09-26 DIAGNOSIS — R413 Other amnesia: Secondary | ICD-10-CM

## 2021-09-26 DIAGNOSIS — R8290 Unspecified abnormal findings in urine: Secondary | ICD-10-CM

## 2021-09-26 MED ORDER — CEPHALEXIN 500 MG PO CAPS
500.0000 mg | ORAL_CAPSULE | Freq: Two times a day (BID) | ORAL | 0 refills | Status: DC
  Filled 2021-09-26: qty 14, 7d supply, fill #0

## 2021-09-26 NOTE — Progress Notes (Signed)
Patient called with questions about blood work.   I did let her know information below:   [name], PA-C  09/20/2021  1:03 PM EST     Sent keflex for strep found in urine. Take twice a day for 7 days.    [name], PA-C  09/20/2021  1:02 PM EST     THANK YOU. So much good information.    [name], PA-C  09/20/2021  5:12 AM EST     What do you think about this? Pt is keeping some low level urine odor and symptoms with Strep agalactiae but in low colonies and bacteria is not showing up in microscopic. She was treated about 1 month ago for similar results with PcN.    She was interested in blood work results and what to do about iron. Please advise.

## 2021-09-27 NOTE — Progress Notes (Signed)
Patient made aware of results. She is not currently taking Iron, advised to start OTC Iron once daily.   She is interested in treatment for menopause symptoms. She states main symptoms are moodiness and irritability. She is also having some hot flashes and occasional headaches. She will do whatever treatment you recommend. Please advise.

## 2021-09-27 NOTE — Progress Notes (Signed)
Vitamin D and b12 look great.  Iron stores are low. Are you taking any iron?  You are certainly in menopause. Which can come with many symptoms. If you want to start treating some of them or consider hormones we can talk about it.

## 2021-09-30 NOTE — Progress Notes (Signed)
Patient also called and left vm checking on status of insurance approval for a CT scan. I don't see a CT scan ordered. Is this something you were going to order?

## 2021-10-02 ENCOUNTER — Encounter: Payer: Self-pay | Admitting: Physician Assistant

## 2021-10-02 NOTE — Progress Notes (Signed)
Ordered

## 2021-10-02 NOTE — Addendum Note (Signed)
Addended by: [name] on: 10/02/2021 03:42 PM   Modules accepted: Orders

## 2021-10-02 NOTE — Progress Notes (Signed)
GYNECOLOGY OFFICE VISIT NOTE  History:   Shannon [name] is a 55 y.o. G1P1001 here today for IUD removal but notes vaginal irritation that has been off/on for one year. It is itching primarily and some burning. She has tried boric acid for a couple weeks but had no relief. .   She denies any abnormal vaginal discharge, bleeding, pelvic pain or other concerns.     Past Medical History:  Diagnosis Date   Anxiety    Arthritis    back and hips    Depression    Diabetes mellitus type II, controlled ([place]) 08/15/2014   GERD (gastroesophageal reflux disease)    HPV (human papilloma virus) infection    Hyperlipidemia    Lateral meniscus tear    left knee   PONV (postoperative nausea and vomiting)    slow to wake up    Pre-diabetes    Shortness of breath dyspnea    with exertion    Sleep apnea    cpap -0 setting at 16, uses CPAP nightly   Stress incontinence     Past Surgical History:  Procedure Laterality Date   APPENDECTOMY     BREATH TEK H PYLORI N/A 04/03/2015   Procedure: BREATH TEK H PYLORI;  Surgeon: Greer Pickerel, MD;  Location: [facility];  Service: General;  Laterality: N/A;   CESAREAN [place] Right 04/04/99   second interspace, right foot   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   second interspace, left foot   feet surgery     KNEE ARTHROSCOPY Right 03/17/2019   Procedure: RIGHT KNEE ARTHROSCOPY WITH PARTIAL LATERAL MENISCECTOMY, REMOVAL OF LOOSE BODIES, 3 COMPARTMENT CHONDROPLASTY;  Surgeon: Mcarthur Rossetti, MD;  Location: [facility];  Service: Orthopedics;  Laterality: Right;   LAPAROSCOPIC GASTRIC SLEEVE RESECTION WITH HIATAL HERNIA REPAIR N/A 08/13/2015   Procedure: LAPAROSCOPIC GASTRIC SLEEVE RESECTION WITH HIATAL HERNIA REPAIR;  Surgeon: Greer Pickerel, MD;  Location: [facility];  Service: General;  Laterality: N/A;   laproscopy     UPPER GI ENDOSCOPY  08/13/2015   Procedure: UPPER GI ENDOSCOPY;  Surgeon: Greer Pickerel,  MD;  Location: [facility];  Service: General;;   WISDOM TOOTH EXTRACTION      The following portions of the patient's history were reviewed and updated as appropriate: allergies, current medications, past family history, past medical history, past social history, past surgical history and problem list.   Health Maintenance:   Diagnosis  Date Value Ref Range Status  11/28/2020   Final   - Negative for intraepithelial lesion or malignancy (NILM)     Normal mammogram on 11/2020.   Review of Systems:  Pertinent items noted in HPI and remainder of comprehensive ROS otherwise negative.  Physical Exam:  BP 132/78    Pulse 87    Resp 16    Ht 5' 5.5" (1.664 m)    Wt 217 lb (98.4 kg)    BMI 35.56 kg/m  CONSTITUTIONAL: Well-developed, well-nourished female in no acute distress.  HEENT:  Normocephalic, atraumatic. External right and left ear normal. No scleral icterus.  NECK: Normal range of motion, supple, no masses noted on observation SKIN: No rash noted. Not diaphoretic. No erythema. No pallor. MUSCULOSKELETAL: Normal range of motion. No edema noted. NEUROLOGIC: Alert and oriented to person, place, and time. Normal muscle tone coordination. No cranial nerve deficit noted. PSYCHIATRIC: Normal mood and affect. Normal behavior. Normal judgment and thought content.  CARDIOVASCULAR: Normal heart rate noted  RESPIRATORY: Effort and breath sounds normal, no problems with respiration noted ABDOMEN: No masses noted. No other overt distention noted.    PELVIC: Normal appearing external genitalia; normal urethral meatus; vaginal mucosa especially close to introitus has areas of redness and erosion. normal appearing cervix.  White thick discharge noted. IUD strings seen.  Normal uterine size, no other palpable masses, no uterine or adnexal tenderness. Performed in the presence of a chaperone  Labs and Imaging No results found for this or any previous visit (from the past 168 hour(s)). No results found.     GYNECOLOGY OFFICE PROCEDURE NOTE  Shannon [name] is a 55 y.o. G1P1001 here for IUD removal. No GYN concerns.  Last pap smear was on 11/2020 and was normal. [place] in November was postmenopausal.   IUD Removal  Patient identified, informed consent performed, consent signed.  Patient was in the dorsal lithotomy position, normal external genitalia was noted.  A speculum was placed in the patient's vagina, normal discharge was noted, no lesions. The cervix was visualized, no lesions, no abnormal discharge.  The strings of the IUD were grasped and pulled using ring forceps. The IUD was removed in its entirety.   Patient tolerated the procedure well.   Assessment and Plan:    1. Vaginal irritation - Discussed Replens for vulvar comfort - Discussed OTC cleansers for vulvar hygiene - Cultures done to check for yeast and BV - We discussed if cx negative and replens and cleansers don't help, would also consider vaginal estrogen. We discussed the pros/cons of vaginal estrogen.  - Cervicovaginal ancillary only( [place])  2. IUD removal - She is postmenopausal - IUD removed without issue.   Routine preventative health maintenance measures emphasized. Please refer to After Visit Summary for other counseling recommendations.   No follow-ups on file.  Radene Gunning, MD, [place] for [facility], [place]

## 2021-10-02 NOTE — Progress Notes (Signed)
Amber, let me know when CT authorized so I can let patient know.

## 2021-10-03 ENCOUNTER — Other Ambulatory Visit: Payer: Self-pay

## 2021-10-03 ENCOUNTER — Encounter: Payer: Self-pay | Admitting: Obstetrics and Gynecology

## 2021-10-03 ENCOUNTER — Ambulatory Visit (INDEPENDENT_AMBULATORY_CARE_PROVIDER_SITE_OTHER): Payer: 59 | Admitting: Obstetrics and Gynecology

## 2021-10-03 ENCOUNTER — Other Ambulatory Visit (HOSPITAL_COMMUNITY)
Admission: RE | Admit: 2021-10-03 | Discharge: 2021-10-03 | Disposition: A | Discharge status: Home / Self Care | Payer: 59 | Source: Ambulatory Visit | Attending: Obstetrics and Gynecology | Admitting: Obstetrics and Gynecology

## 2021-10-03 ENCOUNTER — Ambulatory Visit (INDEPENDENT_AMBULATORY_CARE_PROVIDER_SITE_OTHER): Payer: 59

## 2021-10-03 VITALS — BP 132/78 | HR 87 | Resp 16 | Ht 65.5 in | Wt 217.0 lb

## 2021-10-03 DIAGNOSIS — N898 Other specified noninflammatory disorders of vagina: Secondary | ICD-10-CM

## 2021-10-03 DIAGNOSIS — R413 Other amnesia: Secondary | ICD-10-CM

## 2021-10-03 DIAGNOSIS — Z30432 Encounter for removal of intrauterine contraceptive device: Secondary | ICD-10-CM | POA: Diagnosis not present

## 2021-10-03 NOTE — Patient Instructions (Signed)
Replens vaginal moisturizer  Cerave or Cetaphil cleanser (can use on whole body)

## 2021-10-04 ENCOUNTER — Other Ambulatory Visit (HOSPITAL_BASED_OUTPATIENT_CLINIC_OR_DEPARTMENT_OTHER): Payer: Self-pay

## 2021-10-04 LAB — CERVICOVAGINAL ANCILLARY ONLY
Bacterial Vaginitis (gardnerella): NEGATIVE
Candida Glabrata: NEGATIVE
Candida Vaginitis: NEGATIVE
Comment: NEGATIVE
Comment: NEGATIVE
Comment: NEGATIVE

## 2021-10-04 NOTE — Progress Notes (Signed)
Normal head CT.

## 2021-10-06 ENCOUNTER — Encounter: Payer: Self-pay | Admitting: Obstetrics and Gynecology

## 2021-10-08 ENCOUNTER — Other Ambulatory Visit (HOSPITAL_COMMUNITY): Payer: Self-pay | Admitting: Psychiatry

## 2021-10-08 ENCOUNTER — Other Ambulatory Visit (HOSPITAL_BASED_OUTPATIENT_CLINIC_OR_DEPARTMENT_OTHER): Payer: Self-pay

## 2021-10-08 MED ORDER — ALPRAZOLAM 0.5 MG PO TABS
ORAL_TABLET | ORAL | 0 refills | Status: DC
Start: 2021-10-08 — End: 2021-11-18
  Filled 2021-10-08: qty 30, fill #0
  Filled 2021-10-09: qty 30, 30d supply, fill #0

## 2021-10-09 ENCOUNTER — Telehealth: Payer: Self-pay | Admitting: Neurology

## 2021-10-09 ENCOUNTER — Other Ambulatory Visit (HOSPITAL_BASED_OUTPATIENT_CLINIC_OR_DEPARTMENT_OTHER): Payer: Self-pay

## 2021-10-09 DIAGNOSIS — R8290 Unspecified abnormal findings in urine: Secondary | ICD-10-CM

## 2021-10-09 NOTE — Telephone Encounter (Signed)
Patient made aware, she will come to lab to have done. Order entered.

## 2021-10-09 NOTE — Telephone Encounter (Signed)
Patient called and left vm stating she finished antibiotics for strep in urine, she is still having an odor. Okay to order a UA for patient to have done at lab?

## 2021-10-11 ENCOUNTER — Other Ambulatory Visit (HOSPITAL_BASED_OUTPATIENT_CLINIC_OR_DEPARTMENT_OTHER): Payer: Self-pay

## 2021-10-15 ENCOUNTER — Telehealth (INDEPENDENT_AMBULATORY_CARE_PROVIDER_SITE_OTHER): Payer: 59 | Admitting: Physician Assistant

## 2021-10-15 ENCOUNTER — Encounter: Payer: Self-pay | Admitting: Physician Assistant

## 2021-10-15 ENCOUNTER — Other Ambulatory Visit (HOSPITAL_BASED_OUTPATIENT_CLINIC_OR_DEPARTMENT_OTHER): Payer: Self-pay

## 2021-10-15 VITALS — Ht 65.5 in | Wt 217.0 lb

## 2021-10-15 DIAGNOSIS — N951 Menopausal and female climacteric states: Secondary | ICD-10-CM

## 2021-10-15 DIAGNOSIS — D508 Other iron deficiency anemias: Secondary | ICD-10-CM

## 2021-10-15 MED ORDER — ESTRADIOL-NORETHINDRONE ACET 1-0.5 MG PO TABS
1.0000 | ORAL_TABLET | Freq: Every day | ORAL | 1 refills | Status: DC
  Filled 2021-10-15: qty 84, 84d supply, fill #0

## 2021-10-15 NOTE — Progress Notes (Signed)
..  Virtual Visit via Telephone Note  I connected with Shannon [name] on 10/15/21 at  9:30 AM EST by telephone and verified that I am speaking with the correct person using two identifiers.  Location: Patient: home Provider: clinic  .Marland KitchenParticipating in visit:  Patient: Shannon [name] Provider: Iran Planas PA-C    I discussed the limitations, risks, security and privacy concerns of performing an evaluation and management service by telephone and the availability of in person appointments. I also discussed with the patient that there may be a patient responsible charge related to this service. The patient expressed understanding and agreed to proceed.   History of Present Illness: Pt is a 55 yo obese female who presents to the clinic to go over labs and discuss treatment.    Shannon Stade, PA-C at 09/26/2021 11:31 AM  Status: Signed  Vitamin D and b12 look great.  Iron stores are low. Are you taking any iron?  You are certainly in menopause. Which can come with many symptoms. If you want to start treating some of them or consider hormones we can talk about it.      Shannon [name], CMA at 09/26/2021 11:31 AM  Status: Signed  Patient made aware of results. She is not currently taking Iron, advised to start OTC Iron once daily.    She is interested in treatment for menopause symptoms. She states main symptoms are moodiness and irritability. She is also having some hot flashes and occasional headaches. She will do whatever treatment you recommend. Please advise.          Observations/Objective: No acute distress Normal breathing and mood   Assessment and Plan: Marland KitchenMarland KitchenAmy was seen today for menopause.  Diagnoses and all orders for this visit:  Menopausal symptoms -     estradiol-norethindrone (ACTIVELLA) 1-0.5 MG tablet; Take 1 tablet by mouth daily.  Iron deficiency anemia secondary to inadequate dietary iron intake  Pt will start oral iron with breakfast.   Discussed HRT and side  effects.  UTD mammogram.  No hx of PE/DVT/blood clots. Pt has uterus and will start combination therapy.  Follow up in 3 months.     Follow Up Instructions:    I discussed the assessment and treatment plan with the patient. The patient was provided an opportunity to ask questions and all were answered. The patient agreed with the plan and demonstrated an understanding of the instructions.   The patient was advised to call back or seek an in-person evaluation if the symptoms worsen or if the condition fails to improve as anticipated.  I provided 15 minutes of non-face-to-face time during this encounter.   Iran Planas, PA-C

## 2021-10-17 ENCOUNTER — Ambulatory Visit: Payer: 59 | Admitting: Sports Medicine

## 2021-10-28 ENCOUNTER — Encounter: Payer: Self-pay | Admitting: Physician Assistant

## 2021-10-28 DIAGNOSIS — N939 Abnormal uterine and vaginal bleeding, unspecified: Secondary | ICD-10-CM

## 2021-11-01 ENCOUNTER — Other Ambulatory Visit (HOSPITAL_COMMUNITY): Payer: Self-pay | Admitting: Psychiatry

## 2021-11-01 ENCOUNTER — Other Ambulatory Visit (HOSPITAL_BASED_OUTPATIENT_CLINIC_OR_DEPARTMENT_OTHER): Payer: Self-pay

## 2021-11-01 DIAGNOSIS — G4733 Obstructive sleep apnea (adult) (pediatric): Secondary | ICD-10-CM | POA: Diagnosis not present

## 2021-11-01 MED ORDER — VILAZODONE HCL 10 MG PO TABS
10.0000 mg | ORAL_TABLET | Freq: Every day | ORAL | 0 refills | Status: DC
  Filled 2021-11-01: qty 30, 30d supply, fill #0

## 2021-11-04 ENCOUNTER — Encounter: Payer: Self-pay | Admitting: Obstetrics and Gynecology

## 2021-11-05 ENCOUNTER — Other Ambulatory Visit: Payer: 59

## 2021-11-07 ENCOUNTER — Other Ambulatory Visit: Payer: Self-pay

## 2021-11-07 ENCOUNTER — Ambulatory Visit (INDEPENDENT_AMBULATORY_CARE_PROVIDER_SITE_OTHER): Payer: 59

## 2021-11-07 DIAGNOSIS — N888 Other specified noninflammatory disorders of cervix uteri: Secondary | ICD-10-CM | POA: Diagnosis not present

## 2021-11-07 DIAGNOSIS — N939 Abnormal uterine and vaginal bleeding, unspecified: Secondary | ICD-10-CM | POA: Diagnosis not present

## 2021-11-07 DIAGNOSIS — D251 Intramural leiomyoma of uterus: Secondary | ICD-10-CM | POA: Diagnosis not present

## 2021-11-08 ENCOUNTER — Encounter: Payer: Self-pay | Admitting: Physician Assistant

## 2021-11-08 ENCOUNTER — Encounter: Payer: Self-pay | Admitting: Obstetrics and Gynecology

## 2021-11-08 NOTE — Progress Notes (Signed)
You do have an enlarged uterus with multiple fibroids. I do think we should consult GYN and consider removal if causing issues now. Ok with this?

## 2021-11-12 ENCOUNTER — Other Ambulatory Visit: Payer: Self-pay

## 2021-11-12 ENCOUNTER — Telehealth: Payer: Self-pay

## 2021-11-12 ENCOUNTER — Ambulatory Visit: Payer: 59 | Admitting: Orthopaedic Surgery

## 2021-11-12 ENCOUNTER — Encounter: Payer: Self-pay | Admitting: Orthopaedic Surgery

## 2021-11-12 VITALS — Ht 65.5 in | Wt 223.0 lb

## 2021-11-12 DIAGNOSIS — M1711 Unilateral primary osteoarthritis, right knee: Secondary | ICD-10-CM | POA: Diagnosis not present

## 2021-11-12 DIAGNOSIS — R8290 Unspecified abnormal findings in urine: Secondary | ICD-10-CM | POA: Diagnosis not present

## 2021-11-12 MED ORDER — LIDOCAINE HCL 1 % IJ SOLN
3.0000 mL | INTRAMUSCULAR | Status: AC | PRN
  Administered 2021-11-12: 11:00:00 3 mL

## 2021-11-12 MED ORDER — METHYLPREDNISOLONE ACETATE 40 MG/ML IJ SUSP
40.0000 mg | INTRAMUSCULAR | Status: AC | PRN
  Administered 2021-11-12: 11:00:00 40 mg via INTRA_ARTICULAR

## 2021-11-12 NOTE — Telephone Encounter (Signed)
Please get auth for right knee gel injection-Shannon [name] pt

## 2021-11-12 NOTE — Progress Notes (Signed)
° °  Procedure Note  Patient: Shannon [name]             Date of Birth: 02/18/1966           MRN: [id number]             Visit Date: 11/12/2021 HPI: Mr. Iglesia is well-known to service comes in today requesting injection right knee.  She has known osteoarthritis with bone-on-bone lateral compartment she has had right knee pain that started about 8 weeks ago.  She is status post right knee Euflexxa injection 02/06/2021.  She has had no new injury.  She is to use Voltaren gel on her knees.  Denies any fevers chills.  She is diabetic reports her last hemoglobin A1c was 5.7.  Review of systems see HPI otherwise negative  Physical exam: General well-developed well-nourished female who ambulates without any assistive device. Right knee: Crepitus with passive range of motion.  Tenderness along the lateral joint line.  No abnormal warmth erythema or effusion.  Calf supple nontender.  Procedures: Visit Diagnoses:  1. Primary osteoarthritis of right knee     Large Joint Inj: R knee on 11/12/2021 10:46 AM Indications: pain Details: 22 G 1.5 in needle, anterolateral approach  Arthrogram: No  Medications: 3 mL lidocaine 1 %; 40 mg methylPREDNISolone acetate 40 MG/ML Outcome: tolerated well, no immediate complications Procedure, treatment alternatives, risks and benefits explained, specific risks discussed. Consent was given by the patient. Immediately prior to procedure a time out was called to verify the correct patient, procedure, equipment, support staff and site/side marked as required. Patient was prepped and draped in the usual sterile fashion.      Plan: We will work on getting approval for supplemental injection in the knee have her back once this is available.  She will continue to use Voltaren gel as needed.  Strengthening the quad muscles.  Questions were encouraged and answered.

## 2021-11-13 ENCOUNTER — Encounter: Payer: Self-pay | Admitting: Physician Assistant

## 2021-11-13 ENCOUNTER — Other Ambulatory Visit: Payer: Self-pay | Admitting: Physician Assistant

## 2021-11-13 DIAGNOSIS — R8290 Unspecified abnormal findings in urine: Secondary | ICD-10-CM

## 2021-11-13 DIAGNOSIS — A491 Streptococcal infection, unspecified site: Secondary | ICD-10-CM

## 2021-11-13 DIAGNOSIS — N390 Urinary tract infection, site not specified: Secondary | ICD-10-CM

## 2021-11-13 NOTE — Telephone Encounter (Signed)
Noted  

## 2021-11-13 NOTE — Progress Notes (Signed)
Your are keeping bacteria in urine. Are you having symptoms? I am making referral to urology.

## 2021-11-14 LAB — URINALYSIS, ROUTINE W REFLEX MICROSCOPIC
Bilirubin Urine: NEGATIVE
Glucose, UA: NEGATIVE
Hgb urine dipstick: NEGATIVE
Ketones, ur: NEGATIVE
Nitrite: NEGATIVE
Protein, ur: NEGATIVE
RBC / HPF: NONE SEEN /HPF (ref 0–2)
Specific Gravity, Urine: 1.025 (ref 1.001–1.035)
pH: 6 (ref 5.0–8.0)

## 2021-11-14 LAB — URINE CULTURE
MICRO NUMBER:: 12912547
Result:: NO GROWTH
SPECIMEN QUALITY:: ADEQUATE

## 2021-11-14 LAB — MICROSCOPIC MESSAGE

## 2021-11-18 ENCOUNTER — Other Ambulatory Visit (HOSPITAL_COMMUNITY): Payer: Self-pay | Admitting: Psychiatry

## 2021-11-18 ENCOUNTER — Other Ambulatory Visit (HOSPITAL_BASED_OUTPATIENT_CLINIC_OR_DEPARTMENT_OTHER): Payer: Self-pay

## 2021-11-18 MED FILL — Alprazolam Tab 0.5 MG: ORAL | 30 days supply | Qty: 30 | Fill #0 | Status: CN

## 2021-11-21 ENCOUNTER — Ambulatory Visit: Payer: 59 | Admitting: Obstetrics and Gynecology

## 2021-11-25 ENCOUNTER — Other Ambulatory Visit (HOSPITAL_BASED_OUTPATIENT_CLINIC_OR_DEPARTMENT_OTHER): Payer: Self-pay

## 2021-11-26 ENCOUNTER — Telehealth: Payer: Self-pay | Admitting: Orthopaedic Surgery

## 2021-11-26 ENCOUNTER — Other Ambulatory Visit (HOSPITAL_BASED_OUTPATIENT_CLINIC_OR_DEPARTMENT_OTHER): Payer: Self-pay

## 2021-11-26 NOTE — Telephone Encounter (Signed)
Patient called. She would like to know the status of her gel injections. Her call back number is [phone]

## 2021-11-27 ENCOUNTER — Telehealth: Payer: Self-pay

## 2021-11-27 LAB — HM DIABETES EYE EXAM

## 2021-11-27 NOTE — Telephone Encounter (Signed)
Talked with patient concerning gel injection.  Appointments have been scheduled.

## 2021-11-27 NOTE — Telephone Encounter (Signed)
VOB submitted for euflexxa, right knee. BV pending.

## 2021-11-29 ENCOUNTER — Telehealth: Payer: Self-pay

## 2021-11-29 ENCOUNTER — Other Ambulatory Visit (HOSPITAL_BASED_OUTPATIENT_CLINIC_OR_DEPARTMENT_OTHER): Payer: Self-pay

## 2021-11-29 MED FILL — Alprazolam Tab 0.5 MG: ORAL | 30 days supply | Qty: 30 | Fill #0 | Status: AC

## 2021-11-29 NOTE — Telephone Encounter (Signed)
Approved for Euflexxa series, right knee. [place] has been met Covered at 100% of the allowable after Co-pay Co-pay of $60.00, may be per visit No PA required  Appt.12/04/2021 with Artis Delay

## 2021-12-02 ENCOUNTER — Ambulatory Visit (INDEPENDENT_AMBULATORY_CARE_PROVIDER_SITE_OTHER): Payer: 59 | Admitting: Obstetrics & Gynecology

## 2021-12-02 ENCOUNTER — Encounter: Payer: Self-pay | Admitting: Obstetrics & Gynecology

## 2021-12-02 ENCOUNTER — Other Ambulatory Visit: Payer: Self-pay

## 2021-12-02 VITALS — BP 133/84 | HR 79 | Resp 16 | Ht 65.5 in | Wt 218.0 lb

## 2021-12-02 DIAGNOSIS — Z01419 Encounter for gynecological examination (general) (routine) without abnormal findings: Secondary | ICD-10-CM | POA: Diagnosis not present

## 2021-12-02 DIAGNOSIS — Z139 Encounter for screening, unspecified: Secondary | ICD-10-CM

## 2021-12-02 NOTE — Progress Notes (Signed)
Subjective:     Shannon [name] is a 56 y.o. female here for a routine exam.  Current complaints: periodic bloody mucous in her underwear.  She is seeing urologist this week for recurrent urinary tract infections.  Bloody mucoid discharge could be coming from her vagina or her urethra.  Will be interesting to see what the urologist find.  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  Her endometrium was thin.  We discussed the possibility of replacing an IUD versus submucous coastal fibroid resection and ablation.  Patient would need endometrial sampling prior to both.  Patient wants to wait until after the urology visit before making a follow-up appointment..    Gynecologic History Patient's last menstrual period was 12/17/2016 (exact date). Contraception: none Last Pap: 2022. 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  Last mammogram: 2/22. Results were: normal  Obstetric History OB History  Gravida Para Term Preterm AB Living  1 1 1     1   SAB IAB Ectopic Multiple Live Births               # Outcome Date GA Lbr Len/2nd Weight Sex Delivery Anes PTL Lv  1 Term              The following portions of the patient's history were reviewed and updated as appropriate: allergies, current medications, past family history, past medical history, past social history, past surgical history, and problem list.  Review of Systems Pertinent items noted in HPI and remainder of comprehensive ROS otherwise negative.    Objective:     Vitals:   12/02/21 1459  BP: 133/84  Pulse: 79  Resp: 16  Weight: 218 lb (98.9 kg)  Height: 5' 5.5" (1.664 m)   Vitals:  WNL General appearance: alert, cooperative and no distress  HEENT: Normocephalic, without obvious abnormality, atraumatic Eyes: negative Throat: lips, mucosa, and tongue normal; teeth and gums normal  Respiratory: Clear to auscultation bilaterally  CV: Regular rate and rhythm  Breasts:  Normal appearance, no  masses or tenderness, no nipple retraction or dimpling  GI: Soft, non-tender; bowel sounds normal; no masses,  no organomegaly  GU: External Genitalia:  Tanner V, no lesion Urethra:  No prolapse   Vagina: Pink, normal rugae, no blood or discharge  Cervix: No CMT, no lesion  Uterus:  Slight enlarged due to fibroids.  Exam limited by habitus.  Adnexa: Normal, no masses, non tender  Musculoskeletal: No edema, redness or tenderness in the calves or thighs  Skin: No lesions or rash  Lymphatic: Axillary adenopathy: none     Psychiatric: Normal mood and behavior        Assessment:    Healthy female exam.    Plan:   A.  Pap not due this year 2.  Yearly mammograms 3. Urology work up for recurrent UTI and possibly the etiology of bloody mucous.  Pt will follow up with [place] after the urology visit.  See history of present illness for more details. 4.  Rest of health care maintenance by PCP

## 2021-12-04 ENCOUNTER — Ambulatory Visit: Payer: 59 | Admitting: Physician Assistant

## 2021-12-05 ENCOUNTER — Other Ambulatory Visit (HOSPITAL_COMMUNITY): Payer: Self-pay | Admitting: Psychiatry

## 2021-12-05 ENCOUNTER — Telehealth (HOSPITAL_COMMUNITY): Payer: 59 | Admitting: Psychiatry

## 2021-12-05 DIAGNOSIS — F331 Major depressive disorder, recurrent, moderate: Secondary | ICD-10-CM

## 2021-12-05 DIAGNOSIS — R8279 Other abnormal findings on microbiological examination of urine: Secondary | ICD-10-CM | POA: Diagnosis not present

## 2021-12-06 ENCOUNTER — Other Ambulatory Visit (HOSPITAL_BASED_OUTPATIENT_CLINIC_OR_DEPARTMENT_OTHER): Payer: Self-pay

## 2021-12-06 MED ORDER — LAMOTRIGINE 200 MG PO TABS
ORAL_TABLET | Freq: Every day | ORAL | 2 refills | Status: DC
Start: 2021-12-06 — End: 2021-12-10
  Filled 2021-12-06: qty 30, 30d supply, fill #0

## 2021-12-06 MED ORDER — CITALOPRAM HYDROBROMIDE 40 MG PO TABS
ORAL_TABLET | Freq: Every day | ORAL | 2 refills | Status: DC
  Filled 2021-12-06: qty 30, 30d supply, fill #0
  Filled 2022-01-09: qty 30, 30d supply, fill #1
  Filled 2022-02-11: qty 30, 30d supply, fill #2

## 2021-12-06 MED ORDER — VILAZODONE HCL 10 MG PO TABS
10.0000 mg | ORAL_TABLET | Freq: Every day | ORAL | 0 refills | Status: DC
  Filled 2021-12-06: qty 30, 30d supply, fill #0

## 2021-12-09 ENCOUNTER — Ambulatory Visit: Payer: 59 | Admitting: Physician Assistant

## 2021-12-09 ENCOUNTER — Other Ambulatory Visit (HOSPITAL_BASED_OUTPATIENT_CLINIC_OR_DEPARTMENT_OTHER): Payer: Self-pay

## 2021-12-10 ENCOUNTER — Telehealth (INDEPENDENT_AMBULATORY_CARE_PROVIDER_SITE_OTHER): Payer: 59 | Admitting: Psychiatry

## 2021-12-10 ENCOUNTER — Encounter (HOSPITAL_COMMUNITY): Payer: Self-pay | Admitting: Psychiatry

## 2021-12-10 ENCOUNTER — Other Ambulatory Visit (HOSPITAL_BASED_OUTPATIENT_CLINIC_OR_DEPARTMENT_OTHER): Payer: Self-pay

## 2021-12-10 DIAGNOSIS — F410 Panic disorder [episodic paroxysmal anxiety] without agoraphobia: Secondary | ICD-10-CM | POA: Diagnosis not present

## 2021-12-10 DIAGNOSIS — F411 Generalized anxiety disorder: Secondary | ICD-10-CM | POA: Diagnosis not present

## 2021-12-10 DIAGNOSIS — F331 Major depressive disorder, recurrent, moderate: Secondary | ICD-10-CM

## 2021-12-10 MED ORDER — LAMOTRIGINE 200 MG PO TABS
ORAL_TABLET | Freq: Every day | ORAL | 2 refills | Status: DC
  Filled 2021-12-10: qty 30, fill #0
  Filled 2021-12-12: qty 30, 30d supply, fill #0
  Filled 2022-01-09: qty 30, 30d supply, fill #1
  Filled 2022-02-11 – 2022-02-12 (×2): qty 30, 30d supply, fill #2

## 2021-12-10 NOTE — Progress Notes (Signed)
Patient ID: WILENE PHARO, female   DOB: 06-21-1966, 56 y.o.   MRN: [id number]  [facility] Follow up visit  HAMSINI VERRILLI [id number] 56 y.o.  12/10/2021 3:15 PM  Virtual Visit via Video Note  I connected with Gargi D Bechard on 12/10/21 at  3:00 PM EST by a video enabled telemedicine application and verified that I am speaking with the correct person using two identifiers.  Location: Patient: home Provider:office   I discussed the limitations of evaluation and management by telemedicine and the availability of in person appointments. The patient expressed understanding and agreed to proceed.     I discussed the assessment and treatment plan with the patient. The patient was provided an opportunity to ask questions and all were answered. The patient agreed with the plan and demonstrated an understanding of the instructions.   The patient was advised to call back or seek an in-person evaluation if the symptoms worsen or if the condition fails to improve as anticipated.  I provided 15 minutes of non-face-to-face time during this encounter.   Chief Complaint:  Depression follow up       HPI: Doing fair, gets fatigue , withdrawn, going thru menopause and has appointment with another OB Uses cpap  Works as Artist Recovered from covid Some fatigue and tiredness Has gone out for a date recently, that helped  Modifying factor: mom, son Aggravating factor : faitgue, lonliness No side effects.   Duration  7 years plus    Past Psychiatric History/Hospitalization(s) Manged for depression and mood symptoms for more then 20 years.  Prior Suicide Attempts: No  Medical History; Past Medical History:  Diagnosis Date   Anxiety    Arthritis    back and hips    Depression    Diabetes mellitus type II, controlled ([place]) 08/15/2014   GERD (gastroesophageal reflux disease)    HPV (human papilloma virus) infection    Hyperlipidemia    Lateral meniscus tear     left knee   Menopausal state    PONV (postoperative nausea and vomiting)    slow to wake up    Pre-diabetes    Shortness of breath dyspnea    with exertion    Sleep apnea    cpap -0 setting at 16, uses CPAP nightly   Stress incontinence     Allergies: Allergies  Allergen Reactions   Food Swelling    Big Red Gum makes her tongue swell   Latuda [Lurasidone Hcl] Other (See Comments)    Severe aggitation   Lipitor [Atorvastatin] Other (See Comments)    Muscle aches   Belviq [Lorcaserin Hcl]     Increased appetitie   Contrave [Naltrexone-Bupropion Hcl Er] Other (See Comments)    Sleepy.    Medications: Outpatient Encounter Medications as of 12/10/2021  Medication Sig   ALPRAZolam (XANAX) 0.5 MG tablet TAKE 1 TABLET (0.5 MG TOTAL) BY MOUTH DAILY AS NEEDED FOR ANXIETY   citalopram (CELEXA) 40 MG tablet TAKE 1 TABLET (40 MG TOTAL) BY MOUTH DAILY.   estradiol-norethindrone (ACTIVELLA) 1-0.5 MG tablet Take 1 tablet by mouth daily.   ferrous sulfate 325 (65 FE) MG tablet Take 325 mg by mouth daily with breakfast.   lamoTRIgine (LAMICTAL) 200 MG tablet TAKE 1 TABLET (200 MG TOTAL) BY MOUTH DAILY.   mirabegron ER (MYRBETRIQ) 50 MG TB24 tablet Take 1 tablet (50 mg total) by mouth daily.   Pitavastatin Calcium 1 MG TABS Take 1 tablet (1 mg total) by mouth  daily. (Patient not taking: Reported on 12/02/2021)   pregabalin (LYRICA) 100 MG capsule TAKE 1 CAPSULE (100 MG TOTAL) BY MOUTH 2 (TWO) TIMES DAILY.   Semaglutide, 1 MG/DOSE, (OZEMPIC, 1 MG/DOSE,) 4 MG/3ML SOPN Inject 1 mg into the skin once a week.   Vilazodone HCl (VIIBRYD) 10 MG TABS Take 1 tablet (10 mg total) by mouth daily.   Vitamin D, Ergocalciferol, (DRISDOL) 1.25 MG (50000 UNIT) CAPS capsule TAKE 1 CAPSULE (50,000 UNITS TOTAL) BY MOUTH EVERY 7 (SEVEN) DAYS.   [DISCONTINUED] lamoTRIgine (LAMICTAL) 200 MG tablet TAKE 1 TABLET (200 MG TOTAL) BY MOUTH DAILY.   No facility-administered encounter medications on file as of 12/10/2021.      Family History; Family History  Problem Relation Age of Onset   Hyperlipidemia Mother    Sleep apnea Mother    Depression Mother    Diabetes Father    Hypertension Father    COPD Father    Diabetes Paternal Aunt    Diabetes Paternal Uncle    Alcohol abuse Paternal Uncle    Leukemia Paternal Uncle    Alcohol abuse Paternal Grandfather    Depression Sister       Labs:  Recent Results (from the past 2160 hour(s))  COMPLETE METABOLIC PANEL WITH GFR     Status: None   Collection Time: 09/18/21 12:00 AM  Result Value Ref Range   Glucose, Bld 95 65 - 99 mg/dL    Comment: .            Fasting reference interval .    BUN 18 7 - 25 mg/dL   Creat 0.74 0.50 - 1.03 mg/dL   eGFR 97 > OR = 60 mL/min/1.6m    Comment: The eGFR is based on the CKD-EPI 2021 equation. To calculate  the new eGFR from a previous Creatinine or Cystatin C result, go to https://www.kidney.org/professionals/ kdoqi/gfr%5Fcalculator    BUN/Creatinine Ratio NOT APPLICABLE 6 - 22 (calc)   Sodium 140 135 - 146 mmol/L   Potassium 4.4 3.5 - 5.3 mmol/L   Chloride 105 98 - 110 mmol/L   CO2 27 20 - 32 mmol/L   Calcium 9.4 8.6 - 10.4 mg/dL   Total Protein 7.1 6.1 - 8.1 g/dL   Albumin 4.6 3.6 - 5.1 g/dL   Globulin 2.5 1.9 - 3.7 g/dL (calc)   AG Ratio 1.8 1.0 - 2.5 (calc)   Total Bilirubin 0.4 0.2 - 1.2 mg/dL   Alkaline phosphatase (APISO) 94 37 - 153 U/L   AST 16 10 - 35 U/L   ALT 15 6 - 29 U/L  VITAMIN D 25 Hydroxy (Vit-D Deficiency, Fractures)     Status: None   Collection Time: 09/18/21 12:00 AM  Result Value Ref Range   Vit D, 25-Hydroxy 57 30 - 100 ng/mL    Comment: Vitamin D Status         25-OH Vitamin D: . Deficiency:                    <20 ng/mL Insufficiency:             20 - 29 ng/mL Optimal:                 > or = 30 ng/mL . For 25-OH Vitamin D testing on patients on  D2-supplementation and patients for whom quantitation  of D2 and D3 fractions is required, the [facility](TM) 25-OH  VIT D, (D2,D3), LC/MS/MS is recommended: order  code [id number](patients >234yr. See  Note 1 . Note 1 . For additional information, please refer to  http://education.[facility].com/faq/FAQ199  (This link is being provided for informational/ educational purposes only.)   Fe+TIBC+Fer     Status: Abnormal   Collection Time: 09/18/21 12:00 AM  Result Value Ref Range   Iron 63 45 - 160 mcg/dL   TIBC 482 (H) 250 - 450 mcg/dL (calc)   %SAT 13 (L) 16 - 45 % (calc)   Ferritin 6 (L) 16 - 232 ng/mL  FSH/LH     Status: None   Collection Time: 09/18/21 12:00 AM  Result Value Ref Range   FSH 40.2 mIU/mL    Comment:                     Reference Range .              Follicular Phase       6.6-29.4              Mid-cycle Peak         3.1-17.7              Luteal Phase           1.5- 9.1              Postmenopausal       23.0-116.3              .    LH 26.8 mIU/mL    Comment:     Reference Range Follicular Phase  7.6-54.6 Mid-Cycle Peak    8.7-76.3 Luteal Phase      0.5-16.9 Postmenopausal    10.0-54.7   Estradiol     Status: None   Collection Time: 09/18/21 12:00 AM  Result Value Ref Range   Estradiol 34 pg/mL    Comment:       Reference Range         Follicular Phase:    50-354         Mid-Cycle:           64-357         Luteal Phase:        56-214         Postmenopausal:      < or = 31 . Reference range established on post-pubertal patient population. No pre-pubertal reference range established using this assay. For any patients for whom low Estradiol levels are anticipated (e.g. males, pre-pubertal children and hypogonadal/post-menopausal  females), the [employer] Estradiol, Ultrasensitive, LCMSMS assay is recommended (order code [phone]). . Please note: patients being treated with the drug  fulvestrant (Faslodex(R)) have demonstrated significant  interference in immunoassay methods for estradiol  measurement. The cross reactivity could lead to  falsely  elevated estradiol test results leading to an  inappropriate clinical assessment of estrogen status. [facility] order code 30289-Estradiol,  Ultrasensitive LC/MS/MS demonstrates negligible cross  re activity with fulvestrant.   RPR     Status: None   Collection Time: 09/18/21 12:00 AM  Result Value Ref Range   RPR Ser Ql NON-REACTIVE NON-REACTIVE  Vitamin B12     Status: None   Collection Time: 09/18/21 12:00 AM  Result Value Ref Range   Vitamin B-12 412 200 - 1,100 pg/mL  Sedimentation rate     Status: None   Collection Time: 09/18/21 12:00 AM  Result Value Ref Range   Sed Rate 14 0 - 30 mm/h  CBC     Status: Abnormal   Collection  Time: 09/18/21 12:00 AM  Result Value Ref Range   WBC 8.9 3.8 - 10.8 Thousand/uL   RBC 4.84 3.80 - 5.10 Million/uL   Hemoglobin 13.3 11.7 - 15.5 g/dL   HCT 40.6 35.0 - 45.0 %   MCV 83.9 80.0 - 100.0 fL   MCH 27.5 27.0 - 33.0 pg   MCHC 32.8 32.0 - 36.0 g/dL   RDW 14.2 11.0 - 15.0 %   Platelets 568 (H) 140 - 400 Thousand/uL   MPV 9.0 7.5 - 12.5 fL  TSH     Status: None   Collection Time: 09/18/21 12:00 AM  Result Value Ref Range   TSH 1.64 mIU/L    Comment:           Reference Range .           > or = 20 Years  0.40-4.50 .                Pregnancy Ranges           First trimester    0.26-2.66           Second trimester   0.55-2.73           Third trimester    0.43-2.91   Folate     Status: None   Collection Time: 09/18/21 12:00 AM  Result Value Ref Range   Folate 14.2 ng/mL    Comment:                            Reference Range                            Low:           <3.4                            Borderline:    3.4-5.4                            Normal:        >5.4 .   POCT glycosylated hemoglobin (Hb A1C)     Status: Abnormal   Collection Time: 09/18/21  1:57 PM  Result Value Ref Range   Hemoglobin A1C 5.9 (A) 4.0 - 5.6 %   HbA1c POC (<> result, manual entry)     HbA1c, POC (prediabetic range)     HbA1c, POC  (controlled diabetic range)    WET PREP FOR TRICH, YEAST, CLUE     Status: None   Collection Time: 09/18/21  2:25 PM   Specimen: Vaginal; Sterile Swab  Result Value Ref Range   MICRO NUMBER: [id number]    Specimen Quality Adequate    SOURCE: VAGINAL    Status FINAL    RESULT      No Trichomonas vaginalis seen. No yeast seen No clue cells seen Epithelial Cells Present  Urinalysis, Routine w reflex microscopic     Status: Abnormal   Collection Time: 09/18/21  2:26 PM  Result Value Ref Range   Color, Urine YELLOW YELLOW   APPearance CLEAR CLEAR   Specific Gravity, Urine 1.023 1.001 - 1.035   pH 6.0 5.0 - 8.0   Glucose, UA NEGATIVE NEGATIVE   Bilirubin Urine NEGATIVE NEGATIVE   Ketones, ur TRACE (A) NEGATIVE   Hgb urine dipstick NEGATIVE NEGATIVE  Protein, ur NEGATIVE NEGATIVE   Nitrite NEGATIVE NEGATIVE   Leukocytes,Ua 1+ (A) NEGATIVE   WBC, UA 0-5 0 - 5 /HPF   RBC / HPF NONE SEEN 0 - 2 /HPF   Squamous Epithelial / LPF 0-5 < OR = 5 /HPF   Bacteria, UA NONE SEEN NONE SEEN /HPF   Hyaline Cast NONE SEEN NONE SEEN /LPF  Urine Culture     Status: Abnormal   Collection Time: 09/18/21  2:26 PM   Specimen: Urine  Result Value Ref Range   MICRO NUMBER: [id number]    SPECIMEN QUALITY: Adequate    Sample Source NOT GIVEN    STATUS: FINAL    ISOLATE 1: Streptococcus agalactiae (A)     Comment: 10,000-49,000 CFU/mL of Group B Streptococcus isolated Beta-hemolytic streptococci are predictably susceptible to Penicillin and other beta-lactams. Susceptibility testing not routinely performed. Please contact the laboratory within 3 days if  susceptibility testing is desired. Erythromycin and clindamycin are not recommended for treatment of urinary tract infections, but clindamycin may be useful for treatment of rectovaginal colonization or infection. Any amount of group B Streptococcus in  urine specimens obtained from pregnant females is a marker of genital tract colonization. If this patient is  pregnant, please refer to ACOG guidelines for appropriate screening and management of pregnant women.   POCT URINALYSIS DIP (CLINITEK)     Status: Abnormal   Collection Time: 09/18/21  4:06 PM  Result Value Ref Range   Color, UA yellow yellow   Clarity, UA clear clear   Glucose, UA negative negative mg/dL   Bilirubin, UA negative negative   Ketones, POC UA negative negative mg/dL   Spec Grav, UA 1.025 1.010 - 1.025   Blood, UA negative negative   pH, UA 6.0 5.0 - 8.0   POC PROTEIN,UA negative negative, trace   Urobilinogen, UA 0.2 0.2 or 1.0 E.U./dL   Nitrite, UA Negative Negative   Leukocytes, UA Trace (A) Negative  Cervicovaginal ancillary only( [place])     Status: None   Collection Time: 10/03/21 10:17 AM  Result Value Ref Range   Bacterial Vaginitis (gardnerella) Negative    Candida Vaginitis Negative    Candida Glabrata Negative    Comment      Normal Reference Range Bacterial Vaginosis - Negative   Comment Normal Reference Range Candida Species - Negative    Comment Normal Reference Range Candida Galbrata - Negative   Urinalysis, Routine w reflex microscopic     Status: Abnormal   Collection Time: 11/12/21 12:00 AM  Result Value Ref Range   Color, Urine YELLOW YELLOW   APPearance CLEAR CLEAR   Specific Gravity, Urine 1.025 1.001 - 1.035   pH 6.0 5.0 - 8.0   Glucose, UA NEGATIVE NEGATIVE   Bilirubin Urine NEGATIVE NEGATIVE   Ketones, ur NEGATIVE NEGATIVE   Hgb urine dipstick NEGATIVE NEGATIVE   Protein, ur NEGATIVE NEGATIVE   Nitrite NEGATIVE NEGATIVE   Leukocytes,Ua 2+ (A) NEGATIVE   WBC, UA 20-40 (A) 0 - 5 /HPF   RBC / HPF NONE SEEN 0 - 2 /HPF   Squamous Epithelial / LPF 6-10 (A) < OR = 5 /HPF   Bacteria, UA MODERATE (A) NONE SEEN /HPF   Hyaline Cast 0-5 (A) NONE SEEN /LPF   Yeast FEW (A) NONE SEEN /HPF  Urine Culture     Status: None   Collection Time: 11/12/21 12:00 AM   Specimen: Urine  Result Value Ref Range   MICRO NUMBER: [id number]  SPECIMEN  QUALITY: Adequate    Sample Source URINE    STATUS: FINAL    Result: No Growth   MICROSCOPIC MESSAGE     Status: None   Collection Time: 11/12/21 12:00 AM  Result Value Ref Range   Note      Comment: This urine was analyzed for the presence of WBC,  RBC, bacteria, casts, and other formed elements.  Only those elements seen were reported. . .          Mental Status Examination;   Psychiatric Specialty Exam: Physical Exam  Review of Systems  Constitutional:  Positive for malaise/fatigue.  Cardiovascular:  Negative for chest pain.  Psychiatric/Behavioral:  Negative for hallucinations.    Last menstrual period 12/17/2016.There is no height or weight on file to calculate BMI.  General Appearance:casual  Eye Contact::  fair  Speech:  Normal Rate  Volume:  Normal  Mood : fair  Affect:  congruent  Thought Process:  Coherent  Orientation:  Full (Time, Place, and Person)  Thought Content:  Rumination  Suicidal Thoughts:  No  Homicidal Thoughts:  No  Memory:  Immediate;   Fair Recent;   Fair  Judgement:  Fair  Insight:  Shallow  Psychomotor Activity:  Normal  Concentration:  Fair  Recall:  Fair  Akathisia:  Negative  Handed:  Right  AIMS (if indicated):     Assets:  Communication Skills Desire for Improvement Financial Resources/Insurance Housing  Sleep:        Assessment: Axis I: mood disorder unspecified. Rule out mood disorder secondary to general medical condition. Major depressive disorder recurrent moderate. Rule out panic disorder. Generalized anxiety disorder   Axis III:  Past Medical History:  Diagnosis Date   Anxiety    Arthritis    back and hips    Depression    Diabetes mellitus type II, controlled ([place]) 08/15/2014   GERD (gastroesophageal reflux disease)    HPV (human papilloma virus) infection    Hyperlipidemia    Lateral meniscus tear    left knee   Menopausal state    PONV (postoperative nausea and vomiting)    slow to wake up     Pre-diabetes    Shortness of breath dyspnea    with exertion    Sleep apnea    cpap -0 setting at 16, uses CPAP nightly   Stress incontinence     Axis IV: psychosocial   Treatment Plan and Summary:  Prior documentation reviewed   Depression: fair, continue celexa, lamictal  Anxiety : reasonable but worries related to menopause, continu celexa, and prn xanax  Panic attacks sporadic, continue celexa Fu 60mDiscussed fatigue and plans to use cpap regular, add activities and fu with pcp if needed   , NMerian Capron MD 12/10/2021

## 2021-12-11 ENCOUNTER — Ambulatory Visit: Payer: 59 | Admitting: Orthopaedic Surgery

## 2021-12-11 ENCOUNTER — Encounter: Payer: Self-pay | Admitting: Physician Assistant

## 2021-12-12 ENCOUNTER — Other Ambulatory Visit (HOSPITAL_BASED_OUTPATIENT_CLINIC_OR_DEPARTMENT_OTHER): Payer: Self-pay

## 2021-12-12 ENCOUNTER — Ambulatory Visit: Payer: 59 | Admitting: Obstetrics and Gynecology

## 2021-12-16 ENCOUNTER — Ambulatory Visit: Payer: 59 | Admitting: Orthopaedic Surgery

## 2021-12-16 NOTE — Progress Notes (Signed)
? ?GYNECOLOGY OFFICE VISIT NOTE ? ?History:  ? Shannon [name] is a 56 y.o. G1P1001 here today for vaginal irritation and cramping on the left side. She also notes diarrhea. She has a history of IBS but hasn't had a flare in a long time and doesn't think it is this.  ? ?She had her IUD removed in December 2022 as she was postmenopausal. She had an [place] in January which showed EL 3 mm and otherwise fibroid uterus and normal adnexa.  ? ?She saw Dr. Gala [name] in February and had a bloody mucous discharge and was being evaluated for recurrent UTI with urology. She wanted to wait to follow up until after that. She saw urology and her blood in her urine was felt to be contaminant - straight cath was negative.  At that visit, Dr. Gala [name] discussed possible resection of fibroids vs replacing IUD. She has not had any additional bleeding since that time. Since removal of her IUD she has also started HRT of Activella. She was started on the higher dose by her PCP.  ? ?She does have still some irritation on a spot on her labia. She also has some discomfort with intercourse and has not been sexually active. Her cultures in December were negative and there was nothing apparent on exam when I saw her at that time.  ? ?She denies any abnormal vaginal discharge, bleeding, pelvic pain or other concerns. ? ? ?  ?Past Medical History:  ?Diagnosis Date  ? Anxiety   ? Arthritis   ? back and hips   ? Depression   ? Diabetes mellitus type II, controlled ([place]) 08/15/2014  ? GERD (gastroesophageal reflux disease)   ? HPV (human papilloma virus) infection   ? Hyperlipidemia   ? Lateral meniscus tear   ? left knee  ? Menopausal state   ? PONV (postoperative nausea and vomiting)   ? slow to wake up   ? Pre-diabetes   ? Shortness of breath dyspnea   ? with exertion   ? Sleep apnea   ? cpap -0 setting at 16, uses CPAP nightly  ? Stress incontinence   ? ? ?Past Surgical History:  ?Procedure Laterality Date  ? APPENDECTOMY    ? BREATH TEK H PYLORI N/A  04/03/2015  ? Procedure: BREATH TEK H PYLORI;  Surgeon: Greer Pickerel, MD;  Location: [facility];  Service: General;  Laterality: N/A;  ? CESAREAN SECTION    ? 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/04/99  ? second interspace, right foot  ? 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  ? second interspace, left foot  ? feet surgery    ? KNEE ARTHROSCOPY Right 03/17/2019  ? Procedure: RIGHT KNEE ARTHROSCOPY WITH PARTIAL LATERAL MENISCECTOMY, REMOVAL OF LOOSE BODIES, 3 COMPARTMENT CHONDROPLASTY;  Surgeon: Mcarthur Rossetti, MD;  Location: [facility];  Service: Orthopedics;  Laterality: Right;  ? LAPAROSCOPIC GASTRIC SLEEVE RESECTION WITH HIATAL HERNIA REPAIR N/A 08/13/2015  ? Procedure: LAPAROSCOPIC GASTRIC SLEEVE RESECTION WITH HIATAL HERNIA REPAIR;  Surgeon: Greer Pickerel, MD;  Location: [facility];  Service: General;  Laterality: N/A;  ? laproscopy    ? UPPER GI ENDOSCOPY  08/13/2015  ? Procedure: UPPER GI ENDOSCOPY;  Surgeon: Greer Pickerel, MD;  Location: [facility];  Service: General;;  ? WISDOM TOOTH EXTRACTION    ? ? ?The following portions of the patient's history were reviewed and updated as appropriate: allergies, current medications, past family history, past medical history, past social history, past surgical history and problem list.  ? ?  Health Maintenance:   ?Normal pap and negative HRHPV:  ?Diagnosis  ?Date Value Ref Range Status  ?11/28/2020   Final  ? - Negative for intraepithelial lesion or malignancy (NILM)  ?  ? ?Normal mammogram on 11/2020.  ? ?Review of Systems:  ?Pertinent items noted in HPI and remainder of comprehensive ROS otherwise negative. ? ?Physical Exam:  ?BP 130/74   Pulse 79   Ht 5' 5.5" (1.664 m)   Wt 220 lb (99.8 kg)   LMP 12/17/2016 (Exact Date)   BMI 36.05 kg/m?  ?CONSTITUTIONAL: Well-developed, well-nourished female in no acute distress.  ?HEENT:  Normocephalic, atraumatic. External right and left ear normal. No scleral icterus.  ?NECK: Normal range of motion, supple, no masses  noted on observation ?SKIN: No rash noted. Not diaphoretic. No erythema. No pallor. ?MUSCULOSKELETAL: Normal range of motion. No edema noted. ?NEUROLOGIC: Alert and oriented to person, place, and time. Normal muscle tone coordination. No cranial nerve deficit noted. ?PSYCHIATRIC: Normal mood and affect. Normal behavior. Normal judgment and thought content. ? ?CARDIOVASCULAR: Normal heart rate noted ?RESPIRATORY: Effort and breath sounds normal, no problems with respiration noted ?ABDOMEN: No masses noted. No other overt distention noted.   ? ?PELVIC: Deferred ? ?Labs and Imaging ?No results found for this or any previous visit (from the past 168 hour(s)). ?No results found.  ?Assessment and Plan:  ?[name] was seen today for vaginal pressure and cramping. ? ?Diagnoses and all orders for this visit: ? ?Hot flashes due to menopause ?- We discussed the treatment options for menopause as well as indications - we discussed both HRT and non-HRT.  ?- Discussed the benefits of each and effectiveness.  ?- Discussed goals of therapy i.e. reduction of hot flashes (not complete resolution). Reviewed full response takes up to 2-3 months, including for hormone therapy. ?- Discussed if HRT we do shortest amount of time at lowest dose. We discussed annual attempts at coming of the HRT typically in the fall months when the weather has cooled down. ?- We discussed the differences in modes of therapy for HRT -- patch vs oral therapy.  ?- Discussed risks of HRT: E+P = breast cancer, clotting, MI/Stroke. Discussed risk of E alone. We reviewed that limits of data from the [facility] regarding breast cancer impact - only progesterone used in that study was provera which is biologically active in the best. We MAY reduce that risk by doing a different progesterone based therapy I.e. norethindrone, prometrium. We reviewed the indication and necessity for progesterone and that estrogen alone in those with a uterus have an increased risk of endometrial  hyperplasia/malignancy ?- Discussed genitourinary symptoms i.e. urinary issues, vaginal dryness and dyspareunia and that for these symptoms, local treatment is best. ?- She would like:  to try a lower dose of her current medication.  We also discussed patches and potentially options if GI symptoms continue even with lower dose.  ?-     Estradiol-Norethindrone Acet (ACTIVELLA) 0.5-0.1 MG tablet; Take 1 tablet by mouth daily. ? ?Vaginal irritation ?- She will try vaginal estrogen and if no improvement in symptoms after the 2 weeks of nightly therapy, she will call and come in for vulvar biopsy ? ?Vaginal dryness ?- Vaginal dryness most likely due to being postmenopausal ?- She tried OTC lubricants i.e. silicone or oil based as well as vaginal moisturizer i.e. Replens ?- Reviewed the risks of vaginal estrogen.  ?- She would like to try vag E.  ?-     estradiol (ESTRACE) 0.1 MG/GM  vaginal cream; Apply 1 gram per vagina every night for 2 weeks, then apply three times a week ? ? ? ? ? ?Routine preventative health maintenance measures emphasized. ?Please refer to After Visit Summary for other counseling recommendations.  ? ?No follow-ups on file. ? ?Radene Gunning, MD, FACOG ?Obstetrician Social research officer, government, Faculty Practice ?[facility] for [facility] ? ? ? ? ? ? ?

## 2021-12-18 ENCOUNTER — Ambulatory Visit: Payer: 59 | Admitting: Orthopaedic Surgery

## 2021-12-19 ENCOUNTER — Other Ambulatory Visit (HOSPITAL_BASED_OUTPATIENT_CLINIC_OR_DEPARTMENT_OTHER): Payer: Self-pay

## 2021-12-19 ENCOUNTER — Ambulatory Visit: Payer: 59 | Admitting: Physician Assistant

## 2021-12-19 ENCOUNTER — Other Ambulatory Visit: Payer: Self-pay

## 2021-12-19 ENCOUNTER — Encounter: Payer: Self-pay | Admitting: Obstetrics and Gynecology

## 2021-12-19 ENCOUNTER — Ambulatory Visit (INDEPENDENT_AMBULATORY_CARE_PROVIDER_SITE_OTHER): Payer: 59 | Admitting: Obstetrics and Gynecology

## 2021-12-19 VITALS — BP 130/74 | HR 79 | Ht 65.5 in | Wt 220.0 lb

## 2021-12-19 DIAGNOSIS — N898 Other specified noninflammatory disorders of vagina: Secondary | ICD-10-CM

## 2021-12-19 DIAGNOSIS — N951 Menopausal and female climacteric states: Secondary | ICD-10-CM

## 2021-12-19 MED ORDER — ESTRADIOL 0.1 MG/GM VA CREA
TOPICAL_CREAM | VAGINAL | 12 refills | Status: DC
  Filled 2021-12-19: qty 42.5, 90d supply, fill #0
  Filled 2021-12-31: qty 42.5, 28d supply, fill #0

## 2021-12-19 MED ORDER — ESTRADIOL-NORETHINDRONE ACET 0.5-0.1 MG PO TABS
1.0000 | ORAL_TABLET | Freq: Every day | ORAL | 6 refills | Status: DC
  Filled 2021-12-19: qty 84, 84d supply, fill #0
  Filled 2021-12-31 (×2): qty 28, 28d supply, fill #0

## 2021-12-23 ENCOUNTER — Ambulatory Visit: Payer: 59 | Admitting: Orthopaedic Surgery

## 2021-12-27 ENCOUNTER — Other Ambulatory Visit (HOSPITAL_BASED_OUTPATIENT_CLINIC_OR_DEPARTMENT_OTHER): Payer: Self-pay

## 2021-12-31 ENCOUNTER — Other Ambulatory Visit (HOSPITAL_BASED_OUTPATIENT_CLINIC_OR_DEPARTMENT_OTHER): Payer: Self-pay

## 2021-12-31 ENCOUNTER — Other Ambulatory Visit: Payer: Self-pay | Admitting: *Deleted

## 2021-12-31 MED ORDER — MISOPROSTOL 200 MCG PO TABS
ORAL_TABLET | ORAL | 0 refills | Status: DC
  Filled 2021-12-31: qty 3, 1d supply, fill #0

## 2021-12-31 MED ORDER — LEVONORGESTREL 20 MCG/DAY IU IUD
1.0000 | INTRAUTERINE_SYSTEM | Freq: Once | INTRAUTERINE | 0 refills | Status: DC
  Filled 2021-12-31: qty 1, 1d supply, fill #0

## 2021-12-31 NOTE — Progress Notes (Signed)
Pt called requesting an appt for IUD insertion.  Per Dr Damita Dunnings she may have Cytotec to take the night prior to Mirena insertion.  Pt also request that her IUD be sent to [facility] as her insurance would pay for it that way. ?

## 2022-01-01 ENCOUNTER — Other Ambulatory Visit (HOSPITAL_BASED_OUTPATIENT_CLINIC_OR_DEPARTMENT_OTHER): Payer: Self-pay

## 2022-01-02 ENCOUNTER — Other Ambulatory Visit (HOSPITAL_BASED_OUTPATIENT_CLINIC_OR_DEPARTMENT_OTHER): Payer: Self-pay

## 2022-01-02 ENCOUNTER — Encounter: Payer: Self-pay | Admitting: Neurology

## 2022-01-06 ENCOUNTER — Other Ambulatory Visit (HOSPITAL_BASED_OUTPATIENT_CLINIC_OR_DEPARTMENT_OTHER): Payer: Self-pay

## 2022-01-06 ENCOUNTER — Encounter: Payer: Self-pay | Admitting: Obstetrics & Gynecology

## 2022-01-06 ENCOUNTER — Other Ambulatory Visit: Payer: Self-pay

## 2022-01-06 ENCOUNTER — Telehealth (HOSPITAL_COMMUNITY): Payer: Self-pay

## 2022-01-06 ENCOUNTER — Ambulatory Visit (INDEPENDENT_AMBULATORY_CARE_PROVIDER_SITE_OTHER): Payer: 59 | Admitting: Obstetrics & Gynecology

## 2022-01-06 VITALS — BP 118/82 | HR 80 | Ht 65.5 in | Wt 222.0 lb

## 2022-01-06 DIAGNOSIS — Z30430 Encounter for insertion of intrauterine contraceptive device: Secondary | ICD-10-CM | POA: Diagnosis not present

## 2022-01-06 LAB — POCT URINE PREGNANCY: Preg Test, Ur: NEGATIVE

## 2022-01-06 MED ORDER — LEVONORGESTREL 20 MCG/DAY IU IUD
1.0000 | INTRAUTERINE_SYSTEM | Freq: Once | INTRAUTERINE | Status: AC
  Administered 2022-01-06: 1 via INTRAUTERINE

## 2022-01-06 MED ORDER — ALPRAZOLAM 0.5 MG PO TABS
0.5000 mg | ORAL_TABLET | Freq: Every day | ORAL | 0 refills | Status: DC | PRN
  Filled 2022-01-06: qty 30, 30d supply, fill #0

## 2022-01-06 NOTE — Telephone Encounter (Signed)
Patient called to get a refill on Xanax sent to [facility] pharmacy ?

## 2022-01-06 NOTE — Progress Notes (Signed)
? ? ?  GYNECOLOGY OFFICE PROCEDURE NOTE ? ?Shannon [name] is a 56 y.o. G1P1001 here for Mirena IUD insertion for AUB. No GYN concerns.  Last pap smear was on 11/28/2020 and was normal. Of note, patient brought it her own Mirena, and premedicated with misoprostol. ? ?IUD Insertion Procedure Note ?Patient identified, informed consent performed, consent signed.   Discussed risks of irregular bleeding, cramping, infection, malpositioning or misplacement of the IUD outside the uterus which may require further procedure such as laparoscopy. Time out was performed.   ? ?Speculum placed in the vagina.  Cervix visualized.  Cleaned with Betadine x 2.  Grasped anteriorly with a single tooth tenaculum.  Uterus was initially unable to be sounded due to cervical stenosis; cervical dilators were used to breach the stenosis.  It was sounded to 10 cm.  Mirena IUD placed per manufacturer's recommendations.  Strings trimmed to 3 cm. Tenaculum was removed, good hemostasis noted.  Patient tolerated procedure well.   ? ?Of note, patient had a post insertion transvaginal pelvic ultrasound in office which showed IUD in correct fundal position.  ? ?Patient was given post-procedure instructions.  Patient was also asked to check IUD strings periodically and follow up in 4 weeks for IUD check. ? ? ?Verita Schneiders, MD, FACOG ?Obstetrician Social research officer, government, Faculty Practice ?[facility] ?

## 2022-01-07 ENCOUNTER — Encounter: Payer: Self-pay | Admitting: Physician Assistant

## 2022-01-08 ENCOUNTER — Other Ambulatory Visit (HOSPITAL_BASED_OUTPATIENT_CLINIC_OR_DEPARTMENT_OTHER): Payer: Self-pay

## 2022-01-08 MED ORDER — PREMPRO 0.3-1.5 MG PO TABS
1.0000 | ORAL_TABLET | Freq: Every day | ORAL | 2 refills | Status: DC
  Filled 2022-01-08: qty 28, 28d supply, fill #0
  Filled 2022-02-11: qty 28, 28d supply, fill #1

## 2022-01-09 ENCOUNTER — Other Ambulatory Visit (HOSPITAL_COMMUNITY): Payer: Self-pay | Admitting: Psychiatry

## 2022-01-10 ENCOUNTER — Other Ambulatory Visit (HOSPITAL_BASED_OUTPATIENT_CLINIC_OR_DEPARTMENT_OTHER): Payer: Self-pay

## 2022-01-10 MED ORDER — VILAZODONE HCL 10 MG PO TABS
10.0000 mg | ORAL_TABLET | Freq: Every day | ORAL | 1 refills | Status: DC
  Filled 2022-01-10: qty 30, 30d supply, fill #0
  Filled 2022-02-11: qty 30, 30d supply, fill #1

## 2022-01-13 ENCOUNTER — Ambulatory Visit: Payer: 59 | Admitting: Obstetrics and Gynecology

## 2022-01-27 DIAGNOSIS — G4733 Obstructive sleep apnea (adult) (pediatric): Secondary | ICD-10-CM | POA: Diagnosis not present

## 2022-01-29 ENCOUNTER — Other Ambulatory Visit (HOSPITAL_BASED_OUTPATIENT_CLINIC_OR_DEPARTMENT_OTHER): Payer: Self-pay

## 2022-01-30 ENCOUNTER — Other Ambulatory Visit (HOSPITAL_BASED_OUTPATIENT_CLINIC_OR_DEPARTMENT_OTHER): Payer: Self-pay

## 2022-01-31 ENCOUNTER — Other Ambulatory Visit (HOSPITAL_BASED_OUTPATIENT_CLINIC_OR_DEPARTMENT_OTHER): Payer: Self-pay

## 2022-02-03 ENCOUNTER — Other Ambulatory Visit (HOSPITAL_BASED_OUTPATIENT_CLINIC_OR_DEPARTMENT_OTHER): Payer: Self-pay

## 2022-02-04 ENCOUNTER — Other Ambulatory Visit (HOSPITAL_BASED_OUTPATIENT_CLINIC_OR_DEPARTMENT_OTHER): Payer: Self-pay

## 2022-02-05 ENCOUNTER — Other Ambulatory Visit (HOSPITAL_BASED_OUTPATIENT_CLINIC_OR_DEPARTMENT_OTHER): Payer: Self-pay

## 2022-02-05 ENCOUNTER — Telehealth: Payer: Self-pay

## 2022-02-05 NOTE — Telephone Encounter (Addendum)
Initiated Prior authorization [id number] (1 MG/DOSE) '4MG'$ /3ML pen-injectors ?Via: Covermymeds ?Case/Key:[id number] ?Status: approved  as of 02/05/22 ?Reason: ?Notified Pt via: Mychart ?

## 2022-02-06 ENCOUNTER — Encounter: Payer: Self-pay | Admitting: Obstetrics & Gynecology

## 2022-02-06 ENCOUNTER — Ambulatory Visit (INDEPENDENT_AMBULATORY_CARE_PROVIDER_SITE_OTHER): Payer: 59 | Admitting: Obstetrics & Gynecology

## 2022-02-06 ENCOUNTER — Other Ambulatory Visit (HOSPITAL_BASED_OUTPATIENT_CLINIC_OR_DEPARTMENT_OTHER): Payer: Self-pay

## 2022-02-06 VITALS — BP 145/87 | HR 83 | Resp 16 | Ht 65.5 in | Wt 218.0 lb

## 2022-02-06 DIAGNOSIS — Z30431 Encounter for routine checking of intrauterine contraceptive device: Secondary | ICD-10-CM

## 2022-02-06 NOTE — Progress Notes (Signed)
? ? ?  GYNECOLOGY OFFICE ENCOUNTER NOTE ? ?History:  ?56 y.o. G1P1001 here today for today for IUD string check; Mirena  IUD was placed  01/06/2022 for AUB. No complaints about the IUD, no concerning side effects. ? ?The following portions of the patient's history were reviewed and updated as appropriate: allergies, current medications, past family history, past medical history, past social history, past surgical history and problem list. Last pap smear on 11/28/2020 was normal, negative HRHPV.  Normal mammogram in 11/28/2020. ? ?Review of Systems:  ?Pertinent items are noted in HPI. ?  ?Objective:  ?Physical Exam ?Blood pressure (!) 145/87, pulse 83, resp. rate 16, height 5' 5.5" (1.664 m), weight 218 lb (98.9 kg), last menstrual period 12/17/2016. ?CONSTITUTIONAL: Well-developed, well-nourished female in no acute distress.  ?NEUROLOGIC: Alert and oriented to person, place, and time. Normal reflexes, muscle tone coordination.  ?PSYCHIATRIC: Normal mood and affect. Normal behavior. Normal judgment and thought content. ?CARDIOVASCULAR: Normal heart rate noted ?RESPIRATORY: Effort and breath sounds normal, no problems with respiration noted ?ABDOMEN: Soft, no distention noted.   ?PELVIC: Normal appearing external genitalia; normal appearing vaginal mucosa and cervix.  IUD strings visualized, about 2 cm in length outside cervix. Done in the presence of a chaperone.  ? ?Assessment & Plan:  ?Patient to keep IUD in place for up to eight years; can come in for removal earlier if she desires or for any concerning side effects.  ? ? ?Verita Schneiders, MD, FACOG ?Obstetrician Social research officer, government, Faculty Practice ?[facility] for [place] ? ?

## 2022-02-07 ENCOUNTER — Encounter: Payer: Self-pay | Admitting: Physician Assistant

## 2022-02-07 DIAGNOSIS — R3915 Urgency of urination: Secondary | ICD-10-CM

## 2022-02-11 ENCOUNTER — Other Ambulatory Visit (HOSPITAL_COMMUNITY): Payer: Self-pay | Admitting: Psychiatry

## 2022-02-11 ENCOUNTER — Other Ambulatory Visit: Payer: Self-pay | Admitting: Sports Medicine

## 2022-02-11 ENCOUNTER — Other Ambulatory Visit: Payer: Self-pay | Admitting: Physician Assistant

## 2022-02-11 DIAGNOSIS — M7918 Myalgia, other site: Secondary | ICD-10-CM

## 2022-02-11 DIAGNOSIS — E559 Vitamin D deficiency, unspecified: Secondary | ICD-10-CM

## 2022-02-12 ENCOUNTER — Other Ambulatory Visit (HOSPITAL_BASED_OUTPATIENT_CLINIC_OR_DEPARTMENT_OTHER): Payer: Self-pay

## 2022-02-12 DIAGNOSIS — R3915 Urgency of urination: Secondary | ICD-10-CM | POA: Diagnosis not present

## 2022-02-12 MED ORDER — PREGABALIN 100 MG PO CAPS
ORAL_CAPSULE | Freq: Two times a day (BID) | ORAL | 1 refills | Status: DC
  Filled 2022-02-12: qty 180, 90d supply, fill #0
  Filled 2022-05-08: qty 180, 90d supply, fill #1

## 2022-02-12 MED ORDER — VITAMIN D (ERGOCALCIFEROL) 1.25 MG (50000 UNIT) PO CAPS
ORAL_CAPSULE | ORAL | 1 refills | Status: DC
  Filled 2022-02-12: qty 12, 84d supply, fill #0
  Filled 2022-05-08: qty 12, 84d supply, fill #1

## 2022-02-12 MED ORDER — ALPRAZOLAM 0.5 MG PO TABS
0.5000 mg | ORAL_TABLET | Freq: Every day | ORAL | 0 refills | Status: DC | PRN
  Filled 2022-02-12: qty 30, 30d supply, fill #0

## 2022-02-13 ENCOUNTER — Other Ambulatory Visit (HOSPITAL_BASED_OUTPATIENT_CLINIC_OR_DEPARTMENT_OTHER): Payer: Self-pay

## 2022-02-13 LAB — URINE CULTURE
MICRO NUMBER:: 13315626
SPECIMEN QUALITY:: ADEQUATE

## 2022-02-13 LAB — URINALYSIS, ROUTINE W REFLEX MICROSCOPIC
Bilirubin Urine: NEGATIVE
Nitrite: NEGATIVE
Specific Gravity, Urine: 1.024 (ref 1.001–1.035)
pH: 6 (ref 5.0–8.0)

## 2022-02-13 LAB — MICROSCOPIC MESSAGE

## 2022-02-14 ENCOUNTER — Other Ambulatory Visit: Payer: Self-pay | Admitting: Neurology

## 2022-02-14 DIAGNOSIS — R3915 Urgency of urination: Secondary | ICD-10-CM

## 2022-02-14 NOTE — Progress Notes (Signed)
No significant bacteria collection. Have you consider pelvic floor physical therapy? What about urologist referral?

## 2022-02-17 ENCOUNTER — Encounter: Payer: Self-pay | Admitting: Physician Assistant

## 2022-02-18 ENCOUNTER — Other Ambulatory Visit (HOSPITAL_BASED_OUTPATIENT_CLINIC_OR_DEPARTMENT_OTHER): Payer: Self-pay

## 2022-02-18 ENCOUNTER — Encounter: Payer: Self-pay | Admitting: Physician Assistant

## 2022-02-18 MED ORDER — DICYCLOMINE HCL 10 MG PO CAPS
10.0000 mg | ORAL_CAPSULE | Freq: Two times a day (BID) | ORAL | 0 refills | Status: DC
  Filled 2022-02-18: qty 60, 30d supply, fill #0

## 2022-02-21 ENCOUNTER — Other Ambulatory Visit: Payer: Self-pay | Admitting: Obstetrics and Gynecology

## 2022-02-21 DIAGNOSIS — Z139 Encounter for screening, unspecified: Secondary | ICD-10-CM

## 2022-02-23 ENCOUNTER — Encounter: Payer: Self-pay | Admitting: Obstetrics and Gynecology

## 2022-02-24 ENCOUNTER — Other Ambulatory Visit: Payer: Self-pay | Admitting: *Deleted

## 2022-02-24 DIAGNOSIS — N939 Abnormal uterine and vaginal bleeding, unspecified: Secondary | ICD-10-CM

## 2022-02-25 ENCOUNTER — Ambulatory Visit (INDEPENDENT_AMBULATORY_CARE_PROVIDER_SITE_OTHER): Payer: 59

## 2022-02-25 DIAGNOSIS — N852 Hypertrophy of uterus: Secondary | ICD-10-CM | POA: Diagnosis not present

## 2022-02-25 DIAGNOSIS — N939 Abnormal uterine and vaginal bleeding, unspecified: Secondary | ICD-10-CM

## 2022-02-25 DIAGNOSIS — D259 Leiomyoma of uterus, unspecified: Secondary | ICD-10-CM | POA: Diagnosis not present

## 2022-02-25 DIAGNOSIS — Z30431 Encounter for routine checking of intrauterine contraceptive device: Secondary | ICD-10-CM

## 2022-02-25 DIAGNOSIS — N83201 Unspecified ovarian cyst, right side: Secondary | ICD-10-CM | POA: Diagnosis not present

## 2022-02-27 ENCOUNTER — Encounter: Payer: Self-pay | Admitting: Obstetrics and Gynecology

## 2022-03-03 ENCOUNTER — Encounter: Payer: Self-pay | Admitting: Physician Assistant

## 2022-03-10 ENCOUNTER — Encounter: Payer: Self-pay | Admitting: Physician Assistant

## 2022-03-11 ENCOUNTER — Other Ambulatory Visit: Payer: Self-pay | Admitting: Physician Assistant

## 2022-03-11 ENCOUNTER — Other Ambulatory Visit (HOSPITAL_BASED_OUTPATIENT_CLINIC_OR_DEPARTMENT_OTHER): Payer: Self-pay

## 2022-03-11 ENCOUNTER — Other Ambulatory Visit (HOSPITAL_COMMUNITY): Payer: Self-pay | Admitting: Psychiatry

## 2022-03-11 DIAGNOSIS — N3281 Overactive bladder: Secondary | ICD-10-CM

## 2022-03-11 DIAGNOSIS — F331 Major depressive disorder, recurrent, moderate: Secondary | ICD-10-CM

## 2022-03-11 MED ORDER — ALPRAZOLAM 0.5 MG PO TABS
0.5000 mg | ORAL_TABLET | Freq: Every day | ORAL | 0 refills | Status: DC | PRN
  Filled 2022-03-11 – 2022-03-27 (×2): qty 30, 30d supply, fill #0

## 2022-03-11 MED ORDER — CITALOPRAM HYDROBROMIDE 40 MG PO TABS
ORAL_TABLET | Freq: Every day | ORAL | 2 refills | Status: DC
  Filled 2022-03-11: qty 30, 30d supply, fill #0
  Filled 2022-04-10: qty 30, 30d supply, fill #1
  Filled 2022-05-08: qty 30, 30d supply, fill #2

## 2022-03-11 MED ORDER — MIRABEGRON ER 50 MG PO TB24
50.0000 mg | ORAL_TABLET | Freq: Every day | ORAL | 0 refills | Status: DC
  Filled 2022-03-11: qty 30, 30d supply, fill #0
  Filled 2022-04-10: qty 30, 30d supply, fill #1
  Filled 2022-05-08: qty 30, 30d supply, fill #2

## 2022-03-11 MED ORDER — LAMOTRIGINE 200 MG PO TABS
ORAL_TABLET | Freq: Every day | ORAL | 2 refills | Status: DC
  Filled 2022-03-11: qty 30, 30d supply, fill #0
  Filled 2022-04-10 – 2022-04-11 (×2): qty 30, 30d supply, fill #1
  Filled 2022-05-08: qty 30, 30d supply, fill #2

## 2022-03-11 MED ORDER — VILAZODONE HCL 10 MG PO TABS
10.0000 mg | ORAL_TABLET | Freq: Every day | ORAL | 1 refills | Status: DC
  Filled 2022-03-11: qty 30, 30d supply, fill #0
  Filled 2022-04-10: qty 30, 30d supply, fill #1

## 2022-03-11 NOTE — Progress Notes (Unsigned)
      GYNECOLOGY OFFICE PROCEDURE NOTE   Shannon [name] is a 56 y.o. G1P1001 here for endometrial biopsy for {Blank single:19197::"AUB","PMB"} and IUD removal for malposition.  Recent ultrasound also showed ***.  Today, she reports no concerning symptoms. Of note, pap on *** was ***, negative HPV.    IUD Removal  Patient identified, informed consent performed, consent signed.  Patient was in the dorsal lithotomy position, normal external genitalia was noted.  A speculum was placed in the patient's vagina, normal discharge was noted, no lesions. The cervix was visualized, no lesions, no abnormal discharge.  The strings of the IUD were grasped and pulled using ring forceps. {Blank single:19197::"The IUD was removed in its entirety. ","The strings of the IUD were not visualized, so Kelly forceps were introduced into the endometrial cavity and the IUD was grasped and removed in its entirety. ","The IUD was unable to be removed"}  Patient tolerated the procedure well.    Patient {Blank single:19197::"will use *** for contraception.","plans for pregnancy soon and she was told to avoid teratogens, take PNV and folic acid."}  ENDOMETRIAL BIOPSY     The indications for endometrial biopsy were reviewed.   Risks of the biopsy including cramping, bleeding, infection, uterine perforation, inadequate specimen and need for additional procedures were discussed. Offered alternative of hysteroscopy, dilation and curettage in OR. The patient states she understands the R/B/I/A and agrees to undergo procedure today. Urine pregnancy test was {Blank single:19197::"Negative","Not indicated"}. Consent was signed. Time out was performed.    Patient was positioned in dorsal lithotomy position. A vaginal speculum was placed.  The cervix was visualized and was prepped with Betadine.  A single-toothed tenaculum was placed on the anterior lip of the cervix to stabilize it. The 3 mm pipelle was easily introduced into the endometrial  cavity without difficulty to a depth of *** cm, and a {Blank single:19197::"Scant","Moderate"} amount of tissue was obtained after two passes and sent to pathology. The instruments were removed from the patient's vagina. Minimal bleeding from the cervix was noted. The patient tolerated the procedure well.   Patient was given post procedure instructions.  Will follow up pathology and manage accordingly; patient will be contacted with results and recommendations.  Routine preventative health maintenance measures emphasized.   Radene Gunning, MD, [place] for [facility], [place]

## 2022-03-13 ENCOUNTER — Ambulatory Visit: Payer: 59 | Admitting: Obstetrics and Gynecology

## 2022-03-13 ENCOUNTER — Other Ambulatory Visit (HOSPITAL_COMMUNITY)
Admission: RE | Admit: 2022-03-13 | Discharge: 2022-03-13 | Disposition: A | Discharge status: Home / Self Care | Payer: 59 | Source: Ambulatory Visit | Attending: Obstetrics and Gynecology | Admitting: Obstetrics and Gynecology

## 2022-03-13 ENCOUNTER — Encounter: Payer: Self-pay | Admitting: Obstetrics and Gynecology

## 2022-03-13 ENCOUNTER — Other Ambulatory Visit (HOSPITAL_BASED_OUTPATIENT_CLINIC_OR_DEPARTMENT_OTHER): Payer: Self-pay

## 2022-03-13 VITALS — BP 145/79 | HR 78 | Ht 65.5 in | Wt 220.0 lb

## 2022-03-13 DIAGNOSIS — N898 Other specified noninflammatory disorders of vagina: Secondary | ICD-10-CM

## 2022-03-13 DIAGNOSIS — N939 Abnormal uterine and vaginal bleeding, unspecified: Secondary | ICD-10-CM

## 2022-03-13 DIAGNOSIS — Z01812 Encounter for preprocedural laboratory examination: Secondary | ICD-10-CM

## 2022-03-13 DIAGNOSIS — R9389 Abnormal findings on diagnostic imaging of other specified body structures: Secondary | ICD-10-CM | POA: Diagnosis not present

## 2022-03-13 DIAGNOSIS — Z30432 Encounter for removal of intrauterine contraceptive device: Secondary | ICD-10-CM

## 2022-03-13 DIAGNOSIS — R232 Flushing: Secondary | ICD-10-CM

## 2022-03-13 DIAGNOSIS — N83201 Unspecified ovarian cyst, right side: Secondary | ICD-10-CM

## 2022-03-13 LAB — POCT URINE PREGNANCY: Preg Test, Ur: NEGATIVE

## 2022-03-13 MED ORDER — ESTRADIOL 0.0375 MG/24HR TD PTWK
0.0375 mg | MEDICATED_PATCH | TRANSDERMAL | 12 refills | Status: DC
  Filled 2022-03-13: qty 4, 28d supply, fill #0
  Filled 2022-04-08: qty 4, 28d supply, fill #1
  Filled 2022-05-01: qty 4, 28d supply, fill #2

## 2022-03-13 MED ORDER — PROGESTERONE MICRONIZED 100 MG PO CAPS
100.0000 mg | ORAL_CAPSULE | Freq: Every day | ORAL | 12 refills | Status: DC
  Filled 2022-03-13: qty 30, 30d supply, fill #0
  Filled 2022-04-10: qty 30, 30d supply, fill #1
  Filled 2022-05-08: qty 30, 30d supply, fill #2

## 2022-03-13 MED ORDER — METRONIDAZOLE 500 MG PO TABS
500.0000 mg | ORAL_TABLET | Freq: Two times a day (BID) | ORAL | 0 refills | Status: DC
  Filled 2022-03-13: qty 14, 7d supply, fill #0

## 2022-03-14 ENCOUNTER — Encounter: Payer: Self-pay | Admitting: Obstetrics and Gynecology

## 2022-03-14 ENCOUNTER — Other Ambulatory Visit (HOSPITAL_BASED_OUTPATIENT_CLINIC_OR_DEPARTMENT_OTHER): Payer: Self-pay

## 2022-03-14 LAB — CERVICOVAGINAL ANCILLARY ONLY
Bacterial Vaginitis (gardnerella): NEGATIVE
Candida Glabrata: NEGATIVE
Candida Vaginitis: NEGATIVE
Comment: NEGATIVE
Comment: NEGATIVE
Comment: NEGATIVE

## 2022-03-14 LAB — SURGICAL PATHOLOGY

## 2022-03-19 ENCOUNTER — Telehealth (HOSPITAL_COMMUNITY): Payer: 59 | Admitting: Psychiatry

## 2022-03-20 ENCOUNTER — Ambulatory Visit (INDEPENDENT_AMBULATORY_CARE_PROVIDER_SITE_OTHER): Payer: 59

## 2022-03-20 DIAGNOSIS — Z1231 Encounter for screening mammogram for malignant neoplasm of breast: Secondary | ICD-10-CM

## 2022-03-20 DIAGNOSIS — Z139 Encounter for screening, unspecified: Secondary | ICD-10-CM

## 2022-03-21 NOTE — Progress Notes (Signed)
Normal mammogram. Follow up in 1 year.

## 2022-03-26 ENCOUNTER — Telehealth (HOSPITAL_COMMUNITY): Payer: 59 | Admitting: Psychiatry

## 2022-03-27 ENCOUNTER — Other Ambulatory Visit (HOSPITAL_BASED_OUTPATIENT_CLINIC_OR_DEPARTMENT_OTHER): Payer: Self-pay

## 2022-04-07 ENCOUNTER — Ambulatory Visit: Payer: 59 | Admitting: Physician Assistant

## 2022-04-07 ENCOUNTER — Encounter: Payer: Self-pay | Admitting: Physician Assistant

## 2022-04-07 VITALS — Ht 65.5 in | Wt 224.8 lb

## 2022-04-07 DIAGNOSIS — M1711 Unilateral primary osteoarthritis, right knee: Secondary | ICD-10-CM | POA: Diagnosis not present

## 2022-04-07 MED ORDER — SODIUM HYALURONATE (VISCOSUP) 20 MG/2ML IX SOSY
20.0000 mg | PREFILLED_SYRINGE | INTRA_ARTICULAR | Status: AC | PRN
  Administered 2022-04-07: 20 mg via INTRA_ARTICULAR

## 2022-04-07 NOTE — Progress Notes (Signed)
Office Visit Note   Patient: Shannon [name]           Date of Birth: October 27, 1965           MRN: [id number] Visit Date: 04/07/2022              Requested by: [name], PA-C [address] [place],  [place] [address] PCP: [name], PA-C   Assessment & Plan: Visit Diagnoses:  1. Primary osteoarthritis of right knee     Plan: She will follow-up with [place] in 1 week for second Euflexxa injection.  Patient tolerated injection well.  Follow-Up Instructions: Return in about 1 week (around 04/14/2022) for Supplemental injection.   Orders:  Orders Placed This Encounter  Procedures   Large Joint Inj   No orders of the defined types were placed in this encounter.     Procedures: Large Joint Inj: R knee on 04/07/2022 9:37 AM Indications: pain Details: 22 G 1.5 in needle, anterolateral approach  Arthrogram: No  Medications: 20 mg Sodium Hyaluronate 20 MG/2ML Outcome: tolerated well, no immediate complications Procedure, treatment alternatives, risks and benefits explained, specific risks discussed. Consent was given by the patient. Immediately prior to procedure a time out was called to verify the correct patient, procedure, equipment, support staff and site/side marked as required. Patient was prepped and draped in the usual sterile fashion.       Clinical Data: No additional findings.   Subjective: Chief Complaint  Patient presents with   Right Knee - Pain    HPI Shannon [name] comes in today with acute on chronic right knee pain.  She states knee overall is doing well.  However it started hurting this past Sunday.  She had difficulty walking for 3 days.  The pain is somewhat better after icing rest and the use of Tylenol and ibuprofen.  She has also been wearing a knee brace.  She has known right knee osteoarthritis and had been approved for Euflexxa injection. Review of Systems See HPI otherwise negative  Objective: Vital Signs: Ht 5' 5.5" (1.664 m)   Wt  224 lb 12.8 oz (102 kg)   LMP 12/17/2016 (Exact Date)   BMI 36.84 kg/m   Physical Exam Constitutional:      Appearance: She is not ill-appearing or diaphoretic.  Pulmonary:     Effort: Pulmonary effort is normal.  Neurological:     Mental Status: She is oriented to person, place, and time.  Psychiatric:        Mood and Affect: Mood normal.     Ortho Exam Right knee full flexion and extension.  No gross instability valgus varus stressing.  Nontender along the medial lateral joint line.  No effusion, abnormal warmth or erythema.  Patellofemoral crepitus with passive range of motion.  Specialty Comments:  No specialty comments available.  Imaging: No results found.   PMFS History: Patient Active Problem List   Diagnosis Date Noted   Menopausal symptoms 10/15/2021   Hematuria 06/18/2021   Unilateral primary osteoarthritis, right knee 02/06/2021   Vitamin D deficiency 11/27/2020   Digital mucinous cyst of right thumb 10/23/2020   Family history of leukemia 08/14/2020   SOB (shortness of breath) on exertion 08/14/2020   Bilateral lower extremity edema 08/14/2020   Elevated platelet count 08/14/2020   Fatigue 08/13/2020   OAB (overactive bladder) 11/23/2019   Loss of balance 05/24/2019   Vertigo 05/24/2019   Primary osteoarthritis of right knee 10/06/2018   BPPV (  benign paroxysmal positional vertigo), right 05/09/2018   Fibroids 10/30/2016   Cervical myofascial pain syndrome 07/31/2016   Hiatal hernia 08/13/2015   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   Generalized anxiety disorder 03/16/2015   DDD (degenerative disc disease), lumbar 08/28/2014   Hypothyroidism 08/15/2014   Type II diabetes mellitus with nephropathy ([place]) 08/15/2014   OSA on CPAP 03/31/2014   Class 2 severe obesity due to excess calories with serious comorbidity and body mass index (BMI) of 37.0 to 37.9 in adult [facility]) 03/31/2014   Sleep apnea 02/05/2012   Mood disorder ([place]) 02/05/2012    Hyperlipidemia 11/22/2008   Past Medical History:  Diagnosis Date   Anxiety    Arthritis    back and hips    Depression    Diabetes mellitus type II, controlled ([place]) 08/15/2014   GERD (gastroesophageal reflux disease)    HPV (human papilloma virus) infection    Hyperlipidemia    Lateral meniscus tear    left knee   Menopausal state    PONV (postoperative nausea and vomiting)    slow to wake up    Pre-diabetes    Shortness of breath dyspnea    with exertion    Sleep apnea    cpap -0 setting at 16, uses CPAP nightly   Stress incontinence     Family History  Problem Relation Age of Onset   Hyperlipidemia Mother    Sleep apnea Mother    Depression Mother    Diabetes Father    Hypertension Father    COPD Father    Diabetes Paternal Aunt    Diabetes Paternal Uncle    Alcohol abuse Paternal Uncle    Leukemia Paternal Uncle    Alcohol abuse Paternal Grandfather    Depression Sister     Past Surgical History:  Procedure Laterality Date   APPENDECTOMY     BREATH TEK H PYLORI N/A 04/03/2015   Procedure: BREATH TEK H PYLORI;  Surgeon: Greer Pickerel, MD;  Location: [facility];  Service: General;  Laterality: N/A;   CESAREAN SECTION     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/04/99   second interspace, right foot   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   second interspace, left foot   feet surgery     KNEE ARTHROSCOPY Right 03/17/2019   Procedure: RIGHT KNEE ARTHROSCOPY WITH PARTIAL LATERAL MENISCECTOMY, REMOVAL OF LOOSE BODIES, 3 COMPARTMENT CHONDROPLASTY;  Surgeon: Mcarthur Rossetti, MD;  Location: [facility];  Service: Orthopedics;  Laterality: Right;   LAPAROSCOPIC GASTRIC SLEEVE RESECTION WITH HIATAL HERNIA REPAIR N/A 08/13/2015   Procedure: LAPAROSCOPIC GASTRIC SLEEVE RESECTION WITH HIATAL HERNIA REPAIR;  Surgeon: Greer Pickerel, MD;  Location: [facility];  Service: General;  Laterality: N/A;   laproscopy     UPPER GI ENDOSCOPY  08/13/2015   Procedure: UPPER GI  ENDOSCOPY;  Surgeon: Greer Pickerel, MD;  Location: [facility];  Service: General;;   WISDOM TOOTH EXTRACTION     Social History   Occupational History   Not on file  Tobacco Use   Smoking status: Never   Smokeless tobacco: Never  Vaping Use   Vaping Use: Never used  Substance and Sexual Activity   Alcohol use: No    Alcohol/week: 0.0 standard drinks of alcohol   Drug use: No   Sexual activity: Yes    Birth control/protection: I.U.D.    Comment: IUD inserted 12/12/16

## 2022-04-08 ENCOUNTER — Other Ambulatory Visit (HOSPITAL_BASED_OUTPATIENT_CLINIC_OR_DEPARTMENT_OTHER): Payer: Self-pay

## 2022-04-10 ENCOUNTER — Ambulatory Visit: Payer: 59 | Admitting: Physician Assistant

## 2022-04-10 ENCOUNTER — Other Ambulatory Visit (HOSPITAL_BASED_OUTPATIENT_CLINIC_OR_DEPARTMENT_OTHER): Payer: Self-pay

## 2022-04-11 ENCOUNTER — Other Ambulatory Visit (HOSPITAL_BASED_OUTPATIENT_CLINIC_OR_DEPARTMENT_OTHER): Payer: Self-pay

## 2022-04-14 ENCOUNTER — Ambulatory Visit: Payer: 59 | Admitting: Physician Assistant

## 2022-04-14 ENCOUNTER — Encounter: Payer: Self-pay | Admitting: Physician Assistant

## 2022-04-14 ENCOUNTER — Other Ambulatory Visit (HOSPITAL_BASED_OUTPATIENT_CLINIC_OR_DEPARTMENT_OTHER): Payer: Self-pay

## 2022-04-14 DIAGNOSIS — M1711 Unilateral primary osteoarthritis, right knee: Secondary | ICD-10-CM | POA: Diagnosis not present

## 2022-04-14 MED ORDER — SODIUM HYALURONATE (VISCOSUP) 20 MG/2ML IX SOSY
20.0000 mg | PREFILLED_SYRINGE | INTRA_ARTICULAR | Status: AC | PRN
  Administered 2022-04-14: 20 mg via INTRA_ARTICULAR

## 2022-04-14 NOTE — Progress Notes (Signed)
   Procedure Note  Patient: Shannon [name]             Date of Birth: [birth year]           MRN: [id number]             Visit Date: 04/14/2022  HPI: Shannon [name] returns today for second Euflexxa injection.  She states she had no adverse effects to the first injection and actually feels that the pain is somewhat better.  Physical exam: Right knee good range of motion ambulates without any assistive device.  There is no abnormal warmth erythema or effusion right knee.  Procedures: Visit Diagnoses:  1. Primary osteoarthritis of right knee     Large Joint Inj: R knee on 04/14/2022 9:29 AM Indications: pain Details: 22 G 1.5 in needle, superolateral approach  Arthrogram: No  Medications: 20 mg Sodium Hyaluronate (Viscosup) 20 MG/2ML Outcome: tolerated well, no immediate complications Procedure, treatment alternatives, risks and benefits explained, specific risks discussed. Consent was given by the patient. Immediately prior to procedure a time out was called to verify the correct patient, procedure, equipment, support staff and site/side marked as required. Patient was prepped and draped in the usual sterile fashion.      Plan: She will follow-up with [place] in 1 week for her third Euflexxa injection.  She tolerated the injection well today.

## 2022-04-16 ENCOUNTER — Encounter (HOSPITAL_COMMUNITY): Payer: Self-pay | Admitting: Psychiatry

## 2022-04-16 ENCOUNTER — Telehealth (INDEPENDENT_AMBULATORY_CARE_PROVIDER_SITE_OTHER): Payer: 59 | Admitting: Psychiatry

## 2022-04-16 DIAGNOSIS — F411 Generalized anxiety disorder: Secondary | ICD-10-CM

## 2022-04-16 DIAGNOSIS — F331 Major depressive disorder, recurrent, moderate: Secondary | ICD-10-CM | POA: Diagnosis not present

## 2022-04-16 DIAGNOSIS — F410 Panic disorder [episodic paroxysmal anxiety] without agoraphobia: Secondary | ICD-10-CM

## 2022-04-16 NOTE — Progress Notes (Signed)
Patient ID: Shannon [name], female   DOB: Nov 02, 1965, 56 y.o.   MRN: [id number]  [facility] Follow up visit  Shannon [name] [id number] 56 y.o.  04/16/2022 2:42 PM  Virtual Visit via Video Note  I connected with Shannon [name] on 04/16/22 at  2:30 PM EDT by a video enabled telemedicine application and verified that I am speaking with the correct person using two identifiers.  Location: Patient: home Provider: home office   I discussed the limitations of evaluation and management by telemedicine and the availability of in person appointments. The patient expressed understanding and agreed to proceed.     I discussed the assessment and treatment plan with the patient. The patient was provided an opportunity to ask questions and all were answered. The patient agreed with the plan and demonstrated an understanding of the instructions.   The patient was advised to call back or seek an in-person evaluation if the symptoms worsen or if the condition fails to improve as anticipated.  I provided 15 minutes of non-face-to-face time during this encounter.   Chief Complaint:  Depression follow up       HPI: Doing fair, it is her dad's death anniversary 3 years. So she spent time with mom Family support helps Job stress is manageable Gets tired but uses cpap at night Works as Artist Recovered from covid  Modifying factor: mom, son Aggravating factor : faitgue, lonliness   Duration  7 -8 years Severity manageable   Past Psychiatric History/Hospitalization(s) Manged for depression and mood symptoms for more then 20 years.  Prior Suicide Attempts: No  Medical History; Past Medical History:  Diagnosis Date   Anxiety    Arthritis    back and hips    Depression    Diabetes mellitus type II, controlled ([place]) 08/15/2014   GERD (gastroesophageal reflux disease)    HPV (human papilloma virus) infection    Hyperlipidemia    Lateral meniscus tear    left knee    Menopausal state    PONV (postoperative nausea and vomiting)    slow to wake up    Pre-diabetes    Shortness of breath dyspnea    with exertion    Sleep apnea    cpap -0 setting at 16, uses CPAP nightly   Stress incontinence     Allergies: Allergies  Allergen Reactions   Food Swelling    Big Red Gum makes her tongue swell   Latuda [Lurasidone Hcl] Other (See Comments)    Severe aggitation   Lipitor [Atorvastatin] Other (See Comments)    Muscle aches   Atorvastatin Calcium    Belviq [Lorcaserin Hcl]     Increased appetitie   Contrave [Naltrexone-Bupropion Hcl Er] Other (See Comments)    Sleepy.    Medications: Outpatient Encounter Medications as of 04/16/2022  Medication Sig   ALPRAZolam (XANAX) 0.5 MG tablet Take 1 tablet (0.5 mg total) by mouth daily as needed for anxiety.   citalopram (CELEXA) 40 MG tablet TAKE 1 TABLET (40 MG TOTAL) BY MOUTH DAILY.   dicyclomine (BENTYL) 10 MG capsule Take 1 capsule (10 mg total) by mouth 2 (two) times daily before a meal.   estradiol (CLIMARA) 0.0375 mg/24hr patch Place 1 patch (0.0375 mg total) onto the skin once a week.   estradiol (ESTRACE) 0.1 MG/GM vaginal cream Apply 1 gram per vagina every night for 2 weeks, then apply three times a week   ferrous sulfate 325 (65 FE) MG tablet Take  325 mg by mouth daily with breakfast.   lamoTRIgine (LAMICTAL) 200 MG tablet TAKE 1 TABLET (200 MG TOTAL) BY MOUTH DAILY.   metroNIDAZOLE (FLAGYL) 500 MG tablet Take 1 tablet (500 mg total) by mouth 2 (two) times daily.   mirabegron ER (MYRBETRIQ) 50 MG TB24 tablet Take 1 tablet (50 mg total) by mouth daily.   pregabalin (LYRICA) 100 MG capsule TAKE 1 CAPSULE (100 MG TOTAL) BY MOUTH 2 (TWO) TIMES DAILY.   progesterone (PROMETRIUM) 100 MG capsule Take 1 capsule (100 mg total) by mouth daily.   Semaglutide, 1 MG/DOSE, (OZEMPIC, 1 MG/DOSE,) 4 MG/3ML SOPN Inject 1 mg into the skin once a week.   Vilazodone HCl (VIIBRYD) 10 MG TABS Take 1 tablet (10 mg total)  by mouth daily.   Vitamin D, Ergocalciferol, (DRISDOL) 1.25 MG (50000 UNIT) CAPS capsule TAKE 1 CAPSULE (50,000 UNITS TOTAL) BY MOUTH EVERY 7 (SEVEN) DAYS.   No facility-administered encounter medications on file as of 04/16/2022.     Family History; Family History  Problem Relation Age of Onset   Hyperlipidemia Mother    Sleep apnea Mother    Depression Mother    Diabetes Father    Hypertension Father    COPD Father    Diabetes Paternal Aunt    Diabetes Paternal Uncle    Alcohol abuse Paternal Uncle    Leukemia Paternal Uncle    Alcohol abuse Paternal Grandfather    Depression Sister       Labs:  Recent Results (from the past 2160 hour(s))  Urinalysis, Routine w reflex microscopic     Status: Abnormal   Collection Time: 02/12/22 12:00 AM  Result Value Ref Range   Color, Urine YELLOW YELLOW   APPearance CLOUDY (A) CLEAR   Specific Gravity, Urine 1.024 1.001 - 1.035   pH 6.0 5.0 - 8.0   Glucose, UA 2+ (A) NEGATIVE   Bilirubin Urine NEGATIVE NEGATIVE   Ketones, ur TRACE (A) NEGATIVE   Hgb urine dipstick TRACE (A) NEGATIVE   Protein, ur TRACE (A) NEGATIVE   Nitrite NEGATIVE NEGATIVE   Leukocytes,Ua 2+ (A) NEGATIVE  Urine Culture     Status: None   Collection Time: 02/12/22 12:00 AM   Specimen: Urine  Result Value Ref Range   MICRO NUMBER: [id number]    SPECIMEN QUALITY: Adequate    Sample Source URINE    STATUS: FINAL    ISOLATE 1:      Less than 10,000 CFU/mL of single Gram positive organism isolated. No further testing will be performed. If clinically indicated, recollection using a method to minimize contamination, with prompt transfer to Urine Culture Transport Tube, is recommended.  MICROSCOPIC MESSAGE     Status: None   Collection Time: 02/12/22 12:00 AM  Result Value Ref Range   Note      Comment: This urine was analyzed for the presence of WBC,  RBC, bacteria, casts, and other formed elements.  Only those elements seen were reported. . .   Surgical  pathology( [facility])     Status: None   Collection Time: 03/13/22  8:54 AM  Result Value Ref Range   SURGICAL PATHOLOGY      SURGICAL PATHOLOGY CASE: [id number] PATIENT: Shannon [name] Surgical Pathology Report     Clinical History: AUB (cm)     FINAL MICROSCOPIC DIAGNOSIS:  A. ENDOMETRIUM, BIOPSY: Minute strips of benign endometrial surface epithelium. Additional fragments normal endocervical tissue and squamous epithelium. Negative for endometrial hyperplasia and malignancy. Please see comment.  Comment: The submitted specimen does not contain adequate endometrial tissue for evaluation. The revaluation may be considered if indicated clinically.   GROSS DESCRIPTION:  A. Received in formalin labeled with the patients name and DOB is a 2.3 x 2.1 x 0.3 cm aggregate of red-brown soft tissue fragments, submitted in toto in a single cassette(s).  (LEF 03/14/2022)   Final Diagnosis performed by Unknown Jim, MD.   Electronically signed 03/14/2022 Technical and / or Professional components performed at [facility]. [facility], [place] [address] [place], [place] [address].  Immu nohistochemistry Technical component (if applicable) was performed at [facility]. [address], [place], [place], [place] [address].   IMMUNOHISTOCHEMISTRY DISCLAIMER (if applicable): Some of these immunohistochemical stains may have been developed and the performance characteristics determine by [facility]. Some may not have been cleared or approved by the U.S. Food and Drug Administration. The FDA has determined that such clearance or approval is not necessary. This test is used for clinical purposes. It should not be regarded as investigational or for research. This laboratory is certified under the [place] (CLIA-88) as qualified to perform high complexity clinical laboratory testing.  The controls  stained appropriately.   POCT urine pregnancy     Status: None   Collection Time: 03/13/22  8:59 AM  Result Value Ref Range   Preg Test, Ur Negative Negative  Cervicovaginal ancillary only( [place])     Status: None   Collection Time: 03/13/22  9:12 AM  Result Value Ref Range   Bacterial Vaginitis (gardnerella) Negative    Candida Vaginitis Negative    Candida Glabrata Negative    Comment Normal Reference Range Candida Species - Negative    Comment Normal Reference Range Candida Galbrata - Negative    Comment      Normal Reference Range Bacterial Vaginosis - Negative         Mental Status Examination;   Psychiatric Specialty Exam: Physical Exam  Review of Systems  Cardiovascular:  Negative for chest pain.  Neurological:  Negative for tremors.  Psychiatric/Behavioral:  Negative for hallucinations.     Last menstrual period 12/17/2016.There is no height or weight on file to calculate BMI.  General Appearance:casual  Eye Contact::  fair  Speech:  Normal Rate  Volume:  Normal  Mood : fair  Affect:  congruent  Thought Process:  Coherent  Orientation:  Full (Time, Place, and Person)  Thought Content:  Rumination  Suicidal Thoughts:  No  Homicidal Thoughts:  No  Memory:  Immediate;   Fair Recent;   Fair  Judgement:  Fair  Insight:  Shallow  Psychomotor Activity:  Normal  Concentration:  Fair  Recall:  Fair  Akathisia:  Negative  Handed:  Right  AIMS (if indicated):     Assets:  Communication Skills Desire for Improvement Financial Resources/Insurance Housing  Sleep:        Assessment: Axis I: mood disorder unspecified. Rule out mood disorder secondary to general medical condition. Major depressive disorder recurrent moderate. Rule out panic disorder. Generalized anxiety disorder   Axis III:  Past Medical History:  Diagnosis Date   Anxiety    Arthritis    back and hips    Depression    Diabetes mellitus type II, controlled ([place]) 08/15/2014   GERD  (gastroesophageal reflux disease)    HPV (human papilloma virus) infection    Hyperlipidemia    Lateral meniscus tear    left knee  Menopausal state    PONV (postoperative nausea and vomiting)    slow to wake up    Pre-diabetes    Shortness of breath dyspnea    with exertion    Sleep apnea    cpap -0 setting at 16, uses CPAP nightly   Stress incontinence     Axis IV: psychosocial   Treatment Plan and Summary:  Prior documentation reviewed   Depression: fair continue celexa, lamictal. Anxiety :manageble on xanax prn, celexa  Panic attacks sporadic, not regular, continue xanax At times loud noises bother her, discussed to distract or avoid , take xanax if needed, review with Pcp if med side effect Fu 67m  , Tami Blass, MD 04/16/2022

## 2022-04-21 ENCOUNTER — Encounter: Payer: Self-pay | Admitting: Physician Assistant

## 2022-04-21 ENCOUNTER — Ambulatory Visit: Payer: 59 | Admitting: Physician Assistant

## 2022-04-21 DIAGNOSIS — G4733 Obstructive sleep apnea (adult) (pediatric): Secondary | ICD-10-CM | POA: Diagnosis not present

## 2022-04-21 DIAGNOSIS — M1711 Unilateral primary osteoarthritis, right knee: Secondary | ICD-10-CM

## 2022-04-21 MED ORDER — SODIUM HYALURONATE (VISCOSUP) 20 MG/2ML IX SOSY
20.0000 mg | PREFILLED_SYRINGE | INTRA_ARTICULAR | Status: AC | PRN
  Administered 2022-04-21: 20 mg via INTRA_ARTICULAR

## 2022-04-21 NOTE — Progress Notes (Signed)
   Procedure Note  Patient: Shannon [name]             Date of Birth: [birth year]           MRN: [id number]             Visit Date: 04/21/2022 HPI: [name] returns for her third Euflexxa injection.  She has had no real relief with the injections thus far.  She has had no significant adverse effects.  Physical exam: Right knee good range of motion.  No abnormal warmth erythema or effusion. Procedures: Visit Diagnoses:  1. Primary osteoarthritis of right knee     Large Joint Inj on 04/21/2022 11:53 AM Indications: pain Details: 22 G 1.5 in needle, anterolateral approach  Arthrogram: No  Medications: 20 mg Sodium Hyaluronate (Viscosup) 20 MG/2ML Outcome: tolerated well, no immediate complications Procedure, treatment alternatives, risks and benefits explained, specific risks discussed. Consent was given by the patient. Immediately prior to procedure a time out was called to verify the correct patient, procedure, equipment, support staff and site/side marked as required. Patient was prepped and draped in the usual sterile fashion.     Plan: She will follow-up with [place] as needed.  She knows to wait at least 6 months between supplemental injections.  Questions were encouraged and answered at length.

## 2022-04-30 ENCOUNTER — Other Ambulatory Visit (HOSPITAL_BASED_OUTPATIENT_CLINIC_OR_DEPARTMENT_OTHER): Payer: Self-pay

## 2022-04-30 ENCOUNTER — Telehealth (HOSPITAL_COMMUNITY): Payer: Self-pay

## 2022-04-30 MED ORDER — ALPRAZOLAM 0.5 MG PO TABS
0.5000 mg | ORAL_TABLET | Freq: Every day | ORAL | 0 refills | Status: DC | PRN
  Filled 2022-04-30: qty 30, 30d supply, fill #0

## 2022-04-30 NOTE — Telephone Encounter (Signed)
Patient needs a refill on Xanax sent to Shannon [name] Last refill 05/23 Next appt 10/04

## 2022-05-01 ENCOUNTER — Other Ambulatory Visit (HOSPITAL_BASED_OUTPATIENT_CLINIC_OR_DEPARTMENT_OTHER): Payer: Self-pay

## 2022-05-08 ENCOUNTER — Other Ambulatory Visit (HOSPITAL_COMMUNITY): Payer: Self-pay | Admitting: Psychiatry

## 2022-05-08 ENCOUNTER — Other Ambulatory Visit: Payer: Self-pay | Admitting: Physician Assistant

## 2022-05-09 ENCOUNTER — Other Ambulatory Visit (HOSPITAL_BASED_OUTPATIENT_CLINIC_OR_DEPARTMENT_OTHER): Payer: Self-pay

## 2022-05-09 ENCOUNTER — Encounter: Payer: Self-pay | Admitting: Physician Assistant

## 2022-05-09 MED ORDER — DICYCLOMINE HCL 10 MG PO CAPS
10.0000 mg | ORAL_CAPSULE | Freq: Two times a day (BID) | ORAL | 0 refills | Status: DC
  Filled 2022-05-09: qty 60, 30d supply, fill #0

## 2022-05-09 MED ORDER — VILAZODONE HCL 10 MG PO TABS
10.0000 mg | ORAL_TABLET | Freq: Every day | ORAL | 1 refills | Status: DC
  Filled 2022-05-09: qty 30, 30d supply, fill #0
  Filled 2022-06-17: qty 30, 30d supply, fill #1

## 2022-05-11 ENCOUNTER — Encounter: Payer: Self-pay | Admitting: Physician Assistant

## 2022-05-11 ENCOUNTER — Encounter: Payer: Self-pay | Admitting: Obstetrics and Gynecology

## 2022-05-29 ENCOUNTER — Ambulatory Visit (INDEPENDENT_AMBULATORY_CARE_PROVIDER_SITE_OTHER): Payer: 59

## 2022-05-29 ENCOUNTER — Other Ambulatory Visit: Payer: 59

## 2022-05-29 DIAGNOSIS — D251 Intramural leiomyoma of uterus: Secondary | ICD-10-CM | POA: Diagnosis not present

## 2022-05-29 DIAGNOSIS — R9389 Abnormal findings on diagnostic imaging of other specified body structures: Secondary | ICD-10-CM | POA: Diagnosis not present

## 2022-05-29 DIAGNOSIS — N83201 Unspecified ovarian cyst, right side: Secondary | ICD-10-CM

## 2022-05-29 DIAGNOSIS — D252 Subserosal leiomyoma of uterus: Secondary | ICD-10-CM | POA: Diagnosis not present

## 2022-05-29 DIAGNOSIS — N83291 Other ovarian cyst, right side: Secondary | ICD-10-CM | POA: Diagnosis not present

## 2022-06-04 ENCOUNTER — Other Ambulatory Visit (HOSPITAL_COMMUNITY): Payer: Self-pay | Admitting: Psychiatry

## 2022-06-04 ENCOUNTER — Other Ambulatory Visit (HOSPITAL_BASED_OUTPATIENT_CLINIC_OR_DEPARTMENT_OTHER): Payer: Self-pay

## 2022-06-04 ENCOUNTER — Ambulatory Visit (INDEPENDENT_AMBULATORY_CARE_PROVIDER_SITE_OTHER): Payer: 59 | Admitting: Physician Assistant

## 2022-06-04 ENCOUNTER — Encounter: Payer: Self-pay | Admitting: Physician Assistant

## 2022-06-04 VITALS — BP 130/68 | HR 84 | Ht 65.5 in | Wt 223.0 lb

## 2022-06-04 DIAGNOSIS — E559 Vitamin D deficiency, unspecified: Secondary | ICD-10-CM | POA: Diagnosis not present

## 2022-06-04 DIAGNOSIS — N3281 Overactive bladder: Secondary | ICD-10-CM | POA: Diagnosis not present

## 2022-06-04 DIAGNOSIS — K591 Functional diarrhea: Secondary | ICD-10-CM | POA: Diagnosis not present

## 2022-06-04 DIAGNOSIS — Z0000 Encounter for general adult medical examination without abnormal findings: Secondary | ICD-10-CM | POA: Diagnosis not present

## 2022-06-04 DIAGNOSIS — E039 Hypothyroidism, unspecified: Secondary | ICD-10-CM

## 2022-06-04 DIAGNOSIS — E1121 Type 2 diabetes mellitus with diabetic nephropathy: Secondary | ICD-10-CM

## 2022-06-04 DIAGNOSIS — E785 Hyperlipidemia, unspecified: Secondary | ICD-10-CM

## 2022-06-04 MED ORDER — ALPRAZOLAM 0.5 MG PO TABS
0.5000 mg | ORAL_TABLET | Freq: Every day | ORAL | 0 refills | Status: DC | PRN
  Filled 2022-06-04: qty 30, 30d supply, fill #0

## 2022-06-04 MED ORDER — MIRABEGRON ER 50 MG PO TB24
50.0000 mg | ORAL_TABLET | Freq: Every day | ORAL | 3 refills | Status: DC
  Filled 2022-06-04: qty 30, 30d supply, fill #0
  Filled 2022-06-17 – 2022-07-08 (×2): qty 30, 30d supply, fill #1
  Filled 2022-08-05: qty 30, 30d supply, fill #2
  Filled 2022-09-03: qty 30, 30d supply, fill #3
  Filled 2022-10-01: qty 30, 30d supply, fill #4
  Filled 2022-11-02 – 2022-11-12 (×2): qty 30, 30d supply, fill #5
  Filled 2022-11-20 – 2022-12-18 (×2): qty 30, 30d supply, fill #6
  Filled 2023-01-15: qty 30, 30d supply, fill #7
  Filled 2023-02-16: qty 30, 30d supply, fill #8
  Filled 2023-03-18 (×2): qty 30, 30d supply, fill #9
  Filled 2023-04-20: qty 30, 30d supply, fill #10
  Filled 2023-05-25: qty 30, 30d supply, fill #11

## 2022-06-04 MED ORDER — OZEMPIC (2 MG/DOSE) 8 MG/3ML ~~LOC~~ SOPN
2.0000 mg | PEN_INJECTOR | SUBCUTANEOUS | 0 refills | Status: DC
  Filled 2022-06-04: qty 3, 28d supply, fill #0
  Filled 2022-06-27: qty 3, 28d supply, fill #1
  Filled 2022-08-02: qty 3, 28d supply, fill #2

## 2022-06-04 NOTE — Patient Instructions (Signed)
Increased ozempic '2mg'$  weekly.   Health Maintenance, Female Adopting a healthy lifestyle and getting preventive care are important in promoting health and wellness. Ask your health care provider about: The right schedule for you to have regular tests and exams. Things you can do on your own to prevent diseases and keep yourself healthy. What should I know about diet, weight, and exercise? Eat a healthy diet  Eat a diet that includes plenty of vegetables, fruits, low-fat dairy products, and lean protein. Do not eat a lot of foods that are high in solid fats, added sugars, or sodium. Maintain a healthy weight Body mass index (BMI) is used to identify weight problems. It estimates body fat based on height and weight. Your health care provider can help determine your BMI and help you achieve or maintain a healthy weight. Get regular exercise Get regular exercise. This is one of the most important things you can do for your health. Most adults should: Exercise for at least 150 minutes each week. The exercise should increase your heart rate and make you sweat (moderate-intensity exercise). Do strengthening exercises at least twice a week. This is in addition to the moderate-intensity exercise. Spend less time sitting. Even light physical activity can be beneficial. Watch cholesterol and blood lipids Have your blood tested for lipids and cholesterol at 54 years of age, then have this test every 5 years. Have your cholesterol levels checked more often if: Your lipid or cholesterol levels are high. You are older than 54 years of age. You are at high risk for heart disease. What should I know about cancer screening? Depending on your health history and family history, you may need to have cancer screening at various ages. This may include screening for: Breast cancer. Cervical cancer. Colorectal cancer. Skin cancer. Lung cancer. What should I know about heart disease, diabetes, and high blood  pressure? Blood pressure and heart disease High blood pressure causes heart disease and increases the risk of stroke. This is more likely to develop in people who have high blood pressure readings or are overweight. Have your blood pressure checked: Every 3-5 years if you are 49-15 years of age. Every year if you are 72 years old or older. Diabetes Have regular diabetes screenings. This checks your fasting blood sugar level. Have the screening done: Once every three years after age 50 if you are at a normal weight and have a low risk for diabetes. More often and at a younger age if you are overweight or have a high risk for diabetes. What should I know about preventing infection? Hepatitis B If you have a higher risk for hepatitis B, you should be screened for this virus. Talk with your health care provider to find out if you are at risk for hepatitis B infection. Hepatitis C Testing is recommended for: Everyone born from [birth year] through 1963. Anyone with known risk factors for hepatitis C. Sexually transmitted infections (STIs) Get screened for STIs, including gonorrhea and chlamydia, if: You are sexually active and are younger than 53 years of age. You are older than 54 years of age and your health care provider tells you that you are at risk for this type of infection. Your sexual activity has changed since you were last screened, and you are at increased risk for chlamydia or gonorrhea. Ask your health care provider if you are at risk. Ask your health care provider about whether you are at high risk for HIV. Your health care provider may recommend a  prescription medicine to help prevent HIV infection. If you choose to take medicine to prevent HIV, you should first get tested for HIV. You should then be tested every 3 months for as long as you are taking the medicine. Pregnancy If you are about to stop having your period (premenopausal) and you may become pregnant, seek counseling before you  get pregnant. Take 400 to 800 micrograms (mcg) of folic acid every day if you become pregnant. Ask for birth control (contraception) if you want to prevent pregnancy. Osteoporosis and menopause Osteoporosis is a disease in which the bones lose minerals and strength with aging. This can result in bone fractures. If you are 36 years old or older, or if you are at risk for osteoporosis and fractures, ask your health care provider if you should: Be screened for bone loss. Take a calcium or vitamin D supplement to lower your risk of fractures. Be given hormone replacement therapy (HRT) to treat symptoms of menopause. Follow these instructions at home: Alcohol use Do not drink alcohol if: Your health care provider tells you not to drink. You are pregnant, may be pregnant, or are planning to become pregnant. If you drink alcohol: Limit how much you have to: 0-1 drink a day. Know how much alcohol is in your drink. In the [place] one drink equals one 12 oz bottle of beer (355 mL), one 5 oz glass of wine (148 mL), or one 1 oz glass of hard liquor (44 mL). Lifestyle Do not use any products that contain nicotine or tobacco. These products include cigarettes, chewing tobacco, and vaping devices, such as e-cigarettes. If you need help quitting, ask your health care provider. Do not use street drugs. Do not share needles. Ask your health care provider for help if you need support or information about quitting drugs. General instructions Schedule regular health, dental, and eye exams. Stay current with your vaccines. Tell your health care provider if: You often feel depressed. You have ever been abused or do not feel safe at home. Summary Adopting a healthy lifestyle and getting preventive care are important in promoting health and wellness. Follow your health care provider's instructions about healthy diet, exercising, and getting tested or screened for diseases. Follow your health care provider's  instructions on monitoring your cholesterol and blood pressure. This information is not intended to replace advice given to you by your health care provider. Make sure you discuss any questions you have with your health care provider. Document Revised: 02/25/2021 Document Reviewed: 02/25/2021 Elsevier Patient Education  [place].

## 2022-06-04 NOTE — Progress Notes (Signed)
Complete physical exam  Patient: Shannon [name]   DOB: 1965/10/29   56 y.o. Female  MRN: [id number]  Subjective:    Chief Complaint  Patient presents with   Annual Exam    Shannon [name] is a 56 y.o. female who presents today for a complete physical exam. She reports consuming a general diet. The patient does not participate in regular exercise at present. She generally feels fairly well. She reports sleeping fairly well. She does not have additional problems to discuss today.    Most recent fall risk assessment:    06/04/2022    1:41 PM  [place] in the past year? 0  Number falls in past yr: 0  Injury with Fall? 0  Risk for fall due to : No Fall Risks  Follow up Falls evaluation completed     Most recent depression screenings:    06/04/2022    1:42 PM 09/18/2021    1:58 PM  PHQ 2/9 Scores  PHQ - 2 Score 2 4  PHQ- 9 Score 13 17    Vision:Within last year and Dental: No current dental problems  Patient Active Problem List   Diagnosis Date Noted   Menopausal symptoms 10/15/2021   Hematuria 06/18/2021   Unilateral primary osteoarthritis, right knee 02/06/2021   Vitamin D deficiency 11/27/2020   Digital mucinous cyst of right thumb 10/23/2020   Family history of leukemia 08/14/2020   SOB (shortness of breath) on exertion 08/14/2020   Bilateral lower extremity edema 08/14/2020   Elevated platelet count 08/14/2020   Fatigue 08/13/2020   OAB (overactive bladder) 11/23/2019   Loss of balance 05/24/2019   Vertigo 05/24/2019   Primary osteoarthritis of right knee 10/06/2018   BPPV (benign paroxysmal positional vertigo), right 05/09/2018   Fibroids 10/30/2016   Cervical myofascial pain syndrome 07/31/2016   Hiatal hernia 08/13/2015   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   Generalized anxiety disorder 03/16/2015   DDD (degenerative disc disease), lumbar 08/28/2014   Hypothyroidism 08/15/2014   Type II diabetes mellitus with nephropathy ([place]) 08/15/2014   OSA  on CPAP 03/31/2014   Class 2 severe obesity due to excess calories with serious comorbidity and body mass index (BMI) of 37.0 to 37.9 in adult ([place]) 03/31/2014   Sleep apnea 02/05/2012   Mood disorder ([place]) 02/05/2012   Hyperlipidemia LDL goal <70 11/22/2008   Past Medical History:  Diagnosis Date   Anxiety    Arthritis    back and hips    Depression    Diabetes mellitus type II, controlled ([place]) 08/15/2014   GERD (gastroesophageal reflux disease)    HPV (human papilloma virus) infection    Hyperlipidemia    Lateral meniscus tear    left knee   Menopausal state    PONV (postoperative nausea and vomiting)    slow to wake up    Pre-diabetes    Shortness of breath dyspnea    with exertion    Sleep apnea    cpap -0 setting at 16, uses CPAP nightly   Stress incontinence    Family History  Problem Relation Age of Onset   Hyperlipidemia Mother    Sleep apnea Mother    Depression Mother    Diabetes Father    Hypertension Father    COPD Father    Diabetes Paternal Aunt    Diabetes Paternal Uncle    Alcohol abuse Paternal Uncle    Leukemia Paternal Uncle    Alcohol abuse Paternal [employer] officer  Depression Sister    Allergies  Allergen Reactions   Food Swelling    Big Red Gum makes her tongue swell   Latuda [Lurasidone Hcl] Other (See Comments)    Severe aggitation   Lipitor [Atorvastatin] Other (See Comments)    Muscle aches   Atorvastatin Calcium    Belviq [Lorcaserin Hcl]     Increased appetitie   Contrave [Naltrexone-Bupropion Hcl Er] Other (See Comments)    Sleepy.      Patient Care Team: Lavada Mesi as PCP - General (Family Medicine)   Outpatient Medications Prior to Visit  Medication Sig   citalopram (CELEXA) 40 MG tablet TAKE 1 TABLET (40 MG TOTAL) BY MOUTH DAILY.   ferrous sulfate 325 (65 FE) MG tablet Take 325 mg by mouth daily with breakfast.   lamoTRIgine (LAMICTAL) 200 MG tablet TAKE 1 TABLET (200 MG TOTAL) BY MOUTH DAILY.   mirabegron  ER (MYRBETRIQ) 50 MG TB24 tablet Take 1 tablet (50 mg total) by mouth daily.   pregabalin (LYRICA) 100 MG capsule TAKE 1 CAPSULE (100 MG TOTAL) BY MOUTH 2 (TWO) TIMES DAILY.   Vilazodone HCl (VIIBRYD) 10 MG TABS Take 1 tablet (10 mg total) by mouth daily.   Vitamin D, Ergocalciferol, (DRISDOL) 1.25 MG (50000 UNIT) CAPS capsule TAKE 1 CAPSULE (50,000 UNITS TOTAL) BY MOUTH EVERY 7 (SEVEN) DAYS.   [DISCONTINUED] ALPRAZolam (XANAX) 0.5 MG tablet Take 1 tablet (0.5 mg total) by mouth daily as needed.   [DISCONTINUED] dicyclomine (BENTYL) 10 MG capsule Take 1 capsule (10 mg total) by mouth 2 (two) times daily before a meal. Needs appt   [DISCONTINUED] estradiol (CLIMARA) 0.0375 mg/24hr patch Place 1 patch (0.0375 mg total) onto the skin once a week.   [DISCONTINUED] estradiol (ESTRACE) 0.1 MG/GM vaginal cream Apply 1 gram per vagina every night for 2 weeks, then apply three times a week   [DISCONTINUED] metroNIDAZOLE (FLAGYL) 500 MG tablet Take 1 tablet (500 mg total) by mouth 2 (two) times daily.   [DISCONTINUED] progesterone (PROMETRIUM) 100 MG capsule Take 1 capsule (100 mg total) by mouth daily.   [DISCONTINUED] Semaglutide, 1 MG/DOSE, (OZEMPIC, 1 MG/DOSE,) 4 MG/3ML SOPN Inject 1 mg into the skin once a week.   No facility-administered medications prior to visit.    ROS        Objective:     LMP 12/17/2016 (Exact Date)  BP Readings from Last 3 Encounters:  03/13/22 (!) 145/79  02/06/22 (!) 145/87  01/06/22 118/82   Wt Readings from Last 3 Encounters:  04/07/22 224 lb 12.8 oz (102 kg)  03/13/22 220 lb (99.8 kg)  02/06/22 218 lb (98.9 kg)      Physical Exam   LMP 12/17/2016 (Exact Date)   General Appearance:    Alert, cooperative, no distress, appears stated age  Head:    Normocephalic, without obvious abnormality, atraumatic  Eyes:    PERRL, conjunctiva/corneas clear, EOM's intact, fundi    benign, both eyes  Ears:    Normal TM's and external ear canals, both ears  Nose:    Nares normal, septum midline, mucosa normal, no drainage    or sinus tenderness  Throat:   Lips, mucosa, and tongue normal; teeth and gums normal  Neck:   Supple, symmetrical, trachea midline, no adenopathy;    thyroid:  no enlargement/tenderness/nodules; no carotid   bruit or JVD  Back:     Symmetric, no curvature, ROM normal, no CVA tenderness  Lungs:     Clear to auscultation  bilaterally, respirations unlabored  Chest Wall:    No tenderness or deformity   Heart:    Regular rate and rhythm, S1 and S2 normal, no murmur, rub   or gallop     Abdomen:     Soft, non-tender, bowel sounds active all four quadrants,    no masses, no organomegaly        Extremities:   Extremities normal, atraumatic, no cyanosis or edema  Pulses:   2+ and symmetric all extremities  Skin:   Skin color, texture, turgor normal, no rashes or lesions  Lymph nodes:   Cervical, supraclavicular, and axillary nodes normal  Neurologic:   CNII-XII intact, normal strength, sensation and reflexes    throughout   Assessment & Plan:    Routine Health Maintenance and Physical Exam  Immunization History  Administered Date(s) Administered   Influenza,inj,Quad PF,6+ Mos 07/08/2017, 07/21/2018, 06/18/2021   PFIZER(Purple Top)SARS-COV-2 Vaccination 11/08/2019, 11/28/2019   Pneumococcal Polysaccharide-23 12/29/2014   Tdap 01/07/2013   Zoster Recombinat (Shingrix) 06/18/2021, 09/18/2021    Health Maintenance  Topic Date Due   Hepatitis C Screening  Never done   Diabetic kidney evaluation - Urine ACR  11/27/2021   HEMOGLOBIN A1C  03/18/2022   INFLUENZA VACCINE  05/20/2022   COVID-19 Vaccine (3 - Pfizer risk series) 06/20/2022 (Originally 12/26/2019)   Diabetic kidney evaluation - GFR measurement  09/18/2022   OPHTHALMOLOGY EXAM  11/27/2022   TETANUS/TDAP  01/08/2023   MAMMOGRAM  03/21/2023   FOOT EXAM  06/05/2023   PAP SMEAR-Modifier  11/29/2023   COLONOSCOPY (Pts 45-80yr Insurance coverage will need to be confirmed)   05/13/2028   HIV Screening  Completed   Zoster Vaccines- Shingrix  Completed   HPV VACCINES  Aged Out    Discussed health benefits of physical activity, and encouraged her to engage in regular exercise appropriate for her age and condition.  .Marland KitchenAmy was seen today for annual exam.  Diagnoses and all orders for this visit:  Routine physical examination -     TSH -     Lipid Panel w/reflex Direct LDL -     COMPLETE METABOLIC PANEL WITH GFR -     CBC with Differential/Platelet -     Hemoglobin A1c -     Vitamin D (25 hydroxy) -     Urine Microalbumin w/creat. ratio  Type II diabetes mellitus with nephropathy (HCC) -     COMPLETE METABOLIC PANEL WITH GFR -     Hemoglobin A1c -     Urine Microalbumin w/creat. ratio -     Semaglutide, 2 MG/DOSE, (OZEMPIC, 2 MG/DOSE,) 8 MG/3ML SOPN; Inject 2 mg into the skin once a week.  Hypothyroidism, unspecified type -     TSH  Hyperlipidemia LDL goal <70 -     Lipid Panel w/reflex Direct LDL  Vitamin D deficiency -     Vitamin D (25 hydroxy)   ..Marland KitchenDiscussed 150 minutes of exercise a week.  Encouraged vitamin D 1000 units and Calcium '1300mg'$  or 4 servings of dairy a day.  Will check A1C in labs Increased ozempic to '2mg'$  weekly for better A1C reduction/weight loss/may help with intermittent diarrhea Fasting labs ordered PHQ/GAD stable BP to goal Mammogram/colonoscopy/pap UTD Shingles and pneumonia vaccine UTD.  Needs flu shot Follow up in 3 months   Return in about 3 months (around 09/04/2022), or if symptoms worsen or fail to improve.     JIran Planas PA-C

## 2022-06-11 ENCOUNTER — Other Ambulatory Visit (HOSPITAL_BASED_OUTPATIENT_CLINIC_OR_DEPARTMENT_OTHER): Payer: Self-pay

## 2022-06-11 ENCOUNTER — Other Ambulatory Visit: Payer: Self-pay

## 2022-06-11 ENCOUNTER — Telehealth (INDEPENDENT_AMBULATORY_CARE_PROVIDER_SITE_OTHER): Payer: 59 | Admitting: Psychiatry

## 2022-06-11 ENCOUNTER — Encounter (HOSPITAL_COMMUNITY): Payer: Self-pay | Admitting: Psychiatry

## 2022-06-11 DIAGNOSIS — F331 Major depressive disorder, recurrent, moderate: Secondary | ICD-10-CM

## 2022-06-11 DIAGNOSIS — F410 Panic disorder [episodic paroxysmal anxiety] without agoraphobia: Secondary | ICD-10-CM

## 2022-06-11 DIAGNOSIS — F411 Generalized anxiety disorder: Secondary | ICD-10-CM | POA: Diagnosis not present

## 2022-06-11 MED ORDER — LAMOTRIGINE 25 MG PO TABS
50.0000 mg | ORAL_TABLET | Freq: Every day | ORAL | 0 refills | Status: DC
  Filled 2022-06-11: qty 60, 30d supply, fill #0

## 2022-06-11 MED ORDER — LAMOTRIGINE 25 MG PO TABS
25.0000 mg | ORAL_TABLET | Freq: Every day | ORAL | 0 refills | Status: DC
  Filled 2022-06-11: qty 60, 60d supply, fill #0

## 2022-06-11 MED ORDER — LAMOTRIGINE 200 MG PO TABS
ORAL_TABLET | Freq: Every day | ORAL | 2 refills | Status: DC
  Filled 2022-06-11: qty 30, 30d supply, fill #0
  Filled 2022-07-19: qty 30, 30d supply, fill #1
  Filled 2022-08-21: qty 30, 30d supply, fill #2

## 2022-06-11 MED ORDER — CITALOPRAM HYDROBROMIDE 40 MG PO TABS
ORAL_TABLET | Freq: Every day | ORAL | 2 refills | Status: DC
  Filled 2022-06-11: qty 30, 30d supply, fill #0
  Filled 2022-07-08: qty 30, 30d supply, fill #1
  Filled 2022-08-05: qty 30, 30d supply, fill #2

## 2022-06-11 NOTE — Addendum Note (Signed)
Addended by: Merian Capron on: 06/11/2022 11:27 AM   Modules accepted: Orders

## 2022-06-11 NOTE — Progress Notes (Signed)
Patient ID: Shannon [name], female   DOB: 06-22-1966, 56 y.o.   MRN: [id number]  [facility] Follow up visit  Shannon [name] [id number] 56 y.o.  06/11/2022 10:28 AM  Virtual Visit via Video Note  I connected with Shannon [name] on 06/11/22 at 10:15 AM EDT by a video enabled telemedicine application and verified that I am speaking with the correct person using two identifiers.  Location: Patient: home Provider: home office   I discussed the limitations of evaluation and management by telemedicine and the availability of in person appointments. The patient expressed understanding and agreed to proceed.      I discussed the assessment and treatment plan with the patient. The patient was provided an opportunity to ask questions and all were answered. The patient agreed with the plan and demonstrated an understanding of the instructions.   The patient was advised to call back or seek an in-person evaluation if the symptoms worsen or if the condition fails to improve as anticipated.  I provided 15 minutes of non-face-to-face time during this encounter.       Chief Complaint:  Depression follow up       HPI: Has been feeling low depressed, had to stop hormone therapy and feels that may have triggered she is following with OB and reviewing her underlying need  Overall job stress is there but feels getting moody, depressed  Modifying factor: mom, son Aggravating factor : lonliness,    Duration  7 -8 years Severity depressed   Past Psychiatric History/Hospitalization(s) Manged for depression and mood symptoms for more then 20 years.  Prior Suicide Attempts: No  Medical History; Past Medical History:  Diagnosis Date   Anxiety    Arthritis    back and hips    Depression    Diabetes mellitus type II, controlled ([place]) 08/15/2014   GERD (gastroesophageal reflux disease)    HPV (human papilloma virus) infection    Hyperlipidemia    Lateral meniscus tear     left knee   Menopausal state    PONV (postoperative nausea and vomiting)    slow to wake up    Pre-diabetes    Shortness of breath dyspnea    with exertion    Sleep apnea    cpap -0 setting at 16, uses CPAP nightly   Stress incontinence     Allergies: Allergies  Allergen Reactions   Food Swelling    Big Red Gum makes her tongue swell   Latuda [Lurasidone Hcl] Other (See Comments)    Severe aggitation   Lipitor [Atorvastatin] Other (See Comments)    Muscle aches   Atorvastatin Calcium    Belviq [Lorcaserin Hcl]     Increased appetitie   Contrave [Naltrexone-Bupropion Hcl Er] Other (See Comments)    Sleepy.    Medications: Outpatient Encounter Medications as of 06/11/2022  Medication Sig   lamoTRIgine (LAMICTAL) 25 MG tablet Take 1 tablet (25 mg total) by mouth daily. Adding '50mg'$  a day in addition to '200mg'$    ALPRAZolam (XANAX) 0.5 MG tablet Take 1 tablet (0.5 mg total) by mouth daily as needed.   citalopram (CELEXA) 40 MG tablet TAKE 1 TABLET (40 MG TOTAL) BY MOUTH DAILY.   ferrous sulfate 325 (65 FE) MG tablet Take 325 mg by mouth daily with breakfast.   lamoTRIgine (LAMICTAL) 200 MG tablet TAKE 1 TABLET (200 MG TOTAL) BY MOUTH DAILY.   mirabegron ER (MYRBETRIQ) 50 MG TB24 tablet Take 1 tablet (50 mg total) by  mouth daily.   pregabalin (LYRICA) 100 MG capsule TAKE 1 CAPSULE (100 MG TOTAL) BY MOUTH 2 (TWO) TIMES DAILY.   Semaglutide, 2 MG/DOSE, (OZEMPIC, 2 MG/DOSE,) 8 MG/3ML SOPN Inject 2 mg into the skin once a week.   Vilazodone HCl (VIIBRYD) 10 MG TABS Take 1 tablet (10 mg total) by mouth daily.   Vitamin D, Ergocalciferol, (DRISDOL) 1.25 MG (50000 UNIT) CAPS capsule TAKE 1 CAPSULE (50,000 UNITS TOTAL) BY MOUTH EVERY 7 (SEVEN) DAYS.   [DISCONTINUED] citalopram (CELEXA) 40 MG tablet TAKE 1 TABLET (40 MG TOTAL) BY MOUTH DAILY.   [DISCONTINUED] lamoTRIgine (LAMICTAL) 200 MG tablet TAKE 1 TABLET (200 MG TOTAL) BY MOUTH DAILY.   No facility-administered encounter medications on  file as of 06/11/2022.     Family History; Family History  Problem Relation Age of Onset   Hyperlipidemia Mother    Sleep apnea Mother    Depression Mother    Diabetes Father    Hypertension Father    COPD Father    Diabetes Paternal Aunt    Diabetes Paternal Uncle    Alcohol abuse Paternal Uncle    Leukemia Paternal Uncle    Alcohol abuse Paternal Grandfather    Depression Sister       Labs:  No results found for this or any previous visit (from the past 2160 hour(s)).        Mental Status Examination;   Psychiatric Specialty Exam: Physical Exam  Review of Systems  Cardiovascular:  Negative for chest pain.  Neurological:  Negative for tremors.  Psychiatric/Behavioral:  Positive for depression.     Last menstrual period 12/17/2016.There is no height or weight on file to calculate BMI.  General Appearance:casual  Eye Contact::  fair  Speech:  Normal Rate  Volume:  Normal  Mood : depressed  Affect:  congruent  Thought Process:  Coherent  Orientation:  Full (Time, Place, and Person)  Thought Content:  Rumination  Suicidal Thoughts:  No  Homicidal Thoughts:  No  Memory:  Immediate;   Fair Recent;   Fair  Judgement:  Fair  Insight:  Shallow  Psychomotor Activity:  Normal  Concentration:  Fair  Recall:  Fair  Akathisia:  Negative  Handed:  Right  AIMS (if indicated):     Assets:  Communication Skills Desire for Improvement Financial Resources/Insurance Housing  Sleep:        Assessment: Axis I: mood disorder unspecified. Rule out mood disorder secondary to general medical condition. Major depressive disorder recurrent moderate. Rule out panic disorder. Generalized anxiety disorder   Axis III:  Past Medical History:  Diagnosis Date   Anxiety    Arthritis    back and hips    Depression    Diabetes mellitus type II, controlled ([place]) 08/15/2014   GERD (gastroesophageal reflux disease)    HPV (human papilloma virus) infection    Hyperlipidemia     Lateral meniscus tear    left knee   Menopausal state    PONV (postoperative nausea and vomiting)    slow to wake up    Pre-diabetes    Shortness of breath dyspnea    with exertion    Sleep apnea    cpap -0 setting at 16, uses CPAP nightly   Stress incontinence     Axis IV: psychosocial   Treatment Plan and Summary:  Prior documentation reviewed   Depression: depressed feels moody, increase lamictal to '250mg'$  from '200mg'$ , no rash Anxiety :fluctuates, continue celexa, xanax, consider therapy  Panic attacks  sporadic, irregular, takes xanax prn  Increasing lamictal as above Fu 4 -5 weeks or earlier if needed    , Merian Capron, MD 06/11/2022

## 2022-06-12 ENCOUNTER — Other Ambulatory Visit (HOSPITAL_BASED_OUTPATIENT_CLINIC_OR_DEPARTMENT_OTHER): Payer: Self-pay

## 2022-06-16 DIAGNOSIS — E559 Vitamin D deficiency, unspecified: Secondary | ICD-10-CM | POA: Diagnosis not present

## 2022-06-16 DIAGNOSIS — E1121 Type 2 diabetes mellitus with diabetic nephropathy: Secondary | ICD-10-CM | POA: Diagnosis not present

## 2022-06-16 DIAGNOSIS — E039 Hypothyroidism, unspecified: Secondary | ICD-10-CM | POA: Diagnosis not present

## 2022-06-16 DIAGNOSIS — E785 Hyperlipidemia, unspecified: Secondary | ICD-10-CM | POA: Diagnosis not present

## 2022-06-16 DIAGNOSIS — Z0000 Encounter for general adult medical examination without abnormal findings: Secondary | ICD-10-CM | POA: Diagnosis not present

## 2022-06-17 ENCOUNTER — Other Ambulatory Visit (HOSPITAL_BASED_OUTPATIENT_CLINIC_OR_DEPARTMENT_OTHER): Payer: Self-pay

## 2022-06-17 LAB — CBC WITH DIFFERENTIAL/PLATELET
Absolute Monocytes: 377 cells/uL (ref 200–950)
Basophils Absolute: 59 cells/uL (ref 0–200)
Basophils Relative: 0.8 %
Eosinophils Absolute: 296 cells/uL (ref 15–500)
Eosinophils Relative: 4 %
HCT: 41.6 % (ref 35.0–45.0)
Hemoglobin: 14.5 g/dL (ref 11.7–15.5)
Lymphs Abs: 2213 cells/uL (ref 850–3900)
MCH: 32 pg (ref 27.0–33.0)
MCHC: 34.9 g/dL (ref 32.0–36.0)
MCV: 91.8 fL (ref 80.0–100.0)
MPV: 8.8 fL (ref 7.5–12.5)
Monocytes Relative: 5.1 %
Neutro Abs: 4455 cells/uL (ref 1500–7800)
Neutrophils Relative %: 60.2 %
Platelets: 391 10*3/uL (ref 140–400)
RBC: 4.53 10*6/uL (ref 3.80–5.10)
RDW: 12.6 % (ref 11.0–15.0)
Total Lymphocyte: 29.9 %
WBC: 7.4 10*3/uL (ref 3.8–10.8)

## 2022-06-17 LAB — COMPLETE METABOLIC PANEL WITH GFR
AG Ratio: 1.8 (calc) (ref 1.0–2.5)
ALT: 27 U/L (ref 6–29)
AST: 22 U/L (ref 10–35)
Albumin: 4.3 g/dL (ref 3.6–5.1)
Alkaline phosphatase (APISO): 82 U/L (ref 37–153)
BUN: 14 mg/dL (ref 7–25)
CO2: 25 mmol/L (ref 20–32)
Calcium: 9.2 mg/dL (ref 8.6–10.4)
Chloride: 105 mmol/L (ref 98–110)
Creat: 0.74 mg/dL (ref 0.50–1.03)
Globulin: 2.4 g/dL (calc) (ref 1.9–3.7)
Glucose, Bld: 106 mg/dL — ABNORMAL HIGH (ref 65–99)
Potassium: 4.1 mmol/L (ref 3.5–5.3)
Sodium: 143 mmol/L (ref 135–146)
Total Bilirubin: 0.5 mg/dL (ref 0.2–1.2)
Total Protein: 6.7 g/dL (ref 6.1–8.1)
eGFR: 96 mL/min/{1.73_m2} (ref >=60)

## 2022-06-17 LAB — MICROALBUMIN / CREATININE URINE RATIO
Creatinine, Urine: 167 mg/dL (ref 20–275)
Microalb Creat Ratio: 26 mcg/mg creat (ref <30)
Microalb, Ur: 4.3 mg/dL

## 2022-06-17 LAB — LIPID PANEL W/REFLEX DIRECT LDL
Cholesterol: 209 mg/dL — ABNORMAL HIGH (ref <200)
HDL: 43 mg/dL — ABNORMAL LOW (ref >=50)
LDL Cholesterol (Calc): 131 mg/dL (calc) — ABNORMAL HIGH
Non-HDL Cholesterol (Calc): 166 mg/dL (calc) — ABNORMAL HIGH (ref <130)
Total CHOL/HDL Ratio: 4.9 (calc) (ref <5.0)
Triglycerides: 209 mg/dL — ABNORMAL HIGH (ref <150)

## 2022-06-17 LAB — HEMOGLOBIN A1C
Hgb A1c MFr Bld: 5.8 % of total Hgb — ABNORMAL HIGH (ref <5.7)
Mean Plasma Glucose: 120 mg/dL
eAG (mmol/L): 6.6 mmol/L

## 2022-06-17 LAB — TSH: TSH: 1.49 mIU/L

## 2022-06-17 LAB — VITAMIN D 25 HYDROXY (VIT D DEFICIENCY, FRACTURES): Vit D, 25-Hydroxy: 55 ng/mL (ref 30–100)

## 2022-06-17 NOTE — Progress Notes (Signed)
Shannon [name],   Kidney and liver look good.  A1C controlled at 5.8.  Hemoglobin looks good.  Cholesterol is not to goal and we really should try another statin. Have you only tried lipitor??we could try livalo and see if you tolerate any better.   Marland Kitchen.The 10-year ASCVD risk score (Arnett DK, et al., 2019) is: 4.8%   Values used to calculate the score:     Age: 56 years     Sex: Female     Is Non-Hispanic African American: No     Diabetic: Yes     Tobacco smoker: No     Systolic Blood Pressure: 167 mmHg     Is BP treated: No     HDL Cholesterol: 43 mg/dL     Total Cholesterol: 209 mg/dL

## 2022-06-18 DIAGNOSIS — G4733 Obstructive sleep apnea (adult) (pediatric): Secondary | ICD-10-CM | POA: Diagnosis not present

## 2022-06-20 ENCOUNTER — Other Ambulatory Visit (HOSPITAL_COMMUNITY): Payer: Self-pay | Admitting: Psychiatry

## 2022-06-20 ENCOUNTER — Other Ambulatory Visit (HOSPITAL_BASED_OUTPATIENT_CLINIC_OR_DEPARTMENT_OTHER): Payer: Self-pay

## 2022-06-24 ENCOUNTER — Other Ambulatory Visit (HOSPITAL_BASED_OUTPATIENT_CLINIC_OR_DEPARTMENT_OTHER): Payer: Self-pay

## 2022-06-24 MED ORDER — VILAZODONE HCL 10 MG PO TABS: 10.0000 mg | ORAL_TABLET | Freq: Every day | ORAL | 1 refills | Status: DC

## 2022-06-26 ENCOUNTER — Ambulatory Visit: Payer: 59 | Admitting: Obstetrics and Gynecology

## 2022-06-26 ENCOUNTER — Other Ambulatory Visit (HOSPITAL_BASED_OUTPATIENT_CLINIC_OR_DEPARTMENT_OTHER): Payer: Self-pay

## 2022-06-26 ENCOUNTER — Encounter: Payer: Self-pay | Admitting: Physician Assistant

## 2022-06-26 ENCOUNTER — Encounter: Payer: Self-pay | Admitting: Obstetrics and Gynecology

## 2022-06-26 VITALS — BP 148/82 | HR 91 | Resp 16 | Ht 65.5 in | Wt 219.0 lb

## 2022-06-26 DIAGNOSIS — D250 Submucous leiomyoma of uterus: Secondary | ICD-10-CM

## 2022-06-26 DIAGNOSIS — N83201 Unspecified ovarian cyst, right side: Secondary | ICD-10-CM

## 2022-06-26 DIAGNOSIS — N951 Menopausal and female climacteric states: Secondary | ICD-10-CM | POA: Diagnosis not present

## 2022-06-26 DIAGNOSIS — N920 Excessive and frequent menstruation with regular cycle: Secondary | ICD-10-CM

## 2022-06-26 MED ORDER — TRANEXAMIC ACID 650 MG PO TABS
1300.0000 mg | ORAL_TABLET | Freq: Three times a day (TID) | ORAL | 2 refills | Status: DC
  Filled 2022-06-26: qty 30, 5d supply, fill #0

## 2022-06-26 NOTE — Progress Notes (Signed)
GYNECOLOGY OFFICE VISIT NOTE  History:   Shannon [name] is a 56 y.o. G1P1001 here today for follow up from her [place] and a couple other issues.   She had her IUD removed in May and was switched to a different HRT. She had persistent diarrhea so we discussed possible discontinuation but if it persisted then to see PCP.  Since we talked about it she did stop it and she had a return of the diarrhea. She has not been on HRT and has not had issues with hot flashes.    Since we last talked, she had a period - it was 9 days and for 5 of those days she has had heavy bleeding. She passed golf ball size clots and her periods were painful.   Additionally, she had a follow up US for a right ovarian cyst which was 2.9 cm and mildly complex and a thickened EL. She had had an EMB at the time of her IUD removal but it was insufficient amount of tissue. This was of less concern due to her having an IUD in place prior to the EMB. She had a repeat US and her EL measured 8 mm by my review which was reassuring. Her ovarian cyst went from 2.9 cm to 2.4 cm.     The following portions of the patient's history were reviewed and updated as appropriate: allergies, current medications, past family history, past medical history, past social history, past surgical history and problem list.   Health Maintenance:   Normal pap and negative HRHPV: Diagnosis  Date Value Ref Range Status  11/28/2020   Final   - Negative for intraepithelial lesion or malignancy (NILM)     Normal mammogram on 03/21/2022.   Review of Systems:  Pertinent items noted in HPI and remainder of comprehensive ROS otherwise negative.  Physical Exam:  BP (!) 148/82   Pulse 91   Resp 16   Ht 5' 5.5" (1.664 m)   Wt 219 lb (99.3 kg)   LMP 12/17/2016 (Exact Date)   BMI 35.89 kg/m  CONSTITUTIONAL: Well-developed, well-nourished female in no acute distress.  HEENT:  Normocephalic, atraumatic. External right and left ear normal. No scleral icterus.  NECK:  Normal range of motion, supple, no masses noted on observation SKIN: No rash noted. Not diaphoretic. No erythema. No pallor. MUSCULOSKELETAL: Normal range of motion. No edema noted. NEUROLOGIC: Alert and oriented to person, place, and time. Normal muscle tone coordination. No cranial nerve deficit noted. PSYCHIATRIC: Normal mood and affect. Normal behavior. Normal judgment and thought content.  CARDIOVASCULAR: Normal heart rate noted RESPIRATORY: Effort and breath sounds normal, no problems with respiration noted ABDOMEN: No masses noted. No other overt distention noted.    PELVIC: Deferred  Labs and Imaging No results found for this or any previous visit (from the past 168 hour(s)). US PELVIC COMPLETE WITH TRANSVAGINAL  Result Date: 05/29/2022 CLINICAL DATA:  Follow-up examination for complex right ovarian cyst. EXAM: TRANSABDOMINAL AND TRANSVAGINAL ULTRASOUND OF PELVIS TECHNIQUE: Both transabdominal and transvaginal ultrasound examinations of the pelvis were performed. Transabdominal technique was performed for global imaging of the pelvis including uterus, ovaries, adnexal regions, and pelvic cul-de-sac. It was necessary to proceed with endovaginal exam following the transabdominal exam to visualize the endometrium and ovaries. COMPARISON:  Prior ultrasound from 02/25/2022. FINDINGS: Uterus Measurements: 12.7 x 7.0 x 8.1 cm = volume: 376.2 mL. Enlarged fibroid uterus again seen, with multiple fibroids present as follows; 1. 5.3 x 3.7 x 3.4 cm intramural  to subserosal fibroid present at the anterior right uterine body. 2. 4.1 x 3.0 x 3.5 cm partially calcified intramural to subserosal fibroid at the left uterine body. 3. 2.9 x 3.3 x 2.7 cm submucosal fibroid present at the central uterus. Lesion partially deforms the adjacent endometrial complex (image 66). Endometrium Thickness: Up to 17.6 mm. No IUD visualized on today's exam. Small volume simple fluid noted within the cervical canal. Right ovary  Measurements: 4.0 x 2.8 x 2.3 cm = volume: 13.7 mL. Again seen is a mildly complex hypoechoic cyst, measuring slightly smaller on today's exam at up to 2.4 cm in size, previously 2.9 cm. Lesion demonstrates fairly homogeneous low-level internal echoes. No visible internal vascularity or solid component. Left ovary Not visualized.  No adnexal mass. Other findings No abnormal free fluid. IMPRESSION: 1. Persistent mildly complex right ovarian cyst, measuring slightly smaller on today's exam at 2.4 cm in size (previously 2.9 cm). Given the persistence of this finding, a possible endometrioma could be considered. 2. Enlarged fibroid uterus as detailed above, 1 of which measuring 2.9 cm is submucosal in location. 3. Thickened endometrial complex measuring up to 17.6 mm with probable submucosal fibroid as above. Again, endometrial thickness is considered abnormal for an asymptomatic post-menopausal female. Endometrial sampling should be considered to exclude carcinoma if not already performed. Additionally, correlation with sonohysterography to confirm a submucosal fibroid versus endometrial based lesion could also be performed for further evaluation as warranted. 4. Non visualization of previously seen IUD. Query interval removal. Electronically Signed   By: Jeannine Boga M.D.   On: 05/29/2022 19:34    Assessment and Plan:  Sybil was seen today for discuss test results.  Diagnoses and all orders for this visit:  Menopausal symptoms - These hot flashes are currently absent and she will remain off HRT.   Right ovarian cyst - Reviewed images and small size and decreasing size. We discussed it is reasonable and likely best to observe this. We did also discuss the option for removal of the ovary but reviewed it does change risks of surgery compared to plan for fibroid. She would like to observe.   Fibroids, submucosal - Uterine fibroids: The patient's fibroids are symptomatic (bleeding) and treatment options  of expectant management, medical therapy, and surgical therapy were discussed. - Expectant management - The patient's fibroids were discussed and expectant management was offered with strict precautions. - TXA - this medication was discussed as a means to control vaginal bleeding. Discussed it would not impact growth of the fibroids either way. Its main advantage is avoiding hormonal or surgical therapy and gives an option for therapy besides expectant management.  - We discussed progesterone only options including - POP, Depo Provera and Lng-IUD.  We reviewed risks and benefits and proper use. Progesterone Only Birth Control Pills (POP)- The use of progesterone only birth control pills was discussed with the patient. She definitely does not wish to do the LNG-IUD based on her past.  - We discussed the GnRH-agonists and antagonists. Reviewed both short term and long term impact of these medications. Reviewed short term impact of Depo Lupron (agonist) on bleeding.  - We discussed surgical/procedural options available: RFA (I.e. Sonata), [place], myomectomy and hysterectomy. We discussed the risks and benefits for each of these specific procedures. For [place], recommended preop MRI and referral to interventional radiology.  For the sonata, reviewed that we would need to sign a special consent form for this procedure. We discussed the types and sizes of fibroids that  are candidates for hysteroscopic resection of fibroids as well.  - Following counseling, the patient would like to  do TXA and then ultimately hysteroscopic resection of the submucosal fibroid and then ablation to follow.  -     tranexamic acid (LYSTEDA) 650 MG TABS tablet; Take 2 tablets (1,300 mg total) by mouth 3 (three) times daily. Take during menses for a maximum of five days  Menorrhagia with regular cycle -     tranexamic acid (LYSTEDA) 650 MG TABS tablet; Take 2 tablets (1,300 mg total) by mouth 3 (three) times daily. Take during menses for a  maximum of five days    Routine preventative health maintenance measures emphasized. Please refer to After Visit Summary for other counseling recommendations.   No follow-ups on file.  Radene Gunning, MD, [place] for [facility], [place]

## 2022-06-27 ENCOUNTER — Other Ambulatory Visit (HOSPITAL_BASED_OUTPATIENT_CLINIC_OR_DEPARTMENT_OTHER): Payer: Self-pay

## 2022-06-27 MED ORDER — DICYCLOMINE HCL 10 MG PO CAPS: 10.0000 mg | ORAL_CAPSULE | Freq: Three times a day (TID) | ORAL | 1 refills | Status: DC

## 2022-07-01 ENCOUNTER — Telehealth: Payer: Self-pay | Admitting: *Deleted

## 2022-07-01 NOTE — Telephone Encounter (Signed)
Patient advised of surgery scheduling process.

## 2022-07-04 ENCOUNTER — Other Ambulatory Visit (HOSPITAL_COMMUNITY): Payer: Self-pay | Admitting: Psychiatry

## 2022-07-07 ENCOUNTER — Other Ambulatory Visit (HOSPITAL_BASED_OUTPATIENT_CLINIC_OR_DEPARTMENT_OTHER): Payer: Self-pay

## 2022-07-07 ENCOUNTER — Other Ambulatory Visit (HOSPITAL_COMMUNITY): Payer: Self-pay | Admitting: Psychiatry

## 2022-07-07 ENCOUNTER — Telehealth (HOSPITAL_COMMUNITY): Payer: Self-pay

## 2022-07-07 MED ORDER — LAMOTRIGINE 25 MG PO TABS
50.0000 mg | ORAL_TABLET | Freq: Every day | ORAL | 0 refills | Status: DC
  Filled 2022-07-07 – 2022-07-08 (×2): qty 60, 30d supply, fill #0

## 2022-07-07 MED ORDER — ALPRAZOLAM 0.5 MG PO TABS
0.5000 mg | ORAL_TABLET | Freq: Every day | ORAL | 0 refills | Status: DC | PRN
  Filled 2022-07-07: qty 30, 30d supply, fill #0

## 2022-07-07 NOTE — Telephone Encounter (Signed)
Patient needs a refill on Xanax sent to [facility] Last refill 08/16 Next ov 10/04

## 2022-07-07 NOTE — Telephone Encounter (Signed)
sent 

## 2022-07-08 ENCOUNTER — Other Ambulatory Visit (HOSPITAL_BASED_OUTPATIENT_CLINIC_OR_DEPARTMENT_OTHER): Payer: Self-pay

## 2022-07-11 ENCOUNTER — Other Ambulatory Visit: Payer: Self-pay | Admitting: Physician Assistant

## 2022-07-21 ENCOUNTER — Telehealth (HOSPITAL_COMMUNITY): Payer: Self-pay | Admitting: Psychiatry

## 2022-07-21 ENCOUNTER — Other Ambulatory Visit (HOSPITAL_BASED_OUTPATIENT_CLINIC_OR_DEPARTMENT_OTHER): Payer: Self-pay

## 2022-07-21 MED ORDER — VILAZODONE HCL 10 MG PO TABS
10.0000 mg | ORAL_TABLET | Freq: Every day | ORAL | 0 refills | Status: DC
  Filled 2022-07-21: qty 30, 30d supply, fill #0

## 2022-07-21 NOTE — Telephone Encounter (Signed)
Medication management - Telephone call with patient to inform Dr. De Nurse had sent in her requested new Vilazodone order to her [facility] as she requested.

## 2022-07-21 NOTE — Telephone Encounter (Signed)
Patient called stating her pharmacy no longer has  Vilazodone HCl (VIIBRYD) 10 MG TABS Take 1 tablet (10 mg total) by mouth daily.  on her medication profile. Verified this statement is accurate although medication list from last visit with provider indicated this medication is active. Requesting refill be sent to: [facility] (Ph: [phone]).  Last ordered: 06/26/2022  Last visit:  06/11/2022  Next visit:  07/23/2022

## 2022-07-22 ENCOUNTER — Other Ambulatory Visit (HOSPITAL_BASED_OUTPATIENT_CLINIC_OR_DEPARTMENT_OTHER): Payer: Self-pay

## 2022-07-23 ENCOUNTER — Encounter (HOSPITAL_COMMUNITY): Payer: Self-pay | Admitting: Psychiatry

## 2022-07-23 ENCOUNTER — Telehealth (INDEPENDENT_AMBULATORY_CARE_PROVIDER_SITE_OTHER): Payer: 59 | Admitting: Psychiatry

## 2022-07-23 DIAGNOSIS — F411 Generalized anxiety disorder: Secondary | ICD-10-CM

## 2022-07-23 DIAGNOSIS — F331 Major depressive disorder, recurrent, moderate: Secondary | ICD-10-CM | POA: Diagnosis not present

## 2022-07-23 DIAGNOSIS — F410 Panic disorder [episodic paroxysmal anxiety] without agoraphobia: Secondary | ICD-10-CM

## 2022-07-23 NOTE — Progress Notes (Signed)
Patient ID: Shannon [name], female   DOB: 08/13/1966, 56 y.o.   MRN: [id number]  [facility] Outpatient Follow up visit  Shannon [name] [id number] 56 y.o.  07/23/2022 8:32 AM  Virtual Visit via Video Note  I connected with Shannon [name] on 07/23/22 at  8:30 AM EDT by a video enabled telemedicine application and verified that I am speaking with the correct person using two identifiers.  Location: Patient: home Provider: home office   I discussed the limitations of evaluation and management by telemedicine and the availability of in person appointments. The patient expressed understanding and agreed to proceed.      I discussed the assessment and treatment plan with the patient. The patient was provided an opportunity to ask questions and all were answered. The patient agreed with the plan and demonstrated an understanding of the instructions.   The patient was advised to call back or seek an in-person evaluation if the symptoms worsen or if the condition fails to improve as anticipated.  I provided 15 minutes  of non-face-to-face time during this encounter including chart review      Chief Complaint:  Depression follow up       HPI: Doing better since last visit increased lamictal to 250mg, no rash Job stress is improved and she is going out more Overall tolerating and feeling positive Panic attacks are more stable   Overall job stress is there but feels getting moody, depressed  Modifying factor: mom, son Aggravating factor : can be job   Duration  7 -8 years Severity improved   Past Psychiatric History/Hospitalization(s) Manged for depression and mood symptoms for more then 20 years.  Prior Suicide Attempts: No  Medical History; Past Medical History:  Diagnosis Date   Anxiety    Arthritis    back and hips    Depression    Diabetes mellitus type II, controlled (HCC) 08/15/2014   GERD (gastroesophageal reflux disease)    HPV (human papilloma virus)  infection    Hyperlipidemia    Lateral meniscus tear    left knee   Menopausal state    PONV (postoperative nausea and vomiting)    slow to wake up    Pre-diabetes    Shortness of breath dyspnea    with exertion    Sleep apnea    cpap -0 setting at 16, uses CPAP nightly   Stress incontinence     Allergies: Allergies  Allergen Reactions   Food Swelling    Big Red Gum makes her tongue swell   Latuda [Lurasidone Hcl] Other (See Comments)    Severe aggitation   Lipitor [Atorvastatin] Other (See Comments)    Muscle aches   Atorvastatin Calcium    Belviq [Lorcaserin Hcl]     Increased appetitie   Contrave [Naltrexone-Bupropion Hcl Er] Other (See Comments)    Sleepy.    Medications: Outpatient Encounter Medications as of 07/23/2022  Medication Sig   ALPRAZolam (XANAX) 0.5 MG tablet Take 1 tablet (0.5 mg total) by mouth daily as needed.   citalopram (CELEXA) 40 MG tablet TAKE 1 TABLET (40 MG TOTAL) BY MOUTH DAILY.   dicyclomine (BENTYL) 10 MG capsule TAKE 1 CAPSULE (10 MG TOTAL) BY MOUTH 3 (THREE) TIMES DAILY BEFORE MEALS.   ferrous sulfate 325 (65 FE) MG tablet Take 325 mg by mouth daily with breakfast.   lamoTRIgine (LAMICTAL) 200 MG tablet TAKE 1 TABLET (200 MG TOTAL) BY MOUTH DAILY.   lamoTRIgine (LAMICTAL) 25 MG tablet Take   2 tablets (50 mg total) by mouth daily. Adding 17m a day in addition to 2038m  mirabegron ER (MYRBETRIQ) 50 MG TB24 tablet Take 1 tablet (50 mg total) by mouth daily.   pregabalin (LYRICA) 100 MG capsule TAKE 1 CAPSULE (100 MG TOTAL) BY MOUTH 2 (TWO) TIMES DAILY.   Semaglutide, 2 MG/DOSE, (OZEMPIC, 2 MG/DOSE,) 8 MG/3ML SOPN Inject 2 mg into the skin once a week.   tranexamic acid (LYSTEDA) 650 MG TABS tablet Take 2 tablets (1,300 mg total) by mouth 3 (three) times daily. Take during menses for a maximum of five days   Vilazodone HCl (VIIBRYD) 10 MG TABS Take 1 tablet (10 mg total) by mouth daily.   Vitamin D, Ergocalciferol, (DRISDOL) 1.25 MG (50000  UNIT) CAPS capsule TAKE 1 CAPSULE (50,000 UNITS TOTAL) BY MOUTH EVERY 7 (SEVEN) DAYS.   No facility-administered encounter medications on file as of 07/23/2022.     Family History; Family History  Problem Relation Age of Onset   Hyperlipidemia Mother    Sleep apnea Mother    Depression Mother    Diabetes Father    Hypertension Father    COPD Father    Diabetes Paternal Aunt    Diabetes Paternal Uncle    Alcohol abuse Paternal Uncle    Leukemia Paternal Uncle    Alcohol abuse Paternal Grandfather    Depression Sister       Labs:  Recent Results (from the past 2160 hour(s))  TSH     Status: None   Collection Time: 06/16/22  8:15 AM  Result Value Ref Range   TSH 1.49 mIU/L    Comment:           Reference Range .           > or = 20 Years  0.40-4.50 .                Pregnancy Ranges           First trimester    0.26-2.66           Second trimester   0.55-2.73           Third trimester    0.43-2.91   Lipid Panel w/reflex Direct LDL     Status: Abnormal   Collection Time: 06/16/22  8:15 AM  Result Value Ref Range   Cholesterol 209 (H) <200 mg/dL   HDL 43 (L) > OR = 50 mg/dL   Triglycerides 209 (H) <150 mg/dL    Comment: . If a non-fasting specimen was collected, consider repeat triglyceride testing on a fasting specimen if clinically indicated.  JaYates Decampt al. J. of Clin. Lipidol. [id number]:998-338. Marland Kitchen  LDL Cholesterol (Calc) 131 (H) mg/dL (calc)    Comment: Reference range: <100 . Desirable range <100 mg/dL for primary prevention;   <70 mg/dL for patients with CHD or diabetic patients  with > or = 2 CHD risk factors. . Marland KitchenDL-C is now calculated using the Martin-Hopkins  calculation, which is a validated novel method providing  better accuracy than the Friedewald equation in the  estimation of LDL-C.  MaCresenciano Genret al. JAAnnamaria [id number]  (http://education.[facility].com/faq/FAQ164)    Total CHOL/HDL Ratio 4.9 <5.0 (calc)   Non-HDL  Cholesterol (Calc) 166 (H) <130 mg/dL (calc)    Comment: For patients with diabetes plus 1 major ASCVD risk  factor, treating to a non-HDL-C goal of <100 mg/dL  (LDL-C of <70 mg/dL) is considered a therapeutic  option.  COMPLETE METABOLIC PANEL WITH GFR     Status: Abnormal   Collection Time: 06/16/22  8:15 AM  Result Value Ref Range   Glucose, Bld 106 (H) 65 - 99 mg/dL    Comment: .            Fasting reference interval . For someone without known diabetes, a glucose value between 100 and 125 mg/dL is consistent with prediabetes and should be confirmed with a follow-up test. .    BUN 14 7 - 25 mg/dL   Creat 0.74 0.50 - 1.03 mg/dL   eGFR 96 > OR = 60 mL/min/1.73m2   BUN/Creatinine Ratio SEE NOTE: 6 - 22 (calc)    Comment:    Not Reported: BUN and Creatinine are within    reference range. .    Sodium 143 135 - 146 mmol/L   Potassium 4.1 3.5 - 5.3 mmol/L   Chloride 105 98 - 110 mmol/L   CO2 25 20 - 32 mmol/L   Calcium 9.2 8.6 - 10.4 mg/dL   Total Protein 6.7 6.1 - 8.1 g/dL   Albumin 4.3 3.6 - 5.1 g/dL   Globulin 2.4 1.9 - 3.7 g/dL (calc)   AG Ratio 1.8 1.0 - 2.5 (calc)   Total Bilirubin 0.5 0.2 - 1.2 mg/dL   Alkaline phosphatase (APISO) 82 37 - 153 U/L   AST 22 10 - 35 U/L   ALT 27 6 - 29 U/L  CBC with Differential/Platelet     Status: None   Collection Time: 06/16/22  8:15 AM  Result Value Ref Range   WBC 7.4 3.8 - 10.8 Thousand/uL   RBC 4.53 3.80 - 5.10 Million/uL   Hemoglobin 14.5 11.7 - 15.5 g/dL   HCT 41.6 35.0 - 45.0 %   MCV 91.8 80.0 - 100.0 fL   MCH 32.0 27.0 - 33.0 pg   MCHC 34.9 32.0 - 36.0 g/dL   RDW 12.6 11.0 - 15.0 %   Platelets 391 140 - 400 Thousand/uL   MPV 8.8 7.5 - 12.5 fL   Neutro Abs 4,455 1,500 - 7,800 cells/uL   Lymphs Abs 2,213 850 - 3,900 cells/uL   Absolute Monocytes 377 200 - 950 cells/uL   Eosinophils Absolute 296 15 - 500 cells/uL   Basophils Absolute 59 0 - 200 cells/uL   Neutrophils Relative % 60.2 %   Total Lymphocyte 29.9 %    Monocytes Relative 5.1 %   Eosinophils Relative 4.0 %   Basophils Relative 0.8 %  Hemoglobin A1c     Status: Abnormal   Collection Time: 06/16/22  8:15 AM  Result Value Ref Range   Hgb A1c MFr Bld 5.8 (H) <5.7 % of total Hgb    Comment: For someone without known diabetes, a hemoglobin  A1c value between 5.7% and 6.4% is consistent with prediabetes and should be confirmed with a  follow-up test. . For someone with known diabetes, a value <7% indicates that their diabetes is well controlled. A1c targets should be individualized based on duration of diabetes, age, comorbid conditions, and other considerations. . This assay result is consistent with an increased risk of diabetes. . Currently, no consensus exists regarding use of hemoglobin A1c for diagnosis of diabetes for children. .    Mean Plasma Glucose 120 mg/dL   eAG (mmol/L) 6.6 mmol/L  Vitamin D (25 hydroxy)     Status: None   Collection Time: 06/16/22  8:15 AM  Result Value Ref Range   Vit D, 25-Hydroxy 55 30 -   100 ng/mL    Comment: Vitamin D Status         25-OH Vitamin D: . Deficiency:                    <20 ng/mL Insufficiency:             20 - 29 ng/mL Optimal:                 > or = 30 ng/mL . For 25-OH Vitamin D testing on patients on  D2-supplementation and patients for whom quantitation  of D2 and D3 fractions is required, the Quest[facility]ureD(TM) 25-OH VIT D, (D2,D3), LC/MS/MS is recommended: order  code [phone] (patients >21yr). . See Note 1 . Note 1 . For additional information, please refer to  http://education.[facility].com/faq/FAQ199  (This link is being provided for informational/ educational purposes only.)   Urine Microalbumin w/creat. ratio     Status: None   Collection Time: 06/16/22  8:15 AM  Result Value Ref Range   Creatinine, Urine 167 20 - 275 mg/dL   Microalb, Ur 4.3 mg/dL    Comment: Reference Range Not established    Microalb Creat Ratio 26 <30 mcg/mg creat    Comment:  . The ADA defines abnormalities in albumin excretion as follows: .Marland KitchenAlbuminuria Category        Result (mcg/mg creatinine) . Normal to Mildly increased   <30 Moderately increased         30-299  Severely increased           > OR = 300 . The ADA recommends that at least two of three specimens collected within a 3-6 month period be abnormal before considering a patient to be within a diagnostic category.           Mental Status Examination;   Psychiatric Specialty Exam: Physical Exam  Review of Systems  Cardiovascular:  Negative for chest pain.  Neurological:  Negative for tremors.  Psychiatric/Behavioral:  Negative for hallucinations.     Last menstrual period 12/17/2016.There is no height or weight on file to calculate BMI.  General Appearance:casual  Eye Contact::  fair  Speech:  Normal Rate  Volume:  Normal  Mood : better  Affect:  congruent  Thought Process:  Coherent  Orientation:  Full (Time, Place, and Person)  Thought Content:  Rumination  Suicidal Thoughts:  No  Homicidal Thoughts:  No  Memory:  Immediate;   Fair Recent;   Fair  Judgement:  Fair  Insight:  Shallow  Psychomotor Activity:  Normal  Concentration:  Fair  Recall:  Fair  Akathisia:  Negative  Handed:  Right  AIMS (if indicated):     Assets:  Communication Skills Desire for Improvement Financial Resources/Insurance Housing  Sleep:        Assessment: Axis I: mood disorder unspecified. Rule out mood disorder secondary to general medical condition. Major depressive disorder recurrent moderate. Rule out panic disorder. Generalized anxiety disorder   Axis III:  Past Medical History:  Diagnosis Date   Anxiety    Arthritis    back and hips    Depression    Diabetes mellitus [place] II, controlled ([place] 08/15/2014   GERD (gastroesophageal reflux disease)    HPV (human papilloma virus) infection    Hyperlipidemia    Lateral meniscus tear    left knee   Menopausal state    PONV  (postoperative nausea and vomiting)    slow to wake up    Pre-diabetes  Shortness of breath dyspnea    with exertion    Sleep apnea    cpap -0 setting at 16, uses CPAP nightly   Stress incontinence     Axis IV: psychosocial   Treatment Plan and Summary:  Prior documentation reviewed   Depression: depressed: improved, less moody, continue lamictal now 258m Also on celexa, viibryd,  Lyrica from pcp Anxiety :better, contiue celexa, also on xanax prn  Panic attacks : more stable continue celexa and prn Call for refills Can fu in 4102m     , NaMerian CapronMD 07/23/2022

## 2022-08-02 ENCOUNTER — Other Ambulatory Visit: Payer: Self-pay | Admitting: Sports Medicine

## 2022-08-02 DIAGNOSIS — M7918 Myalgia, other site: Secondary | ICD-10-CM

## 2022-08-04 ENCOUNTER — Other Ambulatory Visit (HOSPITAL_BASED_OUTPATIENT_CLINIC_OR_DEPARTMENT_OTHER): Payer: Self-pay

## 2022-08-04 MED ORDER — PREGABALIN 100 MG PO CAPS
100.0000 mg | ORAL_CAPSULE | Freq: Two times a day (BID) | ORAL | 1 refills | Status: DC
  Filled 2022-08-04: qty 180, fill #0
  Filled 2022-08-05: qty 180, 90d supply, fill #0
  Filled 2022-10-30 – 2022-11-12 (×3): qty 180, 90d supply, fill #1

## 2022-08-05 ENCOUNTER — Other Ambulatory Visit (HOSPITAL_COMMUNITY): Payer: Self-pay | Admitting: Psychiatry

## 2022-08-05 ENCOUNTER — Other Ambulatory Visit: Payer: Self-pay | Admitting: Physician Assistant

## 2022-08-05 DIAGNOSIS — E559 Vitamin D deficiency, unspecified: Secondary | ICD-10-CM

## 2022-08-06 ENCOUNTER — Other Ambulatory Visit (HOSPITAL_BASED_OUTPATIENT_CLINIC_OR_DEPARTMENT_OTHER): Payer: Self-pay

## 2022-08-06 MED ORDER — LAMOTRIGINE 25 MG PO TABS
50.0000 mg | ORAL_TABLET | Freq: Every day | ORAL | 0 refills | Status: DC
  Filled 2022-08-06: qty 60, 30d supply, fill #0

## 2022-08-06 MED ORDER — VITAMIN D (ERGOCALCIFEROL) 1.25 MG (50000 UNIT) PO CAPS
ORAL_CAPSULE | ORAL | 1 refills | Status: DC
  Filled 2022-08-06: qty 12, 84d supply, fill #0
  Filled 2022-10-30: qty 12, 84d supply, fill #1

## 2022-08-06 MED ORDER — ALPRAZOLAM 0.5 MG PO TABS
0.5000 mg | ORAL_TABLET | Freq: Every day | ORAL | 0 refills | Status: DC | PRN
  Filled 2022-08-06: qty 30, 30d supply, fill #0

## 2022-08-11 ENCOUNTER — Encounter: Payer: Self-pay | Admitting: Physician Assistant

## 2022-08-19 ENCOUNTER — Other Ambulatory Visit (HOSPITAL_BASED_OUTPATIENT_CLINIC_OR_DEPARTMENT_OTHER): Payer: Self-pay

## 2022-08-21 ENCOUNTER — Other Ambulatory Visit (HOSPITAL_BASED_OUTPATIENT_CLINIC_OR_DEPARTMENT_OTHER): Payer: Self-pay

## 2022-08-21 ENCOUNTER — Other Ambulatory Visit (HOSPITAL_COMMUNITY): Payer: Self-pay | Admitting: Psychiatry

## 2022-08-21 MED ORDER — VILAZODONE HCL 10 MG PO TABS
10.0000 mg | ORAL_TABLET | Freq: Every day | ORAL | 0 refills | Status: DC
  Filled 2022-08-21: qty 30, 30d supply, fill #0

## 2022-08-22 ENCOUNTER — Other Ambulatory Visit (HOSPITAL_BASED_OUTPATIENT_CLINIC_OR_DEPARTMENT_OTHER): Payer: Self-pay

## 2022-09-03 ENCOUNTER — Other Ambulatory Visit (HOSPITAL_COMMUNITY): Payer: Self-pay | Admitting: Psychiatry

## 2022-09-03 ENCOUNTER — Encounter: Payer: Self-pay | Admitting: Physician Assistant

## 2022-09-03 ENCOUNTER — Other Ambulatory Visit (HOSPITAL_BASED_OUTPATIENT_CLINIC_OR_DEPARTMENT_OTHER): Payer: Self-pay

## 2022-09-03 DIAGNOSIS — F331 Major depressive disorder, recurrent, moderate: Secondary | ICD-10-CM

## 2022-09-03 MED ORDER — CITALOPRAM HYDROBROMIDE 40 MG PO TABS
40.0000 mg | ORAL_TABLET | Freq: Every day | ORAL | 2 refills | Status: DC
  Filled 2022-09-03: qty 30, 30d supply, fill #0
  Filled 2022-10-01: qty 30, 30d supply, fill #1
  Filled 2022-11-02: qty 30, 30d supply, fill #2

## 2022-09-03 MED ORDER — LAMOTRIGINE 25 MG PO TABS
50.0000 mg | ORAL_TABLET | Freq: Every day | ORAL | 0 refills | Status: DC
  Filled 2022-09-03: qty 60, 30d supply, fill #0

## 2022-09-04 ENCOUNTER — Other Ambulatory Visit (HOSPITAL_BASED_OUTPATIENT_CLINIC_OR_DEPARTMENT_OTHER): Payer: Self-pay

## 2022-09-04 MED ORDER — FREESTYLE LITE TEST VI STRP
ORAL_STRIP | 0 refills | Status: AC
  Filled 2022-09-04: qty 100, 25d supply, fill #0

## 2022-09-04 MED ORDER — FREESTYLE LANCETS MISC
0 refills | Status: AC
  Filled 2022-09-04: qty 100, 25d supply, fill #0

## 2022-09-04 MED ORDER — FREESTYLE LITE W/DEVICE KIT
PACK | 0 refills | Status: AC
  Filled 2022-09-04: qty 1, 30d supply, fill #0

## 2022-09-05 ENCOUNTER — Other Ambulatory Visit (HOSPITAL_BASED_OUTPATIENT_CLINIC_OR_DEPARTMENT_OTHER): Payer: Self-pay

## 2022-09-08 ENCOUNTER — Other Ambulatory Visit (HOSPITAL_BASED_OUTPATIENT_CLINIC_OR_DEPARTMENT_OTHER): Payer: Self-pay

## 2022-09-09 ENCOUNTER — Other Ambulatory Visit (HOSPITAL_BASED_OUTPATIENT_CLINIC_OR_DEPARTMENT_OTHER): Payer: Self-pay

## 2022-09-09 ENCOUNTER — Other Ambulatory Visit (HOSPITAL_COMMUNITY): Payer: Self-pay | Admitting: Psychiatry

## 2022-09-09 MED ORDER — ALPRAZOLAM 0.5 MG PO TABS
0.5000 mg | ORAL_TABLET | Freq: Every day | ORAL | 0 refills | Status: DC | PRN
  Filled 2022-09-09: qty 30, 30d supply, fill #0

## 2022-09-10 DIAGNOSIS — G4733 Obstructive sleep apnea (adult) (pediatric): Secondary | ICD-10-CM | POA: Diagnosis not present

## 2022-09-16 ENCOUNTER — Other Ambulatory Visit (HOSPITAL_COMMUNITY): Payer: Self-pay | Admitting: Psychiatry

## 2022-09-17 ENCOUNTER — Other Ambulatory Visit (HOSPITAL_BASED_OUTPATIENT_CLINIC_OR_DEPARTMENT_OTHER): Payer: Self-pay

## 2022-09-17 ENCOUNTER — Other Ambulatory Visit (HOSPITAL_COMMUNITY): Payer: Self-pay | Admitting: Psychiatry

## 2022-09-17 MED ORDER — LAMOTRIGINE 200 MG PO TABS
200.0000 mg | ORAL_TABLET | Freq: Every day | ORAL | 2 refills | Status: DC
  Filled 2022-09-17: qty 30, 30d supply, fill #0
  Filled 2022-10-14: qty 30, 30d supply, fill #1
  Filled 2022-11-12 – 2022-11-13 (×2): qty 30, 30d supply, fill #2

## 2022-09-17 MED ORDER — VILAZODONE HCL 10 MG PO TABS
10.0000 mg | ORAL_TABLET | Freq: Every day | ORAL | 0 refills | Status: DC
  Filled 2022-09-17: qty 30, 30d supply, fill #0

## 2022-09-30 ENCOUNTER — Other Ambulatory Visit (HOSPITAL_COMMUNITY): Payer: Self-pay | Admitting: Psychiatry

## 2022-10-01 ENCOUNTER — Other Ambulatory Visit (HOSPITAL_BASED_OUTPATIENT_CLINIC_OR_DEPARTMENT_OTHER): Payer: Self-pay

## 2022-10-01 ENCOUNTER — Encounter: Payer: Self-pay | Admitting: Physician Assistant

## 2022-10-01 ENCOUNTER — Other Ambulatory Visit: Payer: Self-pay

## 2022-10-01 ENCOUNTER — Other Ambulatory Visit: Payer: Self-pay | Admitting: Physician Assistant

## 2022-10-01 DIAGNOSIS — E1121 Type 2 diabetes mellitus with diabetic nephropathy: Secondary | ICD-10-CM

## 2022-10-01 MED ORDER — LAMOTRIGINE 25 MG PO TABS
50.0000 mg | ORAL_TABLET | Freq: Every day | ORAL | 0 refills | Status: DC
  Filled 2022-10-01: qty 60, 30d supply, fill #0

## 2022-10-01 MED ORDER — OZEMPIC (2 MG/DOSE) 8 MG/3ML ~~LOC~~ SOPN
2.0000 mg | PEN_INJECTOR | SUBCUTANEOUS | 0 refills | Status: DC
  Filled 2022-10-01: qty 3, 28d supply, fill #0

## 2022-10-14 ENCOUNTER — Other Ambulatory Visit (HOSPITAL_COMMUNITY): Payer: Self-pay | Admitting: Psychiatry

## 2022-10-14 ENCOUNTER — Other Ambulatory Visit: Payer: Self-pay

## 2022-10-14 DIAGNOSIS — F411 Generalized anxiety disorder: Secondary | ICD-10-CM

## 2022-10-14 DIAGNOSIS — F410 Panic disorder [episodic paroxysmal anxiety] without agoraphobia: Secondary | ICD-10-CM

## 2022-10-15 ENCOUNTER — Other Ambulatory Visit (HOSPITAL_BASED_OUTPATIENT_CLINIC_OR_DEPARTMENT_OTHER): Payer: Self-pay

## 2022-10-15 DIAGNOSIS — F410 Panic disorder [episodic paroxysmal anxiety] without agoraphobia: Secondary | ICD-10-CM | POA: Insufficient documentation

## 2022-10-15 MED ORDER — ALPRAZOLAM 0.5 MG PO TABS
0.5000 mg | ORAL_TABLET | Freq: Every day | ORAL | 0 refills | Status: DC | PRN
  Filled 2022-10-15: qty 8, 8d supply, fill #0

## 2022-10-15 MED ORDER — VILAZODONE HCL 10 MG PO TABS
10.0000 mg | ORAL_TABLET | Freq: Every day | ORAL | 0 refills | Status: DC
  Filled 2022-10-15: qty 30, 30d supply, fill #0

## 2022-10-15 NOTE — Telephone Encounter (Signed)
I have sent Xanax 0.5 mg, limited supply to pharmacy as noted in patient's chart.  Will route this message to Dr. De Nurse to address once back in office.

## 2022-10-16 ENCOUNTER — Other Ambulatory Visit (HOSPITAL_BASED_OUTPATIENT_CLINIC_OR_DEPARTMENT_OTHER): Payer: Self-pay

## 2022-10-23 ENCOUNTER — Encounter (HOSPITAL_BASED_OUTPATIENT_CLINIC_OR_DEPARTMENT_OTHER): Payer: Self-pay | Admitting: Obstetrics and Gynecology

## 2022-10-23 ENCOUNTER — Other Ambulatory Visit: Payer: Self-pay

## 2022-10-23 ENCOUNTER — Other Ambulatory Visit (HOSPITAL_BASED_OUTPATIENT_CLINIC_OR_DEPARTMENT_OTHER): Payer: Self-pay

## 2022-10-23 ENCOUNTER — Telehealth (HOSPITAL_COMMUNITY): Payer: Self-pay | Admitting: *Deleted

## 2022-10-23 DIAGNOSIS — F410 Panic disorder [episodic paroxysmal anxiety] without agoraphobia: Secondary | ICD-10-CM

## 2022-10-23 DIAGNOSIS — F411 Generalized anxiety disorder: Secondary | ICD-10-CM

## 2022-10-23 MED ORDER — ALPRAZOLAM 0.5 MG PO TABS
0.5000 mg | ORAL_TABLET | Freq: Every day | ORAL | 0 refills | Status: DC | PRN
  Filled 2022-10-23: qty 30, 30d supply, fill #0

## 2022-10-23 NOTE — Progress Notes (Signed)
Spoke w/ via phone for pre-op interview--- pt Lab needs dos---- I-STAT, EKG          Lab results------  COVID test -----patient states asymptomatic no test needed Arrive at ------- 1245 NPO after MN NO Solid Food.  Clear liquids from MN until--- 1145 Med rec completed Medications to take morning of surgery -----  Diabetic medication ----- Last Ozempic dose was 10/22/2022. Patient advised not to take next dose until after procedure.  Patient instructed no nail polish to be worn day of surgery Patient instructed to bring photo id and insurance card day of surgery Patient aware to have Driver (ride ) / caregiver    for 24 hours after surgery Shannon [name] Patient Special Instructions ----- Bring CPAP and supplies DOS. Patient instructed that she can leave it in the car unless needed. Patient advised to call surgeon and ask when to stop Iron and Vitamin D.  Pre-Op special Istructions ----- Patient verbalized understanding of instructions that were given at this phone interview. Patient denies shortness of breath, chest pain, fever, cough at this phone interview.

## 2022-10-23 NOTE — Addendum Note (Signed)
Addended by: Merian Capron on: 10/23/2022 04:35 PM   Modules accepted: Orders

## 2022-10-23 NOTE — Telephone Encounter (Signed)
Patient called requested refill -- ALPRAZolam Duanne Moron) 0.5 MG tablet   [place]   Last seen -- 07/23/22 Next appt -- 11/26/22

## 2022-10-29 ENCOUNTER — Encounter (HOSPITAL_BASED_OUTPATIENT_CLINIC_OR_DEPARTMENT_OTHER)
Admission: RE | Disposition: A | Discharge status: Home / Self Care | Payer: Self-pay | Source: Home / Self Care | Attending: Obstetrics and Gynecology

## 2022-10-29 ENCOUNTER — Other Ambulatory Visit: Payer: Self-pay

## 2022-10-29 ENCOUNTER — Encounter (HOSPITAL_BASED_OUTPATIENT_CLINIC_OR_DEPARTMENT_OTHER): Payer: Self-pay | Admitting: Obstetrics and Gynecology

## 2022-10-29 ENCOUNTER — Ambulatory Visit (HOSPITAL_BASED_OUTPATIENT_CLINIC_OR_DEPARTMENT_OTHER)
Admission: RE | Admit: 2022-10-29 | Discharge: 2022-10-29 | Disposition: A | Discharge status: Home / Self Care | Payer: Commercial Managed Care - PPO | Attending: Obstetrics and Gynecology | Admitting: Obstetrics and Gynecology

## 2022-10-29 ENCOUNTER — Ambulatory Visit (HOSPITAL_BASED_OUTPATIENT_CLINIC_OR_DEPARTMENT_OTHER): Payer: Commercial Managed Care - PPO | Admitting: Anesthesiology

## 2022-10-29 ENCOUNTER — Other Ambulatory Visit (HOSPITAL_BASED_OUTPATIENT_CLINIC_OR_DEPARTMENT_OTHER): Payer: Self-pay

## 2022-10-29 DIAGNOSIS — E119 Type 2 diabetes mellitus without complications: Secondary | ICD-10-CM | POA: Insufficient documentation

## 2022-10-29 DIAGNOSIS — E785 Hyperlipidemia, unspecified: Secondary | ICD-10-CM | POA: Insufficient documentation

## 2022-10-29 DIAGNOSIS — G4730 Sleep apnea, unspecified: Secondary | ICD-10-CM | POA: Insufficient documentation

## 2022-10-29 DIAGNOSIS — E039 Hypothyroidism, unspecified: Secondary | ICD-10-CM | POA: Insufficient documentation

## 2022-10-29 DIAGNOSIS — D259 Leiomyoma of uterus, unspecified: Secondary | ICD-10-CM | POA: Diagnosis not present

## 2022-10-29 DIAGNOSIS — M1990 Unspecified osteoarthritis, unspecified site: Secondary | ICD-10-CM | POA: Diagnosis not present

## 2022-10-29 DIAGNOSIS — D250 Submucous leiomyoma of uterus: Secondary | ICD-10-CM

## 2022-10-29 DIAGNOSIS — Z79899 Other long term (current) drug therapy: Secondary | ICD-10-CM | POA: Insufficient documentation

## 2022-10-29 DIAGNOSIS — Z9884 Bariatric surgery status: Secondary | ICD-10-CM | POA: Insufficient documentation

## 2022-10-29 DIAGNOSIS — K219 Gastro-esophageal reflux disease without esophagitis: Secondary | ICD-10-CM | POA: Diagnosis not present

## 2022-10-29 DIAGNOSIS — N939 Abnormal uterine and vaginal bleeding, unspecified: Secondary | ICD-10-CM | POA: Diagnosis not present

## 2022-10-29 DIAGNOSIS — G709 Myoneural disorder, unspecified: Secondary | ICD-10-CM | POA: Diagnosis not present

## 2022-10-29 DIAGNOSIS — N924 Excessive bleeding in the premenopausal period: Secondary | ICD-10-CM | POA: Diagnosis not present

## 2022-10-29 DIAGNOSIS — N920 Excessive and frequent menstruation with regular cycle: Secondary | ICD-10-CM

## 2022-10-29 DIAGNOSIS — Z01818 Encounter for other preprocedural examination: Secondary | ICD-10-CM

## 2022-10-29 DIAGNOSIS — D219 Benign neoplasm of connective and other soft tissue, unspecified: Secondary | ICD-10-CM | POA: Diagnosis present

## 2022-10-29 HISTORY — PX: DILITATION & CURRETTAGE/HYSTROSCOPY WITH NOVASURE ABLATION: SHX5568

## 2022-10-29 HISTORY — DX: Attention-deficit hyperactivity disorder, unspecified type: F90.9

## 2022-10-29 LAB — POCT I-STAT, CHEM 8
BUN: 15 mg/dL (ref 6–20)
Calcium, Ion: 1.1 mmol/L — ABNORMAL LOW (ref 1.15–1.40)
Chloride: 105 mmol/L (ref 98–111)
Creatinine, Ser: 0.6 mg/dL (ref 0.44–1.00)
Glucose, Bld: 91 mg/dL (ref 70–99)
HCT: 44 % (ref 36.0–46.0)
Hemoglobin: 15 g/dL (ref 12.0–15.0)
Potassium: 3.3 mmol/L — ABNORMAL LOW (ref 3.5–5.1)
Sodium: 142 mmol/L (ref 135–145)
TCO2: 25 mmol/L (ref 22–32)

## 2022-10-29 LAB — POCT PREGNANCY, URINE: Preg Test, Ur: NEGATIVE

## 2022-10-29 SURGERY — DILATATION & CURETTAGE/HYSTEROSCOPY WITH NOVASURE ABLATION
Anesthesia: General | Site: Vagina

## 2022-10-29 MED ORDER — POVIDONE-IODINE 10 % EX SWAB: 2.0000 | Freq: Once | CUTANEOUS | Status: DC

## 2022-10-29 MED ORDER — KETOROLAC TROMETHAMINE 30 MG/ML IJ SOLN
INTRAMUSCULAR | Status: DC | PRN
  Administered 2022-10-29: 30 mg via INTRAVENOUS

## 2022-10-29 MED ORDER — FENTANYL CITRATE (PF) 100 MCG/2ML IJ SOLN
INTRAMUSCULAR | Status: DC | PRN
  Administered 2022-10-29: 25 ug via INTRAVENOUS
  Administered 2022-10-29: 50 ug via INTRAVENOUS
  Administered 2022-10-29: 25 ug via INTRAVENOUS

## 2022-10-29 MED ORDER — ACETAMINOPHEN 500 MG PO TABS
1000.0000 mg | ORAL_TABLET | ORAL | Status: AC
  Administered 2022-10-29: 1000 mg via ORAL

## 2022-10-29 MED ORDER — ACETAMINOPHEN 500 MG PO TABS
ORAL_TABLET | ORAL | Status: AC
  Filled 2022-10-29: qty 2

## 2022-10-29 MED ORDER — FENTANYL CITRATE (PF) 100 MCG/2ML IJ SOLN
INTRAMUSCULAR | Status: AC
  Filled 2022-10-29: qty 2

## 2022-10-29 MED ORDER — ONDANSETRON HCL 4 MG/2ML IJ SOLN
INTRAMUSCULAR | Status: AC
  Filled 2022-10-29: qty 2

## 2022-10-29 MED ORDER — IBUPROFEN 800 MG PO TABS
800.0000 mg | ORAL_TABLET | Freq: Three times a day (TID) | ORAL | 0 refills | Status: AC | PRN
  Filled 2022-10-29: qty 30, 10d supply, fill #0

## 2022-10-29 MED ORDER — KETOROLAC TROMETHAMINE 15 MG/ML IJ SOLN: 15.0000 mg | INTRAMUSCULAR | Status: DC

## 2022-10-29 MED ORDER — OXYCODONE HCL 5 MG PO TABS
5.0000 mg | ORAL_TABLET | ORAL | 0 refills | Status: DC | PRN
  Filled 2022-10-29: qty 6, 1d supply, fill #0

## 2022-10-29 MED ORDER — MIDAZOLAM HCL 5 MG/5ML IJ SOLN
INTRAMUSCULAR | Status: DC | PRN
  Administered 2022-10-29: 2 mg via INTRAVENOUS

## 2022-10-29 MED ORDER — SODIUM CHLORIDE 0.9 % IR SOLN
Status: DC | PRN
  Administered 2022-10-29: 3000 mL

## 2022-10-29 MED ORDER — LIDOCAINE HCL (PF) 2 % IJ SOLN
INTRAMUSCULAR | Status: AC
  Filled 2022-10-29: qty 5

## 2022-10-29 MED ORDER — KETOROLAC TROMETHAMINE 30 MG/ML IJ SOLN
INTRAMUSCULAR | Status: AC
  Filled 2022-10-29: qty 1

## 2022-10-29 MED ORDER — LACTATED RINGERS IV SOLN: INTRAVENOUS | Status: DC

## 2022-10-29 MED ORDER — LIDOCAINE HCL (CARDIAC) PF 100 MG/5ML IV SOSY
PREFILLED_SYRINGE | INTRAVENOUS | Status: DC | PRN
  Administered 2022-10-29: 100 mg via INTRAVENOUS

## 2022-10-29 MED ORDER — ONDANSETRON HCL 4 MG/2ML IJ SOLN
INTRAMUSCULAR | Status: DC | PRN
  Administered 2022-10-29: 4 mg via INTRAVENOUS

## 2022-10-29 MED ORDER — SCOPOLAMINE 1 MG/3DAYS TD PT72: 1.0000 | MEDICATED_PATCH | TRANSDERMAL | Status: DC

## 2022-10-29 MED ORDER — ACETAMINOPHEN 500 MG PO TABS
500.0000 mg | ORAL_TABLET | Freq: Four times a day (QID) | ORAL | 0 refills | Status: DC | PRN
  Filled 2022-10-29: qty 100, 25d supply, fill #0

## 2022-10-29 MED ORDER — MIDAZOLAM HCL 2 MG/2ML IJ SOLN
INTRAMUSCULAR | Status: AC
  Filled 2022-10-29: qty 2

## 2022-10-29 MED ORDER — DEXAMETHASONE SODIUM PHOSPHATE 4 MG/ML IJ SOLN
INTRAMUSCULAR | Status: DC | PRN
  Administered 2022-10-29: 5 mg via INTRAVENOUS

## 2022-10-29 MED ORDER — DEXAMETHASONE SODIUM PHOSPHATE 10 MG/ML IJ SOLN
INTRAMUSCULAR | Status: AC
  Filled 2022-10-29: qty 1

## 2022-10-29 MED ORDER — PROPOFOL 10 MG/ML IV BOLUS
INTRAVENOUS | Status: DC | PRN
  Administered 2022-10-29: 200 mg via INTRAVENOUS

## 2022-10-29 SURGICAL SUPPLY — 14 items
ABLATOR SURESOUND NOVASURE (ABLATOR) ×1 IMPLANT
CATH ROBINSON RED A/P 16FR (CATHETERS) IMPLANT
DILATOR CANAL MILEX (MISCELLANEOUS) IMPLANT
ELECT REM PT RETURN 9FT ADLT (ELECTROSURGICAL)
ELECTRODE REM PT RTRN 9FT ADLT (ELECTROSURGICAL) IMPLANT
GLOVE BIO SURGEON STRL SZ 6 (GLOVE) ×1 IMPLANT
GOWN STRL REUS W/TWL LRG LVL3 (GOWN DISPOSABLE) ×1 IMPLANT
KIT PROCEDURE FLUENT (KITS) ×1 IMPLANT
MYOSURE XL FIBROID (MISCELLANEOUS) ×1
PACK VAGINAL MINOR WOMEN LF (CUSTOM PROCEDURE TRAY) ×1 IMPLANT
PAD OB MATERNITY 4.3X12.25 (PERSONAL CARE ITEMS) ×1 IMPLANT
SEAL ROD LENS SCOPE MYOSURE (ABLATOR) ×1 IMPLANT
SYSTEM TISS REMOVAL MYOSURE XL (MISCELLANEOUS) IMPLANT
TOWEL OR 17X26 10 PK STRL BLUE (TOWEL DISPOSABLE) ×1 IMPLANT

## 2022-10-29 NOTE — Discharge Instructions (Signed)
No acetaminophen/Tylenol until after 6:30 pm today if needed.   No ibuprofen, Advil, Aleve, Motrin, ketorolac, meloxicam, naproxen, or other NSAIDS until after 10:15 pm today if needed.     Post Anesthesia Home Care Instructions  Activity: Get plenty of rest for the remainder of the day. A responsible individual must stay with you for 24 hours following the procedure.  For the next 24 hours, DO NOT: -Drive a car -Paediatric nurse -Drink alcoholic beverages -Take any medication unless instructed by your physician -Make any legal decisions or sign important papers.  Meals: Start with liquid foods such as gelatin or soup. Progress to regular foods as tolerated. Avoid greasy, spicy, heavy foods. If nausea and/or vomiting occur, drink only clear liquids until the nausea and/or vomiting subsides. Call your physician if vomiting continues.  Special Instructions/Symptoms: Your throat may feel dry or sore from the anesthesia or the breathing tube placed in your throat during surgery. If this causes discomfort, gargle with warm salt water. The discomfort should disappear within 24 hours.  If you had a scopolamine patch placed behind your ear for the management of post- operative nausea and/or vomiting:  1. The medication in the patch is effective for 72 hours, after which it should be removed.  Wrap patch in a tissue and discard in the trash. Wash hands thoroughly with soap and water. 2. You may remove the patch earlier than 72 hours if you experience unpleasant side effects which may include dry mouth, dizziness or visual disturbances. 3. Avoid touching the patch. Wash your hands with soap and water after contact with the patch.

## 2022-10-29 NOTE — Anesthesia Preprocedure Evaluation (Addendum)
Anesthesia Evaluation  Patient identified by MRN, date of birth, ID band Patient awake    Reviewed: Allergy & Precautions, NPO status , Patient's Chart, lab work & pertinent test results  History of Anesthesia Complications (+) PONV and history of anesthetic complications  Airway Mallampati: III  TM Distance: >3 FB Neck ROM: Full    Dental  (+) Dental Advisory Given   Pulmonary sleep apnea and Continuous Positive Airway Pressure Ventilation    breath sounds clear to auscultation       Cardiovascular negative cardio ROS  Rhythm:Regular Rate:Normal     Neuro/Psych  Neuromuscular disease    GI/Hepatic Neg liver ROS, hiatal hernia,GERD  ,,  Endo/Other  diabetes, Type 2Hypothyroidism    Renal/GU Renal disease     Musculoskeletal  (+) Arthritis ,    Abdominal   Peds  Hematology negative hematology ROS (+)   Anesthesia Other Findings   Reproductive/Obstetrics                             Anesthesia Physical Anesthesia Plan  ASA: 2  Anesthesia Plan: General   Post-op Pain Management: Tylenol PO (pre-op)* and Toradol IV (intra-op)*   Induction:   PONV Risk Score and Plan: 4 or greater and Scopolamine patch - Pre-op, Midazolam, Dexamethasone, Ondansetron and Treatment may vary due to age or medical condition  Airway Management Planned: LMA  Additional Equipment:   Intra-op Plan:   Post-operative Plan: Extubation in OR  Informed Consent: I have reviewed the patients History and Physical, chart, labs and discussed the procedure including the risks, benefits and alternatives for the proposed anesthesia with the patient or authorized representative who has indicated his/her understanding and acceptance.     Dental advisory given  Plan Discussed with: CRNA  Anesthesia Plan Comments:        Anesthesia Quick Evaluation

## 2022-10-29 NOTE — Transfer of Care (Signed)
Immediate Anesthesia Transfer of Care Note  Patient: Shannon [name]  Procedure(s) Performed: DILATATION & CURETTAGE/HYSTEROSCOPY WITH MYOSURE AND NOVASURE ABLATION (Vagina )  Patient Location: PACU  Anesthesia Type:General  Level of Consciousness: awake, alert , oriented, and patient cooperative  Airway & Oxygen Therapy: Patient Spontanous Breathing and Patient connected to nasal cannula oxygen  Post-op Assessment: Report given to RN and Post -op Vital signs reviewed and stable  Post vital signs: Reviewed and stable  Last Vitals:  Vitals Value Taken Time  BP    Temp    Pulse    Resp    SpO2      Last Pain:  Vitals:   10/29/22 1210  TempSrc: Oral  PainSc: 3       Patients Stated Pain Goal: 5 (41/36/43 8377)  Complications: No notable events documented.

## 2022-10-29 NOTE — Op Note (Signed)
Catalaya D Yi PROCEDURE DATE: 10/29/2022   PREOPERATIVE DIAGNOSIS:  Menorrhagia in the perimenopausal setting, submucosal fibroid  POSTOPERATIVE DIAGNOSIS:  Same  PROCEDURE:  D&C, hysteroscopy, myosure resection of fibroid and novasure endometrial ablation  SURGEON:  Dr. Radene Gunning  ASSISTANT:  None  ANESTHESIA:  LMA  COMPLICATIONS:  None immediate.  ESTIMATED BLOOD LOSS:  5 ml.  FLUIDS: 800 ml LR.  URINE OUTPUT:  1 cc (for UPT, which was negative)  SPECIMEN: 1. [place] 2. Fibroid  INDICATIONS: 57 y.o. [id number] with heavy menstrual bleeding in the perimenopausal bleeding with a known submucosal uterine fibroid.     FINDINGS:  NEFG, normal appearing cervix. Normal atrophic endometrial cavity with 2-3 cm submucosal fibroid. [place] visualized bilaterally. Smooth cavity and homogenous char noted after procedure. Fluid deficit was 60.  TECHNIQUE:  The patient was taken to the operating room where LMA anesthesia was obtained without difficulty.  She was then placed in the dorsal lithotomy position and prepared and draped in sterile fashion.  After an adequate timeout was performed, a bivalved speculum was then placed in the patient's vagina, and the anterior lip of cervix grasped with the single-tooth tenaculum.  The cervix was dilated with Kennon Rounds dilators. Hysteroscope inserted and cavity had aforementioned findings. The myosure XL was used to resect the fibroid with ease until the cavity was smooth and flush with the surrounding anatomy. I then performed a curettage for the [facility].    Uterine sound was 9 cm. Cervical length was 4 cm giving cavity length of 5. Novasure device inserted and cavity width found to be 4.4cm. Cavity assessment done and assured. Device activated. Power was 121W and length of ablation was 49s. Device removed once completed and hysteroscope inserted and homogenous char noted. All instruments removed and procedure was ended.   The patient will be discharged to home as per  PACU criteria.  Routine postoperative instructions given.  Radene Gunning, MD Attending [place], [facility] for [facility], [place]

## 2022-10-29 NOTE — H&P (Signed)
Faculty Practice Obstetrics and Gynecology Attending History and Physical  Siena D Virginia is a 57 y.o. G1P1001 here today for surgery for her AUB.    She had her IUD removed in May and was switched to a different HRT. She had persistent diarrhea so we discussed possible discontinuation but if it persisted then to see PCP.  Since we talked about it she did stop it and she had a return of the diarrhea. She has not been on HRT and has not had issues with hot flashes.     Since we last talked, she had a period - it was 9 days and for 5 of those days she has had heavy bleeding. She passed golf ball size clots and her periods were painful.    Additionally, she had a follow up US for a right ovarian cyst which was 2.9 cm and mildly complex and a thickened EL. She had had an EMB at the time of her IUD removal but it was insufficient amount of tissue. This was of less concern due to her having an IUD in place prior to the EMB. She had a repeat US and her EL measured 8 mm by my review which was reassuring. Her ovarian cyst went from 2.9 cm to 2.4 cm.   Since her last period in September she has not had any more bleeding. She has not had s/sx of menopause.   Past Medical History:  Diagnosis Date   ADHD (attention deficit hyperactivity disorder)    Anxiety    Arthritis    back and hips    Depression    Diabetes mellitus type II, controlled ([place]) 08/15/2014   GERD (gastroesophageal reflux disease)    HPV (human papilloma virus) infection    Hyperlipidemia    Lateral meniscus tear    left knee   Menopausal state    PONV (postoperative nausea and vomiting)    slow to wake up    Pre-diabetes    Shortness of breath dyspnea    with exertion    Sleep apnea    cpap -0 setting at 16, uses CPAP nightly   Stress incontinence    Past Surgical History:  Procedure Laterality Date   APPENDECTOMY     BREATH TEK H PYLORI N/A 04/03/2015   Procedure: BREATH TEK H PYLORI;  Surgeon: Greer Pickerel, MD;  Location: [facility];  Service: General;  Laterality: N/A;   CESAREAN [place] Right 04/04/1999   second interspace, right foot   EXCISION [facility]'S NEUROMA Left 04/04/1999   second interspace, left foot   feet surgery     KNEE ARTHROSCOPY Right 03/17/2019   Procedure: RIGHT KNEE ARTHROSCOPY WITH PARTIAL LATERAL MENISCECTOMY, REMOVAL OF LOOSE BODIES, 3 COMPARTMENT CHONDROPLASTY;  Surgeon: Mcarthur Rossetti, MD;  Location: [facility];  Service: Orthopedics;  Laterality: Right;   LAPAROSCOPIC GASTRIC SLEEVE RESECTION WITH HIATAL HERNIA REPAIR N/A 08/13/2015   Procedure: LAPAROSCOPIC GASTRIC SLEEVE RESECTION WITH HIATAL HERNIA REPAIR;  Surgeon: Greer Pickerel, MD;  Location: [facility];  Service: General;  Laterality: N/A;   laproscopy     UPPER GI ENDOSCOPY  08/13/2015   Procedure: UPPER GI ENDOSCOPY;  Surgeon: Greer Pickerel, MD;  Location: [facility];  Service: General;;   WISDOM TOOTH EXTRACTION     OB History  Gravida Para Term Preterm AB Living  '1 1 1     1  '$ SAB IAB Ectopic Multiple Live Births               #  Outcome Date GA Lbr Len/2nd Weight Sex Delivery Anes PTL Lv  1 Term           Patient denies any other pertinent gynecologic issues.  No current facility-administered medications on file prior to encounter.   Current Outpatient Medications on File Prior to Encounter  Medication Sig Dispense Refill   ferrous sulfate 325 (65 FE) MG tablet Take 325 mg by mouth daily with breakfast.     mirabegron ER (MYRBETRIQ) 50 MG TB24 tablet Take 1 tablet (50 mg total) by mouth daily. 90 tablet 3   pregabalin (LYRICA) 100 MG capsule Take 1 capsule (100 mg total) by mouth 2 (two) times daily. 180 capsule 1   Vitamin D, Ergocalciferol, (DRISDOL) 1.25 MG (50000 UNIT) CAPS capsule TAKE 1 CAPSULE (50,000 UNITS TOTAL) BY MOUTH EVERY 7 (SEVEN) DAYS. 12 capsule 1   Allergies  Allergen Reactions   Food Swelling    Big Red Gum makes her tongue swell   Latuda [Lurasidone  Hcl] Other (See Comments)    Severe aggitation   Lipitor [Atorvastatin] Other (See Comments)    Muscle aches   Atorvastatin Calcium    Belviq [Lorcaserin Hcl]     Increased appetitie   Contrave [Naltrexone-Bupropion Hcl Er] Other (See Comments)    Sleepy.    Social History:   reports that she has never smoked. She has never used smokeless tobacco. She reports that she does not drink alcohol and does not use drugs. Family History  Problem Relation Age of Onset   Hyperlipidemia Mother    Sleep apnea Mother    Depression Mother    Kidney disease Mother    Diabetes Father    Hypertension Father    COPD Father    Depression Sister    Alcohol abuse Paternal Grandfather    Diabetes Paternal Aunt    Diabetes Paternal Uncle    Alcohol abuse Paternal Uncle    Leukemia Paternal Uncle     Review of Systems: Pertinent items noted in HPI and remainder of comprehensive ROS otherwise negative.  PHYSICAL EXAM: Blood pressure 137/86, pulse 74, temperature 98.2 F (36.8 C), temperature source Oral, resp. rate 16, height '5\' 5"'$  (1.651 m), weight 85.4 kg, last menstrual period 12/17/2016, SpO2 96 %. CONSTITUTIONAL: Well-developed, well-nourished female in no acute distress.  HENT:  Normocephalic, atraumatic, External right and left ear normal. Oropharynx is clear and moist EYES: Conjunctivae and EOM are normal. Pupils are equal, round, and reactive to light. No scleral icterus.  NECK: Normal range of motion, supple, no masses SKIN: Skin is warm and dry. No rash noted. Not diaphoretic. No erythema. No pallor. NEUROLOGIC: Alert and oriented to person, place, and time. Normal reflexes, muscle tone coordination. No cranial nerve deficit noted. PSYCHIATRIC: Normal mood and affect. Normal behavior. Normal judgment and thought content. CARDIOVASCULAR: Normal heart rate noted, regular rhythm RESPIRATORY: Effort and breath sounds normal, no problems with respiration noted ABDOMEN: Soft, nontender,  nondistended. PELVIC: Deferred MUSCULOSKELETAL: Normal range of motion. No tenderness.  No cyanosis, clubbing, or edema.  2+ distal pulses.  Labs: Results for orders placed or performed during the hospital encounter of 10/29/22 (from the past 336 hour(s))  I-STAT, chem 8   Collection Time: 10/29/22 12:42 PM  Result Value Ref Range   Sodium 142 135 - 145 mmol/L   Potassium 3.3 (L) 3.5 - 5.1 mmol/L   Chloride 105 98 - 111 mmol/L   BUN 15 6 - 20 mg/dL   Creatinine, Ser 0.60 0.44 - 1.00  mg/dL   Glucose, Bld 91 70 - 99 mg/dL   Calcium, Ion 1.10 (L) 1.15 - 1.40 mmol/L   TCO2 25 22 - 32 mmol/L   Hemoglobin 15.0 12.0 - 15.0 g/dL   HCT 44.0 36.0 - 46.0 %    Imaging Studies: No results found.  Assessment: Active Problems:   Fibroids   Abnormal uterine bleeding (AUB)   Plan: - Uterine fibroids: The patient's fibroids are symptomatic (bleeding) and treatment options of expectant management, medical therapy, and surgical therapy were discussed. - We previously discussed all options.  - Following counseling, the patient would like to  do TXA and then ultimately hysteroscopic resection of the submucosal fibroid and then ablation to follow.  - We discussed risks of the surgery: bleeding, infection, injury to surrounding organs/tissues. We discussed the possibility that the fibroid may not be visible on hysteroscopy. We reviewed that in that event that the ablation will still help any potential future bleeding. Reviewed ablation is best in setting of bridge to menopause which she falls under.  - Reviewed typical recovery and restrictions following procedure. Consent reviewed and signed after all questions were answered.  Her aunt is here with her today as her support person.    Radene Gunning, MD, [place] for [facility], [place]

## 2022-10-29 NOTE — Anesthesia Procedure Notes (Signed)
Procedure Name: LMA Insertion Date/Time: 10/29/2022 3:39 PM  Performed by: Justice Rocher, CRNAPre-anesthesia Checklist: Patient identified, Emergency Drugs available, Suction available and Patient being monitored Patient Re-evaluated:Patient Re-evaluated prior to induction Oxygen Delivery Method: Circle System Utilized Preoxygenation: Pre-oxygenation with 100% oxygen Induction Type: IV induction Ventilation: Mask ventilation without difficulty LMA: LMA inserted LMA Size: 4.0 Number of attempts: 1 Placement Confirmation: positive ETCO2 Tube secured with: Tape Dental Injury: Teeth and Oropharynx as per pre-operative assessment

## 2022-10-29 NOTE — Anesthesia Postprocedure Evaluation (Signed)
Anesthesia Post Note  Patient: Erielle D Scali  Procedure(s) Performed: DILATATION & CURETTAGE/HYSTEROSCOPY WITH MYOSURE AND NOVASURE ABLATION (Vagina )     Patient location during evaluation: PACU Anesthesia Type: General Level of consciousness: awake and alert Pain management: pain level controlled Vital Signs Assessment: post-procedure vital signs reviewed and stable Respiratory status: spontaneous breathing, nonlabored ventilation, respiratory function stable and patient connected to nasal cannula oxygen Cardiovascular status: blood pressure returned to baseline and stable Postop Assessment: no apparent nausea or vomiting Anesthetic complications: no  No notable events documented.  Last Vitals:  Vitals:   10/29/22 1210  BP: 137/86  Pulse: 74  Resp: 16  Temp: 36.8 C  SpO2: 96%    Last Pain:  Vitals:   10/29/22 1210  TempSrc: Oral  PainSc: 3                  Barnet Glasgow

## 2022-10-30 ENCOUNTER — Encounter (HOSPITAL_BASED_OUTPATIENT_CLINIC_OR_DEPARTMENT_OTHER): Payer: Self-pay | Admitting: Obstetrics and Gynecology

## 2022-10-30 ENCOUNTER — Other Ambulatory Visit: Payer: Self-pay

## 2022-10-31 LAB — SURGICAL PATHOLOGY

## 2022-11-02 ENCOUNTER — Other Ambulatory Visit (HOSPITAL_COMMUNITY): Payer: Self-pay | Admitting: Psychiatry

## 2022-11-03 ENCOUNTER — Other Ambulatory Visit (HOSPITAL_COMMUNITY): Payer: Self-pay

## 2022-11-03 ENCOUNTER — Other Ambulatory Visit: Payer: Self-pay

## 2022-11-03 MED ORDER — LAMOTRIGINE 25 MG PO TABS
50.0000 mg | ORAL_TABLET | Freq: Every day | ORAL | 0 refills | Status: DC
  Filled 2022-11-03 – 2022-11-12 (×2): qty 60, 30d supply, fill #0

## 2022-11-04 ENCOUNTER — Other Ambulatory Visit (HOSPITAL_COMMUNITY): Payer: Self-pay

## 2022-11-04 ENCOUNTER — Encounter: Payer: Self-pay | Admitting: Pharmacist

## 2022-11-04 ENCOUNTER — Other Ambulatory Visit: Payer: Self-pay

## 2022-11-05 ENCOUNTER — Other Ambulatory Visit (HOSPITAL_COMMUNITY): Payer: Self-pay

## 2022-11-05 ENCOUNTER — Other Ambulatory Visit: Payer: Self-pay

## 2022-11-05 LAB — HM DIABETES EYE EXAM

## 2022-11-06 ENCOUNTER — Other Ambulatory Visit: Payer: Self-pay | Admitting: Physician Assistant

## 2022-11-06 ENCOUNTER — Other Ambulatory Visit (HOSPITAL_COMMUNITY): Payer: Self-pay

## 2022-11-06 DIAGNOSIS — E1121 Type 2 diabetes mellitus with diabetic nephropathy: Secondary | ICD-10-CM

## 2022-11-13 ENCOUNTER — Other Ambulatory Visit: Payer: Self-pay

## 2022-11-13 ENCOUNTER — Other Ambulatory Visit (HOSPITAL_BASED_OUTPATIENT_CLINIC_OR_DEPARTMENT_OTHER): Payer: Self-pay

## 2022-11-14 ENCOUNTER — Other Ambulatory Visit (HOSPITAL_BASED_OUTPATIENT_CLINIC_OR_DEPARTMENT_OTHER): Payer: Self-pay

## 2022-11-17 ENCOUNTER — Ambulatory Visit: Payer: Commercial Managed Care - PPO | Admitting: Physician Assistant

## 2022-11-17 ENCOUNTER — Encounter: Payer: Self-pay | Admitting: Physician Assistant

## 2022-11-17 ENCOUNTER — Other Ambulatory Visit (HOSPITAL_BASED_OUTPATIENT_CLINIC_OR_DEPARTMENT_OTHER): Payer: Self-pay

## 2022-11-17 VITALS — BP 126/79 | HR 69 | Ht 65.0 in | Wt 184.0 lb

## 2022-11-17 DIAGNOSIS — F331 Major depressive disorder, recurrent, moderate: Secondary | ICD-10-CM | POA: Diagnosis not present

## 2022-11-17 DIAGNOSIS — E6609 Other obesity due to excess calories: Secondary | ICD-10-CM

## 2022-11-17 DIAGNOSIS — E1121 Type 2 diabetes mellitus with diabetic nephropathy: Secondary | ICD-10-CM

## 2022-11-17 DIAGNOSIS — Z6830 Body mass index (BMI) 30.0-30.9, adult: Secondary | ICD-10-CM | POA: Diagnosis not present

## 2022-11-17 DIAGNOSIS — E785 Hyperlipidemia, unspecified: Secondary | ICD-10-CM | POA: Diagnosis not present

## 2022-11-17 LAB — POCT GLYCOSYLATED HEMOGLOBIN (HGB A1C): Hemoglobin A1C: 5.7 % — AB (ref 4.0–5.6)

## 2022-11-17 MED ORDER — ROSUVASTATIN CALCIUM 10 MG PO TABS
10.0000 mg | ORAL_TABLET | Freq: Every day | ORAL | 3 refills | Status: DC
  Filled 2022-11-17: qty 90, 90d supply, fill #0

## 2022-11-17 MED ORDER — OZEMPIC (2 MG/DOSE) 8 MG/3ML ~~LOC~~ SOPN
2.0000 mg | PEN_INJECTOR | SUBCUTANEOUS | 0 refills | Status: DC
  Filled 2022-11-17: qty 9, 84d supply, fill #0

## 2022-11-17 NOTE — Progress Notes (Signed)
Established Patient Office Visit  Subjective   Patient ID: Shannon [name], female    DOB: Jan 05, 1966  Age: 57 y.o. MRN: [id number]  Chief Complaint  Patient presents with   Follow-up   Diabetes    HPI Pt is a 57 yo female with T2DM, HLD, MDD, GAD who presents to the clinic for follow up.   She does not check her sugars. She is on ozempic weekly. No open sores or wounds. Denies any CP, palpitations, headaches or vision changes. Eye exam 2 weeks ago. No hypoglycemic events. She is very active at work but no regular exercise.    Pt had D and C on 10/29/2022 and doing great.   .. 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   Cervical myofascial pain syndrome 07/31/2016   Fibroids 10/30/2016   BPPV (benign paroxysmal positional vertigo), right 05/09/2018   Primary osteoarthritis of right knee 10/06/2018   Loss of balance 05/24/2019   Vertigo 05/24/2019   OAB (overactive bladder) 11/23/2019   Fatigue 08/13/2020   Family history of leukemia 08/14/2020   SOB (shortness of breath) on exertion 08/14/2020   Bilateral lower extremity edema 08/14/2020   Elevated platelet count 08/14/2020   Digital mucinous cyst of right thumb 10/23/2020   Vitamin D deficiency 11/27/2020   Unilateral primary osteoarthritis, right knee 02/06/2021   Hematuria 06/18/2021   Menopausal symptoms 10/15/2021   Functional diarrhea 06/04/2022   Panic disorder 10/15/2022   Abnormal  uterine bleeding (AUB) 10/29/2022   Major depressive disorder, recurrent episode, moderate ([place]) 11/17/2022   Resolved Ambulatory Problems    Diagnosis Date Noted   Unspecified otitis media 11/22/2008   Acute sinusitis, unspecified 11/22/2008   URI 09/28/2009   BACK PAIN, LUMBAR 12/26/2008   Abnormal weight gain 03/31/2014   Burning sensation of the foot 08/28/2014   Numbness of both lower extremities 08/28/2014   BMI 38.0-38.9,adult 12/29/2014   Status post gastric surgery 11/12/2015   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   Iron deficiency anemia secondary to inadequate dietary iron intake 03/25/2018   Chest pain 05/09/2018   Polyp of colon 05/21/2018   Acute lateral meniscus tear of right knee 03/17/2019   Fever 08/13/2020   Abnormal urine odor 06/19/2021   Past Medical History:  Diagnosis Date   ADHD (attention deficit hyperactivity disorder)    Anxiety    Arthritis    Depression    Diabetes mellitus type II, controlled ([place]) 08/15/2014   GERD (gastroesophageal reflux disease)    HPV (human papilloma virus) infection    Hyperlipidemia    Lateral meniscus tear    Menopausal state    PONV (postoperative nausea and vomiting)    Pre-diabetes    Shortness of breath dyspnea    Stress incontinence      ROS See HPI.    Objective:     BP 126/79   Pulse 69   Ht '5\' 5"'$  (1.651 m)   Wt 184 lb (83.5 kg)  LMP 12/17/2016 (Exact Date)   SpO2 98%   BMI 30.62 kg/m  BP Readings from Last 3 Encounters:  11/17/22 126/79  10/29/22 123/68  06/26/22 (!) 148/82   Wt Readings from Last 3 Encounters:  11/17/22 184 lb (83.5 kg)  10/29/22 188 lb 4.8 oz (85.4 kg)  06/26/22 219 lb (99.3 kg)      Physical Exam Vitals reviewed.  Constitutional:      Appearance: Normal appearance. She is obese.  Cardiovascular:     Rate and Rhythm: Normal rate and regular rhythm.  Pulmonary:     Effort: Pulmonary effort is normal.     Breath sounds: Normal breath sounds.  Musculoskeletal:     Right  lower leg: No edema.     Left lower leg: No edema.  Neurological:     General: No focal deficit present.     Mental Status: She is alert and oriented to person, place, and time.  Psychiatric:        Mood and Affect: Mood normal.      Results for orders placed or performed in visit on 11/17/22  POCT glycosylated hemoglobin (Hb A1C)  Result Value Ref Range   Hemoglobin A1C 5.7 (A) 4.0 - 5.6 %   HbA1c POC (<> result, manual entry)     HbA1c, POC (prediabetic range)     HbA1c, POC (controlled diabetic range)       The 10-year ASCVD risk score (Arnett DK, et al., 2019) is: 4.5%    Assessment & Plan:  Marland KitchenMarland KitchenAmy was seen today for follow-up and diabetes.  Diagnoses and all orders for this visit:  Type II diabetes mellitus with nephropathy ([place]) -     POCT glycosylated hemoglobin (Hb A1C) -     Semaglutide, 2 MG/DOSE, (OZEMPIC, 2 MG/DOSE,) 8 MG/3ML SOPN; Inject 2 mg into the skin once a week.  Major depressive disorder, recurrent episode, moderate (HCC)  Hyperlipidemia LDL goal <70 -     rosuvastatin (CRESTOR) 10 MG tablet; Take 1 tablet (10 mg total) by mouth daily.   A1C is 5.7 and to goal Continue same medications Refilled ozempic today BP to goal on second recheck Not on statin due to lipitor SE's sent crestor to try once a week to daily as tolerated Eye exam UTD will get copy at eye care center Foot exam UTD Pneumonia/covid/flu UTD Follow up in 3 months    Return in about 3 months (around 02/16/2023).    Iran Planas, PA-C

## 2022-11-19 ENCOUNTER — Other Ambulatory Visit (HOSPITAL_COMMUNITY): Payer: Self-pay | Admitting: Psychiatry

## 2022-11-19 DIAGNOSIS — F411 Generalized anxiety disorder: Secondary | ICD-10-CM

## 2022-11-19 DIAGNOSIS — F410 Panic disorder [episodic paroxysmal anxiety] without agoraphobia: Secondary | ICD-10-CM

## 2022-11-20 ENCOUNTER — Other Ambulatory Visit (HOSPITAL_BASED_OUTPATIENT_CLINIC_OR_DEPARTMENT_OTHER): Payer: Self-pay

## 2022-11-20 ENCOUNTER — Other Ambulatory Visit: Payer: Self-pay | Admitting: Sports Medicine

## 2022-11-20 DIAGNOSIS — M7918 Myalgia, other site: Secondary | ICD-10-CM

## 2022-11-20 MED ORDER — ALPRAZOLAM 0.5 MG PO TABS
0.5000 mg | ORAL_TABLET | Freq: Every day | ORAL | 0 refills | Status: DC | PRN
  Filled 2022-11-20 – 2022-11-21 (×2): qty 30, 30d supply, fill #0

## 2022-11-20 NOTE — Telephone Encounter (Signed)
I have not seen me since 2022, PCP may be okay refilling this.

## 2022-11-21 ENCOUNTER — Other Ambulatory Visit (HOSPITAL_BASED_OUTPATIENT_CLINIC_OR_DEPARTMENT_OTHER): Payer: Self-pay

## 2022-11-21 MED ORDER — VILAZODONE HCL 10 MG PO TABS
10.0000 mg | ORAL_TABLET | Freq: Every day | ORAL | 0 refills | Status: DC
  Filled 2022-11-21: qty 30, 30d supply, fill #0

## 2022-11-21 MED ORDER — PREGABALIN 100 MG PO CAPS
100.0000 mg | ORAL_CAPSULE | Freq: Two times a day (BID) | ORAL | 1 refills | Status: DC
  Filled 2022-11-21 – 2023-02-25 (×2): qty 180, 90d supply, fill #0
  Filled 2023-05-25: qty 180, 90d supply, fill #1

## 2022-11-26 ENCOUNTER — Telehealth (INDEPENDENT_AMBULATORY_CARE_PROVIDER_SITE_OTHER): Payer: Commercial Managed Care - PPO | Admitting: Psychiatry

## 2022-11-26 ENCOUNTER — Encounter (HOSPITAL_COMMUNITY): Payer: Self-pay | Admitting: Psychiatry

## 2022-11-26 DIAGNOSIS — F411 Generalized anxiety disorder: Secondary | ICD-10-CM

## 2022-11-26 DIAGNOSIS — F410 Panic disorder [episodic paroxysmal anxiety] without agoraphobia: Secondary | ICD-10-CM

## 2022-11-26 DIAGNOSIS — F331 Major depressive disorder, recurrent, moderate: Secondary | ICD-10-CM | POA: Diagnosis not present

## 2022-11-26 NOTE — Progress Notes (Signed)
Patient ID: GURTHA PICKER, female   DOB: 1966/07/25, 57 y.o.   MRN: [id number]  [facility] Follow up visit  SURIYAH VERGARA [id number] 57 y.o.  11/26/2022 8:43 AM  Virtual Visit via Video Note  I connected with Siria D Lapinski on 11/26/22 at  8:30 AM EST by a video enabled telemedicine application and verified that I am speaking with the correct person using two identifiers.  Location: Patient: home Provider: home office   I discussed the limitations of evaluation and management by telemedicine and the availability of in person appointments. The patient expressed understanding and agreed to proceed.     I discussed the assessment and treatment plan with the patient. The patient was provided an opportunity to ask questions and all were answered. The patient agreed with the plan and demonstrated an understanding of the instructions.   The patient was advised to call back or seek an in-person evaluation if the symptoms worsen or if the condition fails to improve as anticipated.  I provided 15 minutes of non-face-to-face time during this encounter.            Chief Complaint:  Depression follow up       HPI: Was doing fair, had a relationship ended after 3 months, feeling down, subdued. Says should have not got in that but trying to work it thru and need therapist Son is supportive Working but does feel subdued due to breakup    Overall job stress is there but feels getting moody, depressed  Modifying factor: son Aggravating factor : can be job   Duration  7 -8 years Severity subdued   Past Psychiatric History/Hospitalization(s) Manged for depression and mood symptoms for more then 20 years.  Prior Suicide Attempts: No  Medical History; Past Medical History:  Diagnosis Date   ADHD (attention deficit hyperactivity disorder)    Anxiety    Arthritis    back and hips    Depression    Diabetes mellitus type II, controlled ([place]) 08/15/2014   GERD  (gastroesophageal reflux disease)    HPV (human papilloma virus) infection    Hyperlipidemia    Lateral meniscus tear    left knee   Menopausal state    PONV (postoperative nausea and vomiting)    slow to wake up    Pre-diabetes    Shortness of breath dyspnea    with exertion    Sleep apnea    cpap -0 setting at 16, uses CPAP nightly   Stress incontinence     Allergies: Allergies  Allergen Reactions   Food Swelling    Big Red Gum makes her tongue swell   Latuda [Lurasidone Hcl] Other (See Comments)    Severe aggitation   Lipitor [Atorvastatin] Other (See Comments)    Muscle aches   Atorvastatin Calcium    Belviq [Lorcaserin Hcl]     Increased appetitie   Contrave [Naltrexone-Bupropion Hcl Er] Other (See Comments)    Sleepy.    Medications: Outpatient Encounter Medications as of 11/26/2022  Medication Sig   acetaminophen (TYLENOL) 500 MG tablet Take 1 tablet (500 mg total) by mouth every 6 (six) hours as needed.   ALPRAZolam (XANAX) 0.5 MG tablet Take 1 tablet (0.5 mg total) by mouth daily as needed.   Blood Glucose Monitoring Suppl (FREESTYLE LITE) w/Device KIT USE AS DIRECTED TO TEST BLOOD SUGAR 4 TIMES A DAY AS DIRECTED   citalopram (CELEXA) 40 MG tablet Take 1 tablet (40 mg total) by mouth daily.  ferrous sulfate 325 (65 FE) MG tablet Take 325 mg by mouth daily with breakfast.   glucose blood (FREESTYLE LITE) test strip Use as directed to test blood sugar 4 times daily   ibuprofen (ADVIL) 800 MG tablet Take 1 tablet (800 mg total) by mouth 3 (three) times daily with meals as needed for headache, moderate pain or cramping.   lamoTRIgine (LAMICTAL) 200 MG tablet Take 1 tablet (200 mg total) by mouth daily.   lamoTRIgine (LAMICTAL) 25 MG tablet Take 2 tablets (50 mg total) by mouth daily. Adding '50mg'$  a day in addition to '200mg'$    Lancets (FREESTYLE) lancets Use as directed to test blood sugar 4 times daily   mirabegron ER (MYRBETRIQ) 50 MG TB24 tablet Take 1 tablet (50 mg  total) by mouth daily.   pregabalin (LYRICA) 100 MG capsule Take 1 capsule (100 mg total) by mouth 2 (two) times daily.   rosuvastatin (CRESTOR) 10 MG tablet Take 1 tablet (10 mg total) by mouth daily.   Semaglutide, 2 MG/DOSE, (OZEMPIC, 2 MG/DOSE,) 8 MG/3ML SOPN Inject 2 mg into the skin once a week.   Vilazodone HCl (VIIBRYD) 10 MG TABS Take 1 tablet (10 mg total) by mouth daily.   Vitamin D, Ergocalciferol, (DRISDOL) 1.25 MG (50000 UNIT) CAPS capsule TAKE 1 CAPSULE (50,000 UNITS TOTAL) BY MOUTH EVERY 7 (SEVEN) DAYS.   No facility-administered encounter medications on file as of 11/26/2022.     Family History; Family History  Problem Relation Age of Onset   Hyperlipidemia Mother    Sleep apnea Mother    Depression Mother    Kidney disease Mother    Diabetes Father    Hypertension Father    COPD Father    Depression Sister    Alcohol abuse Paternal Grandfather    Diabetes Paternal Aunt    Diabetes Paternal Uncle    Alcohol abuse Paternal Uncle    Leukemia Paternal Uncle       Labs:  Recent Results (from the past 2160 hour(s))  I-STAT, chem 8     Status: Abnormal   Collection Time: 10/29/22 12:42 PM  Result Value Ref Range   Sodium 142 135 - 145 mmol/L   Potassium 3.3 (L) 3.5 - 5.1 mmol/L   Chloride 105 98 - 111 mmol/L   BUN 15 6 - 20 mg/dL   Creatinine, Ser 0.60 0.44 - 1.00 mg/dL   Glucose, Bld 91 70 - 99 mg/dL    Comment: Glucose reference range applies only to samples taken after fasting for at least 8 hours.   Calcium, Ion 1.10 (L) 1.15 - 1.40 mmol/L   TCO2 25 22 - 32 mmol/L   Hemoglobin 15.0 12.0 - 15.0 g/dL   HCT 44.0 36.0 - 46.0 %  Surgical pathology     Status: None   Collection Time: 10/29/22  2:04 PM  Result Value Ref Range   SURGICAL PATHOLOGY      SURGICAL PATHOLOGY CASE: [id number] PATIENT: Jonica Hoe Surgical Pathology Report     Clinical History: Menorrhagia, fibroids (crm)     FINAL MICROSCOPIC DIAGNOSIS:  A. ENDOMETRIUM, CURETTAGE: -  Scant fragments of benign endometrial surface epithelium. - Additional fragments normal endocervical tissue and squamous epithelium. - Negative for endometrial hyperplasia and malignancy.  B. FIBROID, EXCISION: - Benign smooth muscle, consistent with leiomyoma - Benign proliferative endometrium, negative for hyperplasia or malignancy  GROSS DESCRIPTION:  A: Received in formalin are 1.5 x 1.5 x 0.3 cm of soft tan-red tissue and clotted blood.  The specimen is submitted in toto.  B: Received in formalin are fragments of soft tan-white tissue measuring 3 x 3 x 0.5 cm in aggregate.  The specimen is entirely submitted in 3 cassettes.  [facility] 10/30/2022)   Final Diagnosis performed by Tilford Pillar, MD.   Electronically signed 10/31/2022 Technical and / or Professiona l components performed at [facility] [address] [place], [place] [address].  Immunohistochemistry Technical component (if applicable) was performed at [facility]. [address], [place], [place], [place] [address].   IMMUNOHISTOCHEMISTRY DISCLAIMER (if applicable): Some of these immunohistochemical stains may have been developed and the performance characteristics determine by [facility]. Some may not have been cleared or approved by the U.S. Food and Drug Administration. The FDA has determined that such clearance or approval is not necessary. This test is used for clinical purposes. It should not be regarded as investigational or for research. This laboratory is certified under the [place] (CLIA-88) as qualified to perform high complexity clinical laboratory testing.  The controls stained appropriately.   Pregnancy, urine POC     Status: None   Collection Time: 10/29/22  3:54 PM  Result Value Ref Range   Preg Test, Ur NEGATIVE NEGATIVE    Comment:        THE SENSITIVITY OF THIS METHODOLOGY IS >24 mIU/mL   HM  DIABETES EYE EXAM     Status: None   Collection Time: 11/05/22 12:00 AM  Result Value Ref Range   HM Diabetic Eye Exam No Retinopathy No Retinopathy  POCT glycosylated hemoglobin (Hb A1C)     Status: Abnormal   Collection Time: 11/17/22  8:37 AM  Result Value Ref Range   Hemoglobin A1C 5.7 (A) 4.0 - 5.6 %   HbA1c POC (<> result, manual entry)     HbA1c, POC (prediabetic range)     HbA1c, POC (controlled diabetic range)            Mental Status Examination;   Psychiatric Specialty Exam: Physical Exam  Review of Systems  Cardiovascular:  Negative for chest pain.  Neurological:  Negative for tremors.  Psychiatric/Behavioral:  Positive for depression. Negative for hallucinations.     Last menstrual period 12/17/2016.There is no height or weight on file to calculate BMI.  General Appearance:casual  Eye Contact::  fair  Speech:  Normal Rate  Volume:  Normal  Mood : subdued  Affect:  congruent  Thought Process:  Coherent  Orientation:  Full (Time, Place, and Person)  Thought Content:  Rumination  Suicidal Thoughts:  No  Homicidal Thoughts:  No  Memory:  Immediate;   Fair Recent;   Fair  Judgement:  Fair  Insight:  Shallow  Psychomotor Activity:  Normal  Concentration:  Fair  Recall:  Fair  Akathisia:  Negative  Handed:  Right  AIMS (if indicated):     Assets:  Communication Skills Desire for Improvement Financial Resources/Insurance Housing  Sleep:        Assessment: Axis I: mood disorder unspecified. Rule out mood disorder secondary to general medical condition. Major depressive disorder recurrent moderate. Rule out panic disorder. Generalized anxiety disorder   Axis III:  Past Medical History:  Diagnosis Date   ADHD (attention deficit hyperactivity disorder)    Anxiety    Arthritis    back and hips    Depression    Diabetes mellitus type II, controlled ([place]) 08/15/2014   GERD (gastroesophageal reflux disease)  HPV (human papilloma virus) infection     Hyperlipidemia    Lateral meniscus tear    left knee   Menopausal state    PONV (postoperative nausea and vomiting)    slow to wake up    Pre-diabetes    Shortness of breath dyspnea    with exertion    Sleep apnea    cpap -0 setting at 16, uses CPAP nightly   Stress incontinence     Axis IV: psychosocial   Treatment Plan and Summary:  Prior documentation reviewed   Depression: depressed:feels subdued, continue celexa, viibryd, lamictal She plans to get in therapy to process the breakup and not increase med Not suicidal  Lyrica from pcp Anxiety :stressed due to above, son is good support, continue celexa, xanax and recommend therapy   Panic attacks : sporadic, continue xanax, for now looking for therapist, she plans to find someon outside [place]   Call for refills, provided supportive therapy  Fu 66m      , Debarah Mccumbers, MD 11/26/2022

## 2022-12-02 ENCOUNTER — Other Ambulatory Visit (HOSPITAL_COMMUNITY): Payer: Self-pay | Admitting: Psychiatry

## 2022-12-02 ENCOUNTER — Other Ambulatory Visit (HOSPITAL_BASED_OUTPATIENT_CLINIC_OR_DEPARTMENT_OTHER): Payer: Self-pay

## 2022-12-02 DIAGNOSIS — F331 Major depressive disorder, recurrent, moderate: Secondary | ICD-10-CM

## 2022-12-02 MED ORDER — CITALOPRAM HYDROBROMIDE 40 MG PO TABS
40.0000 mg | ORAL_TABLET | Freq: Every day | ORAL | 2 refills | Status: DC
  Filled 2022-12-02: qty 30, 30d supply, fill #0
  Filled 2023-01-15: qty 30, 30d supply, fill #1
  Filled 2023-02-16: qty 30, 30d supply, fill #2

## 2022-12-09 DIAGNOSIS — G4733 Obstructive sleep apnea (adult) (pediatric): Secondary | ICD-10-CM | POA: Diagnosis not present

## 2022-12-18 ENCOUNTER — Other Ambulatory Visit (HOSPITAL_COMMUNITY): Payer: Self-pay | Admitting: Psychiatry

## 2022-12-19 ENCOUNTER — Other Ambulatory Visit: Payer: Self-pay

## 2022-12-19 ENCOUNTER — Other Ambulatory Visit (HOSPITAL_BASED_OUTPATIENT_CLINIC_OR_DEPARTMENT_OTHER): Payer: Self-pay

## 2022-12-19 MED ORDER — VILAZODONE HCL 10 MG PO TABS
10.0000 mg | ORAL_TABLET | Freq: Every day | ORAL | 0 refills | Status: DC
  Filled 2022-12-19: qty 30, 30d supply, fill #0

## 2022-12-19 MED ORDER — LAMOTRIGINE 25 MG PO TABS
50.0000 mg | ORAL_TABLET | Freq: Every day | ORAL | 0 refills | Status: DC
  Filled 2022-12-19: qty 60, 30d supply, fill #0

## 2022-12-22 ENCOUNTER — Other Ambulatory Visit (HOSPITAL_COMMUNITY): Payer: Self-pay | Admitting: Psychiatry

## 2022-12-22 ENCOUNTER — Other Ambulatory Visit (HOSPITAL_BASED_OUTPATIENT_CLINIC_OR_DEPARTMENT_OTHER): Payer: Self-pay

## 2022-12-22 DIAGNOSIS — F410 Panic disorder [episodic paroxysmal anxiety] without agoraphobia: Secondary | ICD-10-CM

## 2022-12-22 DIAGNOSIS — F411 Generalized anxiety disorder: Secondary | ICD-10-CM

## 2022-12-22 MED ORDER — LAMOTRIGINE 200 MG PO TABS
200.0000 mg | ORAL_TABLET | Freq: Every day | ORAL | 0 refills | Status: DC
  Filled 2022-12-22: qty 30, 30d supply, fill #0

## 2022-12-22 MED ORDER — ALPRAZOLAM 0.5 MG PO TABS
0.5000 mg | ORAL_TABLET | Freq: Every day | ORAL | 0 refills | Status: DC | PRN
  Filled 2022-12-22: qty 30, 30d supply, fill #0

## 2022-12-25 ENCOUNTER — Encounter: Payer: Self-pay | Admitting: Radiology

## 2023-01-07 DIAGNOSIS — G4733 Obstructive sleep apnea (adult) (pediatric): Secondary | ICD-10-CM | POA: Diagnosis not present

## 2023-01-15 ENCOUNTER — Other Ambulatory Visit (HOSPITAL_COMMUNITY): Payer: Self-pay | Admitting: Psychiatry

## 2023-01-15 ENCOUNTER — Other Ambulatory Visit (HOSPITAL_BASED_OUTPATIENT_CLINIC_OR_DEPARTMENT_OTHER): Payer: Self-pay

## 2023-01-15 ENCOUNTER — Other Ambulatory Visit: Payer: Self-pay

## 2023-01-15 MED ORDER — LAMOTRIGINE 25 MG PO TABS
50.0000 mg | ORAL_TABLET | Freq: Every day | ORAL | 0 refills | Status: DC
  Filled 2023-01-15: qty 29, 14d supply, fill #0
  Filled 2023-01-15: qty 34, 17d supply, fill #0
  Filled 2023-01-15: qty 26, 13d supply, fill #0

## 2023-01-15 MED ORDER — LAMOTRIGINE 200 MG PO TABS
200.0000 mg | ORAL_TABLET | Freq: Every day | ORAL | 0 refills | Status: DC
  Filled 2023-01-15: qty 30, 30d supply, fill #0

## 2023-01-16 ENCOUNTER — Other Ambulatory Visit: Payer: Self-pay

## 2023-01-28 ENCOUNTER — Other Ambulatory Visit (HOSPITAL_COMMUNITY): Payer: Self-pay | Admitting: Psychiatry

## 2023-01-29 ENCOUNTER — Other Ambulatory Visit (HOSPITAL_COMMUNITY): Payer: Self-pay | Admitting: Psychiatry

## 2023-01-29 ENCOUNTER — Other Ambulatory Visit: Payer: Self-pay

## 2023-01-29 ENCOUNTER — Other Ambulatory Visit (HOSPITAL_BASED_OUTPATIENT_CLINIC_OR_DEPARTMENT_OTHER): Payer: Self-pay

## 2023-01-29 DIAGNOSIS — F411 Generalized anxiety disorder: Secondary | ICD-10-CM

## 2023-01-29 DIAGNOSIS — F410 Panic disorder [episodic paroxysmal anxiety] without agoraphobia: Secondary | ICD-10-CM

## 2023-01-29 MED ORDER — VILAZODONE HCL 10 MG PO TABS
10.0000 mg | ORAL_TABLET | Freq: Every day | ORAL | 0 refills | Status: DC
  Filled 2023-01-29: qty 30, 30d supply, fill #0

## 2023-01-29 MED ORDER — ALPRAZOLAM 0.5 MG PO TABS
0.5000 mg | ORAL_TABLET | Freq: Every day | ORAL | 0 refills | Status: DC | PRN
  Filled 2023-01-29: qty 30, 30d supply, fill #0

## 2023-02-07 DIAGNOSIS — G4733 Obstructive sleep apnea (adult) (pediatric): Secondary | ICD-10-CM | POA: Diagnosis not present

## 2023-02-16 ENCOUNTER — Other Ambulatory Visit (HOSPITAL_BASED_OUTPATIENT_CLINIC_OR_DEPARTMENT_OTHER): Payer: Self-pay

## 2023-02-16 ENCOUNTER — Other Ambulatory Visit (HOSPITAL_COMMUNITY): Payer: Self-pay | Admitting: Psychiatry

## 2023-02-16 ENCOUNTER — Other Ambulatory Visit: Payer: Self-pay | Admitting: Physician Assistant

## 2023-02-16 DIAGNOSIS — E1121 Type 2 diabetes mellitus with diabetic nephropathy: Secondary | ICD-10-CM

## 2023-02-16 DIAGNOSIS — E559 Vitamin D deficiency, unspecified: Secondary | ICD-10-CM

## 2023-02-16 MED ORDER — LAMOTRIGINE 25 MG PO TABS
50.0000 mg | ORAL_TABLET | Freq: Every day | ORAL | 0 refills | Status: DC
  Filled 2023-02-16: qty 60, 30d supply, fill #0

## 2023-02-16 MED ORDER — LAMOTRIGINE 200 MG PO TABS
200.0000 mg | ORAL_TABLET | Freq: Every day | ORAL | 0 refills | Status: DC
  Filled 2023-02-16: qty 30, 30d supply, fill #0

## 2023-02-17 ENCOUNTER — Other Ambulatory Visit (HOSPITAL_BASED_OUTPATIENT_CLINIC_OR_DEPARTMENT_OTHER): Payer: Self-pay

## 2023-02-17 MED ORDER — OZEMPIC (2 MG/DOSE) 8 MG/3ML ~~LOC~~ SOPN
2.0000 mg | PEN_INJECTOR | SUBCUTANEOUS | 0 refills | Status: DC
  Filled 2023-02-17: qty 9, 84d supply, fill #0

## 2023-02-23 ENCOUNTER — Other Ambulatory Visit (HOSPITAL_BASED_OUTPATIENT_CLINIC_OR_DEPARTMENT_OTHER): Payer: Self-pay

## 2023-02-25 ENCOUNTER — Telehealth (HOSPITAL_COMMUNITY): Payer: Commercial Managed Care - PPO | Admitting: Psychiatry

## 2023-02-25 ENCOUNTER — Other Ambulatory Visit (HOSPITAL_COMMUNITY): Payer: Self-pay | Admitting: Psychiatry

## 2023-02-25 ENCOUNTER — Other Ambulatory Visit (HOSPITAL_BASED_OUTPATIENT_CLINIC_OR_DEPARTMENT_OTHER): Payer: Self-pay

## 2023-02-26 ENCOUNTER — Other Ambulatory Visit (HOSPITAL_BASED_OUTPATIENT_CLINIC_OR_DEPARTMENT_OTHER): Payer: Self-pay

## 2023-02-26 ENCOUNTER — Other Ambulatory Visit: Payer: Self-pay

## 2023-02-26 MED ORDER — VILAZODONE HCL 10 MG PO TABS
10.0000 mg | ORAL_TABLET | Freq: Every day | ORAL | 0 refills | Status: DC
  Filled 2023-02-26: qty 30, 30d supply, fill #0

## 2023-03-03 ENCOUNTER — Telehealth (INDEPENDENT_AMBULATORY_CARE_PROVIDER_SITE_OTHER): Payer: Commercial Managed Care - PPO | Admitting: Psychiatry

## 2023-03-03 ENCOUNTER — Encounter (HOSPITAL_COMMUNITY): Payer: Self-pay | Admitting: Psychiatry

## 2023-03-03 DIAGNOSIS — F331 Major depressive disorder, recurrent, moderate: Secondary | ICD-10-CM | POA: Diagnosis not present

## 2023-03-03 DIAGNOSIS — F411 Generalized anxiety disorder: Secondary | ICD-10-CM | POA: Diagnosis not present

## 2023-03-03 NOTE — Progress Notes (Signed)
Patient ID: Shannon [name], female   DOB: 10-17-1966, 57 y.o.   MRN: [id number]  [facility] Follow up visit  MYLI VANPOOL [id number] 57 y.o.  03/03/2023 3:58 PM    Virtual Visit via Video Note  I connected with Shannon [name] on 03/03/23 at  3:45 PM EDT by a video enabled telemedicine application and verified that I am speaking with the correct person using two identifiers.  Location: Patient: home Provider: office   I discussed the limitations of evaluation and management by telemedicine and the availability of in person appointments. The patient expressed understanding and agreed to proceed.    I discussed the assessment and treatment plan with the patient. The patient was provided an opportunity to ask questions and all were answered. The patient agreed with the plan and demonstrated an understanding of the instructions.   The patient was advised to call back or seek an in-person evaluation if the symptoms worsen or if the condition fails to improve as anticipated.     Chief Complaint:  Depression follow up       HPI: Works as Buyer, retail over the weekend  Last visit had a breakup and was subdued, we kept same meds as she didn't want to change and recommended therapy Doing better now, tolerating meds, moving on from breakup and not subdued Son is supportive  Mom is good support  Modifying factor: son, mom Aggravating factor : job can be   Duration  7 -8 years Severity improved   Past Psychiatric History/Hospitalization(s) Manged for depression and mood symptoms for more then 20 years.  Prior Suicide Attempts: No  Medical History; Past Medical History:  Diagnosis Date   ADHD (attention deficit hyperactivity disorder)    Anxiety    Arthritis    back and hips    Depression    Diabetes mellitus type II, controlled ([facility]) 08/15/2014   GERD (gastroesophageal reflux disease)    HPV (human papilloma virus) infection    Hyperlipidemia     Lateral meniscus tear    left knee   Menopausal state    PONV (postoperative nausea and vomiting)    slow to wake up    Pre-diabetes    Shortness of breath dyspnea    with exertion    Sleep apnea    cpap -0 setting at 16, uses CPAP nightly   Stress incontinence     Allergies: Allergies  Allergen Reactions   Food Swelling    Big Red Gum makes her tongue swell   Latuda [Lurasidone Hcl] Other (See Comments)    Severe aggitation   Lipitor [Atorvastatin] Other (See Comments)    Muscle aches   Atorvastatin Calcium    Belviq [Lorcaserin Hcl]     Increased appetitie   Contrave [Naltrexone-Bupropion Hcl Er] Other (See Comments)    Sleepy.    Medications: Outpatient Encounter Medications as of 03/03/2023  Medication Sig   acetaminophen (TYLENOL) 500 MG tablet Take 1 tablet (500 mg total) by mouth every 6 (six) hours as needed.   ALPRAZolam (XANAX) 0.5 MG tablet Take 1 tablet (0.5 mg total) by mouth daily as needed.   Blood Glucose Monitoring Suppl (FREESTYLE LITE) w/Device KIT USE AS DIRECTED TO TEST BLOOD SUGAR 4 TIMES A DAY AS DIRECTED   citalopram (CELEXA) 40 MG tablet Take 1 tablet (40 mg total) by mouth daily.   ferrous sulfate 325 (65 FE) MG tablet Take 325 mg by mouth daily with breakfast.   glucose blood (  FREESTYLE LITE) test strip Use as directed to test blood sugar 4 times daily   ibuprofen (ADVIL) 800 MG tablet Take 1 tablet (800 mg total) by mouth 3 (three) times daily with meals as needed for headache, moderate pain or cramping.   lamoTRIgine (LAMICTAL) 200 MG tablet Take 1 tablet (200 mg total) by mouth daily.   lamoTRIgine (LAMICTAL) 25 MG tablet Take 2 tablets (50 mg total) by mouth daily. Adding 50mg  a day in addition to 200mg    Lancets (FREESTYLE) lancets Use as directed to test blood sugar 4 times daily   mirabegron ER (MYRBETRIQ) 50 MG TB24 tablet Take 1 tablet (50 mg total) by mouth daily.   pregabalin (LYRICA) 100 MG capsule Take 1 capsule (100 mg total) by mouth  2 (two) times daily.   rosuvastatin (CRESTOR) 10 MG tablet Take 1 tablet (10 mg total) by mouth daily.   Semaglutide, 2 MG/DOSE, (OZEMPIC, 2 MG/DOSE,) 8 MG/3ML SOPN Inject 2 mg into the skin once a week.   Vilazodone HCl (VIIBRYD) 10 MG TABS Take 1 tablet (10 mg total) by mouth daily.   Vitamin D, Ergocalciferol, (DRISDOL) 1.25 MG (50000 UNIT) CAPS capsule TAKE 1 CAPSULE (50,000 UNITS TOTAL) BY MOUTH EVERY 7 (SEVEN) DAYS.   No facility-administered encounter medications on file as of 03/03/2023.     Family History; Family History  Problem Relation Age of Onset   Hyperlipidemia Mother    Sleep apnea Mother    Depression Mother    Kidney disease Mother    Diabetes Father    Hypertension Father    COPD Father    Depression Sister    Alcohol abuse Paternal Grandfather    Diabetes Paternal Aunt    Diabetes Paternal Uncle    Alcohol abuse Paternal Uncle    Leukemia Paternal Uncle       Labs:  No results found for this or any previous visit (from the past 2160 hour(s)).         Mental Status Examination;   Psychiatric Specialty Exam: Physical Exam  Review of Systems  Cardiovascular:  Negative for chest pain.  Neurological:  Negative for tremors.  Psychiatric/Behavioral:  Negative for depression and hallucinations.     Last menstrual period 12/17/2016.There is no height or weight on file to calculate BMI.  General Appearance:casual  Eye Contact::  fair  Speech:  Normal Rate  Volume:  Normal  Mood :better  Affect:  congruent  Thought Process:  Coherent  Orientation:  Full (Time, Place, and Person)  Thought Content:  Rumination  Suicidal Thoughts:  No  Homicidal Thoughts:  No  Memory:  Immediate;   Fair Recent;   Fair  Judgement:  Fair  Insight:  Shallow  Psychomotor Activity:  Normal  Concentration:  Fair  Recall:  Fair  Akathisia:  Negative  Handed:  Right  AIMS (if indicated):     Assets:  Communication Skills Desire for Improvement Financial  Resources/Insurance Housing  Sleep:        Assessment: Axis I: mood disorder unspecified. Rule out mood disorder secondary to general medical condition. Major depressive disorder recurrent moderate. Rule out panic disorder. Generalized anxiety disorder   Axis III:  Past Medical History:  Diagnosis Date   ADHD (attention deficit hyperactivity disorder)    Anxiety    Arthritis    back and hips    Depression    Diabetes mellitus type II, controlled ([facility]) 08/15/2014   GERD (gastroesophageal reflux disease)    HPV (human papilloma virus)  infection    Hyperlipidemia    Lateral meniscus tear    left knee   Menopausal state    PONV (postoperative nausea and vomiting)    slow to wake up    Pre-diabetes    Shortness of breath dyspnea    with exertion    Sleep apnea    cpap -0 setting at 16, uses CPAP nightly   Stress incontinence     Axis IV: psychosocial   Treatment Plan and Summary:  Prior documentation reviewed   Depression: depressed:better, continue viibryd, ccelexa Lyrica from pcp Anxiety :manageable continue celexa, also on prn xanax   Panic attacks : stable or in control, continue celexa   Call for refills, provided supportive therapy  Fu 21m.      , Senora Lacson, MD 03/03/2023

## 2023-03-10 ENCOUNTER — Other Ambulatory Visit: Payer: Self-pay | Admitting: Physician Assistant

## 2023-03-10 DIAGNOSIS — E559 Vitamin D deficiency, unspecified: Secondary | ICD-10-CM

## 2023-03-12 ENCOUNTER — Other Ambulatory Visit (HOSPITAL_BASED_OUTPATIENT_CLINIC_OR_DEPARTMENT_OTHER): Payer: Self-pay

## 2023-03-12 MED ORDER — VITAMIN D (ERGOCALCIFEROL) 1.25 MG (50000 UNIT) PO CAPS
50000.0000 [IU] | ORAL_CAPSULE | ORAL | 1 refills | Status: DC
  Filled 2023-03-12: qty 12, 84d supply, fill #0
  Filled 2023-06-10: qty 12, 84d supply, fill #1

## 2023-03-17 ENCOUNTER — Other Ambulatory Visit (HOSPITAL_COMMUNITY): Payer: Self-pay | Admitting: Psychiatry

## 2023-03-17 DIAGNOSIS — F410 Panic disorder [episodic paroxysmal anxiety] without agoraphobia: Secondary | ICD-10-CM

## 2023-03-17 DIAGNOSIS — F411 Generalized anxiety disorder: Secondary | ICD-10-CM

## 2023-03-18 ENCOUNTER — Encounter: Payer: Self-pay | Admitting: Medical-Surgical

## 2023-03-18 ENCOUNTER — Other Ambulatory Visit (HOSPITAL_COMMUNITY): Payer: Self-pay | Admitting: Psychiatry

## 2023-03-18 ENCOUNTER — Other Ambulatory Visit (HOSPITAL_BASED_OUTPATIENT_CLINIC_OR_DEPARTMENT_OTHER): Payer: Self-pay

## 2023-03-18 ENCOUNTER — Encounter (HOSPITAL_BASED_OUTPATIENT_CLINIC_OR_DEPARTMENT_OTHER): Payer: Self-pay

## 2023-03-18 ENCOUNTER — Telehealth (HOSPITAL_COMMUNITY): Payer: Self-pay | Admitting: *Deleted

## 2023-03-18 ENCOUNTER — Ambulatory Visit (INDEPENDENT_AMBULATORY_CARE_PROVIDER_SITE_OTHER): Payer: Commercial Managed Care - PPO | Admitting: Medical-Surgical

## 2023-03-18 ENCOUNTER — Other Ambulatory Visit (HOSPITAL_COMMUNITY): Payer: Self-pay | Admitting: *Deleted

## 2023-03-18 VITALS — BP 109/67 | HR 81 | Resp 20 | Ht 65.0 in | Wt 172.1 lb

## 2023-03-18 DIAGNOSIS — F411 Generalized anxiety disorder: Secondary | ICD-10-CM

## 2023-03-18 DIAGNOSIS — L03116 Cellulitis of left lower limb: Secondary | ICD-10-CM

## 2023-03-18 DIAGNOSIS — F331 Major depressive disorder, recurrent, moderate: Secondary | ICD-10-CM

## 2023-03-18 DIAGNOSIS — F410 Panic disorder [episodic paroxysmal anxiety] without agoraphobia: Secondary | ICD-10-CM

## 2023-03-18 MED ORDER — DOXYCYCLINE HYCLATE 100 MG PO TABS
100.0000 mg | ORAL_TABLET | Freq: Two times a day (BID) | ORAL | 0 refills | Status: AC
  Filled 2023-03-18: qty 14, 7d supply, fill #0

## 2023-03-18 MED ORDER — ALPRAZOLAM 0.5 MG PO TABS
0.5000 mg | ORAL_TABLET | Freq: Every day | ORAL | 0 refills | Status: AC | PRN
  Filled 2023-03-18: qty 30, 30d supply, fill #0

## 2023-03-18 MED ORDER — CITALOPRAM HYDROBROMIDE 40 MG PO TABS
40.0000 mg | ORAL_TABLET | Freq: Every day | ORAL | 2 refills | Status: DC
  Filled 2023-03-18: qty 30, 30d supply, fill #0
  Filled 2023-04-20: qty 30, 30d supply, fill #1
  Filled 2023-05-25: qty 30, 30d supply, fill #2

## 2023-03-18 MED ORDER — LAMOTRIGINE 25 MG PO TABS
50.0000 mg | ORAL_TABLET | Freq: Every day | ORAL | 0 refills | Status: DC
  Filled 2023-03-18: qty 60, 30d supply, fill #0

## 2023-03-18 MED ORDER — FLUCONAZOLE 150 MG PO TABS
150.0000 mg | ORAL_TABLET | Freq: Once | ORAL | 0 refills | Status: AC
  Filled 2023-03-18: qty 1, 1d supply, fill #0

## 2023-03-18 MED ORDER — LAMOTRIGINE 200 MG PO TABS
200.0000 mg | ORAL_TABLET | Freq: Every day | ORAL | 0 refills | Status: DC
  Filled 2023-03-18: qty 30, 30d supply, fill #0

## 2023-03-18 NOTE — Telephone Encounter (Signed)
Patient called for a refill on  -- ALPRAZolam (XANAX) 0.5 MG Take 1 tablet  (0.5 mg total) by  mouth daily as needed.   Not listed in medications BUT found in last note   Last Appt --03/01/23 Next Appt -- 09/09/23

## 2023-03-18 NOTE — Progress Notes (Signed)
        Established patient visit  History, exam, impression, and plan:  1. Cellulitis of left lower extremity Pleasant 57 year old female presenting today with reports of an abscess that formed in her groin approximately 1 month ago.  She was managing this at home as she usually does given that these are recurrent issues.  At 1 point the abscess was so full of fluid that she used a sterile needle to poke a hole in it.  She was able to drain the fluid completely and the area appeared to be healing.  Unfortunately, about 2 weeks ago, she noticed a pin size hole that started at the site of the previous abscess and this has enlarged slightly.  Has had a small amount of tan drainage from it.  Washes the area with antibacterial soap and has kept it covered with a Band-Aid.  Tender in the surrounding area now and has developed some surrounding erythema.  On exam, around shallow opening in the skin to the left upper inner thigh near the inguinal crease.  Wound bed is pink.  Mild erythema and darkening circumscribed approximately 1 cm surrounding the opening.  No fever, chills, or other myalgias.  Given her history of recurrent abscesses, the presence of this lesion lasting greater than 1 month, and surrounding erythema, treating for cellulitis with doxycycline 100 mg twice daily x 7 days.  Adding Diflucan for VVC prophylaxis.  Continue site care as discussed.  Wound dressed with a small piece of Xeroform and covered with a sterile Band-Aid here in our office.  Remaining Xeroform provided for patient use at home.  Procedures performed this visit: None.  Return if symptoms worsen or fail to improve.  __________________________________ Thayer Ohm, DNP, APRN, FNP-BC Primary Care and Sports Medicine [facility] [place]

## 2023-03-19 ENCOUNTER — Other Ambulatory Visit: Payer: Self-pay

## 2023-03-19 ENCOUNTER — Other Ambulatory Visit (HOSPITAL_BASED_OUTPATIENT_CLINIC_OR_DEPARTMENT_OTHER): Payer: Self-pay

## 2023-03-20 ENCOUNTER — Other Ambulatory Visit (HOSPITAL_BASED_OUTPATIENT_CLINIC_OR_DEPARTMENT_OTHER): Payer: Self-pay

## 2023-03-26 ENCOUNTER — Other Ambulatory Visit (HOSPITAL_BASED_OUTPATIENT_CLINIC_OR_DEPARTMENT_OTHER): Payer: Self-pay

## 2023-04-02 ENCOUNTER — Other Ambulatory Visit (HOSPITAL_BASED_OUTPATIENT_CLINIC_OR_DEPARTMENT_OTHER): Payer: Self-pay

## 2023-04-04 ENCOUNTER — Other Ambulatory Visit (HOSPITAL_COMMUNITY): Payer: Self-pay | Admitting: Psychiatry

## 2023-04-06 ENCOUNTER — Other Ambulatory Visit (HOSPITAL_BASED_OUTPATIENT_CLINIC_OR_DEPARTMENT_OTHER): Payer: Self-pay

## 2023-04-06 MED ORDER — VILAZODONE HCL 10 MG PO TABS
10.0000 mg | ORAL_TABLET | Freq: Every day | ORAL | 0 refills | Status: DC
  Filled 2023-04-06: qty 30, 30d supply, fill #0

## 2023-04-20 ENCOUNTER — Other Ambulatory Visit (HOSPITAL_COMMUNITY): Payer: Self-pay | Admitting: Psychiatry

## 2023-04-21 ENCOUNTER — Other Ambulatory Visit: Payer: Self-pay

## 2023-04-21 ENCOUNTER — Other Ambulatory Visit (HOSPITAL_BASED_OUTPATIENT_CLINIC_OR_DEPARTMENT_OTHER): Payer: Self-pay

## 2023-04-21 DIAGNOSIS — G4733 Obstructive sleep apnea (adult) (pediatric): Secondary | ICD-10-CM | POA: Diagnosis not present

## 2023-04-21 MED ORDER — LAMOTRIGINE 200 MG PO TABS
200.0000 mg | ORAL_TABLET | Freq: Every day | ORAL | 0 refills | Status: DC
  Filled 2023-04-21: qty 30, 30d supply, fill #0

## 2023-04-21 MED ORDER — LAMOTRIGINE 25 MG PO TABS
50.0000 mg | ORAL_TABLET | Freq: Every day | ORAL | 0 refills | Status: DC
  Filled 2023-04-21: qty 60, 30d supply, fill #0

## 2023-04-30 ENCOUNTER — Other Ambulatory Visit (HOSPITAL_COMMUNITY): Payer: Self-pay | Admitting: Psychiatry

## 2023-04-30 DIAGNOSIS — F411 Generalized anxiety disorder: Secondary | ICD-10-CM

## 2023-04-30 DIAGNOSIS — F410 Panic disorder [episodic paroxysmal anxiety] without agoraphobia: Secondary | ICD-10-CM

## 2023-05-01 ENCOUNTER — Other Ambulatory Visit (HOSPITAL_BASED_OUTPATIENT_CLINIC_OR_DEPARTMENT_OTHER): Payer: Self-pay

## 2023-05-01 ENCOUNTER — Other Ambulatory Visit (HOSPITAL_COMMUNITY): Payer: Self-pay | Admitting: Psychiatry

## 2023-05-01 ENCOUNTER — Other Ambulatory Visit: Payer: Self-pay

## 2023-05-01 DIAGNOSIS — F410 Panic disorder [episodic paroxysmal anxiety] without agoraphobia: Secondary | ICD-10-CM

## 2023-05-01 DIAGNOSIS — F411 Generalized anxiety disorder: Secondary | ICD-10-CM

## 2023-05-01 MED ORDER — VILAZODONE HCL 10 MG PO TABS
10.0000 mg | ORAL_TABLET | Freq: Every day | ORAL | 0 refills | Status: DC
  Filled 2023-05-01: qty 30, 30d supply, fill #0

## 2023-05-01 MED FILL — Alprazolam Tab 0.5 MG: ORAL | 30 days supply | Qty: 30 | Fill #0 | Status: AC

## 2023-05-04 ENCOUNTER — Other Ambulatory Visit (HOSPITAL_BASED_OUTPATIENT_CLINIC_OR_DEPARTMENT_OTHER): Payer: Self-pay

## 2023-05-20 ENCOUNTER — Other Ambulatory Visit (HOSPITAL_COMMUNITY): Payer: Self-pay | Admitting: Psychiatry

## 2023-05-20 ENCOUNTER — Other Ambulatory Visit: Payer: Self-pay | Admitting: Physician Assistant

## 2023-05-20 DIAGNOSIS — E1121 Type 2 diabetes mellitus with diabetic nephropathy: Secondary | ICD-10-CM

## 2023-05-21 ENCOUNTER — Other Ambulatory Visit (HOSPITAL_BASED_OUTPATIENT_CLINIC_OR_DEPARTMENT_OTHER): Payer: Self-pay

## 2023-05-21 ENCOUNTER — Ambulatory Visit: Payer: Commercial Managed Care - PPO | Admitting: Podiatry

## 2023-05-21 MED ORDER — LAMOTRIGINE 25 MG PO TABS
50.0000 mg | ORAL_TABLET | Freq: Every day | ORAL | 0 refills | Status: DC
  Filled 2023-05-21: qty 60, 30d supply, fill #0

## 2023-05-21 MED ORDER — LAMOTRIGINE 200 MG PO TABS
200.0000 mg | ORAL_TABLET | Freq: Every day | ORAL | 0 refills | Status: DC
  Filled 2023-05-21: qty 30, 30d supply, fill #0

## 2023-05-22 ENCOUNTER — Other Ambulatory Visit (HOSPITAL_BASED_OUTPATIENT_CLINIC_OR_DEPARTMENT_OTHER): Payer: Self-pay

## 2023-05-22 DIAGNOSIS — G4733 Obstructive sleep apnea (adult) (pediatric): Secondary | ICD-10-CM | POA: Diagnosis not present

## 2023-05-22 MED ORDER — OZEMPIC (2 MG/DOSE) 8 MG/3ML ~~LOC~~ SOPN
2.0000 mg | PEN_INJECTOR | SUBCUTANEOUS | 0 refills | Status: DC
Start: 2023-05-22 — End: 2023-09-01
  Filled 2023-05-22: qty 9, 84d supply, fill #0

## 2023-05-26 ENCOUNTER — Other Ambulatory Visit: Payer: Self-pay

## 2023-05-31 ENCOUNTER — Other Ambulatory Visit (HOSPITAL_BASED_OUTPATIENT_CLINIC_OR_DEPARTMENT_OTHER): Payer: Self-pay

## 2023-05-31 ENCOUNTER — Other Ambulatory Visit (HOSPITAL_COMMUNITY): Payer: Self-pay | Admitting: Psychiatry

## 2023-05-31 MED ORDER — VILAZODONE HCL 10 MG PO TABS
10.0000 mg | ORAL_TABLET | Freq: Every day | ORAL | 0 refills | Status: DC
  Filled 2023-05-31: qty 30, 30d supply, fill #0

## 2023-06-01 ENCOUNTER — Other Ambulatory Visit (HOSPITAL_BASED_OUTPATIENT_CLINIC_OR_DEPARTMENT_OTHER): Payer: Self-pay

## 2023-06-05 ENCOUNTER — Other Ambulatory Visit (HOSPITAL_BASED_OUTPATIENT_CLINIC_OR_DEPARTMENT_OTHER): Payer: Self-pay

## 2023-06-08 ENCOUNTER — Encounter: Payer: Commercial Managed Care - PPO | Admitting: Physician Assistant

## 2023-06-09 ENCOUNTER — Other Ambulatory Visit (HOSPITAL_COMMUNITY): Payer: Self-pay | Admitting: Psychiatry

## 2023-06-09 ENCOUNTER — Other Ambulatory Visit (HOSPITAL_BASED_OUTPATIENT_CLINIC_OR_DEPARTMENT_OTHER): Payer: Self-pay

## 2023-06-09 DIAGNOSIS — F410 Panic disorder [episodic paroxysmal anxiety] without agoraphobia: Secondary | ICD-10-CM

## 2023-06-09 DIAGNOSIS — F411 Generalized anxiety disorder: Secondary | ICD-10-CM

## 2023-06-09 MED ORDER — ALPRAZOLAM 0.5 MG PO TABS
0.5000 mg | ORAL_TABLET | Freq: Every day | ORAL | 0 refills | Status: DC | PRN
Start: 2023-06-09 — End: 2023-07-13
  Filled 2023-06-09: qty 30, 30d supply, fill #0

## 2023-06-17 ENCOUNTER — Ambulatory Visit (INDEPENDENT_AMBULATORY_CARE_PROVIDER_SITE_OTHER): Payer: Commercial Managed Care - PPO | Admitting: Physician Assistant

## 2023-06-17 ENCOUNTER — Encounter: Payer: Self-pay | Admitting: Physician Assistant

## 2023-06-17 VITALS — BP 124/79 | HR 73 | Resp 20 | Ht 65.0 in | Wt 173.1 lb

## 2023-06-17 DIAGNOSIS — Z23 Encounter for immunization: Secondary | ICD-10-CM | POA: Diagnosis not present

## 2023-06-17 DIAGNOSIS — E559 Vitamin D deficiency, unspecified: Secondary | ICD-10-CM

## 2023-06-17 DIAGNOSIS — Z1231 Encounter for screening mammogram for malignant neoplasm of breast: Secondary | ICD-10-CM

## 2023-06-17 DIAGNOSIS — Z1159 Encounter for screening for other viral diseases: Secondary | ICD-10-CM | POA: Diagnosis not present

## 2023-06-17 DIAGNOSIS — E785 Hyperlipidemia, unspecified: Secondary | ICD-10-CM

## 2023-06-17 DIAGNOSIS — E1121 Type 2 diabetes mellitus with diabetic nephropathy: Secondary | ICD-10-CM

## 2023-06-17 DIAGNOSIS — Z7984 Long term (current) use of oral hypoglycemic drugs: Secondary | ICD-10-CM

## 2023-06-17 DIAGNOSIS — Z0000 Encounter for general adult medical examination without abnormal findings: Secondary | ICD-10-CM

## 2023-06-17 LAB — POCT GLYCOSYLATED HEMOGLOBIN (HGB A1C)
HbA1c, POC (controlled diabetic range): 5.2 % (ref 0.0–7.0)
Hemoglobin A1C: 5.2 % (ref 4.0–5.6)

## 2023-06-17 LAB — POCT UA - MICROALBUMIN
Creatinine, POC: 50 mg/dL
Microalbumin Ur, POC: 30 mg/L

## 2023-06-17 NOTE — Patient Instructions (Signed)

## 2023-06-19 ENCOUNTER — Encounter: Payer: Self-pay | Admitting: Physician Assistant

## 2023-06-19 NOTE — Progress Notes (Signed)
Complete physical exam  Patient: Shannon [name]   DOB: 1966/08/05   57 y.o. Female  MRN: [id number]  Subjective:    Chief Complaint  Patient presents with   Annual Exam    Shannon [name] is a 57 y.o. female who presents today for a complete physical exam. She reports consuming a general diet. The patient has a physically strenuous job, but has no regular exercise apart from work.  She generally feels well. She reports sleeping well. She does not have additional problems to discuss today.    Most recent fall risk assessment:    03/18/2023   10:59 AM  Fall Risk   Falls in the past year? 0  Number falls in past yr: 0  Injury with Fall? 0  Risk for fall due to : No Fall Risks  Follow up Falls evaluation completed     Most recent depression screenings:    03/18/2023   10:59 AM 11/17/2022   10:18 AM  PHQ 2/9 Scores  PHQ - 2 Score 2 2  PHQ- 9 Score 9 10    Vision:Within last year and Dental: No current dental problems and Receives regular dental care  Patient Active Problem List   Diagnosis Date Noted   Major depressive disorder, recurrent episode, moderate (HCC) 11/17/2022   Class 1 obesity due to excess calories with serious comorbidity and body mass index (BMI) of 30.0 to 30.9 in adult 11/17/2022   Abnormal uterine bleeding (AUB) 10/29/2022   Panic disorder 10/15/2022   Functional diarrhea 06/04/2022   Menopausal symptoms 10/15/2021   Hematuria 06/18/2021   Unilateral primary osteoarthritis, right knee 02/06/2021   Vitamin D deficiency 11/27/2020   Digital mucinous cyst of right thumb 10/23/2020   Family history of leukemia 08/14/2020   SOB (shortness of breath) on exertion 08/14/2020   Bilateral lower extremity edema 08/14/2020   Elevated platelet count 08/14/2020   Fatigue 08/13/2020   OAB (overactive bladder) 11/23/2019   Loss of balance 05/24/2019   Vertigo 05/24/2019   Primary osteoarthritis of right knee 10/06/2018   BPPV (benign paroxysmal positional vertigo), right  05/09/2018   Fibroids 10/30/2016   Cervical myofascial pain syndrome 07/31/2016   Hiatal hernia 08/13/2015   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   Generalized anxiety disorder 03/16/2015   DDD (degenerative disc disease), lumbar 08/28/2014   Hypothyroidism 08/15/2014   Type II diabetes mellitus with nephropathy (HCC) 08/15/2014   OSA on CPAP 03/31/2014   Class 2 severe obesity due to excess calories with serious comorbidity and body mass index (BMI) of 37.0 to 37.9 in adult (HCC) 03/31/2014   Sleep apnea 02/05/2012   Mood disorder (HCC) 02/05/2012   Hyperlipidemia LDL goal <70 11/22/2008   Past Medical History:  Diagnosis Date   ADHD (attention deficit hyperactivity disorder)    Anxiety    Arthritis    back and hips    Depression    Diabetes mellitus type II, controlled (HCC) 08/15/2014   GERD (gastroesophageal reflux disease)    HPV (human papilloma virus) infection    Hyperlipidemia    Lateral meniscus tear    left knee   Menopausal state    PONV (postoperative nausea and vomiting)    slow to wake up    Pre-diabetes    Shortness of breath dyspnea    with exertion    Sleep apnea    cpap -0 setting at 16, uses CPAP nightly   Stress incontinence    Past Surgical History:  Procedure  Laterality Date   APPENDECTOMY     BREATH TEK H PYLORI N/A 04/03/2015   Procedure: BREATH TEK H PYLORI;  Surgeon: Gaynelle Adu, MD;  Location: [facility];  Service: General;  Laterality: N/A;   CESAREAN SECTION     DILITATION & CURRETTAGE/HYSTROSCOPY WITH NOVASURE ABLATION N/A 10/29/2022   Procedure: DILATATION & CURETTAGE/HYSTEROSCOPY WITH MYOSURE AND NOVASURE ABLATION;  Surgeon: Milas Hock, MD;  Location: [facility] [place];  Service: Gynecology;  Laterality: N/A;   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/04/1999   second interspace, right foot   EXCISION MORTON'S NEUROMA Left 04/04/1999   second interspace, left foot   feet surgery     KNEE ARTHROSCOPY Right 03/17/2019    Procedure: RIGHT KNEE ARTHROSCOPY WITH PARTIAL LATERAL MENISCECTOMY, REMOVAL OF LOOSE BODIES, 3 COMPARTMENT CHONDROPLASTY;  Surgeon: Kathryne Hitch, MD;  Location: [facility];  Service: Orthopedics;  Laterality: Right;   LAPAROSCOPIC GASTRIC SLEEVE RESECTION WITH HIATAL HERNIA REPAIR N/A 08/13/2015   Procedure: LAPAROSCOPIC GASTRIC SLEEVE RESECTION WITH HIATAL HERNIA REPAIR;  Surgeon: Gaynelle Adu, MD;  Location: [facility];  Service: General;  Laterality: N/A;   laproscopy     UPPER GI ENDOSCOPY  08/13/2015   Procedure: UPPER GI ENDOSCOPY;  Surgeon: Gaynelle Adu, MD;  Location: [facility];  Service: General;;   WISDOM TOOTH EXTRACTION     Family History  Problem Relation Age of Onset   Hyperlipidemia Mother    Sleep apnea Mother    Depression Mother    Kidney disease Mother    Diabetes Father    Hypertension Father    COPD Father    Depression Sister    Alcohol abuse Paternal Grandfather    Diabetes Paternal Aunt    Diabetes Paternal Uncle    Alcohol abuse Paternal Uncle    Leukemia Paternal Uncle    Allergies  Allergen Reactions   Rosuvastatin     MUSCLE ACHES   Food Swelling    Big Red Gum makes her tongue swell   Latuda [Lurasidone Hcl] Other (See Comments)    Severe aggitation   Lipitor [Atorvastatin] Other (See Comments)    Muscle aches   Atorvastatin Calcium    Belviq [Lorcaserin Hcl]     Increased appetitie   Contrave [Naltrexone-Bupropion Hcl Er] Other (See Comments)    Sleepy.      Patient Care Team: Nolene Ebbs as PCP - General (Family Medicine)   Outpatient Medications Prior to Visit  Medication Sig   acetaminophen (TYLENOL) 500 MG tablet Take 1 tablet (500 mg total) by mouth every 6 (six) hours as needed.   ALPRAZolam (XANAX) 0.5 MG tablet Take 1 tablet (0.5 mg total) by mouth daily as needed.   Blood Glucose Monitoring Suppl (FREESTYLE LITE) w/Device KIT USE AS DIRECTED TO TEST BLOOD SUGAR 4 TIMES A DAY AS DIRECTED    citalopram (CELEXA) 40 MG tablet Take 1 tablet (40 mg total) by mouth daily.   ferrous sulfate 325 (65 FE) MG tablet Take 325 mg by mouth daily with breakfast.   glucose blood (FREESTYLE LITE) test strip Use as directed to test blood sugar 4 times daily   ibuprofen (ADVIL) 800 MG tablet Take 1 tablet (800 mg total) by mouth 3 (three) times daily with meals as needed for headache, moderate pain or cramping.   lamoTRIgine (LAMICTAL) 200 MG tablet Take 1 tablet (200 mg total) by mouth daily.   lamoTRIgine (LAMICTAL) 25 MG tablet Take 2 tablets (50 mg total) by mouth daily.  Adding 50mg  a day in addition to 200mg    Lancets (FREESTYLE) lancets Use as directed to test blood sugar 4 times daily   mirabegron ER (MYRBETRIQ) 50 MG TB24 tablet Take 1 tablet (50 mg total) by mouth daily.   pregabalin (LYRICA) 100 MG capsule Take 1 capsule (100 mg total) by mouth 2 (two) times daily.   Semaglutide, 2 MG/DOSE, (OZEMPIC, 2 MG/DOSE,) 8 MG/3ML SOPN Inject 2 mg into the skin once a week.   Vilazodone HCl (VIIBRYD) 10 MG TABS Take 1 tablet (10 mg total) by mouth daily.   Vitamin D, Ergocalciferol, (DRISDOL) 1.25 MG (50000 UNIT) CAPS capsule Take 1 capsule (50,000 Units total) by mouth every 7 (seven) days.   No facility-administered medications prior to visit.    Review of Systems  All other systems reviewed and are negative.         Objective:     BP 124/79   Pulse 73   Resp 20   Ht 5\' 5"  (1.651 m)   Wt 173 lb 1.3 oz (78.5 kg)   LMP 12/17/2016 (Exact Date)   SpO2 96%   BMI 28.80 kg/m  BP Readings from Last 3 Encounters:  06/17/23 124/79  03/18/23 109/67  11/17/22 126/79   Wt Readings from Last 3 Encounters:  06/17/23 173 lb 1.3 oz (78.5 kg)  03/18/23 172 lb 1.6 oz (78.1 kg)  11/17/22 184 lb (83.5 kg)      Physical Exam  BP 124/79   Pulse 73   Resp 20   Ht 5\' 5"  (1.651 m)   Wt 173 lb 1.3 oz (78.5 kg)   LMP 12/17/2016 (Exact Date)   SpO2 96%   BMI 28.80 kg/m   General  Appearance:    Alert, cooperative, no distress, appears stated age  Head:    Normocephalic, without obvious abnormality, atraumatic  Eyes:    PERRL, conjunctiva/corneas clear, EOM's intact, fundi    benign, both eyes  Ears:    Normal TM's and external ear canals, both ears  Nose:   Nares normal, septum midline, mucosa normal, no drainage    or sinus tenderness  Throat:   Lips, mucosa, and tongue normal; teeth and gums normal  Neck:   Supple, symmetrical, trachea midline, no adenopathy;    thyroid:  no enlargement/tenderness/nodules; no carotid   bruit or JVD  Back:     Symmetric, no curvature, ROM normal, no CVA tenderness  Lungs:     Clear to auscultation bilaterally, respirations unlabored  Chest Wall:    No tenderness or deformity   Heart:    Regular rate and rhythm, S1 and S2 normal, no murmur, rub   or gallop     Abdomen:     Soft, non-tender, bowel sounds active all four quadrants,    no masses, no organomegaly        Extremities:   Extremities normal, atraumatic, no cyanosis or edema  Pulses:   2+ and symmetric all extremities  Skin:   Skin color, texture, turgor normal, no rashes or lesions  Lymph nodes:   Cervical, supraclavicular, and axillary nodes normal  Neurologic:   CNII-XII intact, normal strength, sensation and reflexes    throughout    Results for orders placed or performed in visit on 06/17/23  POCT HgB A1C  Result Value Ref Range   Hemoglobin A1C 5.2 4.0 - 5.6 %   HbA1c POC (<> result, manual entry)     HbA1c, POC (prediabetic range)     HbA1c,  POC (controlled diabetic range) 5.2 0.0 - 7.0 %  POCT UA - Microalbumin  Result Value Ref Range   Microalbumin Ur, POC 30 mg/L   Creatinine, POC 50 mg/dL   Albumin/Creatinine Ratio, Urine, POC 30-300    .Marland Kitchen Diabetic Foot Exam - Simple   Simple Foot Form Visual Inspection No deformities, no ulcerations, no other skin breakdown bilaterally: Yes Sensation Testing Intact to touch and monofilament testing  bilaterally: Yes Pulse Check Posterior Tibialis and Dorsalis pulse intact bilaterally: Yes Comments      Assessment & Plan:    Routine Health Maintenance and Physical Exam  Immunization History  Administered Date(s) Administered   Influenza, Seasonal, Injecte, Preservative Fre 06/17/2023   Influenza,inj,Quad PF,6+ Mos 07/08/2017, 07/21/2018, 06/18/2021, 08/14/2022   PFIZER(Purple Top)SARS-COV-2 Vaccination 11/08/2019, 11/28/2019   Pneumococcal Polysaccharide-23 12/29/2014   Tdap 01/07/2013, 06/17/2023   Zoster Recombinant(Shingrix) 06/18/2021, 09/18/2021    Health Maintenance  Topic Date Due   Hepatitis C Screening  Never done   MAMMOGRAM  03/21/2023   Colonoscopy  05/14/2023   Diabetic kidney evaluation - eGFR measurement  06/17/2023   FOOT EXAM  06/05/2023   COVID-19 Vaccine (3 - Pfizer risk series) 07/03/2023 (Originally 12/26/2019)   OPHTHALMOLOGY EXAM  11/06/2023   PAP SMEAR-Modifier  11/29/2023   HEMOGLOBIN A1C  12/18/2023   Diabetic kidney evaluation - Urine ACR  06/16/2024   DTaP/Tdap/Td (3 - Td or Tdap) 06/16/2033   INFLUENZA VACCINE  Completed   HIV Screening  Completed   Zoster Vaccines- Shingrix  Completed   HPV VACCINES  Aged Out    Discussed health benefits of physical activity, and encouraged her to engage in regular exercise appropriate for her age and condition. Marland Kitchen.Shannon [name] was seen today for annual exam.  Diagnoses and all orders for this visit:  Routine physical examination -     TSH -     Lipid panel -     CBC with Differential/Platelet -     CMP14+EGFR -     MM 3D SCREENING MAMMOGRAM BILATERAL BREAST -     VITAMIN D 25 Hydroxy (Vit-D Deficiency, Fractures) -     Hepatitis C antibody  Need for influenza vaccination -     Flu vaccine trivalent PF, 6mos and older(Flulaval,Afluria,Fluarix,Fluzone)  Type II diabetes mellitus with nephropathy (HCC) -     Lipid panel -     CMP14+EGFR -     POCT HgB A1C -     POCT UA - Microalbumin  Encounter for  hepatitis C screening test for low risk patient -     Hepatitis C antibody  Vitamin D deficiency -     VITAMIN D 25 Hydroxy (Vit-D Deficiency, Fractures)  Visit for screening mammogram -     MM 3D SCREENING MAMMOGRAM BILATERAL BREAST  Hyperlipidemia LDL goal <70 -     Lipid panel  Need for tetanus booster -     Tdap vaccine greater than or equal to 7yo IM   .Marland Kitchen Discussed 150 minutes of exercise a week.  Encouraged vitamin D 1000 units and Calcium 1300mg  or 4 servings of dairy a day.  Fasting labs ordered PHQ no concerns Mammogram ordered Pt agrees to schedule 5 year follow up colonoscopy Pap not needed due to hysterectomy Vitals look great A1C wonderful Stay on same medications Foot and eye exam UTD On statin, lipid ordered BP to goal Borderline microalbumin, ordered CMP Consider SGLT-2 or AcE/ARB in the future Tdap and flu shot given today Declined covid booster  Return in about 3 months (around 09/17/2023) for DM follow up.     Tandy Gaw, PA-C

## 2023-06-22 DIAGNOSIS — G4733 Obstructive sleep apnea (adult) (pediatric): Secondary | ICD-10-CM | POA: Diagnosis not present

## 2023-06-23 IMAGING — US US PELVIS COMPLETE WITH TRANSVAGINAL
1 series · 13 of 25 positions shown · non-contrast
Comparison: Ultrasound 11/07/2021

CLINICAL DATA: IUD placement

EXAM:
TRANSABDOMINAL AND TRANSVAGINAL ULTRASOUND OF PELVIS
TECHNIQUE: Both transabdominal and transvaginal ultrasound examinations of the
pelvis were performed. Transabdominal technique was performed for
global imaging of the pelvis including uterus, ovaries, adnexal
regions, and pelvic cul-de-sac. It was necessary to proceed with
endovaginal exam following the transabdominal exam to visualize the
uterus endometrium ovaries.

[Series 1: us pelvic complete with transvaginal · 13 of 102 slices shown]
[im 1/102]
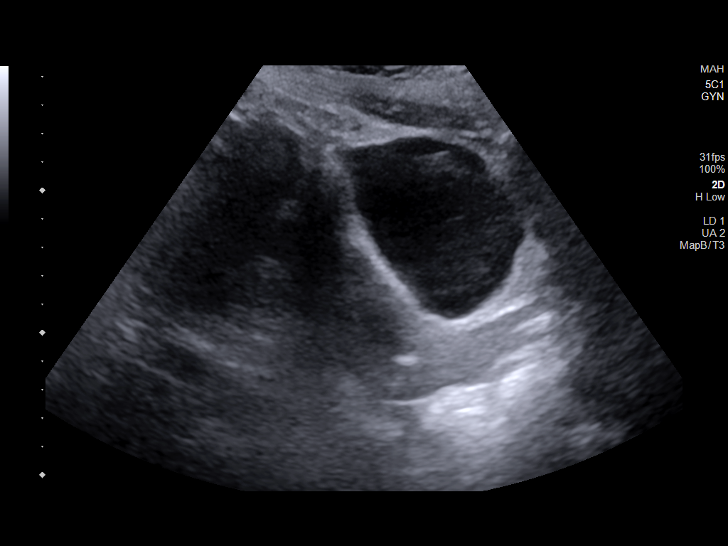
[im 9/102]
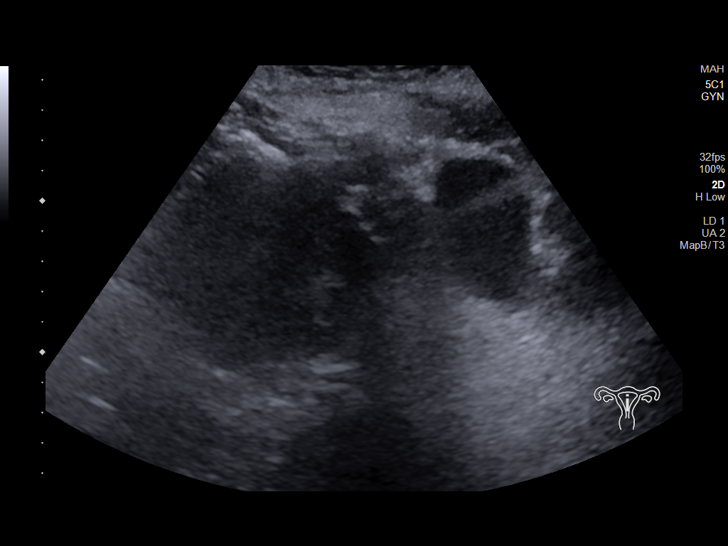
[im 17/102]
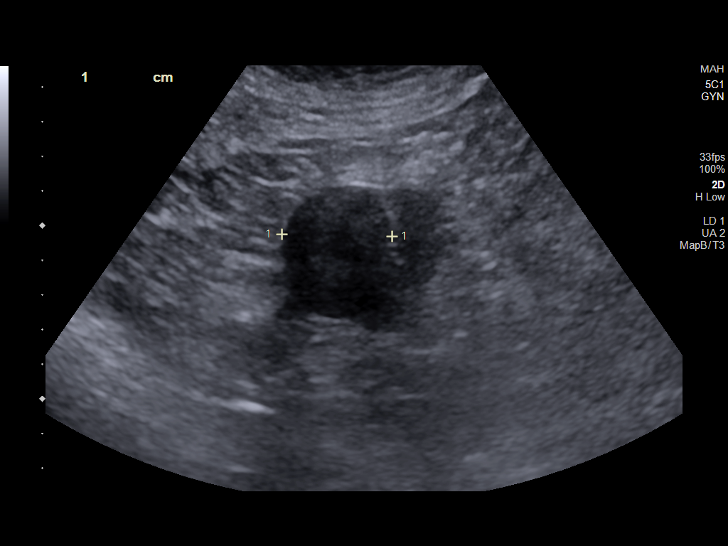
[im 26/102]
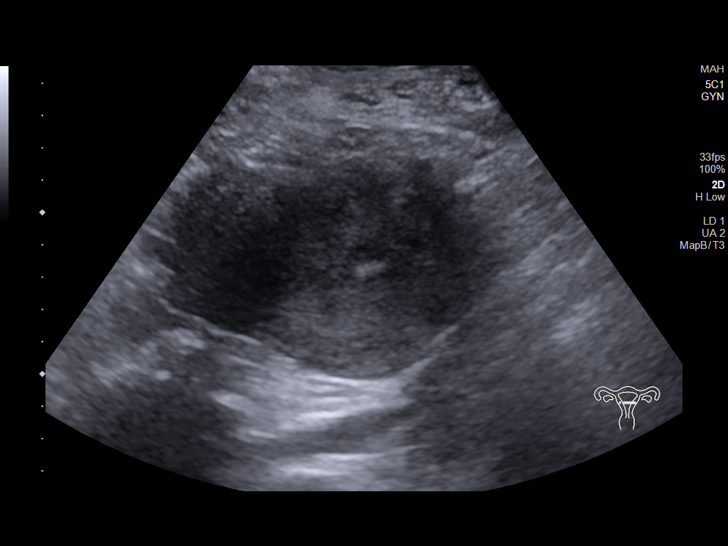
[im 34/102]
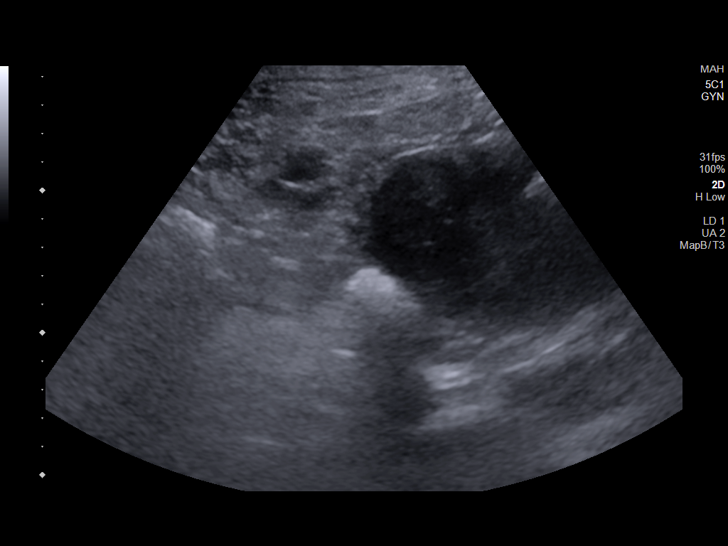
[im 43/102]
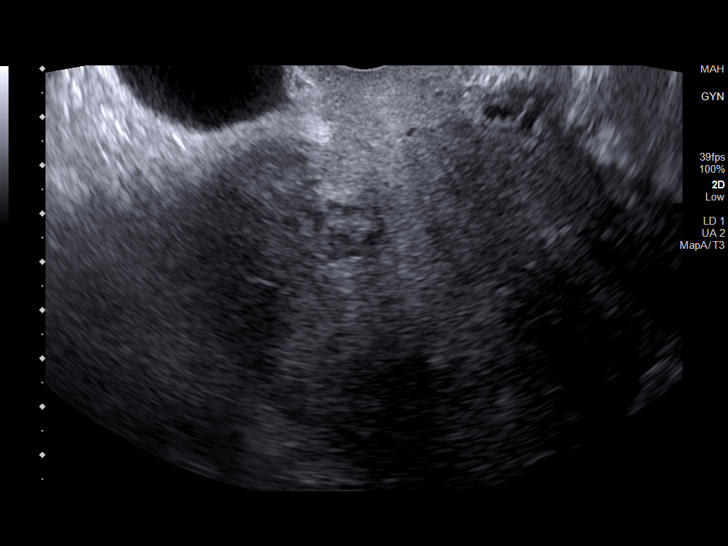
[im 51/102]
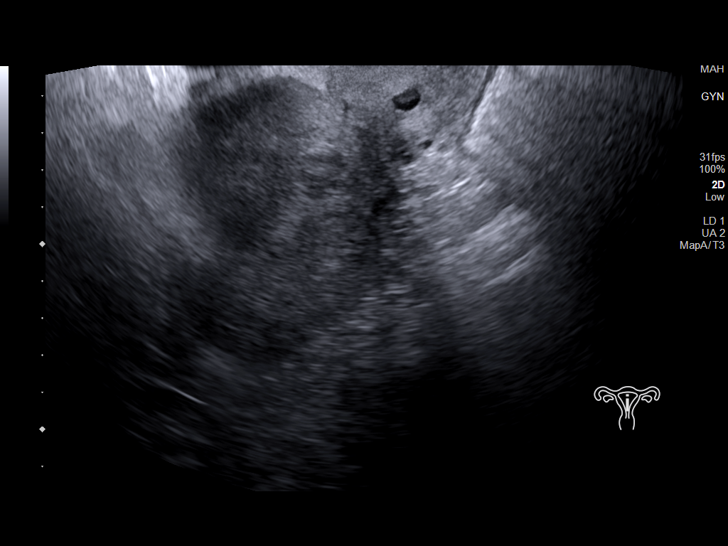
[im 59/102]
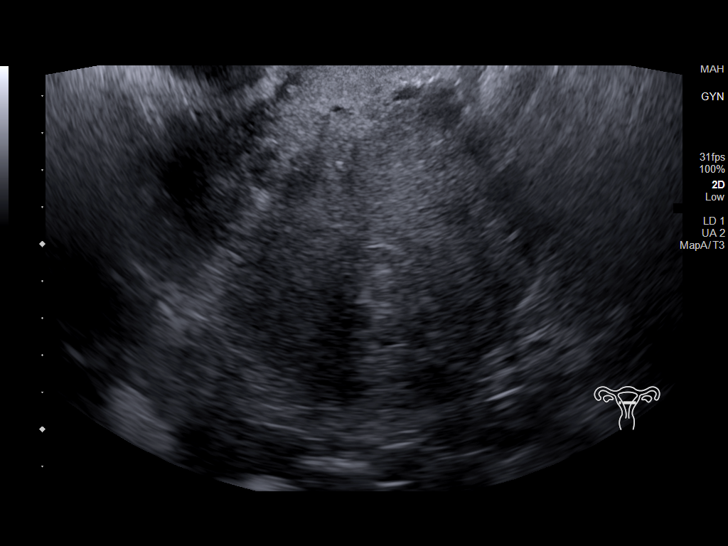
[im 68/102]
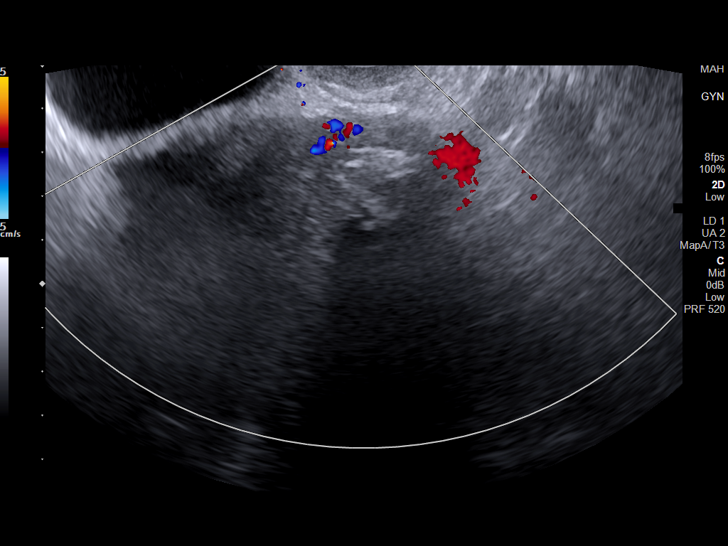
[im 76/102]
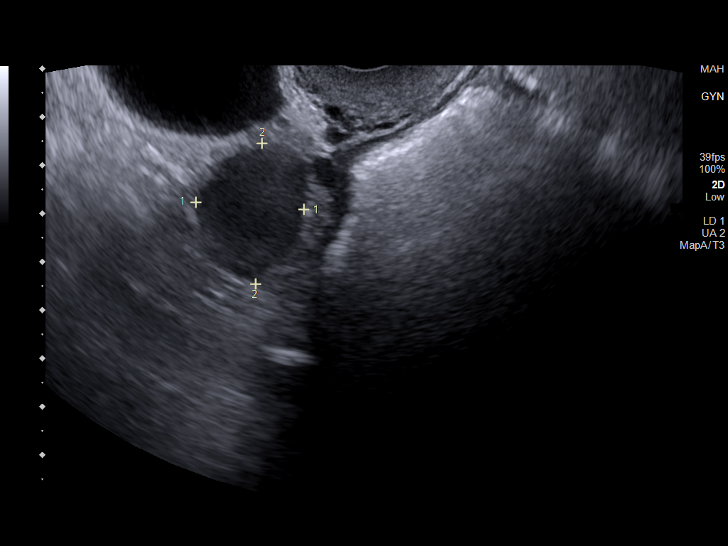
[im 85/102]
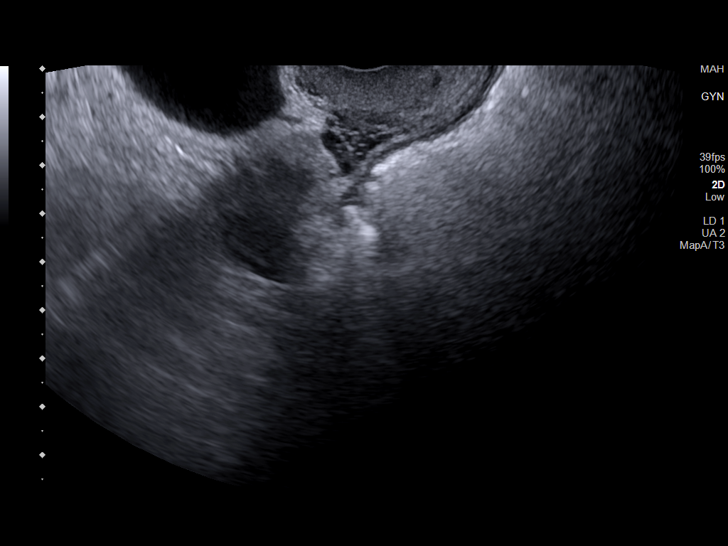
[im 93/102]
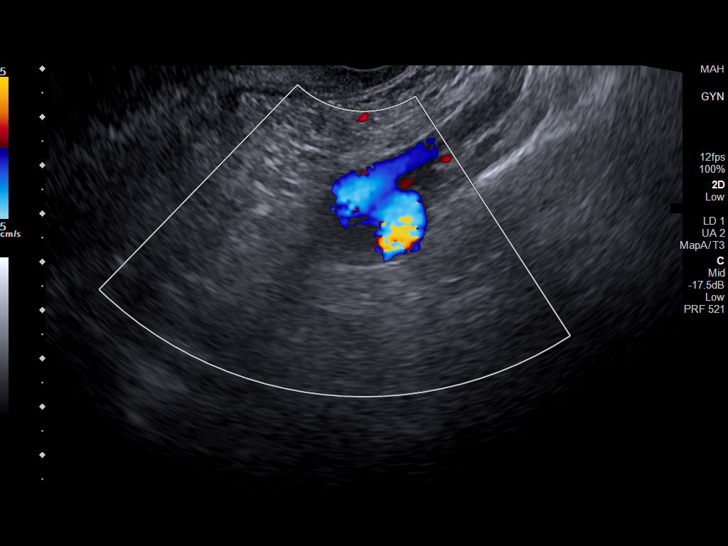
[im 102/102]
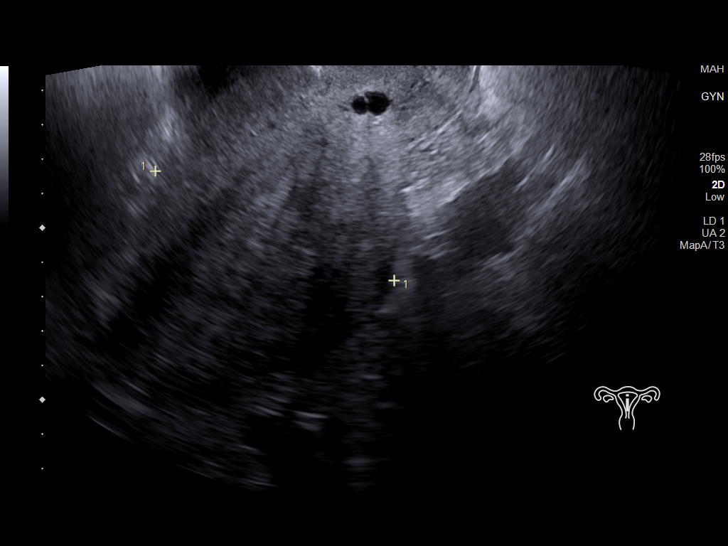

[13 of 25 positions shown; findings below may reference images not displayed]

FINDINGS: Uterus

Measurements: 12.3 x 7.2 x 9.6 cm = volume: 445.2 mL. Multiple
uterine fibroids. Partially calcified exophytic left lower uterine
segment fibroid measuring 4.2 x 3.2 by 4.1 cm. Exophytic right
uterine fundus fibroid measuring 4.8 x 4.5 x 4 cm. Multiple
additional calcified and noncalcified uterine fibroids.

Endometrium

Thickness: 16.6 mm. IUD visualized within the lower uterine segment
and cervix

Right ovary

Measurements: 3.6 x 3.1 x 2.7 cm = volume: 15.7 mL. Cyst measuring
2.2 x 2.9 by 2.1 cm containing internal low level echoes.

Left ovary

Not seen

Other findings

Trace free fluid
IMPRESSION: 1. Enlarged uterus with multiple fibroids. IUD is low lying and
visualized within the lower uterine segment and cervix
2. Endometrial thickness of 16.6 mm. Endometrial thickness is
considered abnormal for an asymptomatic post-menopausal female.
Endometrial sampling should be considered to exclude carcinoma.
3. 2.9 cm complex right ovarian cyst, questionable for hemorrhagic
cyst. Recommend 6-12 week sonographic follow-up.

## 2023-06-24 ENCOUNTER — Other Ambulatory Visit (HOSPITAL_COMMUNITY): Payer: Self-pay | Admitting: Psychiatry

## 2023-06-24 ENCOUNTER — Other Ambulatory Visit: Payer: Self-pay

## 2023-06-24 ENCOUNTER — Other Ambulatory Visit (HOSPITAL_BASED_OUTPATIENT_CLINIC_OR_DEPARTMENT_OTHER): Payer: Self-pay

## 2023-06-24 DIAGNOSIS — F331 Major depressive disorder, recurrent, moderate: Secondary | ICD-10-CM

## 2023-06-24 MED ORDER — CITALOPRAM HYDROBROMIDE 40 MG PO TABS
40.0000 mg | ORAL_TABLET | Freq: Every day | ORAL | 2 refills | Status: DC
Start: 2023-06-24 — End: 2023-09-09
  Filled 2023-06-24: qty 30, 30d supply, fill #0
  Filled 2023-08-03: qty 30, 30d supply, fill #1
  Filled 2023-09-01: qty 30, 30d supply, fill #2

## 2023-06-24 MED ORDER — LAMOTRIGINE 25 MG PO TABS
50.0000 mg | ORAL_TABLET | Freq: Every day | ORAL | 0 refills | Status: DC
  Filled 2023-06-24: qty 60, 30d supply, fill #0

## 2023-06-24 MED ORDER — LAMOTRIGINE 200 MG PO TABS
200.0000 mg | ORAL_TABLET | Freq: Every day | ORAL | 0 refills | Status: DC
  Filled 2023-06-24: qty 30, 30d supply, fill #0

## 2023-07-07 ENCOUNTER — Other Ambulatory Visit (HOSPITAL_COMMUNITY): Payer: Self-pay | Admitting: Psychiatry

## 2023-07-07 ENCOUNTER — Other Ambulatory Visit: Payer: Self-pay | Admitting: Physician Assistant

## 2023-07-07 DIAGNOSIS — N3281 Overactive bladder: Secondary | ICD-10-CM

## 2023-07-08 ENCOUNTER — Other Ambulatory Visit: Payer: Self-pay

## 2023-07-08 ENCOUNTER — Other Ambulatory Visit (HOSPITAL_BASED_OUTPATIENT_CLINIC_OR_DEPARTMENT_OTHER): Payer: Self-pay

## 2023-07-08 MED ORDER — VILAZODONE HCL 10 MG PO TABS
10.0000 mg | ORAL_TABLET | Freq: Every day | ORAL | 1 refills | Status: DC
  Filled 2023-07-08: qty 30, 30d supply, fill #0
  Filled 2023-08-13: qty 30, 30d supply, fill #1

## 2023-07-10 ENCOUNTER — Telehealth: Payer: Self-pay | Admitting: Family Medicine

## 2023-07-10 NOTE — Telephone Encounter (Signed)
Please call pt and let her know we haven't see her labs yet. Please encourage her to go at her earliest convenience.

## 2023-07-13 ENCOUNTER — Other Ambulatory Visit (HOSPITAL_COMMUNITY): Payer: Self-pay | Admitting: Psychiatry

## 2023-07-13 DIAGNOSIS — F410 Panic disorder [episodic paroxysmal anxiety] without agoraphobia: Secondary | ICD-10-CM

## 2023-07-13 DIAGNOSIS — F411 Generalized anxiety disorder: Secondary | ICD-10-CM

## 2023-07-13 MED ORDER — ALPRAZOLAM 0.5 MG PO TABS
0.5000 mg | ORAL_TABLET | Freq: Every day | ORAL | 0 refills | Status: DC | PRN
Start: 2023-07-13 — End: 2023-08-17
  Filled 2023-07-13: qty 30, 30d supply, fill #0

## 2023-07-14 ENCOUNTER — Other Ambulatory Visit (HOSPITAL_BASED_OUTPATIENT_CLINIC_OR_DEPARTMENT_OTHER): Payer: Self-pay

## 2023-07-14 ENCOUNTER — Other Ambulatory Visit: Payer: Self-pay

## 2023-07-15 ENCOUNTER — Other Ambulatory Visit (HOSPITAL_BASED_OUTPATIENT_CLINIC_OR_DEPARTMENT_OTHER): Payer: Self-pay

## 2023-07-15 MED ORDER — MIRABEGRON ER 50 MG PO TB24
50.0000 mg | ORAL_TABLET | Freq: Every day | ORAL | 3 refills | Status: DC
  Filled 2023-07-15 (×2): qty 90, 90d supply, fill #0
  Filled 2023-10-12: qty 90, 90d supply, fill #1
  Filled 2024-01-19: qty 90, 90d supply, fill #2
  Filled 2024-04-29: qty 90, 90d supply, fill #3

## 2023-07-16 ENCOUNTER — Encounter: Payer: Self-pay | Admitting: Family Medicine

## 2023-07-16 ENCOUNTER — Ambulatory Visit: Payer: Commercial Managed Care - PPO

## 2023-07-16 ENCOUNTER — Other Ambulatory Visit (HOSPITAL_BASED_OUTPATIENT_CLINIC_OR_DEPARTMENT_OTHER): Payer: Self-pay

## 2023-07-16 ENCOUNTER — Ambulatory Visit: Payer: Commercial Managed Care - PPO | Admitting: Family Medicine

## 2023-07-16 VITALS — BP 131/79 | HR 79 | Temp 98.4°F | Ht 65.0 in | Wt 172.0 lb

## 2023-07-16 DIAGNOSIS — H10023 Other mucopurulent conjunctivitis, bilateral: Secondary | ICD-10-CM | POA: Diagnosis not present

## 2023-07-16 MED ORDER — ERYTHROMYCIN 5 MG/GM OP OINT
1.0000 | TOPICAL_OINTMENT | Freq: Three times a day (TID) | OPHTHALMIC | 1 refills | Status: DC
  Filled 2023-07-16: qty 3.5, 1d supply, fill #0

## 2023-07-16 NOTE — Progress Notes (Signed)
Acute Office Visit  Subjective:     Patient ID: Shannon [name], female    DOB: 05-25-1966, 57 y.o.   MRN: [id number]  Chief Complaint  Patient presents with   Eye Problem    HPI Patient is in today for bilateral pinkeye.  She said she woke up with it yesterday they just feel really irritated it is affecting both eyes she has not made any changes such as make-up etc. she says in fact she really wears that she is wearing her glasses today.  She has not had any vision change or significant pain is just more irritating than anything is also crusting over in the mornings.  ROS      Objective:    BP 131/79   Pulse 79   Temp 98.4 F (36.9 C) (Oral)   Ht 5\' 5"  (1.651 m)   Wt 172 lb 0.6 oz (78 kg)   LMP 12/17/2016 (Exact Date)   SpO2 99%   BMI 28.63 kg/m    Physical Exam Vitals reviewed.  Constitutional:      Appearance: Normal appearance.  HENT:     Head: Normocephalic.  Eyes:     Extraocular Movements: Extraocular movements intact.     Pupils: Pupils are equal, round, and reactive to light.     Comments: The conjunctival is injected as well as the sclera.  No significant lid edema.  Pulmonary:     Effort: Pulmonary effort is normal.  Neurological:     Mental Status: She is alert and oriented to person, place, and time.  Psychiatric:        Mood and Affect: Mood normal.        Behavior: Behavior normal.     No results found for any visits on 07/16/23.      Assessment & Plan:   Problem List Items Addressed This Visit   None Visit Diagnoses     Pink eye disease of both eyes    -  Primary      Gust most likely viral but she does work in healthcare settings were going to go ahead and treat with erythromycin ophthalmic ointment.  Call if any problems or concerns avoid touching the eyes.  No vision changes so did not do vision testing today.  Meds ordered this encounter  Medications   erythromycin ophthalmic ointment    Sig: Place 1 Application into both eyes 3  (three) times daily.    Dispense:  3.5 g    Refill:  1    No follow-ups on file.  Nani Gasser, MD

## 2023-07-21 ENCOUNTER — Other Ambulatory Visit: Payer: Self-pay

## 2023-07-21 ENCOUNTER — Other Ambulatory Visit (HOSPITAL_BASED_OUTPATIENT_CLINIC_OR_DEPARTMENT_OTHER): Payer: Self-pay

## 2023-07-21 MED ORDER — NA SULFATE-K SULFATE-MG SULF 17.5-3.13-1.6 GM/177ML PO SOLN
ORAL | 0 refills | Status: DC
  Filled 2023-07-21: qty 354, 2d supply, fill #0

## 2023-07-22 ENCOUNTER — Other Ambulatory Visit (HOSPITAL_COMMUNITY): Payer: Self-pay | Admitting: Psychiatry

## 2023-07-23 ENCOUNTER — Other Ambulatory Visit (HOSPITAL_BASED_OUTPATIENT_CLINIC_OR_DEPARTMENT_OTHER): Payer: Self-pay

## 2023-07-23 MED ORDER — LAMOTRIGINE 25 MG PO TABS
50.0000 mg | ORAL_TABLET | Freq: Every day | ORAL | 0 refills | Status: DC
  Filled 2023-07-23: qty 60, 30d supply, fill #0

## 2023-07-23 MED ORDER — LAMOTRIGINE 200 MG PO TABS
200.0000 mg | ORAL_TABLET | Freq: Every day | ORAL | 0 refills | Status: DC
  Filled 2023-07-23: qty 30, 30d supply, fill #0

## 2023-07-31 DIAGNOSIS — K635 Polyp of colon: Secondary | ICD-10-CM | POA: Diagnosis not present

## 2023-07-31 DIAGNOSIS — Z1211 Encounter for screening for malignant neoplasm of colon: Secondary | ICD-10-CM | POA: Diagnosis not present

## 2023-07-31 DIAGNOSIS — G4733 Obstructive sleep apnea (adult) (pediatric): Secondary | ICD-10-CM | POA: Diagnosis not present

## 2023-07-31 DIAGNOSIS — D123 Benign neoplasm of transverse colon: Secondary | ICD-10-CM | POA: Diagnosis not present

## 2023-07-31 DIAGNOSIS — Z860101 Personal history of adenomatous and serrated colon polyps: Secondary | ICD-10-CM | POA: Diagnosis not present

## 2023-07-31 DIAGNOSIS — Z09 Encounter for follow-up examination after completed treatment for conditions other than malignant neoplasm: Secondary | ICD-10-CM | POA: Diagnosis not present

## 2023-08-17 ENCOUNTER — Other Ambulatory Visit (HOSPITAL_COMMUNITY): Payer: Self-pay | Admitting: Psychiatry

## 2023-08-17 DIAGNOSIS — F410 Panic disorder [episodic paroxysmal anxiety] without agoraphobia: Secondary | ICD-10-CM

## 2023-08-17 DIAGNOSIS — F411 Generalized anxiety disorder: Secondary | ICD-10-CM

## 2023-08-18 ENCOUNTER — Other Ambulatory Visit: Payer: Self-pay

## 2023-08-18 ENCOUNTER — Other Ambulatory Visit (HOSPITAL_BASED_OUTPATIENT_CLINIC_OR_DEPARTMENT_OTHER): Payer: Self-pay

## 2023-08-18 MED ORDER — ALPRAZOLAM 0.5 MG PO TABS
0.5000 mg | ORAL_TABLET | Freq: Every day | ORAL | 0 refills | Status: DC | PRN
  Filled 2023-08-18: qty 30, 30d supply, fill #0

## 2023-08-19 DIAGNOSIS — G4733 Obstructive sleep apnea (adult) (pediatric): Secondary | ICD-10-CM | POA: Diagnosis not present

## 2023-08-25 ENCOUNTER — Other Ambulatory Visit (HOSPITAL_COMMUNITY): Payer: Self-pay | Admitting: Psychiatry

## 2023-08-25 MED ORDER — LAMOTRIGINE 25 MG PO TABS
50.0000 mg | ORAL_TABLET | Freq: Every day | ORAL | 0 refills | Status: DC
  Filled 2023-08-25: qty 60, 30d supply, fill #0

## 2023-08-25 MED ORDER — LAMOTRIGINE 200 MG PO TABS
200.0000 mg | ORAL_TABLET | Freq: Every day | ORAL | 0 refills | Status: DC
  Filled 2023-08-25: qty 30, 30d supply, fill #0

## 2023-08-26 ENCOUNTER — Other Ambulatory Visit: Payer: Self-pay

## 2023-08-26 ENCOUNTER — Other Ambulatory Visit (HOSPITAL_BASED_OUTPATIENT_CLINIC_OR_DEPARTMENT_OTHER): Payer: Self-pay

## 2023-09-01 ENCOUNTER — Other Ambulatory Visit (HOSPITAL_BASED_OUTPATIENT_CLINIC_OR_DEPARTMENT_OTHER): Payer: Self-pay

## 2023-09-01 ENCOUNTER — Other Ambulatory Visit: Payer: Self-pay | Admitting: Physician Assistant

## 2023-09-01 ENCOUNTER — Other Ambulatory Visit: Payer: Self-pay

## 2023-09-01 DIAGNOSIS — E559 Vitamin D deficiency, unspecified: Secondary | ICD-10-CM

## 2023-09-01 DIAGNOSIS — E1121 Type 2 diabetes mellitus with diabetic nephropathy: Secondary | ICD-10-CM

## 2023-09-01 MED ORDER — VITAMIN D (ERGOCALCIFEROL) 1.25 MG (50000 UNIT) PO CAPS
50000.0000 [IU] | ORAL_CAPSULE | ORAL | 1 refills | Status: DC
  Filled 2023-09-01: qty 12, 84d supply, fill #0
  Filled 2023-12-03: qty 12, 84d supply, fill #1

## 2023-09-01 MED ORDER — OZEMPIC (2 MG/DOSE) 8 MG/3ML ~~LOC~~ SOPN
2.0000 mg | PEN_INJECTOR | SUBCUTANEOUS | 0 refills | Status: DC
  Filled 2023-09-01: qty 9, 84d supply, fill #0

## 2023-09-09 ENCOUNTER — Other Ambulatory Visit (HOSPITAL_BASED_OUTPATIENT_CLINIC_OR_DEPARTMENT_OTHER): Payer: Self-pay

## 2023-09-09 ENCOUNTER — Telehealth (INDEPENDENT_AMBULATORY_CARE_PROVIDER_SITE_OTHER): Payer: Commercial Managed Care - PPO | Admitting: Psychiatry

## 2023-09-09 ENCOUNTER — Other Ambulatory Visit: Payer: Self-pay

## 2023-09-09 ENCOUNTER — Encounter (HOSPITAL_COMMUNITY): Payer: Self-pay | Admitting: Psychiatry

## 2023-09-09 DIAGNOSIS — F410 Panic disorder [episodic paroxysmal anxiety] without agoraphobia: Secondary | ICD-10-CM

## 2023-09-09 DIAGNOSIS — F411 Generalized anxiety disorder: Secondary | ICD-10-CM | POA: Diagnosis not present

## 2023-09-09 DIAGNOSIS — F331 Major depressive disorder, recurrent, moderate: Secondary | ICD-10-CM | POA: Diagnosis not present

## 2023-09-09 MED ORDER — CITALOPRAM HYDROBROMIDE 40 MG PO TABS
40.0000 mg | ORAL_TABLET | Freq: Every day | ORAL | 2 refills | Status: DC
  Filled 2023-09-09: qty 30, 30d supply, fill #0
  Filled 2023-10-12: qty 30, 30d supply, fill #1
  Filled 2023-11-12: qty 30, 30d supply, fill #2

## 2023-09-09 MED ORDER — LAMOTRIGINE 200 MG PO TABS
200.0000 mg | ORAL_TABLET | Freq: Every day | ORAL | 0 refills | Status: DC
  Filled 2023-09-09 – 2023-09-22 (×2): qty 30, 30d supply, fill #0

## 2023-09-09 MED ORDER — LAMOTRIGINE 25 MG PO TABS
50.0000 mg | ORAL_TABLET | Freq: Every day | ORAL | 0 refills | Status: DC
  Filled 2023-09-09 – 2023-09-22 (×2): qty 60, 30d supply, fill #0

## 2023-09-09 MED ORDER — BUPROPION HCL ER (SR) 100 MG PO TB12
100.0000 mg | ORAL_TABLET | Freq: Every day | ORAL | 0 refills | Status: DC
  Filled 2023-09-09: qty 30, 30d supply, fill #0

## 2023-09-09 MED ORDER — VILAZODONE HCL 10 MG PO TABS
20.0000 mg | ORAL_TABLET | Freq: Every day | ORAL | 1 refills | Status: DC
  Filled 2023-09-09: qty 30, 15d supply, fill #0
  Filled 2023-09-22: qty 30, 15d supply, fill #1

## 2023-09-09 NOTE — Progress Notes (Signed)
Patient ID: Shannon [name], female   DOB: 1966/02/04, 57 y.o.   MRN: [id number]  [facility] Outpatient Follow up visit  Shannon [name] [id number] 57 y.o.  09/09/2023 2:54 PM  Virtual Visit via Video Note  I connected with Shannon [name] on 09/09/23 at  2:30 PM EST by a video enabled telemedicine application and verified that I am speaking with the correct person using two identifiers.  Location: Patient: home Provider: home office   I discussed the limitations of evaluation and management by telemedicine and the availability of in person appointments. The patient expressed understanding and agreed to proceed.      I discussed the assessment and treatment plan with the patient. The patient was provided an opportunity to ask questions and all were answered. The patient agreed with the plan and demonstrated an understanding of the instructions.   The patient was advised to call back or seek an in-person evaluation if the symptoms worsen or if the condition fails to improve as anticipated.  I provided 20 minutes of non-face-to-face time during this encounter.     Chief Complaint:  Depression follow up       HPI: Works as Buyer, retail over the weekend  Was doing fair last visit despite prior breakup. Doing somewhat subdued, feels its the witner and time change. Also some stress at job structure  Son is doing good Wants to adjust some meds for winter depression   Mom is good support  Modifying factor: son, mom Aggravating factor : job can be   Duration  7 -8 years Severity somewhat subdued   Past Psychiatric History/Hospitalization(s) Manged for depression and mood symptoms for more then 20 years.  Prior Suicide Attempts: No  Medical History; Past Medical History:  Diagnosis Date   ADHD (attention deficit hyperactivity disorder)    Anxiety    Arthritis    back and hips    Depression    Diabetes mellitus type II, controlled (HCC) 08/15/2014   GERD  (gastroesophageal reflux disease)    HPV (human papilloma virus) infection    Hyperlipidemia    Lateral meniscus tear    left knee   Menopausal state    PONV (postoperative nausea and vomiting)    slow to wake up    Pre-diabetes    Shortness of breath dyspnea    with exertion    Sleep apnea    cpap -0 setting at 16, uses CPAP nightly   Stress incontinence     Allergies: Allergies  Allergen Reactions   Rosuvastatin     MUSCLE ACHES   Food Swelling    Big Red Gum makes her tongue swell   Latuda [Lurasidone Hcl] Other (See Comments)    Severe aggitation   Lipitor [Atorvastatin] Other (See Comments)    Muscle aches   Atorvastatin Calcium    Belviq [Lorcaserin Hcl]     Increased appetitie   Contrave [Naltrexone-Bupropion Hcl Er] Other (See Comments)    Sleepy.    Medications: Outpatient Encounter Medications as of 09/09/2023  Medication Sig   buPROPion ER (WELLBUTRIN SR) 100 MG 12 hr tablet Take 1 tablet (100 mg total) by mouth daily.   acetaminophen (TYLENOL) 500 MG tablet Take 1 tablet (500 mg total) by mouth every 6 (six) hours as needed.   ALPRAZolam (XANAX) 0.5 MG tablet Take 1 tablet (0.5 mg total) by mouth daily as needed.   Blood Glucose Monitoring Suppl (FREESTYLE LITE) w/Device KIT USE AS DIRECTED TO TEST BLOOD  SUGAR 4 TIMES A DAY AS DIRECTED   citalopram (CELEXA) 40 MG tablet Take 1 tablet (40 mg total) by mouth daily.   erythromycin ophthalmic ointment Place 1 Application into both eyes 3 (three) times daily.   ferrous sulfate 325 (65 FE) MG tablet Take 325 mg by mouth daily with breakfast.   glucose blood (FREESTYLE LITE) test strip Use as directed to test blood sugar 4 times daily   ibuprofen (ADVIL) 800 MG tablet Take 1 tablet (800 mg total) by mouth 3 (three) times daily with meals as needed for headache, moderate pain or cramping.   lamoTRIgine (LAMICTAL) 200 MG tablet Take 1 tablet (200 mg total) by mouth daily.   lamoTRIgine (LAMICTAL) 25 MG tablet Take 2  tablets (50 mg total) by mouth daily. Adding 50mg  a day in addition to 200mg    Lancets (FREESTYLE) lancets Use as directed to test blood sugar 4 times daily   mirabegron ER (MYRBETRIQ) 50 MG TB24 tablet Take 1 tablet (50 mg total) by mouth daily.   Na Sulfate-K Sulfate-Mg Sulf 17.5-3.13-1.6 GM/177ML SOLN Take 177 mLs by mouth 2 (two) times. Please refer to prep instructions sent by [facility]   pregabalin (LYRICA) 100 MG capsule Take 1 capsule (100 mg total) by mouth 2 (two) times daily.   Semaglutide, 2 MG/DOSE, (OZEMPIC, 2 MG/DOSE,) 8 MG/3ML SOPN Inject 2 mg into the skin once a week.   Vilazodone HCl (VIIBRYD) 10 MG TABS Take 2 tablets (20 mg total) by mouth daily.   Vitamin D, Ergocalciferol, (DRISDOL) 1.25 MG (50000 UNIT) CAPS capsule Take 1 capsule (50,000 Units total) by mouth every 7 (seven) days.   [DISCONTINUED] citalopram (CELEXA) 40 MG tablet Take 1 tablet (40 mg total) by mouth daily.   [DISCONTINUED] lamoTRIgine (LAMICTAL) 200 MG tablet Take 1 tablet (200 mg total) by mouth daily.   [DISCONTINUED] lamoTRIgine (LAMICTAL) 25 MG tablet Take 2 tablets (50 mg total) by mouth daily. Adding 50mg  a day in addition to 200mg    [DISCONTINUED] Vilazodone HCl (VIIBRYD) 10 MG TABS Take 1 tablet (10 mg total) by mouth daily.   No facility-administered encounter medications on file as of 09/09/2023.     Family History; Family History  Problem Relation Age of Onset   Hyperlipidemia Mother    Sleep apnea Mother    Depression Mother    Kidney disease Mother    Diabetes Father    Hypertension Father    COPD Father    Depression Sister    Alcohol abuse Paternal Grandfather    Diabetes Paternal Aunt    Diabetes Paternal Uncle    Alcohol abuse Paternal Uncle    Leukemia Paternal Uncle       Labs:  Recent Results (from the past 2160 hour(s))  POCT UA - Microalbumin     Status: Abnormal   Collection Time: 06/17/23  3:15 PM  Result Value Ref Range   Microalbumin Ur, POC 30 mg/L   Creatinine,  POC 50 mg/dL   Albumin/Creatinine Ratio, Urine, POC 30-300   POCT HgB A1C     Status: Normal   Collection Time: 06/17/23  3:16 PM  Result Value Ref Range   Hemoglobin A1C 5.2 4.0 - 5.6 %   HbA1c POC (<> result, manual entry)     HbA1c, POC (prediabetic range)     HbA1c, POC (controlled diabetic range) 5.2 0.0 - 7.0 %          Mental Status Examination;   Psychiatric Specialty Exam: Physical Exam  Review  of Systems  Cardiovascular:  Negative for chest pain.  Neurological:  Negative for tremors.  Psychiatric/Behavioral:  Positive for depression. Negative for hallucinations.     Last menstrual period 12/17/2016.There is no height or weight on file to calculate BMI.  General Appearance:casual  Eye Contact::  fair  Speech:  Normal Rate  Volume:  Normal  Mood :somewhat subdued  Affect:  congruent  Thought Process:  Coherent  Orientation:  Full (Time, Place, and Person)  Thought Content:  Rumination  Suicidal Thoughts:  No  Homicidal Thoughts:  No  Memory:  Immediate;   Fair Recent;   Fair  Judgement:  Fair  Insight:  Shallow  Psychomotor Activity:  Normal  Concentration:  Fair  Recall:  Fair  Akathisia:  Negative  Handed:  Right  AIMS (if indicated):     Assets:  Communication Skills Desire for Improvement Financial Resources/Insurance Housing  Sleep:        Assessment: Axis I: mood disorder unspecified. Rule out mood disorder secondary to general medical condition. Major depressive disorder recurrent moderate. Rule out panic disorder. Generalized anxiety disorder   Axis III:  Past Medical History:  Diagnosis Date   ADHD (attention deficit hyperactivity disorder)    Anxiety    Arthritis    back and hips    Depression    Diabetes mellitus type II, controlled (HCC) 08/15/2014   GERD (gastroesophageal reflux disease)    HPV (human papilloma virus) infection    Hyperlipidemia    Lateral meniscus tear    left knee   Menopausal state    PONV  (postoperative nausea and vomiting)    slow to wake up    Pre-diabetes    Shortness of breath dyspnea    with exertion    Sleep apnea    cpap -0 setting at 16, uses CPAP nightly   Stress incontinence     Axis IV: psychosocial   Treatment Plan and Summary:  Prior documentation reviewed   Depression: subdued feels due to winter . Will increase viibryd to 20mg  continue celexa, lamictal  Lyrica from pcp Anxiety : manageable continue celexa  Panic attacks : sporadic , continue celexa and work on breathing techniques  Provided supportive therapy, call earlier If needed otherwise 52m.    Fu 53m    , Shannon Huyett, MD 09/09/2023

## 2023-09-10 ENCOUNTER — Other Ambulatory Visit (HOSPITAL_BASED_OUTPATIENT_CLINIC_OR_DEPARTMENT_OTHER): Payer: Self-pay

## 2023-09-19 DIAGNOSIS — G4733 Obstructive sleep apnea (adult) (pediatric): Secondary | ICD-10-CM | POA: Diagnosis not present

## 2023-09-22 ENCOUNTER — Other Ambulatory Visit: Payer: Self-pay | Admitting: Physician Assistant

## 2023-09-22 ENCOUNTER — Ambulatory Visit: Payer: Commercial Managed Care - PPO | Admitting: Physician Assistant

## 2023-09-22 ENCOUNTER — Other Ambulatory Visit (HOSPITAL_COMMUNITY): Payer: Self-pay | Admitting: Psychiatry

## 2023-09-22 DIAGNOSIS — F410 Panic disorder [episodic paroxysmal anxiety] without agoraphobia: Secondary | ICD-10-CM

## 2023-09-22 DIAGNOSIS — M7918 Myalgia, other site: Secondary | ICD-10-CM

## 2023-09-22 DIAGNOSIS — F411 Generalized anxiety disorder: Secondary | ICD-10-CM

## 2023-09-23 ENCOUNTER — Other Ambulatory Visit (HOSPITAL_BASED_OUTPATIENT_CLINIC_OR_DEPARTMENT_OTHER): Payer: Self-pay

## 2023-09-23 MED ORDER — ALPRAZOLAM 0.5 MG PO TABS
0.5000 mg | ORAL_TABLET | Freq: Every day | ORAL | 0 refills | Status: DC | PRN
  Filled 2023-09-23: qty 30, 30d supply, fill #0

## 2023-09-24 ENCOUNTER — Other Ambulatory Visit (HOSPITAL_COMMUNITY): Payer: Self-pay

## 2023-09-24 ENCOUNTER — Other Ambulatory Visit (HOSPITAL_BASED_OUTPATIENT_CLINIC_OR_DEPARTMENT_OTHER): Payer: Self-pay

## 2023-09-24 ENCOUNTER — Other Ambulatory Visit: Payer: Self-pay

## 2023-09-29 ENCOUNTER — Other Ambulatory Visit (HOSPITAL_BASED_OUTPATIENT_CLINIC_OR_DEPARTMENT_OTHER): Payer: Self-pay

## 2023-09-29 ENCOUNTER — Other Ambulatory Visit: Payer: Self-pay

## 2023-09-29 MED ORDER — PREGABALIN 100 MG PO CAPS
100.0000 mg | ORAL_CAPSULE | Freq: Two times a day (BID) | ORAL | 1 refills | Status: DC
  Filled 2023-09-29: qty 180, 90d supply, fill #0
  Filled 2024-01-22: qty 180, 90d supply, fill #1

## 2023-10-12 ENCOUNTER — Other Ambulatory Visit (HOSPITAL_COMMUNITY): Payer: Self-pay | Admitting: Psychiatry

## 2023-10-13 ENCOUNTER — Other Ambulatory Visit: Payer: Self-pay

## 2023-10-13 ENCOUNTER — Other Ambulatory Visit (HOSPITAL_BASED_OUTPATIENT_CLINIC_OR_DEPARTMENT_OTHER): Payer: Self-pay

## 2023-10-13 MED ORDER — VILAZODONE HCL 10 MG PO TABS
20.0000 mg | ORAL_TABLET | Freq: Every day | ORAL | 1 refills | Status: DC
  Filled 2023-10-13: qty 30, 15d supply, fill #0
  Filled 2023-10-28: qty 30, 15d supply, fill #1

## 2023-10-19 ENCOUNTER — Other Ambulatory Visit (HOSPITAL_COMMUNITY): Payer: Self-pay | Admitting: Psychiatry

## 2023-10-19 DIAGNOSIS — G4733 Obstructive sleep apnea (adult) (pediatric): Secondary | ICD-10-CM | POA: Diagnosis not present

## 2023-10-25 MED ORDER — LAMOTRIGINE 200 MG PO TABS
200.0000 mg | ORAL_TABLET | Freq: Every day | ORAL | 0 refills | Status: DC
  Filled 2023-10-25: qty 30, 30d supply, fill #0

## 2023-10-25 MED ORDER — LAMOTRIGINE 25 MG PO TABS
50.0000 mg | ORAL_TABLET | Freq: Every day | ORAL | 0 refills | Status: DC
  Filled 2023-10-25: qty 60, 30d supply, fill #0

## 2023-10-26 ENCOUNTER — Other Ambulatory Visit (HOSPITAL_BASED_OUTPATIENT_CLINIC_OR_DEPARTMENT_OTHER): Payer: Self-pay

## 2023-10-26 ENCOUNTER — Other Ambulatory Visit: Payer: Self-pay

## 2023-11-02 ENCOUNTER — Other Ambulatory Visit (HOSPITAL_BASED_OUTPATIENT_CLINIC_OR_DEPARTMENT_OTHER): Payer: Self-pay

## 2023-11-02 ENCOUNTER — Other Ambulatory Visit (HOSPITAL_COMMUNITY): Payer: Self-pay | Admitting: Psychiatry

## 2023-11-02 DIAGNOSIS — F410 Panic disorder [episodic paroxysmal anxiety] without agoraphobia: Secondary | ICD-10-CM

## 2023-11-02 DIAGNOSIS — F411 Generalized anxiety disorder: Secondary | ICD-10-CM

## 2023-11-02 MED ORDER — ALPRAZOLAM 0.5 MG PO TABS
0.5000 mg | ORAL_TABLET | Freq: Every day | ORAL | 0 refills | Status: DC | PRN
  Filled 2023-11-02: qty 30, 30d supply, fill #0

## 2023-11-06 ENCOUNTER — Telehealth: Payer: Self-pay

## 2023-11-06 NOTE — Telephone Encounter (Signed)
 VOB submitted for Orthovisc, right knee

## 2023-11-09 ENCOUNTER — Other Ambulatory Visit: Payer: Self-pay

## 2023-11-09 ENCOUNTER — Other Ambulatory Visit (HOSPITAL_COMMUNITY): Payer: Self-pay

## 2023-11-09 ENCOUNTER — Encounter: Payer: Self-pay | Admitting: Physician Assistant

## 2023-11-09 ENCOUNTER — Ambulatory Visit: Payer: Commercial Managed Care - PPO | Admitting: Physician Assistant

## 2023-11-09 ENCOUNTER — Other Ambulatory Visit (HOSPITAL_COMMUNITY): Payer: Self-pay | Admitting: Psychiatry

## 2023-11-09 DIAGNOSIS — M1711 Unilateral primary osteoarthritis, right knee: Secondary | ICD-10-CM | POA: Diagnosis not present

## 2023-11-09 MED ORDER — VILAZODONE HCL 10 MG PO TABS
20.0000 mg | ORAL_TABLET | Freq: Every day | ORAL | 0 refills | Status: DC
  Filled 2023-11-09: qty 30, 15d supply, fill #0

## 2023-11-09 NOTE — Progress Notes (Signed)
HPI: Mr. Dickenson comes in today due to right knee pain.  The last was seen in July 2023 when she was given a right knee Euflexxa injection.  States the injection lasted until about 2 months ago.  She has had no new injury to the knee.  She has known osteoarthritis of her right knee.  She does take Tylenol arthritis for the pain.  She has tried cortisone injections with minimal results in the past.  She has no scheduled right knee surgery in the next 6 months.  She notes the knee continues to grind and gives way at times.  She does wear knee brace at times.  She has lost some 50 pounds since we last saw her this was intentional.  She is wanting to undergo a viscosupplementation injection in her knee again if she had good results with this.  Review of systems: See HPI otherwise negative  Physical exam: General: Well-developed well-nourished female no acute distress mood affect appropriate.  Ambulates without any assistive device Right knee: Good range of motion.  Crepitus with passive range of motion.  Tenderness along the lateral joint line.  Also tenderness over the peripatellar region particularly on the left side.  No abnormal warmth erythema or effusion.  Radiographs: Right knee: Right knee is well located.  Moderate patellofemoral changes.  Lateral compartmental narrowing is moderate with periarticular spurring.  Medial joint line overall well-preserved.  Impression: Right knee osteoarthritis  Plan: Will try to gain approval for viscosupplementation injection for the right knee and have her back once this is available.  Questions were encouraged and answered at length.

## 2023-11-11 ENCOUNTER — Encounter (HOSPITAL_COMMUNITY): Payer: Self-pay | Admitting: Psychiatry

## 2023-11-11 ENCOUNTER — Telehealth (HOSPITAL_COMMUNITY): Payer: Commercial Managed Care - PPO | Admitting: Psychiatry

## 2023-11-11 DIAGNOSIS — F410 Panic disorder [episodic paroxysmal anxiety] without agoraphobia: Secondary | ICD-10-CM

## 2023-11-11 DIAGNOSIS — F411 Generalized anxiety disorder: Secondary | ICD-10-CM

## 2023-11-11 DIAGNOSIS — F331 Major depressive disorder, recurrent, moderate: Secondary | ICD-10-CM | POA: Diagnosis not present

## 2023-11-11 MED ORDER — VILAZODONE HCL 10 MG PO TABS: 10.0000 mg | ORAL_TABLET | Freq: Every day | ORAL | Status: DC

## 2023-11-11 NOTE — Progress Notes (Signed)
Patient ID: KAMARYN ANTES, female   DOB: 02/13/1966, 58 y.o.   MRN: [id number]  [facility] Outpatient Follow up visit  SARYAH AMES [id number] 58 y.o.  11/11/2023 1:58 PM  Virtual Visit via Video Note  I connected with Nithila D Langlois on 11/11/23 at  1:30 PM EST by a video enabled telemedicine application and verified that I am speaking with the correct person using two identifiers.  Location: Patient: home Provider: home office   I discussed the limitations of evaluation and management by telemedicine and the availability of in person appointments. The patient expressed understanding and agreed to proceed.     I discussed the assessment and treatment plan with the patient. The patient was provided an opportunity to ask questions and all were answered. The patient agreed with the plan and demonstrated an understanding of the instructions.   The patient was advised to call back or seek an in-person evaluation if the symptoms worsen or if the condition fails to improve as anticipated.  I provided 20 minutes of non-face-to-face time during this encounter.        Chief Complaint:  Depression follow up       HPI: Works as Buyer, retail over the weekend Last visit was subdued, felt winter depression and job stress  We increased viibryd to 20mg   She still has concern related to job, has adjusted hours but its  a challenge, she has self reduced viibryd back to 10mg  and says doing fair except for above stress Does go out and added activities      Mom is good support  Modifying factor: son, mom Aggravating factor : jjob   Duration 8 years plus Severity somewhat streessed   Past Psychiatric History/Hospitalization(s) Manged for depression and mood symptoms for more then 20 years.  Prior Suicide Attempts: No  Medical History; Past Medical History:  Diagnosis Date   ADHD (attention deficit hyperactivity disorder)    Anxiety    Arthritis    back and hips     Depression    Diabetes mellitus type II, controlled (HCC) 08/15/2014   GERD (gastroesophageal reflux disease)    HPV (human papilloma virus) infection    Hyperlipidemia    Lateral meniscus tear    left knee   Menopausal state    PONV (postoperative nausea and vomiting)    slow to wake up    Pre-diabetes    Shortness of breath dyspnea    with exertion    Sleep apnea    cpap -0 setting at 16, uses CPAP nightly   Stress incontinence     Allergies: Allergies  Allergen Reactions   Rosuvastatin     MUSCLE ACHES   Food Swelling    Big Red Gum makes her tongue swell   Latuda [Lurasidone Hcl] Other (See Comments)    Severe aggitation   Lipitor [Atorvastatin] Other (See Comments)    Muscle aches   Atorvastatin Calcium    Belviq [Lorcaserin Hcl]     Increased appetitie   Contrave [Naltrexone-Bupropion Hcl Er] Other (See Comments)    Sleepy.    Medications: Outpatient Encounter Medications as of 11/11/2023  Medication Sig   acetaminophen (TYLENOL) 500 MG tablet Take 1 tablet (500 mg total) by mouth every 6 (six) hours as needed.   ALPRAZolam (XANAX) 0.5 MG tablet Take 1 tablet (0.5 mg total) by mouth daily as needed.   Blood Glucose Monitoring Suppl (FREESTYLE LITE) w/Device KIT USE AS DIRECTED TO TEST BLOOD SUGAR 4  TIMES A DAY AS DIRECTED   citalopram (CELEXA) 40 MG tablet Take 1 tablet (40 mg total) by mouth daily.   erythromycin ophthalmic ointment Place 1 Application into both eyes 3 (three) times daily.   ferrous sulfate 325 (65 FE) MG tablet Take 325 mg by mouth daily with breakfast.   glucose blood (FREESTYLE LITE) test strip Use as directed to test blood sugar 4 times daily   ibuprofen (ADVIL) 800 MG tablet Take 1 tablet (800 mg total) by mouth 3 (three) times daily with meals as needed for headache, moderate pain or cramping.   lamoTRIgine (LAMICTAL) 200 MG tablet Take 1 tablet (200 mg total) by mouth daily.   lamoTRIgine (LAMICTAL) 25 MG tablet Take 2 tablets (50 mg  total) by mouth daily. Adding 50mg  a day in addition to 200mg    Lancets (FREESTYLE) lancets Use as directed to test blood sugar 4 times daily   mirabegron ER (MYRBETRIQ) 50 MG TB24 tablet Take 1 tablet (50 mg total) by mouth daily.   Na Sulfate-K Sulfate-Mg Sulf 17.5-3.13-1.6 GM/177ML SOLN Take 177 mLs by mouth 2 (two) times. Please refer to prep instructions sent by [facility]   pregabalin (LYRICA) 100 MG capsule Take 1 capsule (100 mg total) by mouth 2 (two) times daily.   Semaglutide, 2 MG/DOSE, (OZEMPIC, 2 MG/DOSE,) 8 MG/3ML SOPN Inject 2 mg into the skin once a week.   Vilazodone HCl (VIIBRYD) 10 MG TABS Take 1 tablet (10 mg total) by mouth daily.   Vitamin D, Ergocalciferol, (DRISDOL) 1.25 MG (50000 UNIT) CAPS capsule Take 1 capsule (50,000 Units total) by mouth every 7 (seven) days.   [DISCONTINUED] buPROPion ER (WELLBUTRIN SR) 100 MG 12 hr tablet Take 1 tablet (100 mg total) by mouth daily.   [DISCONTINUED] Vilazodone HCl (VIIBRYD) 10 MG TABS Take 2 tablets (20 mg total) by mouth daily.   No facility-administered encounter medications on file as of 11/11/2023.     Family History; Family History  Problem Relation Age of Onset   Hyperlipidemia Mother    Sleep apnea Mother    Depression Mother    Kidney disease Mother    Diabetes Father    Hypertension Father    COPD Father    Depression Sister    Alcohol abuse Paternal Grandfather    Diabetes Paternal Aunt    Diabetes Paternal Uncle    Alcohol abuse Paternal Uncle    Leukemia Paternal Uncle       Labs:  No results found for this or any previous visit (from the past 2160 hours).         Mental Status Examination;   Psychiatric Specialty Exam: Physical Exam  Review of Systems  Cardiovascular:  Negative for chest pain.  Neurological:  Negative for tremors.  Psychiatric/Behavioral:  Negative for hallucinations.     Last menstrual period 12/17/2016.There is no height or weight on file to calculate BMI.  General  Appearance:casual  Eye Contact::  fair  Speech:  Normal Rate  Volume:  Normal  Mood :fair  Affect:  congruent  Thought Process:  Coherent  Orientation:  Full (Time, Place, and Person)  Thought Content:  Rumination  Suicidal Thoughts:  No  Homicidal Thoughts:  No  Memory:  Immediate;   Fair Recent;   Fair  Judgement:  Fair  Insight:  Shallow  Psychomotor Activity:  Normal  Concentration:  Fair  Recall:  Fair  Akathisia:  Negative  Handed:  Right  AIMS (if indicated):     Assets:  Communication Skills Desire for Improvement Financial Resources/Insurance Housing  Sleep:        Assessment: Axis I: mood disorder unspecified. Rule out mood disorder secondary to general medical condition. Major depressive disorder recurrent moderate. Rule out panic disorder. Generalized anxiety disorder   Axis III:  Past Medical History:  Diagnosis Date   ADHD (attention deficit hyperactivity disorder)    Anxiety    Arthritis    back and hips    Depression    Diabetes mellitus type II, controlled (HCC) 08/15/2014   GERD (gastroesophageal reflux disease)    HPV (human papilloma virus) infection    Hyperlipidemia    Lateral meniscus tear    left knee   Menopausal state    PONV (postoperative nausea and vomiting)    slow to wake up    Pre-diabetes    Shortness of breath dyspnea    with exertion    Sleep apnea    cpap -0 setting at 16, uses CPAP nightly   Stress incontinence     Axis IV: psychosocial   Treatment Plan and Summary:  Prior documentation reviewed    Depression: subdued due to job concerns she otherwise feel meds are at right doses. Will continue lamictal 225mg , celexa and viibryd  She plans to look around or further adjust job hours that may hellp  Anxiety : manageable continue celexa and add ME time On xanax prn   Panic attacks : sporadic not worse, continue celexa and breathing techniques    Fu 4 - 40m renewed meds if due     , Thresa Ross,  MD 11/11/2023

## 2023-11-24 ENCOUNTER — Other Ambulatory Visit (HOSPITAL_BASED_OUTPATIENT_CLINIC_OR_DEPARTMENT_OTHER): Payer: Self-pay

## 2023-11-24 ENCOUNTER — Other Ambulatory Visit (HOSPITAL_COMMUNITY): Payer: Self-pay | Admitting: Psychiatry

## 2023-11-24 MED ORDER — LAMOTRIGINE 200 MG PO TABS
200.0000 mg | ORAL_TABLET | Freq: Every day | ORAL | 0 refills | Status: DC
  Filled 2023-11-24: qty 30, 30d supply, fill #0

## 2023-11-24 MED ORDER — LAMOTRIGINE 25 MG PO TABS
50.0000 mg | ORAL_TABLET | Freq: Every day | ORAL | 0 refills | Status: DC
  Filled 2023-11-24: qty 60, 30d supply, fill #0

## 2023-11-25 ENCOUNTER — Other Ambulatory Visit (HOSPITAL_BASED_OUTPATIENT_CLINIC_OR_DEPARTMENT_OTHER): Payer: Self-pay

## 2023-12-03 ENCOUNTER — Other Ambulatory Visit: Payer: Self-pay | Admitting: Physician Assistant

## 2023-12-03 ENCOUNTER — Other Ambulatory Visit: Payer: Self-pay

## 2023-12-03 DIAGNOSIS — E1121 Type 2 diabetes mellitus with diabetic nephropathy: Secondary | ICD-10-CM

## 2023-12-04 ENCOUNTER — Other Ambulatory Visit (HOSPITAL_BASED_OUTPATIENT_CLINIC_OR_DEPARTMENT_OTHER): Payer: Self-pay

## 2023-12-04 MED ORDER — OZEMPIC (2 MG/DOSE) 8 MG/3ML ~~LOC~~ SOPN
2.0000 mg | PEN_INJECTOR | SUBCUTANEOUS | 0 refills | Status: DC
  Filled 2023-12-04 – 2023-12-07 (×2): qty 9, 84d supply, fill #0

## 2023-12-07 ENCOUNTER — Other Ambulatory Visit: Payer: Self-pay

## 2023-12-07 ENCOUNTER — Other Ambulatory Visit (HOSPITAL_COMMUNITY): Payer: Self-pay

## 2023-12-15 ENCOUNTER — Other Ambulatory Visit (HOSPITAL_COMMUNITY): Payer: Self-pay | Admitting: Psychiatry

## 2023-12-15 DIAGNOSIS — F410 Panic disorder [episodic paroxysmal anxiety] without agoraphobia: Secondary | ICD-10-CM

## 2023-12-15 DIAGNOSIS — F411 Generalized anxiety disorder: Secondary | ICD-10-CM

## 2023-12-15 DIAGNOSIS — F331 Major depressive disorder, recurrent, moderate: Secondary | ICD-10-CM

## 2023-12-16 ENCOUNTER — Other Ambulatory Visit (HOSPITAL_BASED_OUTPATIENT_CLINIC_OR_DEPARTMENT_OTHER): Payer: Self-pay

## 2023-12-16 MED ORDER — ALPRAZOLAM 0.5 MG PO TABS
0.5000 mg | ORAL_TABLET | Freq: Every day | ORAL | 0 refills | Status: DC | PRN
  Filled 2023-12-16: qty 30, 30d supply, fill #0

## 2023-12-16 MED ORDER — CITALOPRAM HYDROBROMIDE 40 MG PO TABS
40.0000 mg | ORAL_TABLET | Freq: Every day | ORAL | 2 refills | Status: DC
Start: 2023-12-16 — End: 2024-03-28
  Filled 2023-12-16: qty 30, 30d supply, fill #0
  Filled 2024-01-19: qty 30, 30d supply, fill #1
  Filled 2024-02-16: qty 30, 30d supply, fill #2

## 2023-12-24 ENCOUNTER — Telehealth: Payer: Self-pay | Admitting: Physician Assistant

## 2023-12-24 NOTE — Telephone Encounter (Signed)
 Copied from CRM [id number]. Topic: General - Other >> Dec 24, 2023  3:55 PM Tiffany S wrote: Reason for CRM: Patient is having dental work done on Tuesday she stated they are giving her nitrous and was wondering if the provider can prescribe nausea. Patient requesting call back.

## 2023-12-28 ENCOUNTER — Encounter: Payer: Self-pay | Admitting: Physician Assistant

## 2023-12-28 ENCOUNTER — Other Ambulatory Visit (HOSPITAL_BASED_OUTPATIENT_CLINIC_OR_DEPARTMENT_OTHER): Payer: Self-pay

## 2023-12-28 MED ORDER — ONDANSETRON 8 MG PO TBDP
8.0000 mg | ORAL_TABLET | Freq: Three times a day (TID) | ORAL | 1 refills | Status: DC | PRN
  Filled 2023-12-28: qty 20, 7d supply, fill #0

## 2023-12-28 NOTE — Addendum Note (Signed)
 Addended by: Jomarie Longs on: 12/28/2023 08:50 AM   Modules accepted: Orders

## 2024-01-05 ENCOUNTER — Other Ambulatory Visit (HOSPITAL_COMMUNITY): Payer: Self-pay | Admitting: Psychiatry

## 2024-01-06 ENCOUNTER — Other Ambulatory Visit (HOSPITAL_COMMUNITY): Payer: Self-pay

## 2024-01-06 ENCOUNTER — Other Ambulatory Visit: Payer: Self-pay

## 2024-01-06 MED ORDER — LAMOTRIGINE 200 MG PO TABS
200.0000 mg | ORAL_TABLET | Freq: Every day | ORAL | 0 refills | Status: DC
  Filled 2024-01-06: qty 30, 30d supply, fill #0

## 2024-01-06 MED ORDER — LAMOTRIGINE 25 MG PO TABS
50.0000 mg | ORAL_TABLET | Freq: Every day | ORAL | 0 refills | Status: DC
  Filled 2024-01-06: qty 60, 30d supply, fill #0

## 2024-01-07 ENCOUNTER — Other Ambulatory Visit (HOSPITAL_COMMUNITY): Payer: Self-pay

## 2024-01-07 ENCOUNTER — Encounter (HOSPITAL_COMMUNITY): Payer: Self-pay

## 2024-01-07 ENCOUNTER — Other Ambulatory Visit: Payer: Self-pay

## 2024-01-19 ENCOUNTER — Other Ambulatory Visit (HOSPITAL_COMMUNITY): Payer: Self-pay | Admitting: Psychiatry

## 2024-01-19 ENCOUNTER — Other Ambulatory Visit (HOSPITAL_BASED_OUTPATIENT_CLINIC_OR_DEPARTMENT_OTHER): Payer: Self-pay

## 2024-01-19 DIAGNOSIS — F411 Generalized anxiety disorder: Secondary | ICD-10-CM

## 2024-01-19 DIAGNOSIS — F410 Panic disorder [episodic paroxysmal anxiety] without agoraphobia: Secondary | ICD-10-CM

## 2024-01-19 MED ORDER — ALPRAZOLAM 0.5 MG PO TABS
0.5000 mg | ORAL_TABLET | Freq: Every day | ORAL | 0 refills | Status: DC | PRN
  Filled 2024-01-19: qty 30, 30d supply, fill #0

## 2024-01-22 ENCOUNTER — Other Ambulatory Visit: Payer: Self-pay

## 2024-01-27 ENCOUNTER — Other Ambulatory Visit (HOSPITAL_COMMUNITY): Payer: Self-pay | Admitting: *Deleted

## 2024-01-27 ENCOUNTER — Telehealth (HOSPITAL_COMMUNITY): Payer: Self-pay | Admitting: *Deleted

## 2024-01-27 MED ORDER — VILAZODONE HCL 10 MG PO TABS: 10.0000 mg | ORAL_TABLET | Freq: Every day | ORAL | Status: DC

## 2024-01-27 NOTE — Telephone Encounter (Signed)
 REFILL REQUEST PROVIDER AUTHORIZED REFILL Vilazodone HCl (VIIBRYD) 10 MG TABS   SENT CO SIGN

## 2024-01-29 ENCOUNTER — Ambulatory Visit (HOSPITAL_COMMUNITY): Admitting: Licensed Clinical Social Worker

## 2024-01-30 ENCOUNTER — Other Ambulatory Visit (HOSPITAL_COMMUNITY): Payer: Self-pay | Admitting: Psychiatry

## 2024-02-01 ENCOUNTER — Other Ambulatory Visit: Payer: Self-pay

## 2024-02-01 ENCOUNTER — Other Ambulatory Visit (HOSPITAL_COMMUNITY): Payer: Self-pay

## 2024-02-01 MED ORDER — LAMOTRIGINE 25 MG PO TABS
50.0000 mg | ORAL_TABLET | Freq: Every day | ORAL | 0 refills | Status: DC
  Filled 2024-02-01: qty 60, 30d supply, fill #0

## 2024-02-01 MED ORDER — LAMOTRIGINE 200 MG PO TABS
200.0000 mg | ORAL_TABLET | Freq: Every day | ORAL | 0 refills | Status: DC
  Filled 2024-02-01: qty 30, 30d supply, fill #0

## 2024-02-02 ENCOUNTER — Other Ambulatory Visit (HOSPITAL_COMMUNITY): Payer: Self-pay

## 2024-02-02 ENCOUNTER — Other Ambulatory Visit (HOSPITAL_COMMUNITY): Payer: Self-pay | Admitting: Psychiatry

## 2024-02-02 MED ORDER — VILAZODONE HCL 10 MG PO TABS
10.0000 mg | ORAL_TABLET | Freq: Every day | ORAL | 1 refills | Status: DC
Start: 2024-02-02 — End: 2024-03-28
  Filled 2024-02-02 – 2024-02-03 (×3): qty 30, 30d supply, fill #0
  Filled 2024-02-29: qty 30, 30d supply, fill #1

## 2024-02-03 ENCOUNTER — Other Ambulatory Visit: Payer: Self-pay

## 2024-02-04 ENCOUNTER — Other Ambulatory Visit (HOSPITAL_COMMUNITY): Payer: Self-pay

## 2024-02-10 ENCOUNTER — Telehealth: Payer: Self-pay | Admitting: Orthopaedic Surgery

## 2024-02-10 ENCOUNTER — Other Ambulatory Visit: Payer: Self-pay

## 2024-02-10 ENCOUNTER — Ambulatory Visit (INDEPENDENT_AMBULATORY_CARE_PROVIDER_SITE_OTHER): Admitting: Licensed Clinical Social Worker

## 2024-02-10 DIAGNOSIS — F331 Major depressive disorder, recurrent, moderate: Secondary | ICD-10-CM

## 2024-02-10 DIAGNOSIS — M1711 Unilateral primary osteoarthritis, right knee: Secondary | ICD-10-CM

## 2024-02-10 DIAGNOSIS — F411 Generalized anxiety disorder: Secondary | ICD-10-CM

## 2024-02-10 NOTE — Telephone Encounter (Signed)
 Patient called and wanted to let you know she got approved for the injection. [id number]#[phone]

## 2024-02-10 NOTE — Telephone Encounter (Signed)
 Called and left patient a VM to call back to schedule for gel injections with Dr. Lucienne Ryder or Dwyane Glad.
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
## 2024-02-22 ENCOUNTER — Other Ambulatory Visit: Payer: Self-pay | Admitting: Physician Assistant

## 2024-02-22 ENCOUNTER — Ambulatory Visit: Admitting: Physician Assistant

## 2024-02-22 ENCOUNTER — Encounter: Payer: Self-pay | Admitting: Physician Assistant

## 2024-02-22 DIAGNOSIS — M1711 Unilateral primary osteoarthritis, right knee: Secondary | ICD-10-CM

## 2024-02-22 DIAGNOSIS — E559 Vitamin D deficiency, unspecified: Secondary | ICD-10-CM

## 2024-02-22 MED ORDER — HYALURONAN 30 MG/2ML IX SOSY
30.0000 mg | PREFILLED_SYRINGE | INTRA_ARTICULAR | Status: AC | PRN
  Administered 2024-02-22: 30 mg via INTRA_ARTICULAR

## 2024-02-22 NOTE — Progress Notes (Signed)
   Procedure Note  Patient: Shannon [name]             Date of Birth: 1965/11/12           MRN: [id number]             Visit Date: 02/22/2024 HPI: Carrington returns today for her first Orthovisc injection right knee.  She has tried conservative measures that have given her minimal relief this includes cortisone injections, weight loss and knee exercises along with NSAIDs.  She has having more pain in the medial aspect of the knee with no new injury.  She has no scheduled surgery in the next 6 months for the right knee.  Physical exam: General Well-developed well-nourished female ambulates without any assistive device. Right knee: Good range of motion.  Tenderness along medial joint line.  No abnormal warmth erythema or effusion. Procedures: Visit Diagnoses:  1. Primary osteoarthritis of right knee     Large Joint Inj: R knee on 02/22/2024 9:03 AM Indications: pain Details: 22 G 1.5 in needle, anterolateral approach  Arthrogram: No  Medications: 30 mg Hyaluronan 30 MG/2ML Outcome: tolerated well, no immediate complications Procedure, treatment alternatives, risks and benefits explained, specific risks discussed. Consent was given by the patient. Immediately prior to procedure a time out was called to verify the correct patient, procedure, equipment, support staff and site/side marked as required. Patient was prepped and draped in the usual sterile fashion.      Plan: Will see her back in 1 week for second of 3 Orthovisc injections.  In the interim she will ice NSAIDs and Tylenol  for pain control.

## 2024-02-23 ENCOUNTER — Other Ambulatory Visit (HOSPITAL_COMMUNITY): Payer: Self-pay

## 2024-02-23 MED ORDER — VITAMIN D (ERGOCALCIFEROL) 1.25 MG (50000 UNIT) PO CAPS
50000.0000 [IU] | ORAL_CAPSULE | ORAL | 1 refills | Status: DC
Start: 2024-02-23 — End: 2024-08-22
  Filled 2024-02-23: qty 12, 84d supply, fill #0
  Filled 2024-05-16: qty 12, 84d supply, fill #1

## 2024-02-25 ENCOUNTER — Encounter (HOSPITAL_COMMUNITY): Payer: Self-pay

## 2024-02-26 ENCOUNTER — Other Ambulatory Visit: Payer: Self-pay

## 2024-02-26 ENCOUNTER — Encounter: Payer: Self-pay | Admitting: Physician Assistant

## 2024-02-26 ENCOUNTER — Other Ambulatory Visit (HOSPITAL_BASED_OUTPATIENT_CLINIC_OR_DEPARTMENT_OTHER): Payer: Self-pay

## 2024-02-26 ENCOUNTER — Ambulatory Visit: Admitting: Physician Assistant

## 2024-02-26 VITALS — BP 121/71 | HR 77 | Ht 65.0 in | Wt 179.0 lb

## 2024-02-26 DIAGNOSIS — F5101 Primary insomnia: Secondary | ICD-10-CM

## 2024-02-26 DIAGNOSIS — Z7985 Long-term (current) use of injectable non-insulin antidiabetic drugs: Secondary | ICD-10-CM

## 2024-02-26 DIAGNOSIS — E663 Overweight: Secondary | ICD-10-CM | POA: Diagnosis not present

## 2024-02-26 DIAGNOSIS — E1121 Type 2 diabetes mellitus with diabetic nephropathy: Secondary | ICD-10-CM | POA: Diagnosis not present

## 2024-02-26 DIAGNOSIS — E559 Vitamin D deficiency, unspecified: Secondary | ICD-10-CM | POA: Diagnosis not present

## 2024-02-26 DIAGNOSIS — R809 Proteinuria, unspecified: Secondary | ICD-10-CM

## 2024-02-26 DIAGNOSIS — M7918 Myalgia, other site: Secondary | ICD-10-CM | POA: Diagnosis not present

## 2024-02-26 DIAGNOSIS — R632 Polyphagia: Secondary | ICD-10-CM | POA: Diagnosis not present

## 2024-02-26 LAB — POCT UA - MICROALBUMIN
Creatinine, POC: 300 mg/dL
Microalbumin Ur, POC: 80 mg/L

## 2024-02-26 LAB — POCT GLYCOSYLATED HEMOGLOBIN (HGB A1C): Hemoglobin A1C: 5.1 % (ref 4.0–5.6)

## 2024-02-26 MED ORDER — TRAZODONE HCL 50 MG PO TABS
25.0000 mg | ORAL_TABLET | Freq: Every evening | ORAL | 2 refills | Status: DC | PRN
  Filled 2024-02-26: qty 30, 30d supply, fill #0
  Filled 2024-03-31: qty 30, 30d supply, fill #1
  Filled 2024-05-09 (×2): qty 30, 30d supply, fill #2

## 2024-02-26 MED ORDER — BELSOMRA 10 MG PO TABS
1.0000 | ORAL_TABLET | Freq: Every day | ORAL | 2 refills | Status: DC
  Filled 2024-02-26: qty 30, 30d supply, fill #0

## 2024-02-26 NOTE — Patient Instructions (Signed)
 Start belsomra follow up in 3 months.

## 2024-02-26 NOTE — Progress Notes (Unsigned)
 Established Patient Office Visit  Subjective   Patient ID: Shannon [name], female    DOB: 11/05/1965  Age: 58 y.o. MRN: [id number]  Chief Complaint  Patient presents with   Medical Management of Chronic Issues    Trouble staying asleep and getting to sleep, increased appetite on ozempic      HPI Pt is a 58 yo female with T2DM, myofascial pain, MDD, GAD who presents to the clinic for follow up.   She is taking ozempic  weekly. She is not checking her sugars. She denies any hypoglycemic events. She is not exercising. She feels like her appetite is increasing. She has gained some weight over last few months. She denies any CP, palpitations, headaches or vision changes.   Mood is controled.   She is not sleeping well. Trouble going and staying asleep. Melatonin not helping. She has failed ambien in past due to making her feel weird. She would like other options.     ROS See HPI   Objective:     BP 121/71   Pulse 77   Ht 5\' 5"  (1.651 m)   Wt 179 lb (81.2 kg)   LMP 12/17/2016 (Exact Date)   SpO2 99%   BMI 29.79 kg/m  BP Readings from Last 3 Encounters:  02/26/24 121/71  07/16/23 131/79  06/17/23 124/79   Wt Readings from Last 3 Encounters:  02/26/24 179 lb (81.2 kg)  07/16/23 172 lb 0.6 oz (78 kg)  06/17/23 173 lb 1.3 oz (78.5 kg)   .Aaron Aas Results for orders placed or performed in visit on 02/26/24  POCT HgB A1C   Collection Time: 02/26/24 10:36 AM  Result Value Ref Range   Hemoglobin A1C 5.1 4.0 - 5.6 %   HbA1c POC (<> result, manual entry)     HbA1c, POC (prediabetic range)     HbA1c, POC (controlled diabetic range)    POCT UA - Microalbumin   Collection Time: 02/26/24 10:37 AM  Result Value Ref Range   Microalbumin Ur, POC 80 mg/L   Creatinine, POC 300 mg/dL   Albumin/Creatinine Ratio, Urine, POC 30-300       Physical Exam Constitutional:      Appearance: Normal appearance.  HENT:     Head: Normocephalic.  Cardiovascular:     Rate and Rhythm: Normal rate and  regular rhythm.  Pulmonary:     Effort: Pulmonary effort is normal.     Breath sounds: Normal breath sounds.  Neurological:     General: No focal deficit present.     Mental Status: She is alert and oriented to person, place, and time.  Psychiatric:        Mood and Affect: Mood normal.        The 10-year ASCVD risk score (Arnett DK, et al., 2019) is: 4.9%    Assessment & Plan:  Aaron AasAaron AasAmy was seen today for medical management of chronic issues.  Diagnoses and all orders for this visit:  Type II diabetes mellitus with nephropathy (HCC) -     POCT HgB A1C -     POCT UA - Microalbumin  Overweight (BMI 25.0-29.9)  Vitamin D  deficiency  Appetite increase  Primary insomnia -     Suvorexant (BELSOMRA) 10 MG TABS; Take 1 tablet (10 mg total) by mouth at bedtime.   A1C to goal Continue same medications Work on better DM diet and regular exercise BP to goal  Abnormal microalbumin, discussed considering ACE/ARB or SGLT-2 Cmp ordered Declined today Declined statin Declined vaccines Needs  eye exam Follow up in 3 months  Discussed good sleep routine Failed ambien due to SE, melatonin not effective Trial of belsomnra  Discussed side effects  Follow up in 3 months  Pain stable, no concerns.   Return in about 3 months (around 05/28/2024).    Shonita Rinck, PA-C

## 2024-02-28 DIAGNOSIS — R809 Proteinuria, unspecified: Secondary | ICD-10-CM | POA: Insufficient documentation

## 2024-02-28 MED ORDER — OZEMPIC (2 MG/DOSE) 8 MG/3ML ~~LOC~~ SOPN
2.0000 mg | PEN_INJECTOR | SUBCUTANEOUS | 0 refills | Status: DC
  Filled 2024-02-28: qty 9, 84d supply, fill #0
  Filled 2024-05-16: qty 3, 28d supply, fill #0
  Filled 2024-06-20: qty 3, 28d supply, fill #1
  Filled 2024-07-19: qty 3, 28d supply, fill #2

## 2024-02-29 ENCOUNTER — Ambulatory Visit: Admitting: Physician Assistant

## 2024-02-29 ENCOUNTER — Other Ambulatory Visit (HOSPITAL_BASED_OUTPATIENT_CLINIC_OR_DEPARTMENT_OTHER): Payer: Self-pay

## 2024-02-29 ENCOUNTER — Other Ambulatory Visit (HOSPITAL_COMMUNITY): Payer: Self-pay | Admitting: Psychiatry

## 2024-02-29 ENCOUNTER — Encounter: Payer: Self-pay | Admitting: Physician Assistant

## 2024-02-29 DIAGNOSIS — F411 Generalized anxiety disorder: Secondary | ICD-10-CM

## 2024-02-29 DIAGNOSIS — M1711 Unilateral primary osteoarthritis, right knee: Secondary | ICD-10-CM | POA: Diagnosis not present

## 2024-02-29 DIAGNOSIS — F410 Panic disorder [episodic paroxysmal anxiety] without agoraphobia: Secondary | ICD-10-CM

## 2024-02-29 MED ORDER — HYALURONAN 30 MG/2ML IX SOSY
30.0000 mg | PREFILLED_SYRINGE | INTRA_ARTICULAR | Status: AC | PRN
  Administered 2024-02-29: 30 mg via INTRA_ARTICULAR

## 2024-02-29 NOTE — Progress Notes (Signed)
   Procedure Note  Patient: Shannon [name]             Date of Birth: Apr 10, 1966           MRN: [id number]             Visit Date: 02/29/2024  HPI: Shannon [name] returns today for second Orthovisc injection right knee.  She states overall she is had no significant relief or any adverse effect.  Physical exam: Right knee slight ecchymosis injections.  No abnormal warmth erythema or effusion.  Overall good range of motion of the knee.  Ambulates without any assistive. Procedures: Visit Diagnoses:  1. Primary osteoarthritis of right knee     Large Joint Inj: R knee on 02/29/2024 8:34 AM Indications: pain Details: 22 G 1.5 in needle, anterolateral approach  Arthrogram: No  Medications: 30 mg Hyaluronan 30 MG/2ML Outcome: tolerated well, no immediate complications Procedure, treatment alternatives, risks and benefits explained, specific risks discussed. Consent was given by the patient. Immediately prior to procedure a time out was called to verify the correct patient, procedure, equipment, support staff and site/side marked as required. Patient was prepped and draped in the usual sterile fashion.      Plan: Will see her back for her third Orthovisc injection in 1 week.  Questions were encouraged and answered.

## 2024-03-01 ENCOUNTER — Other Ambulatory Visit: Payer: Self-pay

## 2024-03-01 ENCOUNTER — Other Ambulatory Visit (HOSPITAL_BASED_OUTPATIENT_CLINIC_OR_DEPARTMENT_OTHER): Payer: Self-pay

## 2024-03-01 MED ORDER — LAMOTRIGINE 25 MG PO TABS
50.0000 mg | ORAL_TABLET | Freq: Every day | ORAL | 0 refills | Status: DC
  Filled 2024-03-01: qty 60, 30d supply, fill #0

## 2024-03-01 MED ORDER — ALPRAZOLAM 0.5 MG PO TABS
0.5000 mg | ORAL_TABLET | Freq: Every day | ORAL | 0 refills | Status: DC | PRN
  Filled 2024-03-01: qty 30, 30d supply, fill #0

## 2024-03-01 MED ORDER — LAMOTRIGINE 200 MG PO TABS
200.0000 mg | ORAL_TABLET | Freq: Every day | ORAL | 0 refills | Status: DC
  Filled 2024-03-01: qty 30, 30d supply, fill #0

## 2024-03-03 ENCOUNTER — Ambulatory Visit

## 2024-03-03 DIAGNOSIS — Z1231 Encounter for screening mammogram for malignant neoplasm of breast: Secondary | ICD-10-CM

## 2024-03-04 ENCOUNTER — Other Ambulatory Visit (HOSPITAL_BASED_OUTPATIENT_CLINIC_OR_DEPARTMENT_OTHER): Payer: Self-pay

## 2024-03-07 ENCOUNTER — Ambulatory Visit: Admitting: Physician Assistant

## 2024-03-07 DIAGNOSIS — M1711 Unilateral primary osteoarthritis, right knee: Secondary | ICD-10-CM

## 2024-03-07 MED ORDER — HYALURONAN 30 MG/2ML IX SOSY
30.0000 mg | PREFILLED_SYRINGE | INTRA_ARTICULAR | Status: AC | PRN
  Administered 2024-03-07: 30 mg via INTRA_ARTICULAR

## 2024-03-07 NOTE — Progress Notes (Signed)
   Procedure Note  Patient: Shannon [name]             Date of Birth: 12/23/1965           MRN: [id number]             Visit Date: 03/07/2024 HPI: Oyinkansola returns today for her third Orthovisc injection right knee.  She states she has had no real relief as of yet.  No adverse effects.  Physical exam: Right knee no abnormal warmth erythema.  Good range of motion.  Procedures: Visit Diagnoses:  1. Primary osteoarthritis of right knee     Large Joint Inj: R knee on 03/07/2024 8:26 AM Indications: pain Details: 22 G 1.5 in needle, anterolateral approach  Arthrogram: No  Medications: 30 mg Hyaluronan 30 MG/2ML Outcome: tolerated well, no immediate complications Procedure, treatment alternatives, risks and benefits explained, specific risks discussed. Consent was given by the patient. Immediately prior to procedure a time out was called to verify the correct patient, procedure, equipment, support staff and site/side marked as required. Patient was prepped and draped in the usual sterile fashion.     Plan: She knows to wait at least 6 months between viscosupplementation injections.  Questions were encouraged and answered.

## 2024-03-08 ENCOUNTER — Ambulatory Visit: Payer: Self-pay | Admitting: Physician Assistant

## 2024-03-08 NOTE — Progress Notes (Signed)
 Normal mammogram. Follow up in 1 year.

## 2024-03-24 ENCOUNTER — Ambulatory Visit (HOSPITAL_COMMUNITY): Admitting: Licensed Clinical Social Worker

## 2024-03-28 ENCOUNTER — Other Ambulatory Visit (HOSPITAL_COMMUNITY): Payer: Self-pay

## 2024-03-28 ENCOUNTER — Other Ambulatory Visit (HOSPITAL_COMMUNITY): Payer: Self-pay | Admitting: Psychiatry

## 2024-03-28 DIAGNOSIS — F331 Major depressive disorder, recurrent, moderate: Secondary | ICD-10-CM

## 2024-03-28 MED ORDER — VILAZODONE HCL 10 MG PO TABS
10.0000 mg | ORAL_TABLET | Freq: Every day | ORAL | 1 refills | Status: DC
  Filled 2024-03-28: qty 30, 30d supply, fill #0
  Filled 2024-04-29: qty 30, 30d supply, fill #1

## 2024-03-28 MED ORDER — CITALOPRAM HYDROBROMIDE 40 MG PO TABS
40.0000 mg | ORAL_TABLET | Freq: Every day | ORAL | 2 refills | Status: DC
  Filled 2024-03-28 – 2024-03-31 (×2): qty 30, 30d supply, fill #0
  Filled 2024-04-29: qty 30, 30d supply, fill #1

## 2024-03-29 ENCOUNTER — Other Ambulatory Visit (HOSPITAL_COMMUNITY): Payer: Self-pay

## 2024-03-29 ENCOUNTER — Other Ambulatory Visit: Payer: Self-pay

## 2024-03-31 ENCOUNTER — Other Ambulatory Visit (HOSPITAL_COMMUNITY): Payer: Self-pay | Admitting: Psychiatry

## 2024-03-31 ENCOUNTER — Other Ambulatory Visit (HOSPITAL_COMMUNITY): Payer: Self-pay

## 2024-03-31 ENCOUNTER — Other Ambulatory Visit: Payer: Self-pay

## 2024-03-31 MED ORDER — LAMOTRIGINE 200 MG PO TABS
200.0000 mg | ORAL_TABLET | Freq: Every day | ORAL | 0 refills | Status: DC
  Filled 2024-03-31: qty 30, 30d supply, fill #0

## 2024-03-31 MED ORDER — LAMOTRIGINE 25 MG PO TABS
50.0000 mg | ORAL_TABLET | Freq: Every day | ORAL | 0 refills | Status: DC
  Filled 2024-03-31: qty 60, 30d supply, fill #0

## 2024-04-01 ENCOUNTER — Other Ambulatory Visit: Payer: Self-pay

## 2024-04-06 ENCOUNTER — Ambulatory Visit (HOSPITAL_COMMUNITY): Admitting: Licensed Clinical Social Worker

## 2024-04-07 ENCOUNTER — Ambulatory Visit (HOSPITAL_COMMUNITY): Admitting: Licensed Clinical Social Worker

## 2024-04-21 ENCOUNTER — Ambulatory Visit (HOSPITAL_COMMUNITY): Admitting: Licensed Clinical Social Worker

## 2024-04-29 ENCOUNTER — Other Ambulatory Visit (HOSPITAL_COMMUNITY): Payer: Self-pay | Admitting: Psychiatry

## 2024-04-29 ENCOUNTER — Other Ambulatory Visit: Payer: Self-pay

## 2024-04-29 DIAGNOSIS — F410 Panic disorder [episodic paroxysmal anxiety] without agoraphobia: Secondary | ICD-10-CM

## 2024-04-29 DIAGNOSIS — F411 Generalized anxiety disorder: Secondary | ICD-10-CM

## 2024-04-30 ENCOUNTER — Other Ambulatory Visit: Payer: Self-pay | Admitting: Physician Assistant

## 2024-04-30 DIAGNOSIS — M7918 Myalgia, other site: Secondary | ICD-10-CM

## 2024-05-02 ENCOUNTER — Other Ambulatory Visit: Payer: Self-pay

## 2024-05-02 ENCOUNTER — Other Ambulatory Visit (HOSPITAL_COMMUNITY): Payer: Self-pay

## 2024-05-02 ENCOUNTER — Other Ambulatory Visit (HOSPITAL_BASED_OUTPATIENT_CLINIC_OR_DEPARTMENT_OTHER): Payer: Self-pay

## 2024-05-02 MED ORDER — ALPRAZOLAM 0.5 MG PO TABS
0.5000 mg | ORAL_TABLET | Freq: Every day | ORAL | 2 refills | Status: DC | PRN
  Filled 2024-05-02: qty 30, 30d supply, fill #0
  Filled 2024-06-20: qty 30, 30d supply, fill #1
  Filled 2024-08-25: qty 30, 30d supply, fill #2

## 2024-05-02 MED ORDER — LAMOTRIGINE 25 MG PO TABS
50.0000 mg | ORAL_TABLET | Freq: Every day | ORAL | 0 refills | Status: DC
  Filled 2024-05-02: qty 60, 30d supply, fill #0

## 2024-05-02 MED ORDER — LAMOTRIGINE 200 MG PO TABS
200.0000 mg | ORAL_TABLET | Freq: Every day | ORAL | 0 refills | Status: DC
  Filled 2024-05-02: qty 30, 30d supply, fill #0

## 2024-05-04 ENCOUNTER — Ambulatory Visit (HOSPITAL_COMMUNITY): Admitting: Licensed Clinical Social Worker

## 2024-05-05 ENCOUNTER — Ambulatory Visit (HOSPITAL_COMMUNITY): Admitting: Licensed Clinical Social Worker

## 2024-05-07 DIAGNOSIS — G4733 Obstructive sleep apnea (adult) (pediatric): Secondary | ICD-10-CM | POA: Diagnosis not present

## 2024-05-09 ENCOUNTER — Other Ambulatory Visit (HOSPITAL_BASED_OUTPATIENT_CLINIC_OR_DEPARTMENT_OTHER): Payer: Self-pay

## 2024-05-09 ENCOUNTER — Other Ambulatory Visit (HOSPITAL_COMMUNITY): Payer: Self-pay

## 2024-05-09 ENCOUNTER — Other Ambulatory Visit: Payer: Self-pay | Admitting: Physician Assistant

## 2024-05-09 ENCOUNTER — Encounter: Payer: Self-pay | Admitting: Physician Assistant

## 2024-05-09 ENCOUNTER — Encounter (HOSPITAL_COMMUNITY): Payer: Self-pay

## 2024-05-09 DIAGNOSIS — M7918 Myalgia, other site: Secondary | ICD-10-CM

## 2024-05-09 MED ORDER — PREGABALIN 100 MG PO CAPS
100.0000 mg | ORAL_CAPSULE | Freq: Two times a day (BID) | ORAL | 0 refills | Status: DC
  Filled 2024-05-09: qty 180, 90d supply, fill #0

## 2024-05-09 NOTE — Telephone Encounter (Signed)
 Rx pended.  Last OV: 02/26/24 Next OV: 05/31/24 Last RF: 09/29/23

## 2024-05-10 ENCOUNTER — Telehealth: Payer: Self-pay

## 2024-05-10 ENCOUNTER — Other Ambulatory Visit: Payer: Self-pay

## 2024-05-10 ENCOUNTER — Other Ambulatory Visit (HOSPITAL_BASED_OUTPATIENT_CLINIC_OR_DEPARTMENT_OTHER): Payer: Self-pay

## 2024-05-10 NOTE — Telephone Encounter (Signed)
Medication was sent in yesterday.  

## 2024-05-10 NOTE — Telephone Encounter (Signed)
 Copied from CRM [id number]. Topic: Clinical - Prescription Issue >> May 09, 2024  1:44 PM Diannia H wrote: Reason for CRM: Patient is on her last two pills as of 07/21 and she is needing her rx filled, she sent a request in for a refill, can you call her and tell her the status of the medicine pregabalin  (LYRICA ) 100 MG capsule, callback number is [phone]

## 2024-05-11 ENCOUNTER — Encounter (HOSPITAL_COMMUNITY): Payer: Self-pay | Admitting: Psychiatry

## 2024-05-11 ENCOUNTER — Telehealth (HOSPITAL_COMMUNITY): Payer: Commercial Managed Care - PPO | Admitting: Psychiatry

## 2024-05-11 DIAGNOSIS — F410 Panic disorder [episodic paroxysmal anxiety] without agoraphobia: Secondary | ICD-10-CM | POA: Diagnosis not present

## 2024-05-11 DIAGNOSIS — F411 Generalized anxiety disorder: Secondary | ICD-10-CM | POA: Diagnosis not present

## 2024-05-11 DIAGNOSIS — F331 Major depressive disorder, recurrent, moderate: Secondary | ICD-10-CM

## 2024-05-11 MED ORDER — CITALOPRAM HYDROBROMIDE 20 MG PO TABS: 20.0000 mg | ORAL_TABLET | Freq: Every day | ORAL | Status: DC

## 2024-05-11 NOTE — Progress Notes (Signed)
 Patient ID: Shannon [name], female   DOB: [birth year], 58 y.o.   MRN: [id number]  [facility] Outpatient Follow up visit  Shannon [name] [id number] 58 y.o.  05/11/2024 1:49 PM  Virtual Visit via Video Note  I connected with Shannon [name] on 05/11/24 at  1:30 PM EDT by a video enabled telemedicine application and verified that I am speaking with the correct person using two identifiers.  Location: Patient: home Provider: home office   I discussed the limitations of evaluation and management by telemedicine and the availability of in person appointments. The patient expressed understanding and agreed to proceed.      I discussed the assessment and treatment plan with the patient. The patient was provided an opportunity to ask questions and all were answered. The patient agreed with the plan and demonstrated an understanding of the instructions.   The patient was advised to call back or seek an in-person evaluation if the symptoms worsen or if the condition fails to improve as anticipated.  I provided 25 minutes of non-face-to-face time during this encounter.     Chief Complaint:  Depression follow up     HPI: Works as Buyer, retail over the weekend  On evaluation states doing okay but then mentions that she feels somewhat numb at times not having too many emotions and she was to cut down some medications.  Her Viibryd  dose is 10 mg Celexa  40 mg she is also on Lamictal  200+ but she does not want to cut down the Lamictal   Some regular stressors plans to retire in 6 years    Mom is good support  Modifying factor: son, mom Aggravating factor : Job can be   Duration 8 years plus Severity feels somewhat numb   Past Psychiatric History/Hospitalization(s) Manged for depression and mood symptoms for more then 20 years.  Prior Suicide Attempts: No  Medical History; Past Medical History:  Diagnosis Date   ADHD (attention deficit hyperactivity disorder)    Anxiety     Arthritis    back and hips    Depression    Diabetes mellitus type II, controlled (HCC) 08/15/2014   GERD (gastroesophageal reflux disease)    HPV (human papilloma virus) infection    Hyperlipidemia    Lateral meniscus tear    left knee   Menopausal state    PONV (postoperative nausea and vomiting)    slow to wake up    Pre-diabetes    Shortness of breath dyspnea    with exertion    Sleep apnea    cpap -0 setting at 14, uses CPAP nightly   Stress incontinence     Allergies: Allergies  Allergen Reactions   Rosuvastatin      MUSCLE ACHES   Food Swelling    Big Red Gum makes her tongue swell   Latuda [Lurasidone Hcl] Other (See Comments)    Severe aggitation   Lipitor [Atorvastatin] Other (See Comments)    Muscle aches   Atorvastatin Calcium     Belviq [Lorcaserin  Hcl]     Increased appetitie   Contrave  [Naltrexone -Bupropion  Hcl Er] Other (See Comments)    Sleepy.    Medications: Outpatient Encounter Medications as of 05/11/2024  Medication Sig   acetaminophen  (TYLENOL ) 500 MG tablet Take 1 tablet (500 mg total) by mouth every 6 (six) hours as needed.   ALPRAZolam  (XANAX ) 0.5 MG tablet Take 1 tablet (0.5 mg total) by mouth daily as needed.   Blood Glucose Monitoring Suppl (FREESTYLE LITE) w/Device KIT  USE AS DIRECTED TO TEST BLOOD SUGAR 4 TIMES A DAY AS DIRECTED   citalopram  (CELEXA ) 20 MG tablet Take 1 tablet (20 mg total) by mouth daily.   ferrous sulfate 325 (65 FE) MG tablet Take 325 mg by mouth daily with breakfast.   glucose blood (FREESTYLE LITE) test strip Use as directed to test blood sugar 4 times daily   ibuprofen  (ADVIL ) 800 MG tablet Take 1 tablet (800 mg total) by mouth 3 (three) times daily with meals as needed for headache, moderate pain or cramping.   lamoTRIgine  (LAMICTAL ) 200 MG tablet Take 1 tablet (200 mg total) by mouth daily.   lamoTRIgine  (LAMICTAL ) 25 MG tablet Take 2 tablets (50 mg total) by mouth daily. Adding 50mg  a day in addition to 200mg     Lancets (FREESTYLE) lancets Use as directed to test blood sugar 4 times daily   mirabegron  ER (MYRBETRIQ ) 50 MG TB24 tablet Take 1 tablet (50 mg total) by mouth daily.   Na Sulfate-K Sulfate-Mg Sulf 17.5-3.13-1.6 GM/177ML SOLN Take 177 mLs by mouth 2 (two) times. Please refer to prep instructions sent by [facility]   ondansetron  (ZOFRAN -ODT) 8 MG disintegrating tablet Take 1 tablet (8 mg total) by mouth every 8 (eight) hours as needed.   pregabalin  (LYRICA ) 100 MG capsule Take 1 capsule (100 mg total) by mouth 2 (two) times daily.   Semaglutide , 2 MG/DOSE, (OZEMPIC , 2 MG/DOSE,) 8 MG/3ML SOPN Inject 2 mg into the skin once a week.   traZODone  (DESYREL ) 50 MG tablet Take 0.5-1 tablets (25-50 mg total) by mouth at bedtime as needed for sleep.   Vilazodone  HCl (VIIBRYD ) 10 MG TABS Take 1 tablet (10 mg total) by mouth daily.   Vitamin D , Ergocalciferol , (DRISDOL ) 1.25 MG (50000 UNIT) CAPS capsule Take 1 capsule (50,000 Units total) by mouth every 7 (seven) days.   [DISCONTINUED] buPROPion  ER (WELLBUTRIN  SR) 100 MG 12 hr tablet Take 1 tablet (100 mg total) by mouth daily.   [DISCONTINUED] citalopram  (CELEXA ) 40 MG tablet Take 1 tablet (40 mg total) by mouth daily.   [DISCONTINUED] Suvorexant  (BELSOMRA ) 10 MG TABS Take 1 tablet (10 mg total) by mouth at bedtime.   No facility-administered encounter medications on file as of 05/11/2024.     Family History; Family History  Problem Relation Age of Onset   Hyperlipidemia Mother    Sleep apnea Mother    Depression Mother    Kidney disease Mother    Diabetes Father    Hypertension Father    COPD Father    Depression Sister    Alcohol abuse Paternal Grandfather    Diabetes Paternal Aunt    Diabetes Paternal Uncle    Alcohol abuse Paternal Uncle    Leukemia Paternal Uncle       Labs:  Recent Results (from the past 2160 hours)  POCT HgB A1C     Status: Abnormal   Collection Time: 02/26/24 10:36 AM  Result Value Ref Range   Hemoglobin A1C 5.1 4.0 -  5.6 %   HbA1c POC (<> result, manual entry)     HbA1c, POC (prediabetic range)     HbA1c, POC (controlled diabetic range)    POCT UA - Microalbumin     Status: Abnormal   Collection Time: 02/26/24 10:37 AM  Result Value Ref Range   Microalbumin Ur, POC 80 mg/L   Creatinine, POC 300 mg/dL   Albumin/Creatinine Ratio, Urine, POC 30-300            Mental Status Examination;  Psychiatric Specialty Exam: Physical Exam  Review of Systems  Cardiovascular:  Negative for chest pain.  Neurological:  Negative for tremors.  Psychiatric/Behavioral:  Negative for hallucinations.     Last menstrual period 12/17/2016.There is no height or weight on file to calculate BMI.  General Appearance:casual  Eye Contact::  fair  Speech:  Normal Rate  Volume:  Normal  Mood :fair  Affect:  congruent  Thought Process:  Coherent  Orientation:  Full (Time, Place, and Person)  Thought Content:  Rumination  Suicidal Thoughts:  No  Homicidal Thoughts:  No  Memory:  Immediate;   Fair Recent;   Fair  Judgement:  Fair  Insight:  Shallow  Psychomotor Activity:  Normal  Concentration:  Fair  Recall:  Fair  Akathisia:  Negative  Handed:  Right  AIMS (if indicated):     Assets:  Communication Skills Desire for Improvement Financial Resources/Insurance Housing  Sleep:        Assessment: Axis I: mood disorder unspecified. Rule out mood disorder secondary to general medical condition. Major depressive disorder recurrent moderate. Rule out panic disorder. Generalized anxiety disorder   Axis III:  Past Medical History:  Diagnosis Date   ADHD (attention deficit hyperactivity disorder)    Anxiety    Arthritis    back and hips    Depression    Diabetes mellitus type II, controlled (HCC) 08/15/2014   GERD (gastroesophageal reflux disease)    HPV (human papilloma virus) infection    Hyperlipidemia    Lateral meniscus tear    left knee   Menopausal state    PONV (postoperative nausea and  vomiting)    slow to wake up    Pre-diabetes    Shortness of breath dyspnea    with exertion    Sleep apnea    cpap -0 setting at 14, uses CPAP nightly   Stress incontinence     Axis IV: psychosocial   Treatment Plan and Summary: Prior documentation reviewed    Depression: States doing fair but feels numb and regarding her emotions we will cut down citalopram  from 40 to 30 mg with too much to cut down to 20 mg continue Lamictal  at the current dose she is also on Viibryd  10 mg She made talk with her primary care physician if she can reduce down the Lyrica  as a second option after this  There is no rash reported Anxiety is stressed out overall trying to add activities and working over the weekend.  She is on citalopram  we will reduce it to 20 mg she is also on Xanax  as needed if needed consider therapy to work on stress level  Panic attacks : Sporadic for baseline continue breathing techniques and Xanax  as needed Follow-up in 1 or 2 months or earlier if needed she will notify us  if there is any discussion to be done earlier she understands medication adjustment may take some time and may affect her mood or depression   , Jackey Flight, MD 05/11/2024

## 2024-05-17 ENCOUNTER — Other Ambulatory Visit: Payer: Self-pay

## 2024-05-18 ENCOUNTER — Ambulatory Visit (HOSPITAL_COMMUNITY): Admitting: Licensed Clinical Social Worker

## 2024-05-19 ENCOUNTER — Encounter (HOSPITAL_COMMUNITY): Payer: Self-pay | Admitting: Licensed Clinical Social Worker

## 2024-05-19 ENCOUNTER — Other Ambulatory Visit (HOSPITAL_COMMUNITY): Payer: Self-pay

## 2024-05-19 ENCOUNTER — Ambulatory Visit (HOSPITAL_COMMUNITY): Admitting: Licensed Clinical Social Worker

## 2024-05-31 ENCOUNTER — Ambulatory Visit: Admitting: Physician Assistant

## 2024-05-31 DIAGNOSIS — E1121 Type 2 diabetes mellitus with diabetic nephropathy: Secondary | ICD-10-CM

## 2024-06-08 ENCOUNTER — Other Ambulatory Visit: Payer: Self-pay

## 2024-06-08 ENCOUNTER — Other Ambulatory Visit (HOSPITAL_COMMUNITY): Payer: Self-pay | Admitting: Psychiatry

## 2024-06-08 ENCOUNTER — Other Ambulatory Visit (HOSPITAL_COMMUNITY): Payer: Self-pay

## 2024-06-08 MED ORDER — LAMOTRIGINE 200 MG PO TABS
200.0000 mg | ORAL_TABLET | Freq: Every day | ORAL | 0 refills | Status: DC
  Filled 2024-06-08: qty 30, 30d supply, fill #0

## 2024-06-08 MED ORDER — LAMOTRIGINE 25 MG PO TABS
50.0000 mg | ORAL_TABLET | Freq: Every day | ORAL | 0 refills | Status: DC
  Filled 2024-06-08: qty 60, 30d supply, fill #0

## 2024-06-08 MED ORDER — VILAZODONE HCL 10 MG PO TABS
10.0000 mg | ORAL_TABLET | Freq: Every day | ORAL | 1 refills | Status: DC
  Filled 2024-06-08: qty 30, 30d supply, fill #0
  Filled 2024-07-04: qty 30, 30d supply, fill #1

## 2024-06-09 ENCOUNTER — Other Ambulatory Visit: Payer: Self-pay

## 2024-06-17 ENCOUNTER — Encounter: Admitting: Physician Assistant

## 2024-06-20 ENCOUNTER — Other Ambulatory Visit: Payer: Self-pay | Admitting: Physician Assistant

## 2024-06-21 ENCOUNTER — Other Ambulatory Visit: Payer: Self-pay

## 2024-06-21 ENCOUNTER — Other Ambulatory Visit (HOSPITAL_BASED_OUTPATIENT_CLINIC_OR_DEPARTMENT_OTHER): Payer: Self-pay

## 2024-06-21 ENCOUNTER — Encounter: Payer: Self-pay | Admitting: Sports Medicine

## 2024-06-21 MED ORDER — TRAZODONE HCL 50 MG PO TABS
25.0000 mg | ORAL_TABLET | Freq: Every evening | ORAL | 2 refills | Status: AC | PRN
  Filled 2024-06-21: qty 30, 30d supply, fill #0
  Filled 2024-07-31: qty 30, 30d supply, fill #1
  Filled 2024-08-29: qty 30, 30d supply, fill #2

## 2024-07-04 ENCOUNTER — Other Ambulatory Visit: Payer: Self-pay

## 2024-07-04 ENCOUNTER — Other Ambulatory Visit (HOSPITAL_COMMUNITY): Payer: Self-pay | Admitting: Psychiatry

## 2024-07-04 ENCOUNTER — Other Ambulatory Visit (HOSPITAL_COMMUNITY): Payer: Self-pay

## 2024-07-04 MED ORDER — LAMOTRIGINE 25 MG PO TABS
50.0000 mg | ORAL_TABLET | Freq: Every day | ORAL | 0 refills | Status: DC
  Filled 2024-07-04: qty 60, 30d supply, fill #0

## 2024-07-04 MED ORDER — LAMOTRIGINE 200 MG PO TABS
200.0000 mg | ORAL_TABLET | Freq: Every day | ORAL | 0 refills | Status: DC
  Filled 2024-07-04: qty 30, 30d supply, fill #0

## 2024-07-05 ENCOUNTER — Other Ambulatory Visit: Payer: Self-pay

## 2024-07-27 ENCOUNTER — Telehealth (HOSPITAL_COMMUNITY): Admitting: Psychiatry

## 2024-07-28 ENCOUNTER — Telehealth (INDEPENDENT_AMBULATORY_CARE_PROVIDER_SITE_OTHER): Admitting: Psychiatry

## 2024-07-28 ENCOUNTER — Other Ambulatory Visit: Payer: Self-pay

## 2024-07-28 ENCOUNTER — Encounter (HOSPITAL_COMMUNITY): Payer: Self-pay | Admitting: Psychiatry

## 2024-07-28 ENCOUNTER — Other Ambulatory Visit (HOSPITAL_BASED_OUTPATIENT_CLINIC_OR_DEPARTMENT_OTHER): Payer: Self-pay

## 2024-07-28 DIAGNOSIS — F411 Generalized anxiety disorder: Secondary | ICD-10-CM

## 2024-07-28 DIAGNOSIS — F331 Major depressive disorder, recurrent, moderate: Secondary | ICD-10-CM

## 2024-07-28 DIAGNOSIS — F39 Unspecified mood [affective] disorder: Secondary | ICD-10-CM

## 2024-07-28 DIAGNOSIS — F410 Panic disorder [episodic paroxysmal anxiety] without agoraphobia: Secondary | ICD-10-CM

## 2024-07-28 MED ORDER — LAMOTRIGINE 25 MG PO TABS
50.0000 mg | ORAL_TABLET | Freq: Every day | ORAL | 0 refills | Status: DC
  Filled 2024-07-28: qty 60, 30d supply, fill #0

## 2024-07-28 MED ORDER — LAMOTRIGINE 200 MG PO TABS
200.0000 mg | ORAL_TABLET | Freq: Every day | ORAL | 0 refills | Status: DC
  Filled 2024-07-28: qty 30, 30d supply, fill #0

## 2024-07-28 MED ORDER — CITALOPRAM HYDROBROMIDE 20 MG PO TABS
20.0000 mg | ORAL_TABLET | Freq: Every day | ORAL | 1 refills | Status: DC
  Filled 2024-07-28: qty 30, 30d supply, fill #0
  Filled 2024-08-22: qty 30, 30d supply, fill #1

## 2024-07-28 NOTE — Progress Notes (Signed)
 Patient ID: Shannon [name], female   DOB: 14-Oct-1966, 58 y.o.   MRN: [id number]  [facility] Outpatient Follow up visit  Shannon [name] [id number] 58 y.o.  07/28/2024 10:08 AM Virtual Visit via Video Note  I connected with Shannon [name] on 07/28/24 at 10:00 AM EDT by a video enabled telemedicine application and verified that I am speaking with the correct person using two identifiers.  Location: Patient: home Provider: home office   I discussed the limitations of evaluation and management by telemedicine and the availability of in person appointments. The patient expressed understanding and agreed to proceed.      I discussed the assessment and treatment plan with the patient. The patient was provided an opportunity to ask questions and all were answered. The patient agreed with the plan and demonstrated an understanding of the instructions.   The patient was advised to call back or seek an in-person evaluation if the symptoms worsen or if the condition fails to improve as anticipated.  I provided 18 minutes of non-face-to-face time during this encounter.    Chief Complaint:  Depression follow up     HPI: Works as Buyer, retail over the weekend Last visit she was feeling normal or foggy we cut down citalopram  from 40 mg to 20 mg she was also cut down the Lyrica  dose and seldom uses Xanax  and that has helped she feels more clear and more alert she feels her depression is still controlled and there is no reported rash on Lamictal  her mood is stable  She has a boyfriend now of 3 months both of them help ease her out  Patient is planning to change job as she is nearing retirement and wants to have a different job structure as well stress related to job     Mom is good support  Modifying factor: Family Aggravating factor : Job can be   Duration 8 years plus Severity improved   Past Psychiatric History/Hospitalization(s) Manged for depression and mood symptoms for  more then 20 years.  Prior Suicide Attempts: No  Medical History; Past Medical History:  Diagnosis Date   ADHD (attention deficit hyperactivity disorder)    Anxiety    Arthritis    back and hips    Depression    Diabetes mellitus type II, controlled (HCC) 08/15/2014   GERD (gastroesophageal reflux disease)    HPV (human papilloma virus) infection    Hyperlipidemia    Lateral meniscus tear    left knee   Menopausal state    PONV (postoperative nausea and vomiting)    slow to wake up    Pre-diabetes    Shortness of breath dyspnea    with exertion    Sleep apnea    cpap -0 setting at 16, uses CPAP nightly   Stress incontinence     Allergies: Allergies  Allergen Reactions   Rosuvastatin      MUSCLE ACHES   Food Swelling    Big Red Gum makes her tongue swell   Latuda [Lurasidone Hcl] Other (See Comments)    Severe aggitation   Lipitor [Atorvastatin] Other (See Comments)    Muscle aches   Atorvastatin Calcium     Belviq [Lorcaserin  Hcl]     Increased appetitie   Contrave  [Naltrexone -Bupropion  Hcl Er] Other (See Comments)    Sleepy.    Medications: Outpatient Encounter Medications as of 07/28/2024  Medication Sig   acetaminophen  (TYLENOL ) 500 MG tablet Take 1 tablet (500 mg total) by mouth every 6 (  six) hours as needed.   ALPRAZolam  (XANAX ) 0.5 MG tablet Take 1 tablet (0.5 mg total) by mouth daily as needed.   Blood Glucose Monitoring Suppl (FREESTYLE LITE) w/Device KIT USE AS DIRECTED TO TEST BLOOD SUGAR 4 TIMES A DAY AS DIRECTED   citalopram  (CELEXA ) 20 MG tablet Take 1 tablet (20 mg total) by mouth daily.   ferrous sulfate 325 (65 FE) MG tablet Take 325 mg by mouth daily with breakfast.   glucose blood (FREESTYLE LITE) test strip Use as directed to test blood sugar 4 times daily   ibuprofen  (ADVIL ) 800 MG tablet Take 1 tablet (800 mg total) by mouth 3 (three) times daily with meals as needed for headache, moderate pain or cramping.   lamoTRIgine  (LAMICTAL ) 200 MG  tablet Take 1 tablet (200 mg total) by mouth daily.   lamoTRIgine  (LAMICTAL ) 25 MG tablet Take 2 tablets (50 mg total) by mouth daily. Adding 50mg  a day in addition to 200mg    Lancets (FREESTYLE) lancets Use as directed to test blood sugar 4 times daily   mirabegron  ER (MYRBETRIQ ) 50 MG TB24 tablet Take 1 tablet (50 mg total) by mouth daily.   Na Sulfate-K Sulfate-Mg Sulf 17.5-3.13-1.6 GM/177ML SOLN Take 177 mLs by mouth 2 (two) times. Please refer to prep instructions sent by [facility]   ondansetron  (ZOFRAN -ODT) 8 MG disintegrating tablet Take 1 tablet (8 mg total) by mouth every 8 (eight) hours as needed.   pregabalin  (LYRICA ) 100 MG capsule Take 1 capsule (100 mg total) by mouth 2 (two) times daily.   Semaglutide , 2 MG/DOSE, (OZEMPIC , 2 MG/DOSE,) 8 MG/3ML SOPN Inject 2 mg into the skin once a week.   traZODone  (DESYREL ) 50 MG tablet Take 0.5-1 tablets (25-50 mg total) by mouth at bedtime as needed for sleep.   Vilazodone  HCl (VIIBRYD ) 10 MG TABS Take 1 tablet (10 mg total) by mouth daily.   Vitamin D , Ergocalciferol , (DRISDOL ) 1.25 MG (50000 UNIT) CAPS capsule Take 1 capsule (50,000 Units total) by mouth every 7 (seven) days.   [DISCONTINUED] buPROPion  ER (WELLBUTRIN  SR) 100 MG 12 hr tablet Take 1 tablet (100 mg total) by mouth daily.   [DISCONTINUED] citalopram  (CELEXA ) 20 MG tablet Take 1 tablet (20 mg total) by mouth daily.   [DISCONTINUED] lamoTRIgine  (LAMICTAL ) 200 MG tablet Take 1 tablet (200 mg total) by mouth daily.   [DISCONTINUED] lamoTRIgine  (LAMICTAL ) 25 MG tablet Take 2 tablets (50 mg total) by mouth daily. Adding 50mg  a day in addition to 200mg    No facility-administered encounter medications on file as of 07/28/2024.     Family History; Family History  Problem Relation Age of Onset   Hyperlipidemia Mother    Sleep apnea Mother    Depression Mother    Kidney disease Mother    Diabetes Father    Hypertension Father    COPD Father    Depression Sister    Alcohol abuse Paternal  Grandfather    Diabetes Paternal Aunt    Diabetes Paternal Uncle    Alcohol abuse Paternal Uncle    Leukemia Paternal Uncle       Labs:  No results found for this or any previous visit (from the past 2160 hours).          Mental Status Examination;   Psychiatric Specialty Exam: Physical Exam  Review of Systems  Cardiovascular:  Negative for chest pain.  Neurological:  Negative for tremors.  Psychiatric/Behavioral:  Negative for hallucinations.     Last menstrual period 12/17/2016.There is no  height or weight on file to calculate BMI.  General Appearance:casual  Eye Contact::  fair  Speech:  Normal Rate  Volume:  Normal  Mood :fair  Affect:  congruent  Thought Process:  Coherent  Orientation:  Full (Time, Place, and Person)  Thought Content:  Rumination  Suicidal Thoughts:  No  Homicidal Thoughts:  No  Memory:  Immediate;   Fair Recent;   Fair  Judgement:  Fair  Insight:  Shallow  Psychomotor Activity:  Normal  Concentration:  Fair  Recall:  Fair  Akathisia:  Negative  Handed:  Right  AIMS (if indicated):     Assets:  Communication Skills Desire for Improvement Financial Resources/Insurance Housing  Sleep:        Assessment: Axis I: mood disorder unspecified. Rule out mood disorder secondary to general medical condition. Major depressive disorder recurrent moderate. Rule out panic disorder. Generalized anxiety disorder   Axis III:  Past Medical History:  Diagnosis Date   ADHD (attention deficit hyperactivity disorder)    Anxiety    Arthritis    back and hips    Depression    Diabetes mellitus type II, controlled (HCC) 08/15/2014   GERD (gastroesophageal reflux disease)    HPV (human papilloma virus) infection    Hyperlipidemia    Lateral meniscus tear    left knee   Menopausal state    PONV (postoperative nausea and vomiting)    slow to wake up    Pre-diabetes    Shortness of breath dyspnea    with exertion    Sleep apnea    cpap -0  setting at 14, uses CPAP nightly   Stress incontinence     Axis IV: psychosocial   Treatment Plan and Summary: Prior documentation reviewed    Depression: Improving continue citalopram  20 mg she is less foggy she is also on Viibryd  10 mg has cut down her Xanax . Generalized anxiety disorder; she has a good support system managing anxiety with medications including citalopram .  She is planning to change job to reduce further stress or other options to be tried.  Panic attacks : Sporadic but improved continue Xanax  as needed she is also on citalopram  now 20 mg  , Taequan Stockhausen, MD 07/28/2024

## 2024-07-31 ENCOUNTER — Other Ambulatory Visit: Payer: Self-pay | Admitting: Physician Assistant

## 2024-07-31 DIAGNOSIS — N3281 Overactive bladder: Secondary | ICD-10-CM

## 2024-08-01 ENCOUNTER — Other Ambulatory Visit: Payer: Self-pay

## 2024-08-01 ENCOUNTER — Other Ambulatory Visit (HOSPITAL_COMMUNITY): Payer: Self-pay

## 2024-08-01 MED ORDER — MIRABEGRON ER 50 MG PO TB24
50.0000 mg | ORAL_TABLET | Freq: Every day | ORAL | 0 refills | Status: DC
  Filled 2024-08-01: qty 90, 90d supply, fill #0

## 2024-08-02 ENCOUNTER — Other Ambulatory Visit (HOSPITAL_BASED_OUTPATIENT_CLINIC_OR_DEPARTMENT_OTHER): Payer: Self-pay

## 2024-08-02 ENCOUNTER — Encounter: Payer: Self-pay | Admitting: Physician Assistant

## 2024-08-02 ENCOUNTER — Ambulatory Visit: Admitting: Physician Assistant

## 2024-08-02 ENCOUNTER — Ambulatory Visit (INDEPENDENT_AMBULATORY_CARE_PROVIDER_SITE_OTHER)

## 2024-08-02 VITALS — BP 140/79 | HR 79 | Ht 65.0 in | Wt 173.0 lb

## 2024-08-02 DIAGNOSIS — M545 Low back pain, unspecified: Secondary | ICD-10-CM | POA: Diagnosis not present

## 2024-08-02 DIAGNOSIS — M5137 Other intervertebral disc degeneration, lumbosacral region with discogenic back pain only: Secondary | ICD-10-CM | POA: Diagnosis not present

## 2024-08-02 DIAGNOSIS — M47816 Spondylosis without myelopathy or radiculopathy, lumbar region: Secondary | ICD-10-CM | POA: Diagnosis not present

## 2024-08-02 DIAGNOSIS — M48061 Spinal stenosis, lumbar region without neurogenic claudication: Secondary | ICD-10-CM | POA: Diagnosis not present

## 2024-08-02 DIAGNOSIS — M2550 Pain in unspecified joint: Secondary | ICD-10-CM | POA: Insufficient documentation

## 2024-08-02 DIAGNOSIS — M51362 Other intervertebral disc degeneration, lumbar region with discogenic back pain and lower extremity pain: Secondary | ICD-10-CM | POA: Diagnosis not present

## 2024-08-02 DIAGNOSIS — M2560 Stiffness of unspecified joint, not elsewhere classified: Secondary | ICD-10-CM

## 2024-08-02 MED ORDER — CELECOXIB 200 MG PO CAPS
200.0000 mg | ORAL_CAPSULE | Freq: Every day | ORAL | 2 refills | Status: AC
  Filled 2024-08-02: qty 60, 30d supply, fill #0
  Filled 2024-08-29: qty 60, 30d supply, fill #1
  Filled 2024-10-19: qty 60, 30d supply, fill #2

## 2024-08-02 NOTE — Patient Instructions (Addendum)
 Get xrays.  Get labs.  Trial of celebrex 1 to 2 tablets daily

## 2024-08-02 NOTE — Progress Notes (Signed)
   Established Patient Office Visit  Subjective   Patient ID: Shannon [name], female    DOB: April 17, 1966  Age: 58 y.o. MRN: [id number]  No chief complaint on file.   HPI  {History (Optional):23778}  ROS    Objective:     BP (!) 140/79   Pulse 79   Ht 5' 5 (1.651 m)   Wt 173 lb (78.5 kg)   LMP 12/17/2016 (Exact Date)   SpO2 99%   BMI 28.79 kg/m  BP Readings from Last 3 Encounters:  08/02/24 (!) 140/79  02/26/24 121/71  07/16/23 131/79   Wt Readings from Last 3 Encounters:  08/02/24 173 lb (78.5 kg)  02/26/24 179 lb (81.2 kg)  07/16/23 172 lb 0.6 oz (78 kg)      Physical Exam    The 10-year ASCVD risk score (Arnett DK, et al., 2019) is: 6.5%    Assessment & Plan:  SABRASABRADiagnoses and all orders for this visit:  Polyarthralgia -     ANA,IFA RA Diag Pnl w/rflx Tit/Patn -     Sed Rate (ESR) -     C-reactive protein -     CYCLIC CITRUL PEPTIDE ANTIBODY, IGG/IGA -     CMP14+EGFR -     TSH + free T4 -     Hemoglobin A1c -     CBC w/Diff/Platelet -     HLA-B27 antigen -     DG Lumbar Spine Complete; Future  Acute midline low back pain without sciatica -     ANA,IFA RA Diag Pnl w/rflx Tit/Patn -     Sed Rate (ESR) -     C-reactive protein -     CYCLIC CITRUL PEPTIDE ANTIBODY, IGG/IGA -     CMP14+EGFR -     TSH + free T4 -     Hemoglobin A1c -     CBC w/Diff/Platelet -     HLA-B27 antigen -     DG Lumbar Spine Complete; Future  Morning joint stiffness -     ANA,IFA RA Diag Pnl w/rflx Tit/Patn -     Sed Rate (ESR) -     C-reactive protein -     CYCLIC CITRUL PEPTIDE ANTIBODY, IGG/IGA -     CMP14+EGFR -     TSH + free T4 -     Hemoglobin A1c -     CBC w/Diff/Platelet -     HLA-B27 antigen -     DG Lumbar Spine Complete; Future  Other orders -     celecoxib (CELEBREX) 200 MG capsule; Take 1-2 capsules (200-400 mg total) by mouth daily as needed for pain.   .Assessment and Plan Assessment & Plan      No follow-ups on file.    Catha Ontko,  PA-C

## 2024-08-03 ENCOUNTER — Ambulatory Visit: Payer: Self-pay | Admitting: Physician Assistant

## 2024-08-03 ENCOUNTER — Encounter: Payer: Self-pay | Admitting: Physician Assistant

## 2024-08-03 ENCOUNTER — Other Ambulatory Visit (HOSPITAL_COMMUNITY): Payer: Self-pay

## 2024-08-03 NOTE — Progress Notes (Signed)
 Shannon [name],   ANA negative.  Inflammatory markers low.  Kidney, liver, glucose look good.  Thyroid  looks good.  A1C to goal.

## 2024-08-05 NOTE — Progress Notes (Signed)
 Lumbar DDD as expected. Pain seems to be coming more from arthritis. Lets see how celebrex works.

## 2024-08-05 NOTE — Progress Notes (Signed)
 Shannon [name],   A1C looks great.  No signs of autoimmune or any inflammation.  Thyroid  looks good.  Kidney and liver look good.

## 2024-08-08 ENCOUNTER — Encounter: Payer: Self-pay | Admitting: Physician Assistant

## 2024-08-08 ENCOUNTER — Telehealth: Payer: Self-pay

## 2024-08-08 ENCOUNTER — Other Ambulatory Visit: Payer: Self-pay | Admitting: Radiology

## 2024-08-08 ENCOUNTER — Ambulatory Visit (INDEPENDENT_AMBULATORY_CARE_PROVIDER_SITE_OTHER): Admitting: Physician Assistant

## 2024-08-08 ENCOUNTER — Other Ambulatory Visit: Payer: Self-pay

## 2024-08-08 DIAGNOSIS — M25531 Pain in right wrist: Secondary | ICD-10-CM

## 2024-08-08 DIAGNOSIS — M1711 Unilateral primary osteoarthritis, right knee: Secondary | ICD-10-CM

## 2024-08-08 NOTE — Telephone Encounter (Signed)
 Copied from CRM [id number]. Topic: Clinical - Lab/Test Results >> Aug 08, 2024  8:04 AM Precious C wrote: Reason for CRM: Pt called in requesting follow up results for her x-Swango on 10/15.   CallbackBlessyn, [place] (Self) [phone]

## 2024-08-08 NOTE — Telephone Encounter (Signed)
 Spoke to patient and informed of x-Cowie results from 08/02/2024. She states the celebrex has helped the pain.

## 2024-08-08 NOTE — Progress Notes (Signed)
 HPI: Shannon [name] returns today for right knee pain.  She states that the gel injection gave her very little relief on 03/07/2024.  She states that she has had better relief from the previous Euflexxa injections.  She has had no new injury to either knee.  She has lost weight.  Her diabetes is under very good control.  She is taking Celebrex 200 mg twice daily and started this last week.  No known injury to the right knee.  Notes the knee pain is described as a toothache pain lateral aspect and posterior aspect of the knee.  Worse when getting up and down from a seated position.  She has been manage she is having right wrist pain has been on and off for the last year no known injury.  Feels like something is coming out of place.  Catches pops at times.  Some weakness.  Said no treatment for this.  Review of systems: See HPI  Physical exam: General Well-developed well-nourished female who ambulates without any assistive device.  Bilateral knees she has good range of motion both knees.  Patellofemoral crepitus left greater than right.  No abnormal warmth erythema or or effusion of either knee.  Right knee only tenderness lateral joint line.  No gross instability of either knee with valgus varus stressing.  Bilateral wrist: Good range of motion without pain.  She has minimal tenderness over the right proximal scaphoid region no tenderness left snuffbox.  Otherwise no tenderness throughout either hand.  No catching locking.  Grind test bilateral thumbs negative.  Finkelstein's negative on the right.  Radiographs: Right wrist 3 views: There is well located.  Mild arthritic changes at the navicular trapezium joint line.  Otherwise no acute fracture or acute findings.  Impression: Right wrist pain Right knee OA  Plan: Will have her use Voltaren gel 2 g times daily over the area of maximal tenderness right wrist.  In regards to her knee we will send her to formal physical therapy to work on range of motion  strengthening particularly quad strengthening knee.  They will include home exercise program and modalities.  Will try to gain approval for Euflexxa for right knee injection in November.  Again she had good results with the Euflexxa and did not get good relief results with the Monovisc.

## 2024-08-09 LAB — CBC WITH DIFFERENTIAL/PLATELET
Basophils Absolute: 0 x10E3/uL (ref 0.0–0.2)
Basos: 0 %
EOS (ABSOLUTE): 0 x10E3/uL (ref 0.0–0.4)
Eos: 0 %
Hematocrit: 44.7 % (ref 34.0–46.6)
Hemoglobin: 14.7 g/dL (ref 11.1–15.9)
Immature Grans (Abs): 0 x10E3/uL (ref 0.0–0.1)
Immature Granulocytes: 0 %
Lymphocytes Absolute: 2.1 x10E3/uL (ref 0.7–3.1)
Lymphs: 30 %
MCH: 31.4 pg (ref 26.6–33.0)
MCHC: 32.9 g/dL (ref 31.5–35.7)
MCV: 96 fL (ref 79–97)
Monocytes Absolute: 0.4 x10E3/uL (ref 0.1–0.9)
Monocytes: 5 %
Neutrophils Absolute: 4.4 x10E3/uL (ref 1.4–7.0)
Neutrophils: 65 %
Platelets: 407 x10E3/uL (ref 150–450)
RBC: 4.68 x10E6/uL (ref 3.77–5.28)
RDW: 12.8 % (ref 11.7–15.4)
WBC: 6.9 x10E3/uL (ref 3.4–10.8)

## 2024-08-09 LAB — ANA,IFA RA DIAG PNL W/RFLX TIT/PATN
ANA Titer 1: NEGATIVE
Cyclic Citrullin Peptide Ab: 9 U (ref 0–19)
Rheumatoid fact SerPl-aCnc: <10 [IU]/mL (ref <14.0)

## 2024-08-09 LAB — CMP14+EGFR
ALT: 12 IU/L (ref 0–32)
AST: 15 IU/L (ref 0–40)
Albumin: 4.5 g/dL (ref 3.8–4.9)
Alkaline Phosphatase: 107 IU/L (ref 49–135)
BUN/Creatinine Ratio: 23 (ref 9–23)
BUN: 18 mg/dL (ref 6–24)
Bilirubin Total: <0.2 mg/dL (ref 0.0–1.2)
CO2: 22 mmol/L (ref 20–29)
Calcium: 9.4 mg/dL (ref 8.7–10.2)
Chloride: 104 mmol/L (ref 96–106)
Creatinine, Ser: 0.77 mg/dL (ref 0.57–1.00)
Globulin, Total: 2.1 g/dL (ref 1.5–4.5)
Glucose: 95 mg/dL (ref 70–99)
Potassium: 4.6 mmol/L (ref 3.5–5.2)
Sodium: 142 mmol/L (ref 134–144)
Total Protein: 6.6 g/dL (ref 6.0–8.5)
eGFR: 90 mL/min/1.73 (ref >59)

## 2024-08-09 LAB — C-REACTIVE PROTEIN: CRP: <1 mg/L (ref 0–10)

## 2024-08-09 LAB — TSH+FREE T4
Free T4: 0.84 ng/dL (ref 0.82–1.77)
TSH: 1.07 u[IU]/mL (ref 0.450–4.500)

## 2024-08-09 LAB — HLA-B27 ANTIGEN

## 2024-08-09 LAB — SEDIMENTATION RATE: Sed Rate: 2 mm/h (ref 0–40)

## 2024-08-09 LAB — HEMOGLOBIN A1C
Est. average glucose Bld gHb Est-mCnc: 103 mg/dL
Hgb A1c MFr Bld: 5.2 % (ref 4.8–5.6)

## 2024-08-10 ENCOUNTER — Other Ambulatory Visit (HOSPITAL_COMMUNITY): Payer: Self-pay | Admitting: Psychiatry

## 2024-08-11 ENCOUNTER — Other Ambulatory Visit: Payer: Self-pay

## 2024-08-11 MED ORDER — VILAZODONE HCL 10 MG PO TABS
10.0000 mg | ORAL_TABLET | Freq: Every day | ORAL | 1 refills | Status: DC
  Filled 2024-08-11: qty 30, 30d supply, fill #0
  Filled 2024-09-21 – 2024-09-26 (×2): qty 30, 30d supply, fill #1

## 2024-08-15 ENCOUNTER — Other Ambulatory Visit: Payer: Self-pay | Admitting: Physician Assistant

## 2024-08-15 DIAGNOSIS — E1121 Type 2 diabetes mellitus with diabetic nephropathy: Secondary | ICD-10-CM

## 2024-08-16 ENCOUNTER — Other Ambulatory Visit: Payer: Self-pay

## 2024-08-17 MED ORDER — OZEMPIC (2 MG/DOSE) 8 MG/3ML ~~LOC~~ SOPN
2.0000 mg | PEN_INJECTOR | SUBCUTANEOUS | 0 refills | Status: DC
  Filled 2024-08-17 – 2024-08-19 (×2): qty 9, 84d supply, fill #0

## 2024-08-18 ENCOUNTER — Other Ambulatory Visit (HOSPITAL_COMMUNITY): Payer: Self-pay

## 2024-08-18 ENCOUNTER — Encounter (HOSPITAL_COMMUNITY): Payer: Self-pay

## 2024-08-19 ENCOUNTER — Other Ambulatory Visit (HOSPITAL_COMMUNITY): Payer: Self-pay

## 2024-08-19 ENCOUNTER — Other Ambulatory Visit: Payer: Self-pay

## 2024-08-22 ENCOUNTER — Other Ambulatory Visit: Payer: Self-pay | Admitting: Physician Assistant

## 2024-08-22 ENCOUNTER — Encounter: Payer: Self-pay | Admitting: Radiology

## 2024-08-22 ENCOUNTER — Other Ambulatory Visit: Payer: Self-pay

## 2024-08-22 DIAGNOSIS — E559 Vitamin D deficiency, unspecified: Secondary | ICD-10-CM

## 2024-08-22 DIAGNOSIS — M7918 Myalgia, other site: Secondary | ICD-10-CM

## 2024-08-25 ENCOUNTER — Other Ambulatory Visit: Payer: Self-pay

## 2024-08-26 ENCOUNTER — Other Ambulatory Visit (HOSPITAL_BASED_OUTPATIENT_CLINIC_OR_DEPARTMENT_OTHER): Payer: Self-pay

## 2024-08-26 MED ORDER — PREGABALIN 100 MG PO CAPS
100.0000 mg | ORAL_CAPSULE | Freq: Two times a day (BID) | ORAL | 1 refills | Status: AC
  Filled 2024-08-26: qty 180, 90d supply, fill #0

## 2024-08-26 MED ORDER — VITAMIN D (ERGOCALCIFEROL) 1.25 MG (50000 UNIT) PO CAPS
50000.0000 [IU] | ORAL_CAPSULE | ORAL | 1 refills | Status: AC
  Filled 2024-08-26: qty 12, 84d supply, fill #0
  Filled 2024-11-22: qty 12, 84d supply, fill #1

## 2024-08-26 NOTE — Telephone Encounter (Signed)
 

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
## 2024-09-07 ENCOUNTER — Ambulatory Visit: Admitting: Rehabilitative and Restorative Service Providers"

## 2024-09-21 ENCOUNTER — Other Ambulatory Visit (HOSPITAL_COMMUNITY): Payer: Self-pay

## 2024-09-21 ENCOUNTER — Other Ambulatory Visit (HOSPITAL_COMMUNITY): Payer: Self-pay | Admitting: Psychiatry

## 2024-09-21 ENCOUNTER — Other Ambulatory Visit: Payer: Self-pay

## 2024-09-21 ENCOUNTER — Encounter: Payer: Self-pay | Admitting: Pharmacist

## 2024-09-21 MED ORDER — LAMOTRIGINE 200 MG PO TABS
200.0000 mg | ORAL_TABLET | Freq: Every day | ORAL | 0 refills | Status: DC
  Filled 2024-09-21: qty 30, 30d supply, fill #0

## 2024-09-21 MED ORDER — LAMOTRIGINE 25 MG PO TABS
50.0000 mg | ORAL_TABLET | Freq: Every day | ORAL | 0 refills | Status: DC
  Filled 2024-09-21 – 2024-09-28 (×2): qty 60, 30d supply, fill #0

## 2024-09-22 ENCOUNTER — Encounter: Payer: Self-pay | Admitting: Pharmacist

## 2024-09-22 ENCOUNTER — Other Ambulatory Visit: Payer: Self-pay

## 2024-09-22 ENCOUNTER — Other Ambulatory Visit (HOSPITAL_COMMUNITY): Payer: Self-pay

## 2024-09-24 ENCOUNTER — Other Ambulatory Visit (HOSPITAL_COMMUNITY): Payer: Self-pay | Admitting: Psychiatry

## 2024-09-26 ENCOUNTER — Other Ambulatory Visit: Payer: Self-pay

## 2024-09-26 ENCOUNTER — Other Ambulatory Visit (HOSPITAL_COMMUNITY): Payer: Self-pay

## 2024-09-26 ENCOUNTER — Other Ambulatory Visit (HOSPITAL_COMMUNITY): Payer: Self-pay | Admitting: Psychiatry

## 2024-09-26 DIAGNOSIS — F331 Major depressive disorder, recurrent, moderate: Secondary | ICD-10-CM

## 2024-09-26 MED ORDER — LAMOTRIGINE 200 MG PO TABS
200.0000 mg | ORAL_TABLET | Freq: Every day | ORAL | 0 refills | Status: DC
  Filled 2024-09-26 – 2024-10-19 (×2): qty 30, 30d supply, fill #0

## 2024-09-27 ENCOUNTER — Other Ambulatory Visit (HOSPITAL_COMMUNITY): Payer: Self-pay

## 2024-09-27 ENCOUNTER — Other Ambulatory Visit (HOSPITAL_BASED_OUTPATIENT_CLINIC_OR_DEPARTMENT_OTHER): Payer: Self-pay

## 2024-09-27 ENCOUNTER — Other Ambulatory Visit: Payer: Self-pay

## 2024-09-27 MED ORDER — CITALOPRAM HYDROBROMIDE 20 MG PO TABS
20.0000 mg | ORAL_TABLET | Freq: Every day | ORAL | 1 refills | Status: DC
  Filled 2024-09-27: qty 30, 30d supply, fill #0
  Filled 2024-10-31: qty 30, 30d supply, fill #1

## 2024-09-28 ENCOUNTER — Other Ambulatory Visit (HOSPITAL_BASED_OUTPATIENT_CLINIC_OR_DEPARTMENT_OTHER): Payer: Self-pay

## 2024-09-29 ENCOUNTER — Other Ambulatory Visit: Payer: Self-pay

## 2024-10-04 ENCOUNTER — Other Ambulatory Visit (HOSPITAL_COMMUNITY): Payer: Self-pay | Admitting: Psychiatry

## 2024-10-04 ENCOUNTER — Other Ambulatory Visit (HOSPITAL_BASED_OUTPATIENT_CLINIC_OR_DEPARTMENT_OTHER): Payer: Self-pay

## 2024-10-04 DIAGNOSIS — F410 Panic disorder [episodic paroxysmal anxiety] without agoraphobia: Secondary | ICD-10-CM

## 2024-10-04 DIAGNOSIS — F411 Generalized anxiety disorder: Secondary | ICD-10-CM

## 2024-10-04 MED ORDER — ALPRAZOLAM 0.5 MG PO TABS
0.5000 mg | ORAL_TABLET | Freq: Every day | ORAL | 1 refills | Status: AC | PRN
  Filled 2024-10-04: qty 30, 30d supply, fill #0

## 2024-10-19 ENCOUNTER — Other Ambulatory Visit (HOSPITAL_BASED_OUTPATIENT_CLINIC_OR_DEPARTMENT_OTHER): Payer: Self-pay

## 2024-10-19 ENCOUNTER — Other Ambulatory Visit (HOSPITAL_COMMUNITY): Payer: Self-pay

## 2024-10-19 ENCOUNTER — Other Ambulatory Visit (HOSPITAL_COMMUNITY): Payer: Self-pay | Admitting: Psychiatry

## 2024-10-19 ENCOUNTER — Other Ambulatory Visit: Payer: Self-pay

## 2024-10-19 MED ORDER — LAMOTRIGINE 25 MG PO TABS
50.0000 mg | ORAL_TABLET | Freq: Every day | ORAL | 0 refills | Status: AC
  Filled 2024-10-19 – 2024-11-11 (×3): qty 60, 30d supply, fill #0

## 2024-10-21 ENCOUNTER — Other Ambulatory Visit: Payer: Self-pay

## 2024-10-21 ENCOUNTER — Other Ambulatory Visit (HOSPITAL_COMMUNITY): Payer: Self-pay

## 2024-10-22 ENCOUNTER — Other Ambulatory Visit (HOSPITAL_COMMUNITY): Payer: Self-pay

## 2024-10-24 ENCOUNTER — Other Ambulatory Visit: Payer: Self-pay

## 2024-10-25 ENCOUNTER — Other Ambulatory Visit (HOSPITAL_COMMUNITY): Payer: Self-pay | Admitting: Psychiatry

## 2024-10-25 ENCOUNTER — Other Ambulatory Visit: Payer: Self-pay | Admitting: Physician Assistant

## 2024-10-25 ENCOUNTER — Other Ambulatory Visit (HOSPITAL_COMMUNITY): Payer: Self-pay

## 2024-10-25 DIAGNOSIS — N3281 Overactive bladder: Secondary | ICD-10-CM

## 2024-10-25 MED ORDER — VILAZODONE HCL 10 MG PO TABS
10.0000 mg | ORAL_TABLET | Freq: Every day | ORAL | 1 refills | Status: DC
  Filled 2024-10-25: qty 30, 30d supply, fill #0
  Filled 2024-11-22: qty 30, 30d supply, fill #1

## 2024-10-26 ENCOUNTER — Other Ambulatory Visit (HOSPITAL_COMMUNITY): Payer: Self-pay

## 2024-10-26 ENCOUNTER — Other Ambulatory Visit: Payer: Self-pay

## 2024-10-26 MED ORDER — MIRABEGRON ER 50 MG PO TB24
50.0000 mg | ORAL_TABLET | Freq: Every day | ORAL | 0 refills | Status: AC
  Filled 2024-10-26: qty 90, 90d supply, fill #0

## 2024-11-03 ENCOUNTER — Other Ambulatory Visit (HOSPITAL_COMMUNITY): Payer: Self-pay

## 2024-11-04 ENCOUNTER — Other Ambulatory Visit (HOSPITAL_COMMUNITY): Payer: Self-pay

## 2024-11-07 ENCOUNTER — Other Ambulatory Visit: Payer: Self-pay

## 2024-11-08 ENCOUNTER — Other Ambulatory Visit (HOSPITAL_BASED_OUTPATIENT_CLINIC_OR_DEPARTMENT_OTHER): Payer: Self-pay

## 2024-11-08 ENCOUNTER — Other Ambulatory Visit: Payer: Self-pay | Admitting: Physician Assistant

## 2024-11-08 DIAGNOSIS — E1121 Type 2 diabetes mellitus with diabetic nephropathy: Secondary | ICD-10-CM

## 2024-11-08 MED ORDER — OZEMPIC (2 MG/DOSE) 8 MG/3ML ~~LOC~~ SOPN
2.0000 mg | PEN_INJECTOR | SUBCUTANEOUS | 0 refills | Status: AC
  Filled 2024-11-08: qty 3, 28d supply, fill #0

## 2024-11-11 ENCOUNTER — Other Ambulatory Visit: Payer: Self-pay

## 2024-11-11 ENCOUNTER — Other Ambulatory Visit (HOSPITAL_BASED_OUTPATIENT_CLINIC_OR_DEPARTMENT_OTHER): Payer: Self-pay

## 2024-11-22 ENCOUNTER — Other Ambulatory Visit: Payer: Self-pay

## 2024-11-22 ENCOUNTER — Other Ambulatory Visit (HOSPITAL_COMMUNITY): Payer: Self-pay | Admitting: Psychiatry

## 2024-11-22 ENCOUNTER — Other Ambulatory Visit (HOSPITAL_COMMUNITY): Payer: Self-pay

## 2024-11-22 LAB — OPHTHALMOLOGY REPORT-SCANNED

## 2024-11-22 MED ORDER — VILAZODONE HCL 10 MG PO TABS
10.0000 mg | ORAL_TABLET | Freq: Every day | ORAL | 0 refills | Status: AC
  Filled 2024-11-22: qty 90, 90d supply, fill #0

## 2024-11-22 MED ORDER — LAMOTRIGINE 200 MG PO TABS
200.0000 mg | ORAL_TABLET | Freq: Every day | ORAL | 0 refills | Status: DC
  Filled 2024-11-22: qty 30, 30d supply, fill #0

## 2024-11-22 MED ORDER — LAMOTRIGINE 200 MG PO TABS
200.0000 mg | ORAL_TABLET | Freq: Every day | ORAL | 0 refills | Status: AC
  Filled 2024-11-22: qty 90, 90d supply, fill #0

## 2024-11-23 ENCOUNTER — Other Ambulatory Visit: Payer: Self-pay

## 2024-11-24 ENCOUNTER — Other Ambulatory Visit (HOSPITAL_COMMUNITY): Payer: Self-pay | Admitting: Psychiatry

## 2024-11-24 DIAGNOSIS — F331 Major depressive disorder, recurrent, moderate: Secondary | ICD-10-CM

## 2024-11-25 ENCOUNTER — Other Ambulatory Visit: Payer: Self-pay

## 2024-11-25 ENCOUNTER — Other Ambulatory Visit (HOSPITAL_COMMUNITY): Payer: Self-pay | Admitting: Psychiatry

## 2024-11-25 ENCOUNTER — Other Ambulatory Visit (HOSPITAL_COMMUNITY): Payer: Self-pay

## 2024-11-25 DIAGNOSIS — F331 Major depressive disorder, recurrent, moderate: Secondary | ICD-10-CM

## 2024-11-25 MED ORDER — CITALOPRAM HYDROBROMIDE 20 MG PO TABS
20.0000 mg | ORAL_TABLET | Freq: Every day | ORAL | 1 refills | Status: AC
  Filled 2024-11-25: qty 30, 30d supply, fill #0

## 2024-12-01 ENCOUNTER — Telehealth (HOSPITAL_COMMUNITY): Admitting: Psychiatry

## 2024-12-07 ENCOUNTER — Telehealth (HOSPITAL_COMMUNITY): Admitting: Psychiatry
